# Patient Record
Sex: Male | Born: 1960 | Hispanic: No | Smoking: Former smoker
Health system: Southern US, Community
[De-identification: ages and names within clinical notes are randomized; demographics above are authoritative.]

## PROBLEM LIST (undated history)

## (undated) DIAGNOSIS — I10 Essential (primary) hypertension: Secondary | ICD-10-CM

## (undated) DIAGNOSIS — I313 Pericardial effusion (noninflammatory): Secondary | ICD-10-CM

## (undated) DIAGNOSIS — N529 Male erectile dysfunction, unspecified: Secondary | ICD-10-CM

## (undated) DIAGNOSIS — E785 Hyperlipidemia, unspecified: Secondary | ICD-10-CM

## (undated) DIAGNOSIS — E119 Type 2 diabetes mellitus without complications: Secondary | ICD-10-CM

## (undated) DIAGNOSIS — I3139 Other pericardial effusion (noninflammatory): Secondary | ICD-10-CM

## (undated) DIAGNOSIS — R911 Solitary pulmonary nodule: Secondary | ICD-10-CM

## (undated) DIAGNOSIS — K759 Inflammatory liver disease, unspecified: Secondary | ICD-10-CM

## (undated) DIAGNOSIS — Z720 Tobacco use: Secondary | ICD-10-CM

## (undated) DIAGNOSIS — D649 Anemia, unspecified: Secondary | ICD-10-CM

## (undated) DIAGNOSIS — D72829 Elevated white blood cell count, unspecified: Secondary | ICD-10-CM

## (undated) DIAGNOSIS — I2510 Atherosclerotic heart disease of native coronary artery without angina pectoris: Secondary | ICD-10-CM

## (undated) DIAGNOSIS — N186 End stage renal disease: Secondary | ICD-10-CM

## (undated) HISTORY — DX: Other pericardial effusion (noninflammatory): I31.39

## (undated) HISTORY — PX: APPENDECTOMY: SHX54

## (undated) HISTORY — DX: Anemia, unspecified: D64.9

## (undated) HISTORY — DX: Elevated white blood cell count, unspecified: D72.829

## (undated) HISTORY — DX: Solitary pulmonary nodule: R91.1

## (undated) HISTORY — DX: Atherosclerotic heart disease of native coronary artery without angina pectoris: I25.10

## (undated) HISTORY — DX: Pericardial effusion (noninflammatory): I31.3

---
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
## 2001-02-17 ENCOUNTER — Ambulatory Visit (HOSPITAL_COMMUNITY): Admission: RE | Admit: 2001-02-17 | Discharge: 2001-02-17 | Payer: Self-pay | Admitting: Family Medicine

## 2001-02-17 ENCOUNTER — Encounter: Payer: Self-pay | Admitting: Family Medicine

## 2009-05-09 ENCOUNTER — Ambulatory Visit: Payer: Self-pay | Admitting: Internal Medicine

## 2009-05-09 ENCOUNTER — Inpatient Hospital Stay (HOSPITAL_COMMUNITY): Admission: EM | Admit: 2009-05-09 | Discharge: 2009-05-13 | Payer: Self-pay | Admitting: Emergency Medicine

## 2009-05-09 ENCOUNTER — Ambulatory Visit: Payer: Self-pay | Admitting: *Deleted

## 2009-05-09 ENCOUNTER — Encounter (INDEPENDENT_AMBULATORY_CARE_PROVIDER_SITE_OTHER): Payer: Self-pay | Admitting: Internal Medicine

## 2009-05-10 ENCOUNTER — Encounter: Payer: Self-pay | Admitting: Internal Medicine

## 2009-05-12 HISTORY — PX: CARDIAC CATHETERIZATION: SHX172

## 2009-05-13 ENCOUNTER — Encounter: Payer: Self-pay | Admitting: Internal Medicine

## 2009-05-20 ENCOUNTER — Ambulatory Visit: Payer: Self-pay | Admitting: Internal Medicine

## 2009-05-20 ENCOUNTER — Encounter: Payer: Self-pay | Admitting: Internal Medicine

## 2009-05-20 DIAGNOSIS — K5900 Constipation, unspecified: Secondary | ICD-10-CM | POA: Insufficient documentation

## 2009-05-20 DIAGNOSIS — I10 Essential (primary) hypertension: Secondary | ICD-10-CM

## 2009-05-20 DIAGNOSIS — F17200 Nicotine dependence, unspecified, uncomplicated: Secondary | ICD-10-CM | POA: Insufficient documentation

## 2009-05-20 DIAGNOSIS — E785 Hyperlipidemia, unspecified: Secondary | ICD-10-CM | POA: Insufficient documentation

## 2009-05-22 DIAGNOSIS — E1129 Type 2 diabetes mellitus with other diabetic kidney complication: Secondary | ICD-10-CM

## 2009-06-23 ENCOUNTER — Encounter: Payer: Self-pay | Admitting: Internal Medicine

## 2009-06-23 ENCOUNTER — Ambulatory Visit: Payer: Self-pay | Admitting: Internal Medicine

## 2009-06-23 DIAGNOSIS — F528 Other sexual dysfunction not due to a substance or known physiological condition: Secondary | ICD-10-CM

## 2009-06-23 LAB — CONVERTED CEMR LAB: Blood Glucose, Fingerstick: 141

## 2009-06-24 LAB — CONVERTED CEMR LAB
BUN: 20 mg/dL (ref 6–23)
CO2: 21 meq/L (ref 19–32)
Calcium: 9.7 mg/dL (ref 8.4–10.5)
Chloride: 106 meq/L (ref 96–112)
Cholesterol: 224 mg/dL — ABNORMAL HIGH (ref 0–200)
Creatinine, Ser: 1.15 mg/dL (ref 0.40–1.50)
Creatinine, Urine: 251.5 mg/dL
Glucose, Bld: 169 mg/dL — ABNORMAL HIGH (ref 70–99)
HDL: 30 mg/dL — ABNORMAL LOW (ref >39)
Microalb Creat Ratio: 22 mg/g (ref 0.0–30.0)
Microalb, Ur: 5.54 mg/dL — ABNORMAL HIGH (ref 0.00–1.89)
Potassium: 4.2 meq/L (ref 3.5–5.3)
Sodium: 136 meq/L (ref 135–145)
Testosterone: 661.63 ng/dL (ref 350–890)
Total CHOL/HDL Ratio: 7.5
Triglycerides: 407 mg/dL — ABNORMAL HIGH (ref <150)

## 2010-12-06 DIAGNOSIS — N529 Male erectile dysfunction, unspecified: Secondary | ICD-10-CM

## 2010-12-06 DIAGNOSIS — Z720 Tobacco use: Secondary | ICD-10-CM

## 2010-12-06 DIAGNOSIS — E785 Hyperlipidemia, unspecified: Secondary | ICD-10-CM

## 2010-12-06 HISTORY — DX: Male erectile dysfunction, unspecified: N52.9

## 2010-12-06 HISTORY — DX: Tobacco use: Z72.0

## 2010-12-06 HISTORY — DX: Hyperlipidemia, unspecified: E78.5

## 2011-03-14 LAB — GLUCOSE, CAPILLARY: Glucose-Capillary: 141 mg/dL — ABNORMAL HIGH (ref 70–99)

## 2011-03-15 LAB — GLUCOSE, CAPILLARY
Glucose-Capillary: 142 mg/dL — ABNORMAL HIGH (ref 70–99)
Glucose-Capillary: 168 mg/dL — ABNORMAL HIGH (ref 70–99)
Glucose-Capillary: 169 mg/dL — ABNORMAL HIGH (ref 70–99)
Glucose-Capillary: 214 mg/dL — ABNORMAL HIGH (ref 70–99)
Glucose-Capillary: 214 mg/dL — ABNORMAL HIGH (ref 70–99)
Glucose-Capillary: 219 mg/dL — ABNORMAL HIGH (ref 70–99)
Glucose-Capillary: 238 mg/dL — ABNORMAL HIGH (ref 70–99)
Glucose-Capillary: 260 mg/dL — ABNORMAL HIGH (ref 70–99)
Glucose-Capillary: 277 mg/dL — ABNORMAL HIGH (ref 70–99)
Glucose-Capillary: 297 mg/dL — ABNORMAL HIGH (ref 70–99)

## 2011-03-15 LAB — CBC
HCT: 46.3 % (ref 39.0–52.0)
Hemoglobin: 16 g/dL (ref 13.0–17.0)
MCHC: 34.5 g/dL (ref 30.0–36.0)
Platelets: 235 10*3/uL (ref 150–400)
RBC: 5.13 MIL/uL (ref 4.22–5.81)
RBC: 5.16 MIL/uL (ref 4.22–5.81)
RBC: 5.34 MIL/uL (ref 4.22–5.81)
RDW: 13 % (ref 11.5–15.5)
WBC: 9.7 10*3/uL (ref 4.0–10.5)
WBC: 9.8 10*3/uL (ref 4.0–10.5)
WBC: 9.8 10*3/uL (ref 4.0–10.5)

## 2011-03-15 LAB — HEPATITIS B SURFACE ANTIBODY,QUALITATIVE: Hep B S Ab: NEGATIVE

## 2011-03-15 LAB — BASIC METABOLIC PANEL
BUN: 10 mg/dL (ref 6–23)
BUN: 12 mg/dL (ref 6–23)
CO2: 23 mEq/L (ref 19–32)
Calcium: 8.9 mg/dL (ref 8.4–10.5)
Calcium: 9 mg/dL (ref 8.4–10.5)
Calcium: 9.3 mg/dL (ref 8.4–10.5)
Calcium: 9.4 mg/dL (ref 8.4–10.5)
Chloride: 98 mEq/L (ref 96–112)
Creatinine, Ser: 0.71 mg/dL (ref 0.4–1.5)
Creatinine, Ser: 0.86 mg/dL (ref 0.4–1.5)
Creatinine, Ser: 0.89 mg/dL (ref 0.4–1.5)
Creatinine, Ser: 0.89 mg/dL (ref 0.4–1.5)
GFR calc Af Amer: >60 mL/min (ref >60)
GFR calc Af Amer: >60 mL/min (ref >60)
GFR calc non Af Amer: >60 mL/min (ref >60)
GFR calc non Af Amer: >60 mL/min (ref >60)
Glucose, Bld: 209 mg/dL — ABNORMAL HIGH (ref 70–99)
Glucose, Bld: 316 mg/dL — ABNORMAL HIGH (ref 70–99)
Potassium: 4.1 mEq/L (ref 3.5–5.1)
Sodium: 131 mEq/L — ABNORMAL LOW (ref 135–145)
Sodium: 141 mEq/L (ref 135–145)

## 2011-03-15 LAB — HEPATITIS C ANTIBODY: HCV Ab: NEGATIVE

## 2011-03-15 LAB — CARDIAC PANEL(CRET KIN+CKTOT+MB+TROPI)
CK, MB: 2.2 ng/mL (ref 0.3–4.0)
CK, MB: 2.8 ng/mL (ref 0.3–4.0)
Relative Index: 2 (ref 0.0–2.5)
Total CK: 115 U/L (ref 7–232)

## 2011-03-15 LAB — HEPATIC FUNCTION PANEL
ALT: 20 U/L (ref 0–53)
Alkaline Phosphatase: 81 U/L (ref 39–117)
Bilirubin, Direct: 0.3 mg/dL (ref 0.0–0.3)
Indirect Bilirubin: 0.3 mg/dL (ref 0.3–0.9)

## 2011-03-15 LAB — CK TOTAL AND CKMB (NOT AT ARMC)
CK, MB: 4 ng/mL (ref 0.3–4.0)
Relative Index: 2 (ref 0.0–2.5)

## 2011-03-15 LAB — DIFFERENTIAL
Basophils Absolute: 0.1 10*3/uL (ref 0.0–0.1)
Basophils Relative: 1 % (ref 0–1)
Eosinophils Relative: 5 % (ref 0–5)
Lymphocytes Relative: 33 % (ref 12–46)
Lymphs Abs: 2.6 10*3/uL (ref 0.7–4.0)
Monocytes Relative: 6 % (ref 3–12)
Neutro Abs: 6.1 10*3/uL (ref 1.7–7.7)
Neutrophils Relative %: 59 % (ref 43–77)
Neutrophils Relative %: 62 % (ref 43–77)

## 2011-03-15 LAB — LIPASE, BLOOD: Lipase: 47 U/L (ref 11–59)

## 2011-03-15 LAB — LIPID PANEL
HDL: 21 mg/dL — ABNORMAL LOW (ref >39)
Total CHOL/HDL Ratio: 11.3 RATIO
Triglycerides: 301 mg/dL — ABNORMAL HIGH (ref <150)
VLDL: 60 mg/dL — ABNORMAL HIGH (ref 0–40)

## 2011-03-15 LAB — TROPONIN I: Troponin I: 0.02 ng/mL (ref 0.00–0.06)

## 2011-04-14 ENCOUNTER — Encounter: Payer: Self-pay | Admitting: Internal Medicine

## 2011-04-20 NOTE — Consult Note (Signed)
Fernando Jones, Fernando Jones NO.:  [id number]   MEDICAL RECORD NO.:  [id number]          PATIENT TYPE:  INP   LOCATION:  4702                         FACILITY:  [facility]   PHYSICIAN:  Deboraha Sprang, MD, FACCDATE OF BIRTH:  11-Mar-1961   DATE OF CONSULTATION:  05/10/2009  DATE OF DISCHARGE:                                 CONSULTATION   REASON FOR CONSULTATION:  Chest pain.   PRIMARY CARDIOLOGIST:  Will be new, Deboraha Sprang, MD, [facility]   PRIMARY CARE PHYSICIAN:  The patient does not have one.   HISTORY OF PRESENT ILLNESS:  This is a 50 year old [place] male with no  prior history of CAD but with a history of hypertension and diabetes  along with an episode of hepatitis as a child, who was admitted through  family practice teaching service with complaints of chest pain, which  awakened him.  He described it as constant pressure, worse with  palpation, midsternal and epigastric   Over the 2 months, he has been noticing increasing dyspnea on exertion,  walking up to his third floor apartment.  He usually does not have  problems doing so but over the last couple of weeks, he has been.  He is  also admitted to not sleeping well and awakens feeling shaky and this  has been going on for about 4 days.  He is feeling a lot more tired over  the last few days, sleeping a lot, and feels like he is unstable on his  feet.   At this time, the patient is without complaints of chest pain.  He is on  nitroglycerin and heparin.  He just feels exhausted.  He continues to  sleep a lot.  On further evaluation, the patient admits to snoring and  his fiancee, who lives with him, thinks that he has stopped breathing  sometimes.  At this time, the patient is stable.   REVIEW OF SYSTEMS:  Positive for chest pain, shortness of breath,  snoring and generalized fatigue, weakness, and photophobia.  All other  systems reviewed and found to be negative.   PAST MEDICAL HISTORY:  Hypertension x8  years, diabetes x8 years,  hepatitis at age 58, diabetic neuropathy symptoms.  Echocardiogram  completed during this admission reveals an EF of 55-60% with normal wall  thickness   PAST SURGICAL HISTORY:  Appendectomy.   SOCIAL HISTORY:  He lives in [place] with his fiancee.  He repairs  computers.  His daughter is an MD in [place].  He is a 40-pack year  smoker, currently at one and one-half packs a day.  Occasional alcohol.  No drug use.   FAMILY HISTORY:  Mother deceased from complications of CHF.  He has a  father in good health and siblings with diabetes.   CURRENT MEDICATIONS:  1. Aspirin 325 daily.  2. Protonix 40 mg daily.  3. Lipitor 40 mg daily.  4. Lantus insulin 5 units daily.  5. Metoprolol 25 mg b.i.d.  6. Heparin and nitroglycerin drip.  At home the patient is taking:  1. Captopril 20 mg a day.  2. Glipizide 5  mg a day.  3. Metformin 850 mg daily.   ALLERGIES:  No known drug allergies.   CURRENT LABORATORY DATA:  Troponin 0.02, 0.02, and 0.01 respectively.  Sodium 136, potassium 4.0, chloride 105, CO2 23, BUN 13, creatinine  0.71, glucose 261.  Hemoglobin 15.4, hematocrit 44.8, white blood cells  9.8, platelets 237.  TSH 1.387.  Hemoglobin A1c 10.1.  Total cholesterol  238, triglycerides 301, HDL 21, LDL 157.   CARDIOVASCULAR RISK FACTORS:  1. Diabetes.  2. Hypertension.  3. Cholesterol family history.  4. Ongoing smoking.   EKG revealing normal sinus rhythm with T-wave flattening in V1, V5, V6,  and aVF, rate of 75 beats per minute, normal sinus rhythm.  Chest x-ray no acute cardiopulmonary process.   PHYSICAL EXAMINATION:  VITAL SIGNS:  Blood pressure 126/85, pulse 68,  respirations 20, temperature 98.2, O2 sat 96% on room air.  GENERAL:  He is awake, alert, and oriented.  Affect is pleasant.  HEENT:  Head is normocephalic and atraumatic.  Eyes, PERRLA.  Mucous  membranes mouth pink and moist.  Tongue is midline.  NECK:  Supple.  No JVD.  No  carotid bruits appreciated.  CARDIOVASCULAR:  Regular rate and rhythm without murmurs, rubs, or  gallops.  Pulses are 2+ and equal without bruits, femoral and radial.  LUNGS:  Clear to auscultation without wheezes, rales, or rhonchi.  ABDOMEN:  Soft, nontender, 2+ bowel sounds.  EXTREMITIES:  Without clubbing, cyanosis, or edema.  NEUROLOGIC:  Cranial nerves II through XII are grossly intact.   IMPRESSION:  1. Chest pain with multiple cardiovascular risk factors, rule out      coronary artery disease.  2. Diabetes, now well controlled.  3. Hypertension.  4. Rule out sleep apnea.  5. Hypercholesterolemia.   PLAN:  This is a 50 year old [place] male with known history of  diabetes, hypertension with multiple cardiovascular risk factors,  admitted with chest pain and generalized weakness, fatigue, and  shortness of breath with exertion.  EKG reveals lateral T-wave  flattening, but he does have normal cardiac enzymes.  Echocardiogram  revealing normal LV function.   The patient has been seen and examined by myself and Dr. Virl Axe.  The patient has a high pretest probability, so we think a cardiac  catheterization is more suitable than noninvasive imaging.  I have  reviewed this with the patient to include risks and benefits, and he  agrees to proceed.  We will have more intensive treatment for diabetes  through primary care for long-term treatment.   Our plan for cardiac catheterization will be completed on Monday, May 12, 2009.  We would recommend sleep study, ACE inhibitor for renal  protection, and blood pressure control.   On behalf the physicians and providers of [facility], we would  like to thank family practice teaching service for allowing [place] to  participate in the care of this patient.      Phill Myron. Purcell Nails, NP      Deboraha Sprang, MD, [facility]  Electronically Signed    KML/MEDQ  D:  05/10/2009  T:  05/11/2009  Job:  [id number]

## 2011-04-20 NOTE — Cardiovascular Report (Signed)
NAMESTANDLY, SHAMES NO.:  [id number]   MEDICAL RECORD NO.:  [id number]          PATIENT TYPE:  INP   LOCATION:  4702                         FACILITY:  [facility]   PHYSICIAN:  Shaune Pascal. Bensimhon, MDDATE OF BIRTH:  1961-11-04   DATE OF PROCEDURE:  05/12/2009  DATE OF DISCHARGE:                            CARDIAC CATHETERIZATION   INDICATIONS:  [name] is a 50 year old male with a history of  hepatitis, hypertension, diabetes, and ongoing tobacco use.  He was  admitted with chest pain.  His EKG had some mild T-wave flattening.  His  cardiac enzymes have been normal.  He was seen in consult by Dr. Caryl Comes  and referred for catheterization.   PROCEDURES PERFORMED:  1. Selective coronary angiography.  2. Left heart catheterization.  3. Left ventriculogram.  4. Abdominal aortogram.  5. Attempted StarClose femoral artery closure, which failed.   DESCRIPTION OF PROCEDURE:  The risks and indication of catheterization  were explained.  Consent was signed and placed on the chart.  A 5-French  arterial sheath was placed in the right femoral artery using a modified  Seldinger technique.  Standard catheters including a JL-4 and JR-4  angled pigtail were used for procedure.  All catheter exchanges were  made over the wire.  There were no apparent complications.  At the end  of the procedure, we attempted to deploy a StarClose femoral artery  closure device, this failed.  He did develop hematoma in the area and  manual pressure was held.  There was good hemostasis.   Central aortic pressure was 131/73 with a mean of 97.  LV pressure 132/2  and EDP of 5.  There was no aortic stenosis on pullback across the  valve.   Left main was normal.   LAD was a long vessel coursing to the apex.  It gave off two diagonal  branches.  In the ostial LAD, there was approximately 40% focal  stenosis.  In the mid LAD between the two diagonal branches, there was a  50% stenosis.   Left  circumflex gave off a tiny OM 1 and moderate-sized OM 2 and OM 3.  There was also a small ramus branch.  It was angiographically normal.   Right coronary artery was a large dominant vessel, a large PDA and two  posterolaterals that was angiographically normal.   Left ventriculogram done in the RAO position showed an EF of 65% with no  regional wall motion abnormalities.   Abdominal aortogram was normal.  There was no evidence of aneurysmal  dilatation.  The renal arteries were widely patent bilaterally.   ASSESSMENT:  1. Nonobstructive coronary artery disease in the left anterior      descending, otherwise normal coronary arteries.  2. Normal left ventricular function.  3. Normal abdominal aorta and renal arteries.  4. Failed StarClose femoral artery closure device with resultant      hematoma, which was controlled with manual pressure.   Plan will be for medical therapy and continued risk factor management.  He will need to stop smoking.  We will watch his groin closely.  If  groin is stable throughout the day, he can go home either later today or  in the morning.      Shaune Pascal. Bensimhon, MD  Electronically Signed     DRB/MEDQ  D:  05/12/2009  T:  05/13/2009  Job:  [id number]

## 2011-04-20 NOTE — Discharge Summary (Signed)
Fernando Jones, Fernando Jones NO.:  [id number]   MEDICAL RECORD NO.:  [id number]          PATIENT TYPE:  OBV   LOCATION:  [id number]                         FACILITY:  [facility] [facility]   PHYSICIAN:  Felicity Pellegrini, MD     DATE OF BIRTH:  [birth year]   DATE OF ADMISSION:  05/09/2009  DATE OF DISCHARGE:  05/13/2009                               DISCHARGE SUMMARY   DISCHARGE DIAGNOSES:  1. Chest pain.  2. Uncontrolled diabetes.  3. Hypertension.  4. Tobacco abuse.  5. Hyperlipidemia.   DISCHARGE MEDICATIONS:  1. Glipizide 10 mg 1 tablet p.o. b.i.d.  2. Metformin 1000 mg 1 tablet p.o. b.i.d.  3. Lisinopril and hydrochlorothiazide 10/12.5 mg 1 tablet p.o. daily.  4. Pravastatin 40 mg 1 tablet p.o. daily.   CONDITION ON DISCHARGE:  The patient is medically stable to be  discharged.   DISCHARGE INSTRUCTIONS:  The patient will follow up at [facility]  on May 20, 2009 at 3:00 p.m. to see Dr. Alfonse Alpers for a hospital  followup.  The patient should have diabetes education with [facility].  Management of diabetes and hypertension is the focus of followup  hospitalization visit.  BMET and fasting lipid profile should be done in  6 weeks.  The patient would also benefit from appointment with [facility] for some type of health coverage.   PROCEDURES:  1. 2-D echo.  Impression:  Left ventricle size was normal.  Left      ventricle wall thickness was normal.  Systolic function was normal.      Ejection fraction was 55-60%.  2. Chest x-ray, May 09, 2009.  Impression:  No acute cardiopulmonary      abnormality.  3. Abdominal x-ray.  Impression:  Somewhat prominent stool in the      colon.  Otherwise bowel gas pattern is normal.  4. Cardiac catheterization.  Impression:  Nonobstructive coronary      artery disease.  Normal left ventricle.  Normal aortic and renal      arteries.  For more detail, see cardiologist's assessment.   CONSULTATION:  Deboraha Sprang, MD, [facility].   ADMITTING HISTORY AND PHYSICAL:  A 50 year old man with history of  diabetes and hypertension comes to the ED complaining of chest pain.  Chest pain is located at the level of xiphoid process.  Described as  constant and pressure-like.  Associated with dizziness, diaphoresis,  feeling pale, palpitations, and nausea.  The patient had recurrent  symptoms for the last 4 days, which lasts minutes at a time.  The  patient has also felt more fatigued and states the symptoms have been  experienced while sleeping and symptoms wake him up.  The patient  becomes shaky after episodes.  The patient also reports having black  stools for several months.  Denies weight loss, change in stool caliber  or consistency.   PHYSICAL EXAMINATION:  VITAL SIGNS:  Temperature 98.1, blood pressure  130/68, pulse 96, respiratory rate 16, and O2 saturation 99% on 2 L.  GENERAL:  Obese and in no acute distress.  EYES:  EOMI.  Pupils equal, reactive,  and round to light and  accommodation.  ENT:  Moist mucous membranes.  NECK:  No JVD, supple.  RESPIRATORY:  Clear to auscultation bilaterally.  CARDIOVASCULAR:  Regular rate and rhythm.  No murmurs, rubs, or gallops.  Tender to palpation over the xiphoid process, normal PMI.  GI:  Bowel sounds positive, soft and depressible, nontender, no rebound,  and no guarding.  EXTREMITIES:  No edema.  RECTAL:  Normal tone, no hemorrhoids, brown stool, guaiac negative.  MUSCULOSKELETAL:  5/5 strength throughout.  NEUROLOGIC:  Alert and oriented x3, nonfocal.  Some distal neuropathy.   ADMISSION LABORATORY DATA:  Sodium 131, potassium 4.1, chloride 98,  bicarb 20, BUN 12, creatinine 0.89, and glucose 316.  White blood cells  9.8, hemoglobin 16, hematocrit 46, and platelets 251.   HOSPITAL COURSE:  1. Chest pain.  Due to concerns for ACS, the patient was admitted.      The patient has multiple risk factors which include hypertension,      uncontrolled diabetes, smoking,  first-degree relative with early      CAD, and hyperlipidemia.  The patient had cardiac enzymes that were      negative x3.  EKG showed nonsignificant ST elevation.  Cardiology      was consulted for further assessment.  The patient was      catheterized.  Results of cath showed that the patient had      nonobstructive CAD.  Cardiology recommended medical therapy and      risk factor reduction.  After procedure, the patient was watched      for 24 more hours.  The patient did not develop at the site of      procedure hematoma.  The patient will follow up at [facility] which at that time will work on his back to reduction      such as diabetes management, hyperlipidemia, and hypertension      control.  A 2-D echo as described above.  2. Diabetes.  The patient's hemoglobin A1c was 10 while in the      hospital.  Thus, the patient's diabetes is not being well      controlled.  The patient was educated of risk factors of      uncontrolled diabetes.  The patient during the hospitalization had      blood glucose levels ranging from 200-300.  The patient will be      started on increased regimen of oral medication.  Will follow up at      [facility] and further management of diabetes will be      done.  Educated the patient that he also had to change his diet.      The patient understands that there might be a possibility of      starting insulin and good control of diabetes cannot be done with      diet and oral medication.  3. Hypertension.  The patient's hypertension was overall at goal      during hospitalization.  There were sometimes where the patient's      blood pressure was a little over goal of 130/80.  The patient will      be started on combination medication of lisinopril and      hydrochlorothiazide.  Further monitoring will be done at [facility].  The patient will need a followup BMET to monitor  any electrolyte  abnormalities in 6 weeks.  4. Melena.  The patient describes of having some black stools.  The      patient was guaiac negative.  Hemodynamically stable throughout      hospitalization.  No signs of active bleeding during      hospitalization.  The patient does not have a family history of      colon cancer.  The patient will need at some point age-appropriate      colon cancer screening.  Will continue to follow up as an      outpatient at [facility].  5. Hyperlipidemia.  The patient was started on statin while on the      hospitalization.  The patient did have a LDL that was above goal      for a diabetic.  The patient will be started on pravastatin 40 mg      daily as an outpatient.  He will need fasting liver profile in 6      weeks to further evaluate.  We will monitor closely at [facility].  6. Tobacco abuse.  Smoking cessation counseling was done during the      hospitalization.  The patient was receptive to lowering risk      factors for heart attack and thus was receptive to smoking      cessation.  The patient stated that he will continue Nicoderm patch      as an outpatient.  We will follow up and continue to encourage as      an outpatient.   DISCHARGE VITAL SIGNS:  Temperature 98.3, blood pressure 126/79, pulse  75, respiratory rate 18, and oxygen saturation 97 on room air.   DISCHARGE LABORATORY DATA:  Sodium 139, potassium 4.0, chloride 110,  bicarb 26, BUN 11, creatinine 0.86, and glucose 240.  White blood cells  9.7, hemoglobin 15.2, hematocrit 44.3, and platelets 235.      Ludwig Lean, MD  Electronically Signed      Felicity Pellegrini, MD     RV/MEDQ  D:  05/13/2009  T:  05/14/2009  Job:  [id number]

## 2011-07-03 ENCOUNTER — Emergency Department (HOSPITAL_COMMUNITY): Payer: Self-pay

## 2011-07-03 ENCOUNTER — Emergency Department (HOSPITAL_COMMUNITY)
Admission: EM | Admit: 2011-07-03 | Discharge: 2011-07-03 | Disposition: A | Discharge status: Home / Self Care | Payer: Self-pay | Attending: Emergency Medicine | Admitting: Emergency Medicine

## 2011-07-03 DIAGNOSIS — I252 Old myocardial infarction: Secondary | ICD-10-CM | POA: Insufficient documentation

## 2011-07-03 DIAGNOSIS — K5900 Constipation, unspecified: Secondary | ICD-10-CM | POA: Insufficient documentation

## 2011-07-03 DIAGNOSIS — R1032 Left lower quadrant pain: Secondary | ICD-10-CM | POA: Insufficient documentation

## 2011-07-03 DIAGNOSIS — I10 Essential (primary) hypertension: Secondary | ICD-10-CM | POA: Insufficient documentation

## 2011-07-03 DIAGNOSIS — Z79899 Other long term (current) drug therapy: Secondary | ICD-10-CM | POA: Insufficient documentation

## 2011-07-03 DIAGNOSIS — E119 Type 2 diabetes mellitus without complications: Secondary | ICD-10-CM | POA: Insufficient documentation

## 2011-07-03 DIAGNOSIS — R51 Headache: Secondary | ICD-10-CM | POA: Insufficient documentation

## 2011-07-03 DIAGNOSIS — R209 Unspecified disturbances of skin sensation: Secondary | ICD-10-CM | POA: Insufficient documentation

## 2011-07-03 LAB — COMPREHENSIVE METABOLIC PANEL
ALT: 17 U/L (ref 0–53)
Alkaline Phosphatase: 76 U/L (ref 39–117)
CO2: 24 mEq/L (ref 19–32)
Calcium: 9.2 mg/dL (ref 8.4–10.5)
GFR calc Af Amer: >60 mL/min (ref >60)
GFR calc non Af Amer: >60 mL/min (ref >60)
Glucose, Bld: 419 mg/dL — ABNORMAL HIGH (ref 70–99)
Potassium: 4.1 mEq/L (ref 3.5–5.1)
Sodium: 132 mEq/L — ABNORMAL LOW (ref 135–145)

## 2011-07-03 LAB — CBC
HCT: 40.5 % (ref 39.0–52.0)
Hemoglobin: 14.7 g/dL (ref 13.0–17.0)
MCHC: 36.3 g/dL — ABNORMAL HIGH (ref 30.0–36.0)
MCV: 83 fL (ref 78.0–100.0)

## 2011-07-03 LAB — DIFFERENTIAL
Basophils Absolute: 0.1 10*3/uL (ref 0.0–0.1)
Basophils Relative: 1 % (ref 0–1)
Eosinophils Absolute: 0.2 10*3/uL (ref 0.0–0.7)
Monocytes Absolute: 0.6 10*3/uL (ref 0.1–1.0)
Neutro Abs: 6.3 10*3/uL (ref 1.7–7.7)
Neutrophils Relative %: 69 % (ref 43–77)

## 2011-07-03 LAB — GLUCOSE, CAPILLARY: Glucose-Capillary: 357 mg/dL — ABNORMAL HIGH (ref 70–99)

## 2016-10-12 ENCOUNTER — Emergency Department (HOSPITAL_COMMUNITY): Payer: Self-pay

## 2016-10-12 ENCOUNTER — Encounter (HOSPITAL_COMMUNITY): Payer: Self-pay | Admitting: Emergency Medicine

## 2016-10-12 ENCOUNTER — Inpatient Hospital Stay (HOSPITAL_COMMUNITY)
Admission: EM | Admit: 2016-10-12 | Discharge: 2016-10-17 | DRG: 291 | Disposition: A | Discharge status: Home / Self Care | Payer: Self-pay | Attending: Family Medicine | Admitting: Family Medicine

## 2016-10-12 DIAGNOSIS — E1165 Type 2 diabetes mellitus with hyperglycemia: Secondary | ICD-10-CM | POA: Diagnosis present

## 2016-10-12 DIAGNOSIS — T383X6A Underdosing of insulin and oral hypoglycemic [antidiabetic] drugs, initial encounter: Secondary | ICD-10-CM | POA: Diagnosis present

## 2016-10-12 DIAGNOSIS — I130 Hypertensive heart and chronic kidney disease with heart failure and stage 1 through stage 4 chronic kidney disease, or unspecified chronic kidney disease: Principal | ICD-10-CM | POA: Diagnosis present

## 2016-10-12 DIAGNOSIS — N184 Chronic kidney disease, stage 4 (severe): Secondary | ICD-10-CM | POA: Diagnosis present

## 2016-10-12 DIAGNOSIS — T466X6A Underdosing of antihyperlipidemic and antiarteriosclerotic drugs, initial encounter: Secondary | ICD-10-CM | POA: Diagnosis present

## 2016-10-12 DIAGNOSIS — E8770 Fluid overload, unspecified: Secondary | ICD-10-CM

## 2016-10-12 DIAGNOSIS — E46 Unspecified protein-calorie malnutrition: Secondary | ICD-10-CM | POA: Diagnosis present

## 2016-10-12 DIAGNOSIS — T464X6A Underdosing of angiotensin-converting-enzyme inhibitors, initial encounter: Secondary | ICD-10-CM | POA: Diagnosis present

## 2016-10-12 DIAGNOSIS — E1129 Type 2 diabetes mellitus with other diabetic kidney complication: Secondary | ICD-10-CM | POA: Diagnosis present

## 2016-10-12 DIAGNOSIS — D649 Anemia, unspecified: Secondary | ICD-10-CM | POA: Diagnosis present

## 2016-10-12 DIAGNOSIS — Y92009 Unspecified place in unspecified non-institutional (private) residence as the place of occurrence of the external cause: Secondary | ICD-10-CM

## 2016-10-12 DIAGNOSIS — N529 Male erectile dysfunction, unspecified: Secondary | ICD-10-CM | POA: Diagnosis present

## 2016-10-12 DIAGNOSIS — Z91128 Patient's intentional underdosing of medication regimen for other reason: Secondary | ICD-10-CM

## 2016-10-12 DIAGNOSIS — E559 Vitamin D deficiency, unspecified: Secondary | ICD-10-CM | POA: Diagnosis present

## 2016-10-12 DIAGNOSIS — N179 Acute kidney failure, unspecified: Secondary | ICD-10-CM | POA: Diagnosis present

## 2016-10-12 DIAGNOSIS — J9601 Acute respiratory failure with hypoxia: Secondary | ICD-10-CM | POA: Diagnosis present

## 2016-10-12 DIAGNOSIS — E785 Hyperlipidemia, unspecified: Secondary | ICD-10-CM | POA: Diagnosis present

## 2016-10-12 DIAGNOSIS — F17200 Nicotine dependence, unspecified, uncomplicated: Secondary | ICD-10-CM | POA: Diagnosis present

## 2016-10-12 DIAGNOSIS — J9600 Acute respiratory failure, unspecified whether with hypoxia or hypercapnia: Secondary | ICD-10-CM

## 2016-10-12 DIAGNOSIS — Z9119 Patient's noncompliance with other medical treatment and regimen: Secondary | ICD-10-CM

## 2016-10-12 DIAGNOSIS — D631 Anemia in chronic kidney disease: Secondary | ICD-10-CM | POA: Diagnosis present

## 2016-10-12 DIAGNOSIS — E1122 Type 2 diabetes mellitus with diabetic chronic kidney disease: Secondary | ICD-10-CM | POA: Diagnosis present

## 2016-10-12 DIAGNOSIS — I5033 Acute on chronic diastolic (congestive) heart failure: Secondary | ICD-10-CM | POA: Diagnosis present

## 2016-10-12 DIAGNOSIS — R601 Generalized edema: Secondary | ICD-10-CM | POA: Diagnosis present

## 2016-10-12 DIAGNOSIS — I160 Hypertensive urgency: Secondary | ICD-10-CM | POA: Diagnosis present

## 2016-10-12 DIAGNOSIS — Z8249 Family history of ischemic heart disease and other diseases of the circulatory system: Secondary | ICD-10-CM

## 2016-10-12 DIAGNOSIS — I2510 Atherosclerotic heart disease of native coronary artery without angina pectoris: Secondary | ICD-10-CM | POA: Diagnosis present

## 2016-10-12 HISTORY — DX: Tobacco use: Z72.0

## 2016-10-12 HISTORY — DX: Male erectile dysfunction, unspecified: N52.9

## 2016-10-12 HISTORY — DX: Hyperlipidemia, unspecified: E78.5

## 2016-10-12 HISTORY — DX: Essential (primary) hypertension: I10

## 2016-10-12 LAB — CBC WITH DIFFERENTIAL/PLATELET
Basophils Absolute: 0.1 10*3/uL (ref 0.0–0.1)
Basophils Relative: 1 %
EOS PCT: 6 %
Eosinophils Absolute: 0.4 10*3/uL (ref 0.0–0.7)
HCT: 30.1 % — ABNORMAL LOW (ref 39.0–52.0)
Hemoglobin: 10.2 g/dL — ABNORMAL LOW (ref 13.0–17.0)
LYMPHS ABS: 1.1 10*3/uL (ref 0.7–4.0)
LYMPHS PCT: 14 %
MCH: 29.6 pg (ref 26.0–34.0)
MCHC: 33.9 g/dL (ref 30.0–36.0)
MCV: 87.2 fL (ref 78.0–100.0)
MONO ABS: 0.5 10*3/uL (ref 0.1–1.0)
Monocytes Relative: 7 %
Neutro Abs: 5.5 10*3/uL (ref 1.7–7.7)
Neutrophils Relative %: 72 %
PLATELETS: 336 10*3/uL (ref 150–400)
RBC: 3.45 MIL/uL — AB (ref 4.22–5.81)
RDW: 15.1 % (ref 11.5–15.5)
WBC: 7.7 10*3/uL (ref 4.0–10.5)

## 2016-10-12 LAB — COMPREHENSIVE METABOLIC PANEL
ALBUMIN: 3.1 g/dL — AB (ref 3.5–5.0)
ALK PHOS: 114 U/L (ref 38–126)
ALT: 23 U/L (ref 17–63)
AST: 27 U/L (ref 15–41)
Anion gap: 8 (ref 5–15)
BUN: 55 mg/dL — ABNORMAL HIGH (ref 6–20)
CALCIUM: 8.2 mg/dL — AB (ref 8.9–10.3)
CO2: 19 mmol/L — AB (ref 22–32)
CREATININE: 4.17 mg/dL — AB (ref 0.61–1.24)
Chloride: 109 mmol/L (ref 101–111)
GFR calc Af Amer: 17 mL/min — ABNORMAL LOW (ref >60)
GFR calc non Af Amer: 15 mL/min — ABNORMAL LOW (ref >60)
GLUCOSE: 199 mg/dL — AB (ref 65–99)
Potassium: 4.6 mmol/L (ref 3.5–5.1)
SODIUM: 136 mmol/L (ref 135–145)
Total Bilirubin: 1.2 mg/dL (ref 0.3–1.2)
Total Protein: 6.2 g/dL — ABNORMAL LOW (ref 6.5–8.1)

## 2016-10-12 LAB — URINE MICROSCOPIC-ADD ON

## 2016-10-12 LAB — URINALYSIS, ROUTINE W REFLEX MICROSCOPIC
Bilirubin Urine: NEGATIVE
GLUCOSE, UA: 100 mg/dL — AB
Ketones, ur: NEGATIVE mg/dL
Leukocytes, UA: NEGATIVE
Nitrite: NEGATIVE
PH: 6 (ref 5.0–8.0)
Specific Gravity, Urine: 1.018 (ref 1.005–1.030)

## 2016-10-12 LAB — CBG MONITORING, ED: Glucose-Capillary: 124 mg/dL — ABNORMAL HIGH (ref 65–99)

## 2016-10-12 LAB — TROPONIN I: Troponin I: 0.04 ng/mL (ref <0.03)

## 2016-10-12 LAB — BRAIN NATRIURETIC PEPTIDE: B Natriuretic Peptide: 1356.1 pg/mL — ABNORMAL HIGH (ref 0.0–100.0)

## 2016-10-12 MED ORDER — SODIUM CHLORIDE 0.9% FLUSH
3.0000 mL | Freq: Two times a day (BID) | INTRAVENOUS | Status: DC
  Administered 2016-10-13 – 2016-10-16 (×7): 3 mL via INTRAVENOUS

## 2016-10-12 MED ORDER — FUROSEMIDE 10 MG/ML IJ SOLN
80.0000 mg | Freq: Three times a day (TID) | INTRAMUSCULAR | Status: DC
  Administered 2016-10-13 – 2016-10-14 (×4): 80 mg via INTRAVENOUS
  Filled 2016-10-12 (×4): qty 8

## 2016-10-12 MED ORDER — NITROGLYCERIN 0.4 MG SL SUBL
0.4000 mg | SUBLINGUAL_TABLET | SUBLINGUAL | Status: DC | PRN
  Administered 2016-10-12: 0.4 mg via SUBLINGUAL
  Filled 2016-10-12: qty 1

## 2016-10-12 MED ORDER — ASPIRIN EC 81 MG PO TBEC
81.0000 mg | DELAYED_RELEASE_TABLET | Freq: Every day | ORAL | Status: DC
  Administered 2016-10-13 – 2016-10-17 (×5): 81 mg via ORAL
  Filled 2016-10-12 (×6): qty 1

## 2016-10-12 MED ORDER — ACETAMINOPHEN 650 MG RE SUPP: 650.0000 mg | Freq: Four times a day (QID) | RECTAL | Status: DC | PRN

## 2016-10-12 MED ORDER — INSULIN ASPART 100 UNIT/ML ~~LOC~~ SOLN
0.0000 [IU] | Freq: Three times a day (TID) | SUBCUTANEOUS | Status: DC
  Administered 2016-10-14: 2 [IU] via SUBCUTANEOUS

## 2016-10-12 MED ORDER — ACETAMINOPHEN 325 MG PO TABS
650.0000 mg | ORAL_TABLET | Freq: Four times a day (QID) | ORAL | Status: DC | PRN
  Administered 2016-10-13 (×2): 650 mg via ORAL
  Filled 2016-10-12 (×2): qty 2

## 2016-10-12 MED ORDER — HYDRALAZINE HCL 25 MG PO TABS
25.0000 mg | ORAL_TABLET | Freq: Three times a day (TID) | ORAL | Status: DC
  Administered 2016-10-12 – 2016-10-17 (×15): 25 mg via ORAL
  Filled 2016-10-12 (×16): qty 1

## 2016-10-12 MED ORDER — ZOLPIDEM TARTRATE 5 MG PO TABS
5.0000 mg | ORAL_TABLET | Freq: Once | ORAL | Status: AC
  Administered 2016-10-13: 5 mg via ORAL
  Filled 2016-10-12: qty 1

## 2016-10-12 MED ORDER — ASPIRIN 81 MG PO CHEW
324.0000 mg | CHEWABLE_TABLET | Freq: Once | ORAL | Status: AC
  Administered 2016-10-12: 324 mg via ORAL
  Filled 2016-10-12: qty 4

## 2016-10-12 MED ORDER — NITROGLYCERIN IN D5W 200-5 MCG/ML-% IV SOLN
0.0000 ug/min | INTRAVENOUS | Status: DC
  Administered 2016-10-12: 5 ug/min via INTRAVENOUS
  Filled 2016-10-12: qty 250

## 2016-10-12 MED ORDER — ONDANSETRON HCL 4 MG/2ML IJ SOLN: 4.0000 mg | Freq: Four times a day (QID) | INTRAMUSCULAR | Status: DC | PRN

## 2016-10-12 MED ORDER — FUROSEMIDE 10 MG/ML IJ SOLN
80.0000 mg | Freq: Once | INTRAMUSCULAR | Status: AC
  Administered 2016-10-12: 80 mg via INTRAVENOUS
  Filled 2016-10-12: qty 8

## 2016-10-12 MED ORDER — HEPARIN SODIUM (PORCINE) 5000 UNIT/ML IJ SOLN
5000.0000 [IU] | Freq: Three times a day (TID) | INTRAMUSCULAR | Status: DC
  Administered 2016-10-13 – 2016-10-14 (×6): 5000 [IU] via SUBCUTANEOUS
  Filled 2016-10-12 (×8): qty 1

## 2016-10-12 MED ORDER — ONDANSETRON HCL 4 MG PO TABS: 4.0000 mg | ORAL_TABLET | Freq: Four times a day (QID) | ORAL | Status: DC | PRN

## 2016-10-12 NOTE — ED Triage Notes (Signed)
Pt is c/o shortness of breath x 1 week  Pt's resp are labored in triage  Pt states he cannot lay down because it is too hard to breath and states he gets very short of breath on any type of exertion  Pt also has 4 plus pitting edema in his lower extremities  Pt states he is supposed to take medications but has not been on them for the a year and a half

## 2016-10-12 NOTE — Progress Notes (Addendum)
EDCM spoke to patient at bedside. Patient confirms he does not have a pcp or insurance living in [place].  [facility] provided patient with contact information to [facility], informed patient of services there.  EDCM also provided patient with list of pcps who accept self pay patients, list of discount pharmacies and websites needymeds.org and GoodRX.com for medication assistance, phone number to inquire about the orange card, phone number to inquire about Medicaid, phone number to inquire about the [place], financial resources in the community such as local churches, salvation army, urban ministries, and dental assistance for uninsured patients.  Patient thankful for resources.  No further EDCM needs at this time.  EDCM also provided patient the above information in [place].

## 2016-10-12 NOTE — ED Notes (Signed)
Patient transported to X-ray 

## 2016-10-12 NOTE — ED Notes (Signed)
ED Provider at bedside. 

## 2016-10-12 NOTE — ED Notes (Signed)
Hospitalist at bedside 

## 2016-10-12 NOTE — ED Provider Notes (Signed)
[facility] DEPT Provider Note   CSN: [id number] Arrival date & time: 10/12/16  1916     History   Chief Complaint Chief Complaint  Patient presents with  . Shortness of Breath  . Leg Swelling    HPI Fernando Jones is a 55 y.o. male.  HPI  54 year old male presents with shortness of breath. Has been ongoing for about one week. Most prominent when he lays flat or leans back more than straight up. When he walks he also gets short of breath. There is no associated chest pain. He has been having on and off leg swelling for about one month. However last week or so it has remained constant. It is bilateral. He used to take medicine for high blood pressure, diabetes, and cholesterol but states that he has been doing diet and herbal medicines to help control these. Denies any known history of cardiac disease or CHF. Occasionally has headaches but none right now. Has been having constipation and feels full frequently. No abdominal distention.  Past Medical History:  Diagnosis Date  . Diabetes mellitus without complication ([place])   . Hypertension     Patient Active Problem List   Diagnosis Date Noted  . Acute respiratory failure with hypoxia ([place]) 10/12/2016  . Anasarca 10/12/2016  . Acute renal failure (ARF) ([place]) 10/12/2016  . Hypertensive urgency 10/12/2016  . Acute respiratory failure ([place]) 10/12/2016  . ERECTILE DYSFUNCTION, NON-ORGANIC 06/23/2009  . DM (diabetes mellitus), type 2 with renal complications ([place]) 46/80/3212  . HYPERLIPIDEMIA 05/20/2009  . TOBACCO ABUSE 05/20/2009  . HYPERTENSION 05/20/2009  . CONSTIPATION 05/20/2009    Past Surgical History:  Procedure Laterality Date  . APPENDECTOMY         Home Medications    Prior to Admission medications   Medication Sig Start Date End Date Taking? Authorizing Provider  docusate sodium (COLACE) 100 MG capsule Take 100 mg by mouth 2 (two) times daily.      Historical Provider, MD  glipiZIDE (GLUCOTROL) 10  MG tablet Take 10 mg by mouth 2 (two) times daily before a meal.      Historical Provider, MD  lisinopril-hydrochlorothiazide (PRINZIDE,ZESTORETIC) 10-12.5 MG per tablet Take 1 tablet by mouth daily.      Historical Provider, MD  metFORMIN (GLUCOPHAGE) 1000 MG tablet Take 1,000 mg by mouth 2 (two) times daily with a meal.      Historical Provider, MD  pravastatin (PRAVACHOL) 40 MG tablet Take 40 mg by mouth daily.      Historical Provider, MD  sildenafil (VIAGRA) 50 MG tablet 50 mg daily as needed. Take 1 tablet by mouth 30 to 60 minutes prior to intercourse     Historical Provider, MD    Family History Family History  Problem Relation Age of Onset  . Congestive Heart Failure Mother   . Diabetes Neg Hx     Social History Social History  Substance Use Topics  . Smoking status: Current Some Day Smoker  . Smokeless tobacco: Never Used  . Alcohol use No     Allergies   Patient has no known allergies.   Review of Systems Review of Systems  Constitutional: Negative for fever.  Respiratory: Positive for shortness of breath. Negative for cough.   Cardiovascular: Positive for leg swelling. Negative for chest pain.  Gastrointestinal: Positive for constipation. Negative for abdominal distention.  All other systems reviewed and are negative.    Physical Exam Updated Vital Signs BP 163/91   Pulse 86   Resp 22  Ht 5\' 5"  (1.651 m)   SpO2 99%   Physical Exam  Constitutional: He is oriented to person, place, and time. He appears well-developed and well-nourished. No distress.  HENT:  Head: Normocephalic and atraumatic.  Right Ear: External ear normal.  Left Ear: External ear normal.  Nose: Nose normal.  Eyes: Right eye exhibits no discharge. Left eye exhibits no discharge.  Neck: Neck supple. JVD present.  Cardiovascular: Regular rhythm and normal heart sounds.  Tachycardia present.   Pulmonary/Chest: Effort normal. No accessory muscle usage. Tachypnea noted. No respiratory  distress. He has decreased breath sounds in the right lower field and the left lower field.  Abdominal: Soft. He exhibits no distension. There is no tenderness.  Musculoskeletal: He exhibits edema (4+ pitting edema knees to feet bilaterally).  Neurological: He is alert and oriented to person, place, and time.  Skin: Skin is warm and dry. He is not diaphoretic.  Nursing note and vitals reviewed.    ED Treatments / Results  Labs (all labs ordered are listed, but only abnormal results are displayed) Labs Reviewed  COMPREHENSIVE METABOLIC PANEL - Abnormal; Notable for the following:       Result Value   CO2 19 (*)    Glucose, Bld 199 (*)    BUN 55 (*)    Creatinine, Ser 4.17 (*)    Calcium 8.2 (*)    Total Protein 6.2 (*)    Albumin 3.1 (*)    GFR calc non Af Amer 15 (*)    GFR calc Af Amer 17 (*)    All other components within normal limits  BRAIN NATRIURETIC PEPTIDE - Abnormal; Notable for the following:    B Natriuretic Peptide 1,356.1 (*)    All other components within normal limits  TROPONIN I - Abnormal; Notable for the following:    Troponin I 0.04 (*)    All other components within normal limits  CBC WITH DIFFERENTIAL/PLATELET - Abnormal; Notable for the following:    RBC 3.45 (*)    Hemoglobin 10.2 (*)    HCT 30.1 (*)    All other components within normal limits  URINALYSIS, ROUTINE W REFLEX MICROSCOPIC (NOT AT [facility]) - Abnormal; Notable for the following:    Glucose, UA 100 (*)    Hgb urine dipstick SMALL (*)    Protein, ur >300 (*)    All other components within normal limits  URINE MICROSCOPIC-ADD ON - Abnormal; Notable for the following:    Squamous Epithelial / LPF 0-5 (*)    Bacteria, UA RARE (*)    Casts GRANULAR CAST (*)    All other components within normal limits  TROPONIN I - Abnormal; Notable for the following:    Troponin I 0.05 (*)    All other components within normal limits  CBC - Abnormal; Notable for the following:    RBC 3.43 (*)    Hemoglobin  10.0 (*)    HCT 30.0 (*)    All other components within normal limits  CREATININE, SERUM - Abnormal; Notable for the following:    Creatinine, Ser 4.23 (*)    GFR calc non Af Amer 14 (*)    GFR calc Af Amer 17 (*)    All other components within normal limits  CBG MONITORING, ED - Abnormal; Notable for the following:    Glucose-Capillary 124 (*)    All other components within normal limits  TROPONIN I  TROPONIN I  HEMOGLOBIN A1C  TSH  CBC WITH DIFFERENTIAL/PLATELET  COMPREHENSIVE  METABOLIC PANEL  PROTEIN / CREATININE RATIO, URINE    EKG  EKG Interpretation  Date/Time:  Tuesday October 12 2016 19:25:09 EST Ventricular Rate:  110 PR Interval:    QRS Duration: 99 QT Interval:  351 QTC Calculation: 475 R Axis:   54 Text Interpretation:  Sinus tachycardia Consider left atrial enlargement Borderline T abnormalities, lateral leads similar to 2012 Confirmed by Primo Innis MD, [place] [phone]) on 10/12/2016 8:17:39 PM       Radiology Dg Chest 2 View  Result Date: 10/12/2016 CLINICAL DATA:  Shortness of Breath EXAM: CHEST  2 VIEW COMPARISON:  05/09/2009 FINDINGS: Cardiac shadow is within normal limits. Mild vascular congestion is noted. Small bilateral pleural effusions are seen. Bibasilar atelectatic changes are noted worse on left than the right. IMPRESSION: Mild vascular congestion and bibasilar atelectasis with small effusions. Electronically Signed   By: Inez Catalina M.D.   On: 10/12/2016 20:17    Procedures Procedures (including critical care time)  Medications Ordered in ED Medications  nitroGLYCERIN 50 mg in dextrose 5 % 250 mL (0.2 mg/mL) infusion (30 mcg/min Intravenous Rate/Dose Change 10/13/16 0037)  hydrALAZINE (APRESOLINE) tablet 25 mg (25 mg Oral Given 10/12/16 2352)  acetaminophen (TYLENOL) tablet 650 mg (not administered)    Or  acetaminophen (TYLENOL) suppository 650 mg (not administered)  ondansetron (ZOFRAN) tablet 4 mg (not administered)    Or  ondansetron  (ZOFRAN) injection 4 mg (not administered)  insulin aspart (novoLOG) injection 0-9 Units (not administered)  furosemide (LASIX) injection 80 mg (not administered)  aspirin EC tablet 81 mg (not administered)  heparin injection 5,000 Units (5,000 Units Subcutaneous Given 10/13/16 0035)  sodium chloride flush (NS) 0.9 % injection 3 mL (0 mLs Intravenous Hold 10/12/16 2332)  aspirin chewable tablet 324 mg (324 mg Oral Given 10/12/16 2020)  furosemide (LASIX) injection 80 mg (80 mg Intravenous Given 10/12/16 2152)  zolpidem (AMBIEN) tablet 5 mg (5 mg Oral Given 10/13/16 0033)     Initial Impression / Assessment and Plan / ED Course  I have reviewed the triage vital signs and the nursing notes.  Pertinent labs & imaging results that were available during my care of the patient were reviewed by me and considered in my medical decision making (see chart for details).  Clinical Course as of Oct 14 43  Tue Oct 12, 2016  2006 Appears to have fluid overload. Not in distress but is tachypneic and has to sit straight up. Labs, CXR  [SG]  2144 Workup shows elevated BNP, pleural effusions and AKI. I think his AKI is from uncontrolled HTN, likely longstanding. I don't think he's dehydrated. Will give lasix since I think he's total volume overloaded  [SG]  2159 Dr. Hal Hope asks for stat renal u/s, in and out cath with UA, he will come to admit  [SG]    Clinical Course User Index [SG] Sherwood Gambler, MD    Patient is not in distress but is having increased work of breathing to a mild degree while sitting straight up. Appears to be having fluid overload and possibly CHF, probably from chronically uncontrolled and severe hypertension. This is likely also causing his kidney injury. Given Lasix given I think he is overall fluid overloaded. He is still urinating and had over 450 mL of urine out when he urinated on his own. No significant urinary retention. Admit to the hospitalist.  Final Clinical  Impressions(s) / ED Diagnoses   Final diagnoses:  Acute respiratory failure, unspecified whether with hypoxia or hypercapnia ([place])  Acute kidney injury ([place])  Hypervolemia, unspecified hypervolemia type    New Prescriptions New Prescriptions   No medications on file     Sherwood Gambler, MD 10/13/16 0045

## 2016-10-12 NOTE — H&P (Signed)
History and Physical    Fernando Jones RAQ:[id number] DOB: [birth year] DOA: 10/12/2016  PCP: No primary care provider on file.  Patient coming from: Home.  Chief Complaint: Shortness of breath.  HPI: Fernando Jones is a 55 y.o. male with history of diabetes mellitus type 2, hypertension, hyperlipidemia who has not been taking his medications for many years and has not followed up with any PCP for at least 5 years presents to the ER because of worsening shortness of breath. Patient states over the last 2 weeks patient has noticed increasing shortness of breath with orthopnea and paroxysmal dyspnea. Today patient became short of breath even with minimal exertion. Denies chest pain and productive cough fever chills. Patient also has been noticing increasing lower extremity edema. On exam patient has significant edema of both lower extremities. Chest x-ray shows congestion and blood pressure was markedly elevated. Patient's creatinine has worsened previously been normal 5 years ago. Today's labs show creatinine of 4.1. Patient is making urine. Patient is being admitted for acute respiratory failure with anasarca and renal failure.  Urine analysis shows granular casts and proteinuria. Troponin is mildly elevated. EKG shows sinus tachycardia.   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 2-D echo done at that time showed EF of 55-60%.  ED Course: Lasix 80 mg IV was given in the ER.  Review of Systems: As per HPI, rest all negative.   Past Medical History:  Diagnosis Date  . Diabetes mellitus without complication ([place])   . Hypertension     Past Surgical History:  Procedure Laterality Date  . APPENDECTOMY       reports that he has been smoking.  He has never used smokeless tobacco. He reports that he does not drink alcohol or use drugs.  No Known Allergies  Family History  Problem Relation Age of Onset  . Congestive Heart Failure Mother   .  Diabetes Neg Hx     Prior to Admission medications   Medication Sig Start Date End Date Taking? Authorizing Provider  docusate sodium (COLACE) 100 MG capsule Take 100 mg by mouth 2 (two) times daily.      Historical Provider, MD  glipiZIDE (GLUCOTROL) 10 MG tablet Take 10 mg by mouth 2 (two) times daily before a meal.      Historical Provider, MD  lisinopril-hydrochlorothiazide (PRINZIDE,ZESTORETIC) 10-12.5 MG per tablet Take 1 tablet by mouth daily.      Historical Provider, MD  metFORMIN (GLUCOPHAGE) 1000 MG tablet Take 1,000 mg by mouth 2 (two) times daily with a meal.      Historical Provider, MD  pravastatin (PRAVACHOL) 40 MG tablet Take 40 mg by mouth daily.      Historical Provider, MD  sildenafil (VIAGRA) 50 MG tablet 50 mg daily as needed. Take 1 tablet by mouth 30 to 60 minutes prior to intercourse     Historical Provider, MD    Physical Exam: Vitals:   10/12/16 2145 10/12/16 2200 10/12/16 2215 10/12/16 2230  BP: 194/99 (!) 181/102 189/99 (!) 182/103  Pulse: 94 94 95 90  Resp: (!) 28 (!) 29 25 (!) 45  SpO2: 100% 98% 100% 99%  Height:          Constitutional: Moderately built and nourished. Vitals:   10/12/16 2145 10/12/16 2200 10/12/16 2215 10/12/16 2230  BP: 194/99 (!) 181/102 189/99 (!) 182/103  Pulse: 94 94 95 90  Resp: (!) 28 (!) 29 25 (!) 45  SpO2: 100%  98% 100% 99%  Height:       Eyes: Anicteric no pallor. ENMT: No discharge from the ears eyes nose and mouth. Neck: No mass felt. JVD elevated. Respiratory: No rhonchi or crepitations. Cardiovascular: S1 and S2 heard. No murmurs appreciated. Abdomen: Soft nontender bowel sounds present. No guarding or rigidity. Musculoskeletal: Bilateral lower extremity extending up to the thighs. Skin: No rash. Skin appears warm. Neurologic: Alert awake oriented to time place and person. Moves all extremities. Psychiatric: Appears normal. Normal affect.   Labs on Admission: I have personally reviewed following labs and  imaging studies  CBC:  Recent Labs Lab 10/12/16 2011  WBC 7.7  NEUTROABS 5.5  HGB 10.2*  HCT 30.1*  MCV 87.2  PLT 250   Basic Metabolic Panel:  Recent Labs Lab 10/12/16 2011  NA 136  K 4.6  CL 109  CO2 19*  GLUCOSE 199*  BUN 55*  CREATININE 4.17*  CALCIUM 8.2*   GFR: CrCl cannot be calculated (Unknown ideal weight.). Liver Function Tests:  Recent Labs Lab 10/12/16 2011  AST 27  ALT 23  ALKPHOS 114  BILITOT 1.2  PROT 6.2*  ALBUMIN 3.1*   No results for input(s): LIPASE, AMYLASE in the last 168 hours. No results for input(s): AMMONIA in the last 168 hours. Coagulation Profile: No results for input(s): INR, PROTIME in the last 168 hours. Cardiac Enzymes:  Recent Labs Lab 10/12/16 2011  TROPONINI 0.04*   BNP (last 3 results) No results for input(s): PROBNP in the last 8760 hours. HbA1C: No results for input(s): HGBA1C in the last 72 hours. CBG:  Recent Labs Lab 10/12/16 1937  GLUCAP 124*   Lipid Profile: No results for input(s): CHOL, HDL, LDLCALC, TRIG, CHOLHDL, LDLDIRECT in the last 72 hours. Thyroid Function Tests: No results for input(s): TSH, T4TOTAL, FREET4, T3FREE, THYROIDAB in the last 72 hours. Anemia Panel: No results for input(s): VITAMINB12, FOLATE, FERRITIN, TIBC, IRON, RETICCTPCT in the last 72 hours. Urine analysis:    Component Value Date/Time   COLORURINE YELLOW 10/12/2016 2200   APPEARANCEUR CLEAR 10/12/2016 2200   LABSPEC 1.018 10/12/2016 2200   PHURINE 6.0 10/12/2016 2200   GLUCOSEU 100 (A) 10/12/2016 2200   HGBUR SMALL (A) 10/12/2016 2200   BILIRUBINUR NEGATIVE 10/12/2016 2200   KETONESUR NEGATIVE 10/12/2016 2200   PROTEINUR >300 (A) 10/12/2016 2200   NITRITE NEGATIVE 10/12/2016 2200   LEUKOCYTESUR NEGATIVE 10/12/2016 2200   Sepsis Labs: @LABRCNTIP (procalcitonin:4,lacticidven:4) )No results found for this or any previous visit (from the past 240 hour(s)).   Radiological Exams on Admission: Dg Chest 2  View  Result Date: 10/12/2016 CLINICAL DATA:  Shortness of Breath EXAM: CHEST  2 VIEW COMPARISON:  05/09/2009 FINDINGS: Cardiac shadow is within normal limits. Mild vascular congestion is noted. Small bilateral pleural effusions are seen. Bibasilar atelectatic changes are noted worse on left than the right. IMPRESSION: Mild vascular congestion and bibasilar atelectasis with small effusions. Electronically Signed   By: Inez Catalina M.D.   On: 10/12/2016 20:17    EKG: Independently reviewed. Sinus tachycardia with nonspecific ECG changes.  Assessment/Plan Principal Problem:   Acute respiratory failure with hypoxia ([facility]) Active Problems:   DM (diabetes mellitus), type 2 with renal complications ([facility])   TOBACCO ABUSE   Anasarca   Acute renal failure (ARF) ([facility])   Hypertensive urgency   Acute respiratory failure ([place])    1. Acute respiratory failure with hypoxia with anasarca - most likely secondary to acute pulmonary edema with worsening renal function and uncontrolled  hypertension. Check 2-D echo cycle cardiac markers. I have placed patient on nitroglycerin infusion and Lasix 80 mg IV every 8. Closely follow intake output metabolic panel. 2. Acute renal failure with possible chronic kidney disease - given the proteinuria patient probably has chronic component. Check urine protein creatinine ratio and FENa. Patient is on Lasix 80 mg IV every 8. Renal sonogram is pending. Patient is making urine. Consult nephrologist in a.m. 3. Hypertensive urgency - patient has been placed on IV nitroglycerin infusion and I have also placed patient on hydralazine 25 mg by mouth 3 times a day. Closely follow blood pressure trends and may need to add more antihypertensives if required. 4. Anemia normocytic normochromic - check anemia panel. Will eventually need colonoscopy. 5. Diabetes mellitus type 2 - for now and place patient on sliding scale coverage. Follow hemoglobin A1c. 6. Tobacco abuse - advised to quit  smoking.   DVT prophylaxis: Heparin. Code Status: Full code.  Family Communication: Discussed with patient.  Disposition Plan: Home.  Consults called: None.  Admission status: Inpatient. Likely stay 3-4 days.    Rise Patience MD [facility] Pager [phone].  If 7PM-7AM, please contact night-coverage [email or web address] Password [email or web address]  10/12/2016, 11:10 PM

## 2016-10-13 ENCOUNTER — Inpatient Hospital Stay (HOSPITAL_COMMUNITY): Payer: Self-pay

## 2016-10-13 ENCOUNTER — Encounter (HOSPITAL_COMMUNITY): Payer: Self-pay

## 2016-10-13 DIAGNOSIS — I5033 Acute on chronic diastolic (congestive) heart failure: Secondary | ICD-10-CM | POA: Diagnosis present

## 2016-10-13 DIAGNOSIS — I509 Heart failure, unspecified: Secondary | ICD-10-CM

## 2016-10-13 DIAGNOSIS — J9601 Acute respiratory failure with hypoxia: Secondary | ICD-10-CM

## 2016-10-13 LAB — COMPREHENSIVE METABOLIC PANEL
ALT: 20 U/L (ref 17–63)
AST: 16 U/L (ref 15–41)
Albumin: 2.8 g/dL — ABNORMAL LOW (ref 3.5–5.0)
Alkaline Phosphatase: 103 U/L (ref 38–126)
Anion gap: 8 (ref 5–15)
BUN: 57 mg/dL — ABNORMAL HIGH (ref 6–20)
CHLORIDE: 111 mmol/L (ref 101–111)
CO2: 19 mmol/L — AB (ref 22–32)
CREATININE: 4.25 mg/dL — AB (ref 0.61–1.24)
Calcium: 8.2 mg/dL — ABNORMAL LOW (ref 8.9–10.3)
GFR calc non Af Amer: 14 mL/min — ABNORMAL LOW (ref >60)
GFR, EST AFRICAN AMERICAN: 17 mL/min — AB (ref >60)
Glucose, Bld: 81 mg/dL (ref 65–99)
Potassium: 3.9 mmol/L (ref 3.5–5.1)
SODIUM: 138 mmol/L (ref 135–145)
Total Bilirubin: 0.8 mg/dL (ref 0.3–1.2)
Total Protein: 5.5 g/dL — ABNORMAL LOW (ref 6.5–8.1)

## 2016-10-13 LAB — PROTEIN / CREATININE RATIO, URINE
Creatinine, Urine: 82.27 mg/dL
Protein Creatinine Ratio: 7.33 mg/mg{Cre} — ABNORMAL HIGH (ref 0.00–0.15)
TOTAL PROTEIN, URINE: 603 mg/dL

## 2016-10-13 LAB — CBC WITH DIFFERENTIAL/PLATELET
Basophils Absolute: 0.1 10*3/uL (ref 0.0–0.1)
Basophils Relative: 2 %
EOS ABS: 0.6 10*3/uL (ref 0.0–0.7)
EOS PCT: 8 %
HCT: 27.2 % — ABNORMAL LOW (ref 39.0–52.0)
Hemoglobin: 9.2 g/dL — ABNORMAL LOW (ref 13.0–17.0)
Lymphocytes Relative: 18 %
Lymphs Abs: 1.3 10*3/uL (ref 0.7–4.0)
MCH: 29.4 pg (ref 26.0–34.0)
MCHC: 33.8 g/dL (ref 30.0–36.0)
MCV: 86.9 fL (ref 78.0–100.0)
Monocytes Absolute: 0.6 10*3/uL (ref 0.1–1.0)
Monocytes Relative: 8 %
Neutro Abs: 4.4 10*3/uL (ref 1.7–7.7)
Neutrophils Relative %: 64 %
PLATELETS: 301 10*3/uL (ref 150–400)
RBC: 3.13 MIL/uL — AB (ref 4.22–5.81)
RDW: 15.2 % (ref 11.5–15.5)
WBC: 6.9 10*3/uL (ref 4.0–10.5)

## 2016-10-13 LAB — ECHOCARDIOGRAM COMPLETE: HEIGHTINCHES: 65 in

## 2016-10-13 LAB — IRON AND TIBC
IRON: 51 ug/dL (ref 45–182)
Saturation Ratios: 21 % (ref 17.9–39.5)
TIBC: 244 ug/dL — ABNORMAL LOW (ref 250–450)
UIBC: 193 ug/dL

## 2016-10-13 LAB — TROPONIN I
Troponin I: 0.04 ng/mL (ref <0.03)
Troponin I: 0.05 ng/mL (ref <0.03)
Troponin I: 0.05 ng/mL (ref <0.03)

## 2016-10-13 LAB — CBC
HCT: 30 % — ABNORMAL LOW (ref 39.0–52.0)
Hemoglobin: 10 g/dL — ABNORMAL LOW (ref 13.0–17.0)
MCH: 29.2 pg (ref 26.0–34.0)
MCHC: 33.3 g/dL (ref 30.0–36.0)
MCV: 87.5 fL (ref 78.0–100.0)
PLATELETS: 337 10*3/uL (ref 150–400)
RBC: 3.43 MIL/uL — AB (ref 4.22–5.81)
RDW: 15 % (ref 11.5–15.5)
WBC: 7 10*3/uL (ref 4.0–10.5)

## 2016-10-13 LAB — CREATININE, SERUM
CREATININE: 4.23 mg/dL — AB (ref 0.61–1.24)
GFR, EST AFRICAN AMERICAN: 17 mL/min — AB (ref >60)
GFR, EST NON AFRICAN AMERICAN: 14 mL/min — AB (ref >60)

## 2016-10-13 LAB — GLUCOSE, CAPILLARY
GLUCOSE-CAPILLARY: 91 mg/dL (ref 65–99)
Glucose-Capillary: 72 mg/dL (ref 65–99)
Glucose-Capillary: 103 mg/dL — ABNORMAL HIGH (ref 65–99)

## 2016-10-13 LAB — FOLATE: FOLATE: 7.2 ng/mL (ref >5.9)

## 2016-10-13 LAB — VITAMIN B12: Vitamin B-12: 1319 pg/mL — ABNORMAL HIGH (ref 180–914)

## 2016-10-13 LAB — RAPID URINE DRUG SCREEN, HOSP PERFORMED
AMPHETAMINES: NOT DETECTED
Barbiturates: NOT DETECTED
Benzodiazepines: NOT DETECTED
Cocaine: NOT DETECTED
OPIATES: NOT DETECTED
TETRAHYDROCANNABINOL: NOT DETECTED

## 2016-10-13 LAB — CK: Total CK: 142 U/L (ref 49–397)

## 2016-10-13 LAB — TSH: TSH: 4.205 u[IU]/mL (ref 0.350–4.500)

## 2016-10-13 LAB — CREATININE, URINE, RANDOM: CREATININE, URINE: 21.18 mg/dL

## 2016-10-13 LAB — RETICULOCYTES
RBC.: 3.1 MIL/uL — ABNORMAL LOW (ref 4.22–5.81)
RETIC COUNT ABSOLUTE: 49.6 10*3/uL (ref 19.0–186.0)
Retic Ct Pct: 1.6 % (ref 0.4–3.1)

## 2016-10-13 LAB — CBG MONITORING, ED: Glucose-Capillary: 78 mg/dL (ref 65–99)

## 2016-10-13 LAB — SODIUM, URINE, RANDOM: Sodium, Ur: 111 mmol/L

## 2016-10-13 LAB — FERRITIN: Ferritin: 38 ng/mL (ref 24–336)

## 2016-10-13 MED ORDER — HYDRALAZINE HCL 20 MG/ML IJ SOLN: 10.0000 mg | INTRAMUSCULAR | Status: DC | PRN

## 2016-10-13 MED ORDER — CARVEDILOL 3.125 MG PO TABS
3.1250 mg | ORAL_TABLET | Freq: Two times a day (BID) | ORAL | Status: DC
  Administered 2016-10-13 – 2016-10-15 (×4): 3.125 mg via ORAL
  Filled 2016-10-13 (×4): qty 1

## 2016-10-13 NOTE — ED Notes (Signed)
Contacted Dr. Wynetta Emery and he stated he would see patient in ICU regarding right hand.    He requested not to increase nitroglycerin

## 2016-10-13 NOTE — Plan of Care (Signed)
Problem: Safety: Goal: Ability to remain free from injury will improve Outcome: Completed/Met Date Met: 10/13/16 Call bell within reach, bed alarm set. Pt is aware to call when needing t get out bed. Pt agreable to safety prevention plan

## 2016-10-13 NOTE — Care Management Note (Signed)
Case Management Note  Patient Details  Name: Fernando Jones MRN: [id number] Date of Birth: February 24, 1961  Subjective/Objective: 55 y/o m admitted w/Acute Resp failure. From home.                   Action/Plan:d/c home.   Expected Discharge Date:   (unknown)               Expected Discharge Plan:  Home/Self Care  In-House Referral:     Discharge planning Services  CM Consult  Post Acute Care Choice:    Choice offered to:     DME Arranged:    DME Agency:     HH Arranged:    HH Agency:     Status of Service:  In process, will continue to follow  If discussed at Long Length of Stay Meetings, dates discussed:    Additional Comments:  Dessa Phi, RN 10/13/2016, 4:07 PM

## 2016-10-13 NOTE — Consult Note (Signed)
Reason for Consult:  Elevated Scr presumably due to progressive CKD vs AKI Referring Physician: Nichola Sizer, MD  Fernando Jones is an 55 y.o. male.  HPI: Fernando Jones is a 55yo Hispanic male with PMH significant for poorly controlled DM, HTN, and tobacco abuse who presented to [facility] ED with worsening SOB.  He admits that he stopped taking all of his medications for the last 5 years and has not seen a physician since 2012.  He stopped his BP medications because of erectile dysfunction and abdominal pain.  He reports that after he had his cardiac catheterization in 2012 he developed the above issues and felt that the hospital caused his issues so stopped taking his medications and follow up.  He has not been checking his BP's or blood sugars.  He denies any N/V/D/CP, hematuria, pyuria, dysuria, or retention.  He does not have a family history of CKD.  Trend in Creatinine: Creatinine, Ser  Date/Time Value Ref Range Status  10/13/2016 06:03 AM 4.25 (H) 0.61 - 1.24 mg/dL Final  10/12/2016 11:30 PM 4.23 (H) 0.61 - 1.24 mg/dL Final  10/12/2016 08:11 PM 4.17 (H) 0.61 - 1.24 mg/dL Final  07/03/2011 06:15 PM 0.89 0.50 - 1.35 mg/dL Final  06/23/2009 09:55 PM 1.15 0.40 - 1.50 mg/dL Final  05/13/2009 06:15 AM 0.86 0.4 - 1.5 mg/dL Final  05/12/2009 06:00 AM 0.89 0.4 - 1.5 mg/dL Final  05/10/2009 05:45 AM 0.71 0.4 - 1.5 mg/dL Final  05/09/2009 05:30 AM 0.89 0.4 - 1.5 mg/dL Final    PMH:   Past Medical History:  Diagnosis Date  . Diabetes mellitus without complication ([place])   . Erectile dysfunction 2012  . Hyperlipidemia 2012  . Hypertension   . Tobacco abuse 2012   1/2 pack per day    PSH:   Past Surgical History:  Procedure Laterality Date  . APPENDECTOMY      Allergies: No Known Allergies  Medications:  Has not taken any medications in over a year and a half. Prior to Admission medications   Medication Sig Start Date End Date Taking? Authorizing Provider  docusate sodium (COLACE) 100 MG  capsule Take 100 mg by mouth 2 (two) times daily.      Historical Provider, MD  glipiZIDE (GLUCOTROL) 10 MG tablet Take 10 mg by mouth 2 (two) times daily before a meal.      Historical Provider, MD  lisinopril-hydrochlorothiazide (PRINZIDE,ZESTORETIC) 10-12.5 MG per tablet Take 1 tablet by mouth daily.      Historical Provider, MD  metFORMIN (GLUCOPHAGE) 1000 MG tablet Take 1,000 mg by mouth 2 (two) times daily with a meal.      Historical Provider, MD  pravastatin (PRAVACHOL) 40 MG tablet Take 40 mg by mouth daily.      Historical Provider, MD  sildenafil (VIAGRA) 50 MG tablet 50 mg daily as needed. Take 1 tablet by mouth 30 to 60 minutes prior to intercourse     Historical Provider, MD    Inpatient medications: . aspirin EC  81 mg Oral Daily  . furosemide  80 mg Intravenous Q8H  . heparin  5,000 Units Subcutaneous Q8H  . hydrALAZINE  25 mg Oral Q8H  . insulin aspart  0-9 Units Subcutaneous TID WC  . sodium chloride flush  3 mL Intravenous Q12H    Discontinued Meds:   Medications Discontinued During This Encounter  Medication Reason  . nitroGLYCERIN (NITROSTAT) SL tablet 0.4 mg     Social History:  reports that he has been smoking.  He has never used smokeless tobacco. He reports that he does not drink alcohol or use drugs.  Family History:   Family History  Problem Relation Age of Onset  . Congestive Heart Failure Mother   . Diabetes Neg Hx     A comprehensive review of systems was negative except for: Respiratory: positive for dyspnea on exertion Cardiovascular: positive for lower extremity edema Genitourinary: positive for sexual problems and erectile dysfunction Weight change:   Intake/Output Summary (Last 24 hours) at 10/13/16 1112 Last data filed at 10/13/16 1011  Gross per 24 hour  Intake           310.93 ml  Output             1650 ml  Net         -1339.07 ml   BP 161/84   Pulse 88   Resp 21   Ht 5\' 5"  (1.651 m)   SpO2 (!) 87%  Vitals:   10/13/16 0800  10/13/16 0815 10/13/16 0830 10/13/16 0845  BP: 145/60 168/78 179/84 161/84  Pulse: 82 89 94 88  Resp: (!) 28 (!) 28 22 21   SpO2: 95% (!) 88% 94% (!) 87%  Height:         General appearance: alert, cooperative and no distress Head: Normocephalic, without obvious abnormality, atraumatic Neck: no adenopathy, no carotid bruit, no JVD, supple, symmetrical, trachea midline and thyroid not enlarged, symmetric, no tenderness/mass/nodules Resp: rales bibasilar Cardio: regular rate and rhythm and no rub GI: soft, non-tender; bowel sounds normal; no masses,  no organomegaly Extremities: edema 1 + pitting to mid shin bilaterally  Labs: Basic Metabolic Panel:  Recent Labs Lab 10/12/16 2011 10/12/16 2330 10/13/16 0603  NA 136  --  138  K 4.6  --  3.9  CL 109  --  111  CO2 19*  --  19*  GLUCOSE 199*  --  81  BUN 55*  --  57*  CREATININE 4.17* 4.23* 4.25*  ALBUMIN 3.1*  --  2.8*  CALCIUM 8.2*  --  8.2*   Liver Function Tests:  Recent Labs Lab 10/12/16 2011 10/13/16 0603  AST 27 16  ALT 23 20  ALKPHOS 114 103  BILITOT 1.2 0.8  PROT 6.2* 5.5*  ALBUMIN 3.1* 2.8*   No results for input(s): LIPASE, AMYLASE in the last 168 hours. No results for input(s): AMMONIA in the last 168 hours. CBC:  Recent Labs Lab 10/12/16 2011 10/12/16 2330 10/13/16 0603  WBC 7.7 7.0 6.9  NEUTROABS 5.5  --  4.4  HGB 10.2* 10.0* 9.2*  HCT 30.1* 30.0* 27.2*  MCV 87.2 87.5 86.9  PLT 336 337 301   PT/INR: @LABRCNTIP (inr:5) Cardiac Enzymes: ) Recent Labs Lab 10/12/16 2011 10/12/16 2330 10/13/16 0603  TROPONINI 0.04* 0.05* 0.05*   CBG:  Recent Labs Lab 10/12/16 1937 10/13/16 0805  GLUCAP 124* 78    Iron Studies: No results for input(s): IRON, TIBC, TRANSFERRIN, FERRITIN in the last 168 hours.  Xrays/Other Studies: Dg Chest 2 View  Result Date: 10/12/2016 CLINICAL DATA:  Shortness of Breath EXAM: CHEST  2 VIEW COMPARISON:  05/09/2009 FINDINGS: Cardiac shadow is within normal limits.  Mild vascular congestion is noted. Small bilateral pleural effusions are seen. Bibasilar atelectatic changes are noted worse on left than the right. IMPRESSION: Mild vascular congestion and bibasilar atelectasis with small effusions. Electronically Signed   By: Inez Catalina M.D.   On: 10/12/2016 20:17   US Renal  Result Date: 10/13/2016 CLINICAL DATA:  Acute renal failure. History of hypertension and diabetes. EXAM: RENAL / URINARY TRACT ULTRASOUND COMPLETE COMPARISON:  Abdomen series 07/03/2011 FINDINGS: Right Kidney: Length: 10 cm. Diffusely increased parenchymal echotexture consistent with chronic medical renal disease. No solid mass or hydronephrosis. Left Kidney: Length: 9.5 cm. Diffusely increased parenchymal echotexture consistent with chronic medical renal disease. No solid mass or hydronephrosis. Bladder: No wall thickening or filling defects demonstrated. Bilateral urine flow jets demonstrated on color flow Doppler imaging. Incidental finding of bilateral pleural effusions. IMPRESSION: Increase renal parenchymal echotexture bilaterally consistent with chronic medical renal disease. No hydronephrosis. Bilateral pleural effusions. Electronically Signed   By: Lucienne Capers M.D.   On: 10/13/2016 06:48     Assessment/Plan: 1.  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 Progressive CKD stage 4- presumably this is related to progressive CKD and not an acute event given his renal US and presence of anemia, as well as his history of no medication or medical care for 5 years.   2. CHF- presumably due to combination of hypertensive heart disease and CKD.  Will need ECHO to evaluate EF (last done in 2012 during cardiac cath which was without obstructive disease and EF 65%).   3. Urgent Hypertension- avoid ACE/ARB for now.  Agree with lasix and hydralazine.  Consider beta-blocker. 4. DM type 2 poorly controlled.  Per primary svc 5. Proteinuria- collect 24 hour sample. Serologies sent for GN work up but likely related to diabetic  Nephropathy 6. Anemia of chronic kidney disease- will check iron stores and may benefit from ESA 7. Secondary HPTH- will check calcium, phos, and iPTH as well as vitamin D levels  8. Protein malnutrition- per primary svc 9. Tobacco abuse- encourage smoking cessation   Donetta Potts 10/13/2016, 11:12 AM

## 2016-10-13 NOTE — ED Notes (Signed)
Delay in Hydralazine and Heparin administration due to medications not being sent yet

## 2016-10-13 NOTE — ED Notes (Signed)
MD Provider at bedside. 

## 2016-10-13 NOTE — Progress Notes (Signed)
10/12/2016  7:30 PM  10/13/2016 9:51 AM  Fernando Jones was seen and examined.  The H&P by the admitting provider, orders, imaging was reviewed.  Nephrology consult requested.  Please see orders.  Will continue to follow.   Murvin Natal, MD [facility]

## 2016-10-13 NOTE — Plan of Care (Signed)
Problem: Education: Goal: Ability to demonstrate managment of disease process will improve Outcome: Completed/Met Date Met: 10/13/16 Education booklet provided and reviewed, will continue to reinforce with pt

## 2016-10-13 NOTE — Progress Notes (Signed)
Echocardiogram 2D Echocardiogram has been performed.  Fernando Jones 10/13/2016, 11:14 AM

## 2016-10-13 NOTE — ED Notes (Signed)
Hospitalist notified of rash developing to pt's right hand

## 2016-10-13 NOTE — ED Notes (Signed)
Per staff, room has not been approved.  Will contact nurse once it is ready

## 2016-10-14 DIAGNOSIS — I5033 Acute on chronic diastolic (congestive) heart failure: Secondary | ICD-10-CM

## 2016-10-14 DIAGNOSIS — N179 Acute kidney failure, unspecified: Secondary | ICD-10-CM

## 2016-10-14 DIAGNOSIS — R601 Generalized edema: Secondary | ICD-10-CM

## 2016-10-14 DIAGNOSIS — F17200 Nicotine dependence, unspecified, uncomplicated: Secondary | ICD-10-CM

## 2016-10-14 DIAGNOSIS — E1121 Type 2 diabetes mellitus with diabetic nephropathy: Secondary | ICD-10-CM

## 2016-10-14 DIAGNOSIS — I160 Hypertensive urgency: Secondary | ICD-10-CM

## 2016-10-14 DIAGNOSIS — E559 Vitamin D deficiency, unspecified: Secondary | ICD-10-CM | POA: Diagnosis present

## 2016-10-14 LAB — RENAL FUNCTION PANEL
ALBUMIN: 2.7 g/dL — AB (ref 3.5–5.0)
ANION GAP: 9 (ref 5–15)
BUN: 61 mg/dL — ABNORMAL HIGH (ref 6–20)
CALCIUM: 8.2 mg/dL — AB (ref 8.9–10.3)
CO2: 20 mmol/L — ABNORMAL LOW (ref 22–32)
CREATININE: 4.79 mg/dL — AB (ref 0.61–1.24)
Chloride: 111 mmol/L (ref 101–111)
GFR calc non Af Amer: 12 mL/min — ABNORMAL LOW (ref >60)
GFR, EST AFRICAN AMERICAN: 14 mL/min — AB (ref >60)
GLUCOSE: 80 mg/dL (ref 65–99)
PHOSPHORUS: 4.2 mg/dL (ref 2.5–4.6)
Potassium: 3.8 mmol/L (ref 3.5–5.1)
SODIUM: 140 mmol/L (ref 135–145)

## 2016-10-14 LAB — PROTEIN, URINE, 24 HOUR
Collection Interval-UPROT: 24 hours
Protein, 24H Urine: 5432 mg/d — ABNORMAL HIGH (ref 50–100)
Protein, Urine: 112 mg/dL
Urine Total Volume-UPROT: 4850 mL

## 2016-10-14 LAB — ANTINUCLEAR ANTIBODIES, IFA: ANTINUCLEAR ANTIBODIES, IFA: NEGATIVE

## 2016-10-14 LAB — GLUCOSE, CAPILLARY
GLUCOSE-CAPILLARY: 159 mg/dL — AB (ref 65–99)
GLUCOSE-CAPILLARY: 98 mg/dL (ref 65–99)
Glucose-Capillary: 92 mg/dL (ref 65–99)
Glucose-Capillary: 106 mg/dL — ABNORMAL HIGH (ref 65–99)

## 2016-10-14 LAB — PROTEIN ELECTROPHORESIS, SERUM
A/G Ratio: 1.2 (ref 0.7–1.7)
ALBUMIN ELP: 2.7 g/dL — AB (ref 2.9–4.4)
ALPHA-1-GLOBULIN: 0.2 g/dL (ref 0.0–0.4)
Alpha-2-Globulin: 0.7 g/dL (ref 0.4–1.0)
Beta Globulin: 0.7 g/dL (ref 0.7–1.3)
GAMMA GLOBULIN: 0.6 g/dL (ref 0.4–1.8)
Globulin, Total: 2.2 g/dL (ref 2.2–3.9)
TOTAL PROTEIN ELP: 4.9 g/dL — AB (ref 6.0–8.5)

## 2016-10-14 LAB — VITAMIN D 25 HYDROXY (VIT D DEFICIENCY, FRACTURES)

## 2016-10-14 LAB — CBC
HCT: 27.5 % — ABNORMAL LOW (ref 39.0–52.0)
HEMOGLOBIN: 9.3 g/dL — AB (ref 13.0–17.0)
MCH: 29.6 pg (ref 26.0–34.0)
MCHC: 33.8 g/dL (ref 30.0–36.0)
MCV: 87.6 fL (ref 78.0–100.0)
Platelets: 299 10*3/uL (ref 150–400)
RBC: 3.14 MIL/uL — AB (ref 4.22–5.81)
RDW: 15.1 % (ref 11.5–15.5)
WBC: 6.6 10*3/uL (ref 4.0–10.5)

## 2016-10-14 LAB — PARATHYROID HORMONE, INTACT (NO CA): PTH: 121 pg/mL — AB (ref 15–65)

## 2016-10-14 LAB — HEMOGLOBIN A1C
HEMOGLOBIN A1C: 5.1 % (ref 4.8–5.6)
Hgb A1c MFr Bld: 5.3 % (ref 4.8–5.6)
MEAN PLASMA GLUCOSE: 100 mg/dL
Mean Plasma Glucose: 105 mg/dL

## 2016-10-14 LAB — C3 COMPLEMENT: C3 Complement: 94 mg/dL (ref 82–167)

## 2016-10-14 LAB — C4 COMPLEMENT: COMPLEMENT C4, BODY FLUID: 28 mg/dL (ref 14–44)

## 2016-10-14 LAB — KAPPA/LAMBDA LIGHT CHAINS
KAPPA FREE LGHT CHN: 88.2 mg/L — AB (ref 3.3–19.4)
KAPPA, LAMDA LIGHT CHAIN RATIO: 1.34 (ref 0.26–1.65)
Lambda free light chains: 65.8 mg/L — ABNORMAL HIGH (ref 5.7–26.3)

## 2016-10-14 LAB — MPO/PR-3 (ANCA) ANTIBODIES: ANCA Proteinase 3: <3.5 U/mL (ref 0.0–3.5)

## 2016-10-14 LAB — ANTI-DNA ANTIBODY, DOUBLE-STRANDED: ds DNA Ab: <1 IU/mL (ref 0–9)

## 2016-10-14 LAB — ANTISTREPTOLYSIN O TITER: ASO: <20 IU/mL (ref 0.0–200.0)

## 2016-10-14 LAB — GLOMERULAR BASEMENT MEMBRANE ANTIBODIES: GBM Ab: 3 units (ref 0–20)

## 2016-10-14 MED ORDER — MUSCLE RUB 10-15 % EX CREA
1.0000 "application " | TOPICAL_CREAM | CUTANEOUS | Status: DC | PRN
  Administered 2016-10-14: 1 via TOPICAL
  Filled 2016-10-14: qty 85

## 2016-10-14 MED ORDER — SODIUM CHLORIDE 0.9 % IV SOLN
510.0000 mg | Freq: Once | INTRAVENOUS | Status: AC
  Administered 2016-10-14: 510 mg via INTRAVENOUS
  Filled 2016-10-14: qty 17

## 2016-10-14 MED ORDER — DARBEPOETIN ALFA 40 MCG/0.4ML IJ SOSY
40.0000 ug | PREFILLED_SYRINGE | INTRAMUSCULAR | Status: DC
  Administered 2016-10-14: 40 ug via SUBCUTANEOUS
  Filled 2016-10-14 (×2): qty 0.4

## 2016-10-14 MED ORDER — HEPARIN SODIUM (PORCINE) 5000 UNIT/ML IJ SOLN
5000.0000 [IU] | Freq: Two times a day (BID) | INTRAMUSCULAR | Status: DC
  Administered 2016-10-14 – 2016-10-16 (×5): 5000 [IU] via SUBCUTANEOUS
  Filled 2016-10-14 (×5): qty 1

## 2016-10-14 MED ORDER — FUROSEMIDE 10 MG/ML IJ SOLN
80.0000 mg | Freq: Two times a day (BID) | INTRAMUSCULAR | Status: DC
  Administered 2016-10-14: 80 mg via INTRAVENOUS
  Filled 2016-10-14: qty 8

## 2016-10-14 MED ORDER — VITAMIN D (ERGOCALCIFEROL) 1.25 MG (50000 UNIT) PO CAPS
50000.0000 [IU] | ORAL_CAPSULE | ORAL | Status: DC
  Administered 2016-10-14: 50000 [IU] via ORAL
  Filled 2016-10-14 (×2): qty 1

## 2016-10-14 NOTE — Progress Notes (Signed)
Patient ID: Kylo Gavin, male   DOB: 1961/11/03, 55 y.o.   MRN: [id number] S:Feels much better today O:BP (!) 156/84 (BP Location: Left Arm)   Pulse 84   Temp 98.1 F (36.7 C) (Oral)   Resp 16   Ht 5\' 5"  (1.651 m)   Wt 72.8 kg (160 lb 6.4 oz)   SpO2 100%   BMI 26.69 kg/m   Intake/Output Summary (Last 24 hours) at 10/14/16 1047 Last data filed at 10/14/16 0856  Gross per 24 hour  Intake              360 ml  Output             4600 ml  Net            -4240 ml   Intake/Output: I/O last 3 completed shifts: In: 670.9 [P.O.:360; I.V.:310.9] Out: 5350 [Urine:5350]  Intake/Output this shift:  Total I/O In: -  Out: 900 [Urine:900] Weight change:  Gen:WD WN HM in NAD CVS:no rub Resp:cta VVO:HYWVPX Ext:1+ edema of lower leg   Recent Labs Lab 10/12/16 2011 10/12/16 2330 10/13/16 0603 10/14/16 0438  NA 136  --  138 140  K 4.6  --  3.9 3.8  CL 109  --  111 111  CO2 19*  --  19* 20*  GLUCOSE 199*  --  81 80  BUN 55*  --  57* 61*  CREATININE 4.17* 4.23* 4.25* 4.79*  ALBUMIN 3.1*  --  2.8* 2.7*  CALCIUM 8.2*  --  8.2* 8.2*  PHOS  --   --   --  4.2  AST 27  --  16  --   ALT 23  --  20  --    Liver Function Tests:  Recent Labs Lab 10/12/16 2011 10/13/16 0603 10/14/16 0438  AST 27 16  --   ALT 23 20  --   ALKPHOS 114 103  --   BILITOT 1.2 0.8  --   PROT 6.2* 5.5*  --   ALBUMIN 3.1* 2.8* 2.7*   No results for input(s): LIPASE, AMYLASE in the last 168 hours. No results for input(s): AMMONIA in the last 168 hours. CBC:  Recent Labs Lab 10/12/16 2011 10/12/16 2330 10/13/16 0603 10/14/16 0438  WBC 7.7 7.0 6.9 6.6  NEUTROABS 5.5  --  4.4  --   HGB 10.2* 10.0* 9.2* 9.3*  HCT 30.1* 30.0* 27.2* 27.5*  MCV 87.2 87.5 86.9 87.6  PLT 336 337 301 299   Cardiac Enzymes:  Recent Labs Lab 10/12/16 2011 10/12/16 2330 10/13/16 0603 10/13/16 1123 10/13/16 1124  CKTOTAL  --   --   --  142  --   TROPONINI 0.04* 0.05* 0.05*  --  0.04*   CBG:  Recent  Labs Lab 10/13/16 0805 10/13/16 1220 10/13/16 1649 10/13/16 2223 10/14/16 0759  GLUCAP 78 72 91 103* 92    Iron Studies:  Recent Labs  10/13/16 0740  IRON 51  TIBC 244*  FERRITIN 38   Studies/Results: Dg Chest 2 View  Result Date: 10/12/2016 CLINICAL DATA:  Shortness of Breath EXAM: CHEST  2 VIEW COMPARISON:  05/09/2009 FINDINGS: Cardiac shadow is within normal limits. Mild vascular congestion is noted. Small bilateral pleural effusions are seen. Bibasilar atelectatic changes are noted worse on left than the right. IMPRESSION: Mild vascular congestion and bibasilar atelectasis with small effusions. Electronically Signed   By: Inez Catalina M.D.   On: 10/12/2016 20:17   US Renal  Result Date:  10/13/2016 CLINICAL DATA:  Acute renal failure. History of hypertension and diabetes. EXAM: RENAL / URINARY TRACT ULTRASOUND COMPLETE COMPARISON:  Abdomen series 07/03/2011 FINDINGS: Right Kidney: Length: 10 cm. Diffusely increased parenchymal echotexture consistent with chronic medical renal disease. No solid mass or hydronephrosis. Left Kidney: Length: 9.5 cm. Diffusely increased parenchymal echotexture consistent with chronic medical renal disease. No solid mass or hydronephrosis. Bladder: No wall thickening or filling defects demonstrated. Bilateral urine flow jets demonstrated on color flow Doppler imaging. Incidental finding of bilateral pleural effusions. IMPRESSION: Increase renal parenchymal echotexture bilaterally consistent with chronic medical renal disease. No hydronephrosis. Bilateral pleural effusions. Electronically Signed   By: Lucienne Capers M.D.   On: 10/13/2016 06:48   . aspirin EC  81 mg Oral Daily  . carvedilol  3.125 mg Oral BID WC  . furosemide  80 mg Intravenous Q8H  . heparin  5,000 Units Subcutaneous Q8H  . hydrALAZINE  25 mg Oral Q8H  . insulin aspart  0-9 Units Subcutaneous TID WC  . sodium chloride flush  3 mL Intravenous Q12H    BMET    Component Value  Date/Time   NA 140 10/14/2016 0438   K 3.8 10/14/2016 0438   CL 111 10/14/2016 0438   CO2 20 (L) 10/14/2016 0438   GLUCOSE 80 10/14/2016 0438   BUN 61 (H) 10/14/2016 0438   CREATININE 4.79 (H) 10/14/2016 0438   CALCIUM 8.2 (L) 10/14/2016 0438   GFRNONAA 12 (L) 10/14/2016 0438   GFRAA 14 (L) 10/14/2016 0438   CBC    Component Value Date/Time   WBC 6.6 10/14/2016 0438   RBC 3.14 (L) 10/14/2016 0438   HGB 9.3 (L) 10/14/2016 0438   HCT 27.5 (L) 10/14/2016 0438   PLT 299 10/14/2016 0438   MCV 87.6 10/14/2016 0438   MCH 29.6 10/14/2016 0438   MCHC 33.8 10/14/2016 0438   RDW 15.1 10/14/2016 0438   LYMPHSABS 1.3 10/13/2016 0603   MONOABS 0.6 10/13/2016 0603   EOSABS 0.6 10/13/2016 0603   BASOSABS 0.1 10/13/2016 0603    Assessment/Plan: 1.  AKI vs. Progressive CKD stage 4- presumably this is related to progressive CKD and not an acute event given his renal US and presence of anemia, as well as his history of no medication or medical care for 5 years.  1. Bump in Scr likely due to aggressive diuresis, would decrease lasix to bid for now and follow 2. Work up supporting advanced CKD stage 4 rather than an acute event.  Continue with education and goals of therapy to delay the progression of CKD with tight BP control (goal <130/80), tight glucose control (goal Hgb A1c of <7%), avoidance of NSAIDs/Cox II I's, would hold off on ACE/ARB until he establishes regular follow up. 2. CHF- presumably due to combination of hypertensive heart disease and CKD.   1. Repeat ECHO EF 55-60% with mild-mod LVH, severely dilated left atrium, moderately increase pulmonary pressure    3. Urgent Hypertension- avoid ACE/ARB for now.  Agree with lasix and hydralazine.  Consider beta-blocker. 4. DM type 2 poorly controlled.  Per primary svc 5. Proteinuria- collect 24 hour sample. 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 Anemia of chronic kidney disease- iron stores ok but will  dose IV Feraheme x 1 and start ESA 7. Secondary HPTH- will check calcium, phos, and iPTH as well as vitamin D levels  8. Protein malnutrition- per primary svc 9. Tobacco abuse- encourage smoking cessation  Governor Rooks  Sherrine Salberg

## 2016-10-14 NOTE — Care Management Note (Signed)
Case Management Note  Patient Details  Name: Fernando Jones MRN: [id number] Date of Birth: 03-31-1961  Subjective/Objective: Provided w/health insurance resource, pcp listing-encouraged [place], [employer] med list. No further CM needs.                   Action/Plan:d/c home.   Expected Discharge Date:   (unknown)               Expected Discharge Plan:  Home/Self Care  In-House Referral:     Discharge planning Services  CM Consult  Post Acute Care Choice:    Choice offered to:     DME Arranged:    DME Agency:     HH Arranged:    HH Agency:     Status of Service:  In process, will continue to follow  If discussed at Long Length of Stay Meetings, dates discussed:    Additional Comments:  Dessa Phi, RN 10/14/2016, 1:34 PM

## 2016-10-14 NOTE — Progress Notes (Signed)
PROGRESS NOTE    Fernando Jones  GYK:[id number]  DOB: 03/01/1961  DOA: 10/12/2016 PCP: No primary care provider on file. Outpatient Specialists:   Hospital course: HPI: Fernando Jones is a 55 y.o. male with history of diabetes mellitus type 2, hypertension, hyperlipidemia who has not been taking his medications for many years and has not followed up with any PCP for at least 5 years presents to the ER because of worsening shortness of breath. Patient states over the last 2 weeks patient has noticed increasing shortness of breath with orthopnea and paroxysmal dyspnea. Today patient became short of breath even with minimal exertion. Denies chest pain and productive cough fever chills. Patient also has been noticing increasing lower extremity edema. On exam patient has significant edema of both lower extremities. Chest x-ray shows congestion and blood pressure was markedly elevated. Patient's creatinine has worsened previously been normal 5 years ago. Admission labs show creatinine of 4.1. Patient is making urine. Patient is being admitted for acute respiratory failure with anasarca and renal failure.  Assessment & Plan:   1. Acute respiratory failure with hypoxia with anasarca - most likely secondary to acute pulmonary edema with worsening renal function and uncontrolled hypertension. Improved now.  2-D echo reveals grade 3 diastolic dysfunction.  Reducing lasix to 80 mg IV BID.  Supplemental oxygen as needed.   2. Acute renal failure with possible chronic kidney disease - given the proteinuria patient probably has chronic component. Renal sonogram suggests medical renal disease. Patient is making urine. Appreciate nephrology consult.  24 hour urine studies pending.   3. Hypertensive urgency - patient was initially placed on IV nitroglycerin infusion and now on lasix and on hydralazine 25 mg by mouth 3 times a day. Closely follow blood pressure trends and may need to add more  antihypertensives if required. Per nephrologist old ACE/ARB for now until stable outpatient follow up established. 4. Anemia normocytic normochromic - see anemia panel. Will eventually need colonoscopy.   5. Diabetes mellitus type 2 - for now and place patient on sliding scale coverage. hemoglobin A1c. Is 5.3% down from 10.  Pt has been treating with diet and exercise at home.   6. Tobacco abuse - advised to quit smoking. 7. Severe Vit D Deficiency - oral supplement ordered.   8. Acute exacerbation chronic diastolic hear failure - Pt is improving with IV diuresis with lasix.  Outpatient follow up with cardiology recommended.    ECHO report:The cavity size was normal. Septal wall thickness was increased in a pattern of mild LVH with moderate hypertrophy of the posterior wall. Systolic function was normal. The   estimated ejection fraction was in the range of 55% to 60%.  Grade 3 diastolic dysfunction.   DVT prophylaxis: Heparin. Code Status: Full code.  Family Communication: Discussed with patient.  Disposition Plan: Home.  Consults called: None.  Admission status: Inpatient. Likely stay 3-4 days.   Subjective: Pt says he feels much better.  Less edema in feet.    Objective: Vitals:   10/13/16 2225 10/14/16 0631 10/14/16 1121 10/14/16 1333  BP: (!) 160/82 (!) 156/84 137/86 130/70  Pulse: 85 84    Resp: 18 16    Temp: 98.2 F (36.8 C) 98.1 F (36.7 C)    TempSrc: Oral Oral    SpO2: 100% 100%    Weight:      Height:        Intake/Output Summary (Last 24 hours) at 10/14/16 1358 Last data filed at 10/14/16 7939  Gross per 24 hour  Intake              360 ml  Output             4375 ml  Net            -4015 ml   Filed Weights   10/13/16 1225  Weight: 72.8 kg (160 lb 6.4 oz)    Exam:  General exam: awake, alert, no distress, cooperative.  Respiratory system: Clear. No increased work of breathing. Cardiovascular system: S1 & S2 heard, RRR. No JVD, murmurs, gallops,  clicks or pedal edema. Gastrointestinal system: Abdomen is nondistended, soft and nontender. Normal bowel sounds heard. Central nervous system: Alert and oriented. No focal neurological deficits. Extremities: 2+ edema bilateral LEs.  Data Reviewed: Basic Metabolic Panel:  Recent Labs Lab 10/12/16 2011 10/12/16 2330 10/13/16 0603 10/14/16 0438  NA 136  --  138 140  K 4.6  --  3.9 3.8  CL 109  --  111 111  CO2 19*  --  19* 20*  GLUCOSE 199*  --  81 80  BUN 55*  --  57* 61*  CREATININE 4.17* 4.23* 4.25* 4.79*  CALCIUM 8.2*  --  8.2* 8.2*  PHOS  --   --   --  4.2   Liver Function Tests:  Recent Labs Lab 10/12/16 2011 10/13/16 0603 10/14/16 0438  AST 27 16  --   ALT 23 20  --   ALKPHOS 114 103  --   BILITOT 1.2 0.8  --   PROT 6.2* 5.5*  --   ALBUMIN 3.1* 2.8* 2.7*   No results for input(s): LIPASE, AMYLASE in the last 168 hours. No results for input(s): AMMONIA in the last 168 hours. CBC:  Recent Labs Lab 10/12/16 2011 10/12/16 2330 10/13/16 0603 10/14/16 0438  WBC 7.7 7.0 6.9 6.6  NEUTROABS 5.5  --  4.4  --   HGB 10.2* 10.0* 9.2* 9.3*  HCT 30.1* 30.0* 27.2* 27.5*  MCV 87.2 87.5 86.9 87.6  PLT 336 337 301 299   Cardiac Enzymes:  Recent Labs Lab 10/12/16 2011 10/12/16 2330 10/13/16 0603 10/13/16 1123 10/13/16 1124  CKTOTAL  --   --   --  142  --   TROPONINI 0.04* 0.05* 0.05*  --  0.04*   CBG (last 3)   Recent Labs  10/13/16 2223 10/14/16 0759 10/14/16 1130  GLUCAP 103* 92 106*   No results found for this or any previous visit (from the past 240 hour(s)).   Studies: Dg Chest 2 View  Result Date: 10/12/2016 CLINICAL DATA:  Shortness of Breath EXAM: CHEST  2 VIEW COMPARISON:  05/09/2009 FINDINGS: Cardiac shadow is within normal limits. Mild vascular congestion is noted. Small bilateral pleural effusions are seen. Bibasilar atelectatic changes are noted worse on left than the right. IMPRESSION: Mild vascular congestion and bibasilar atelectasis  with small effusions. Electronically Signed   By: Inez Catalina M.D.   On: 10/12/2016 20:17   US Renal  Result Date: 10/13/2016 CLINICAL DATA:  Acute renal failure. History of hypertension and diabetes. EXAM: RENAL / URINARY TRACT ULTRASOUND COMPLETE COMPARISON:  Abdomen series 07/03/2011 FINDINGS: Right Kidney: Length: 10 cm. Diffusely increased parenchymal echotexture consistent with chronic medical renal disease. No solid mass or hydronephrosis. Left Kidney: Length: 9.5 cm. Diffusely increased parenchymal echotexture consistent with chronic medical renal disease. No solid mass or hydronephrosis. Bladder: No wall thickening or filling defects demonstrated. Bilateral urine flow jets demonstrated on color  flow Doppler imaging. Incidental finding of bilateral pleural effusions. IMPRESSION: Increase renal parenchymal echotexture bilaterally consistent with chronic medical renal disease. No hydronephrosis. Bilateral pleural effusions. Electronically Signed   By: Lucienne Capers M.D.   On: 10/13/2016 06:48     Scheduled Meds: . aspirin EC  81 mg Oral Daily  . carvedilol  3.125 mg Oral BID WC  . darbepoetin (ARANESP) injection - NON-DIALYSIS  40 mcg Subcutaneous Q Thu-1800  . furosemide  80 mg Intravenous BID  . heparin  5,000 Units Subcutaneous Q12H  . hydrALAZINE  25 mg Oral Q8H  . insulin aspart  0-9 Units Subcutaneous TID WC  . sodium chloride flush  3 mL Intravenous Q12H  . Vitamin D (Ergocalciferol)  50,000 Units Oral Q7 days   Continuous Infusions: . nitroGLYCERIN Stopped (10/13/16 1011)    Principal Problem:   Diastolic dysfunction with acute on chronic heart failure (HCC) Active Problems:   DM (diabetes mellitus), type 2 with renal complications (HCC)   TOBACCO ABUSE   Acute respiratory failure with hypoxia (HCC)   Anasarca   Acute renal failure (ARF) ([place])   Hypertensive urgency   Acute respiratory failure ([place])  Time spent:   Irwin Brakeman, MD, FAAFP [facility] Pager [phone]  If 7PM-7AM, please contact night-coverage [email or web address] Password [email or web address] 10/14/2016, 1:58 PM    LOS: 2 days

## 2016-10-15 DIAGNOSIS — E559 Vitamin D deficiency, unspecified: Secondary | ICD-10-CM

## 2016-10-15 LAB — RENAL FUNCTION PANEL
ALBUMIN: 2.6 g/dL — AB (ref 3.5–5.0)
Anion gap: 10 (ref 5–15)
BUN: 68 mg/dL — AB (ref 6–20)
CALCIUM: 7.8 mg/dL — AB (ref 8.9–10.3)
CO2: 21 mmol/L — ABNORMAL LOW (ref 22–32)
CREATININE: 5.2 mg/dL — AB (ref 0.61–1.24)
Chloride: 110 mmol/L (ref 101–111)
GFR calc Af Amer: 13 mL/min — ABNORMAL LOW (ref >60)
GFR, EST NON AFRICAN AMERICAN: 11 mL/min — AB (ref >60)
GLUCOSE: 76 mg/dL (ref 65–99)
PHOSPHORUS: 3.9 mg/dL (ref 2.5–4.6)
POTASSIUM: 3.8 mmol/L (ref 3.5–5.1)
SODIUM: 141 mmol/L (ref 135–145)

## 2016-10-15 LAB — GLUCOSE, CAPILLARY
Glucose-Capillary: 112 mg/dL — ABNORMAL HIGH (ref 65–99)
Glucose-Capillary: 93 mg/dL (ref 65–99)
Glucose-Capillary: 94 mg/dL (ref 65–99)
Glucose-Capillary: 171 mg/dL — ABNORMAL HIGH (ref 65–99)

## 2016-10-15 LAB — COMPLEMENT, TOTAL

## 2016-10-15 MED ORDER — CARVEDILOL 6.25 MG PO TABS
6.2500 mg | ORAL_TABLET | Freq: Two times a day (BID) | ORAL | Status: DC
  Administered 2016-10-15 – 2016-10-17 (×4): 6.25 mg via ORAL
  Filled 2016-10-15 (×4): qty 1

## 2016-10-15 MED ORDER — FUROSEMIDE 40 MG PO TABS
40.0000 mg | ORAL_TABLET | Freq: Two times a day (BID) | ORAL | Status: DC
  Administered 2016-10-15 – 2016-10-16 (×3): 40 mg via ORAL
  Filled 2016-10-15 (×3): qty 1

## 2016-10-15 NOTE — Progress Notes (Signed)
Patient ID: Barclay Lennox, male   DOB: 26-Jul-1961, 55 y.o.   MRN: [id number] S:Feels better O:BP (!) 163/78 (BP Location: Right Arm)   Pulse 80   Temp 98.4 F (36.9 C) (Oral)   Resp 18   Ht 5\' 5"  (1.651 m)   Wt 66.5 kg (146 lb 8 oz)   SpO2 97%   BMI 24.38 kg/m   Intake/Output Summary (Last 24 hours) at 10/15/16 1218 Last data filed at 10/15/16 0730  Gross per 24 hour  Intake              650 ml  Output             2725 ml  Net            -2075 ml   Intake/Output: I/O last 3 completed shifts: In: 62 [P.O.:890] Out: 5475 [Urine:5475]  Intake/Output this shift:  Total I/O In: 120 [P.O.:120] Out: 350 [Urine:350] Weight change: -6.305 kg (-13 lb 14.4 oz) Gen:WD WN HM in NAD CVS:no rub Resp:cta CLE:XNTZGY FVC:BSWHQ pedal edema   Recent Labs Lab 10/12/16 2011 10/12/16 2330 10/13/16 0603 10/14/16 0438 10/15/16 0343  NA 136  --  138 140 141  K 4.6  --  3.9 3.8 3.8  CL 109  --  111 111 110  CO2 19*  --  19* 20* 21*  GLUCOSE 199*  --  81 80 76  BUN 55*  --  57* 61* 68*  CREATININE 4.17* 4.23* 4.25* 4.79* 5.20*  ALBUMIN 3.1*  --  2.8* 2.7* 2.6*  CALCIUM 8.2*  --  8.2* 8.2* 7.8*  PHOS  --   --   --  4.2 3.9  AST 27  --  16  --   --   ALT 23  --  20  --   --    Liver Function Tests:  Recent Labs Lab 10/12/16 2011 10/13/16 0603 10/14/16 0438 10/15/16 0343  AST 27 16  --   --   ALT 23 20  --   --   ALKPHOS 114 103  --   --   BILITOT 1.2 0.8  --   --   PROT 6.2* 5.5*  --   --   ALBUMIN 3.1* 2.8* 2.7* 2.6*   No results for input(s): LIPASE, AMYLASE in the last 168 hours. No results for input(s): AMMONIA in the last 168 hours. CBC:  Recent Labs Lab 10/12/16 2011 10/12/16 2330 10/13/16 0603 10/14/16 0438  WBC 7.7 7.0 6.9 6.6  NEUTROABS 5.5  --  4.4  --   HGB 10.2* 10.0* 9.2* 9.3*  HCT 30.1* 30.0* 27.2* 27.5*  MCV 87.2 87.5 86.9 87.6  PLT 336 337 301 299   Cardiac Enzymes:  Recent Labs Lab 10/12/16 2011 10/12/16 2330 10/13/16 0603  10/13/16 1123 10/13/16 1124  CKTOTAL  --   --   --  142  --   TROPONINI 0.04* 0.05* 0.05*  --  0.04*   CBG:  Recent Labs Lab 10/14/16 0759 10/14/16 1130 10/14/16 1646 10/14/16 2211 10/15/16 0817  GLUCAP 92 106* 159* 98 112*    Iron Studies:  Recent Labs  10/13/16 0740  IRON 51  TIBC 244*  FERRITIN 38   Studies/Results: No results found. Marland Kitchen aspirin EC  81 mg Oral Daily  . carvedilol  3.125 mg Oral BID WC  . darbepoetin (ARANESP) injection - NON-DIALYSIS  40 mcg Subcutaneous Q Thu-1800  . furosemide  40 mg Oral BID  . heparin  5,000  Units Subcutaneous Q12H  . hydrALAZINE  25 mg Oral Q8H  . insulin aspart  0-9 Units Subcutaneous TID WC  . sodium chloride flush  3 mL Intravenous Q12H  . Vitamin D (Ergocalciferol)  50,000 Units Oral Q7 days    BMET    Component Value Date/Time   NA 141 10/15/2016 0343   K 3.8 10/15/2016 0343   CL 110 10/15/2016 0343   CO2 21 (L) 10/15/2016 0343   GLUCOSE 76 10/15/2016 0343   BUN 68 (H) 10/15/2016 0343   CREATININE 5.20 (H) 10/15/2016 0343   CALCIUM 7.8 (L) 10/15/2016 0343   GFRNONAA 11 (L) 10/15/2016 0343   GFRAA 13 (L) 10/15/2016 0343   CBC    Component Value Date/Time   WBC 6.6 10/14/2016 0438   RBC 3.14 (L) 10/14/2016 0438   HGB 9.3 (L) 10/14/2016 0438   HCT 27.5 (L) 10/14/2016 0438   PLT 299 10/14/2016 0438   MCV 87.6 10/14/2016 0438   MCH 29.6 10/14/2016 0438   MCHC 33.8 10/14/2016 0438   RDW 15.1 10/14/2016 0438   LYMPHSABS 1.3 10/13/2016 0603   MONOABS 0.6 10/13/2016 0603   EOSABS 0.6 10/13/2016 0603   BASOSABS 0.1 10/13/2016 0603     Assessment/Plan: 1. AKI vs. Progressive CKD stage 4- presumably this is related to progressive CKD and not an acute event given his renal US and presence of anemia, as well as his history of no medication or medical care for 5 years.  1. Bump in Scr likely due to aggressive diuresis, would decrease lasix to po bid for now and follow 2. Work up supporting advanced CKD stage 4  rather than an acute event.  Continue with education and goals of therapy to delay the progression of CKD with tight BP control (goal <130/80), tight glucose control (goal Hgb A1c of <7%), avoidance of NSAIDs/Cox II I's, would hold off on ACE/ARB until he establishes regular follow up. 2. CHF- presumably due to combination of hypertensive heart disease and CKD.  1. Repeat ECHO EF 55-60% with mild-mod LVH, severely dilated left atrium, moderately increase pulmonary pressure   3. Urgent Hypertension- avoid ACE/ARB for now. Agree with lasix and hydralazine. Consider beta-blocker. 4. DM type 2 poorly controlled. Per primary svc 5. Proteinuria- collect 24 hour sample. 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 Anemia of chronic kidney disease- iron stores ok but will dose IV Feraheme x 1 and start ESA 7. Secondary HPTH- will check calcium, phos, and iPTH as well as vitamin D levels  8. Protein malnutrition- per primary svc 9. Tobacco abuse- encourage smoking cessation  [employer] [employer] Sahiba Granholm

## 2016-10-15 NOTE — Progress Notes (Signed)
PROGRESS NOTE    [name]  KJZ:[id number]  DOB: Jun 03, 1961  DOA: 10/12/2016 PCP: No primary care provider on file. Outpatient Specialists:   Hospital course: HPI: Fernando Jones is a 55 y.o. male with history of diabetes mellitus type 2, hypertension, hyperlipidemia who has not been taking his medications for many years and has not followed up with any PCP for at least 5 years presents to the ER because of worsening shortness of breath. Patient states over the last 2 weeks patient has noticed increasing shortness of breath with orthopnea and paroxysmal dyspnea. Today patient became short of breath even with minimal exertion. Denies chest pain and productive cough fever chills. Patient also has been noticing increasing lower extremity edema. On exam patient has significant edema of both lower extremities. Chest x-ray shows congestion and blood pressure was markedly elevated. Patient's creatinine has worsened previously been normal 5 years ago. Admission labs show creatinine of 4.1. Patient is making urine. Patient is being admitted for acute respiratory failure with anasarca and renal failure.  Assessment & Plan:   1. Acute respiratory failure with hypoxia with anasarca - resolved now, most likely secondary to acute pulmonary edema with worsening renal function and uncontrolled hypertension. Improved now.  2-D echo reveals grade 3 diastolic dysfunction.  Reducing lasix doses.  Supplemental oxygen as needed.   2. Acute renal failure with chronic kidney disease (stage 4) - given the proteinuria patient probably has chronic component. Renal sonogram suggests medical renal disease. Patient is making urine. Appreciate nephrology consult.  24 hour urine studies pending.   3. Hypertensive urgency - patient was initially placed on IV nitroglycerin infusion and now on lasix and on hydralazine 25 mg by mouth 3 times a day. Closely follow blood pressure trends and may need to add more  antihypertensives if required. Per nephrologist old ACE/ARB for now until stable outpatient follow up established.  Increase coreg to 6.25 mg po BID.   4. Anemia normocytic normochromic - see anemia panel. Will eventually need colonoscopy.   5. Diabetes mellitus type 2 - for now and place patient on sliding scale coverage. hemoglobin A1c. Is 5.3% down from 10.  Pt has been treating with diet and exercise at home.   6. Tobacco abuse - advised to quit smoking. 7. Severe Vit D Deficiency - oral supplement ordered.   8. Acute exacerbation chronic diastolic hear failure - Pt is improving with IV diuresis with lasix.  Outpatient follow up with cardiology recommended. Increase coreg to 6.25 mg po BID.   ECHO report:The cavity size was normal. Septal wall thickness was increased in a pattern of mild LVH with moderate hypertrophy of the posterior wall. Systolic function was normal. The   estimated ejection fraction was in the range of 55% to 60%.  Grade 3 diastolic dysfunction.   DVT prophylaxis: Heparin. Code Status: Full code.  Family Communication: Discussed with patient.  Disposition Plan: Home.  Consults called: None.  Admission status: Inpatient.   Subjective: Pt says he feels much better.     Objective: Vitals:   10/14/16 1446 10/14/16 2204 10/15/16 0640 10/15/16 0654  BP: (!) 154/60 (!) 154/83 (!) 163/78   Pulse: 85 80 80   Resp: 20 18 18    Temp: 98.5 F (36.9 C) 98.6 F (37 C) 98.4 F (36.9 C)   TempSrc: Oral Oral Oral   SpO2: 98% 98% 97%   Weight:    66.5 kg (146 lb 8 oz)  Height:  Intake/Output Summary (Last 24 hours) at 10/15/16 1400 Last data filed at 10/15/16 0730  Gross per 24 hour  Intake              650 ml  Output             2225 ml  Net            -1575 ml   Filed Weights   10/13/16 1225 10/15/16 0654  Weight: 72.8 kg (160 lb 6.4 oz) 66.5 kg (146 lb 8 oz)    Exam:  General exam: awake, alert, no distress, cooperative.  Respiratory system: Clear.  No increased work of breathing. Cardiovascular system: S1 & S2 heard, RRR. No JVD, murmurs, gallops, clicks or pedal edema. Gastrointestinal system: Abdomen is nondistended, soft and nontender. Normal bowel sounds heard. Central nervous system: Alert and oriented. No focal neurological deficits. Extremities: 1+ pitting edema bilateral LEs.  Data Reviewed: Basic Metabolic Panel:  Recent Labs Lab 10/12/16 2011 10/12/16 2330 10/13/16 0603 10/14/16 0438 10/15/16 0343  NA 136  --  138 140 141  K 4.6  --  3.9 3.8 3.8  CL 109  --  111 111 110  CO2 19*  --  19* 20* 21*  GLUCOSE 199*  --  81 80 76  BUN 55*  --  57* 61* 68*  CREATININE 4.17* 4.23* 4.25* 4.79* 5.20*  CALCIUM 8.2*  --  8.2* 8.2* 7.8*  PHOS  --   --   --  4.2 3.9   Liver Function Tests:  Recent Labs Lab 10/12/16 2011 10/13/16 0603 10/14/16 0438 10/15/16 0343  AST 27 16  --   --   ALT 23 20  --   --   ALKPHOS 114 103  --   --   BILITOT 1.2 0.8  --   --   PROT 6.2* 5.5*  --   --   ALBUMIN 3.1* 2.8* 2.7* 2.6*   No results for input(s): LIPASE, AMYLASE in the last 168 hours. No results for input(s): AMMONIA in the last 168 hours. CBC:  Recent Labs Lab 10/12/16 2011 10/12/16 2330 10/13/16 0603 10/14/16 0438  WBC 7.7 7.0 6.9 6.6  NEUTROABS 5.5  --  4.4  --   HGB 10.2* 10.0* 9.2* 9.3*  HCT 30.1* 30.0* 27.2* 27.5*  MCV 87.2 87.5 86.9 87.6  PLT 336 337 301 299   Cardiac Enzymes:  Recent Labs Lab 10/12/16 2011 10/12/16 2330 10/13/16 0603 10/13/16 1123 10/13/16 1124  CKTOTAL  --   --   --  142  --   TROPONINI 0.04* 0.05* 0.05*  --  0.04*   CBG (last 3)   Recent Labs  10/14/16 2211 10/15/16 0817 10/15/16 1217  GLUCAP 98 112* 93   No results found for this or any previous visit (from the past 240 hour(s)).   Studies: No results found.   Scheduled Meds: . aspirin EC  81 mg Oral Daily  . carvedilol  6.25 mg Oral BID WC  . darbepoetin (ARANESP) injection - NON-DIALYSIS  40 mcg Subcutaneous Q  Thu-1800  . furosemide  40 mg Oral BID  . heparin  5,000 Units Subcutaneous Q12H  . hydrALAZINE  25 mg Oral Q8H  . insulin aspart  0-9 Units Subcutaneous TID WC  . sodium chloride flush  3 mL Intravenous Q12H  . Vitamin D (Ergocalciferol)  50,000 Units Oral Q7 days   Continuous Infusions: . nitroGLYCERIN Stopped (10/13/16 1011)    Principal Problem:   Diastolic  dysfunction with acute on chronic heart failure (HCC) Active Problems:   DM (diabetes mellitus), type 2 with renal complications (HCC)   TOBACCO ABUSE   Acute respiratory failure with hypoxia (HCC)   Anasarca   Acute renal failure (ARF) (HCC)   Hypertensive urgency   Acute respiratory failure (HCC)   Severe Vitamin D deficiency  Time spent:   Irwin Brakeman, MD, FAAFP [facility] Pager [phone]  If 7PM-7AM, please contact night-coverage [email or web address] Password [email or web address] 10/15/2016, 2:00 PM    LOS: 3 days

## 2016-10-16 LAB — RENAL FUNCTION PANEL
ANION GAP: 9 (ref 5–15)
Albumin: 2.8 g/dL — ABNORMAL LOW (ref 3.5–5.0)
BUN: 80 mg/dL — ABNORMAL HIGH (ref 6–20)
CALCIUM: 8.1 mg/dL — AB (ref 8.9–10.3)
CO2: 23 mmol/L (ref 22–32)
Chloride: 109 mmol/L (ref 101–111)
Creatinine, Ser: 5.38 mg/dL — ABNORMAL HIGH (ref 0.61–1.24)
GFR calc non Af Amer: 11 mL/min — ABNORMAL LOW (ref >60)
GFR, EST AFRICAN AMERICAN: 12 mL/min — AB (ref >60)
GLUCOSE: 90 mg/dL (ref 65–99)
PHOSPHORUS: 4.2 mg/dL (ref 2.5–4.6)
POTASSIUM: 3.9 mmol/L (ref 3.5–5.1)
SODIUM: 141 mmol/L (ref 135–145)

## 2016-10-16 LAB — GLUCOSE, CAPILLARY
GLUCOSE-CAPILLARY: 107 mg/dL — AB (ref 65–99)
Glucose-Capillary: 83 mg/dL (ref 65–99)

## 2016-10-16 MED ORDER — FUROSEMIDE 40 MG PO TABS
80.0000 mg | ORAL_TABLET | Freq: Two times a day (BID) | ORAL | Status: DC
  Administered 2016-10-16 – 2016-10-17 (×2): 80 mg via ORAL
  Filled 2016-10-16 (×2): qty 2

## 2016-10-16 MED ORDER — CALCITRIOL 0.25 MCG PO CAPS
0.2500 ug | ORAL_CAPSULE | ORAL | Status: DC
  Administered 2016-10-16: 0.25 ug via ORAL
  Filled 2016-10-16: qty 1

## 2016-10-16 MED ORDER — KIDNEY FAILURE BOOK
Freq: Once | Status: AC
  Administered 2016-10-16: 16:00:00
  Filled 2016-10-16: qty 1

## 2016-10-16 NOTE — Progress Notes (Signed)
PROGRESS NOTE    Fernando Jones  AVW:[id number]  DOB: Sep 04, 1961  DOA: 10/12/2016 PCP: No primary care provider on file. Outpatient Specialists:   Hospital course: HPI: Fernando Jones is a 55 y.o. male with history of diabetes mellitus type 2, hypertension, hyperlipidemia who has not been taking his medications for many years and has not followed up with any PCP for at least 5 years presents to the ER because of worsening shortness of breath. Patient states over the last 2 weeks patient has noticed increasing shortness of breath with orthopnea and paroxysmal dyspnea. Today patient became short of breath even with minimal exertion. Denies chest pain and productive cough fever chills. Patient also has been noticing increasing lower extremity edema. On exam patient has significant edema of both lower extremities. Chest x-ray shows congestion and blood pressure was markedly elevated. Patient's creatinine has worsened previously been normal 5 years ago. Admission labs show creatinine of 4.1. Patient is making urine. Patient is being admitted for acute respiratory failure with anasarca and renal failure.  Assessment & Plan:   1. Acute respiratory failure with hypoxia with anasarca - resolved now, most likely secondary to acute pulmonary edema with worsening renal function and uncontrolled hypertension. Improved now.  2-D echo reveals grade 3 diastolic dysfunction.  Reducing lasix doses per nephrology team, clinically improved.  Supplemental oxygen not needed.   2. Acute renal failure with chronic kidney disease (stage 4) - given the proteinuria patient probably has chronic component. Renal sonogram suggests medical renal disease. Patient is making urine. Appreciate nephrology consult.  24 hour urine studies per nephrology, pt to follow up with nephrology for further counseling, education and preparation for likely future hemodialysis.  3. Hypertensive urgency - patient was initially placed on  IV nitroglycerin infusion and now on lasix and on hydralazine 25 mg by mouth 3 times a day. Closely follow blood pressure trends and may need to add more antihypertensives if required. Per nephrologist old ACE/ARB for now until stable outpatient follow up established.  Nephrology increased coreg to 12 mg po BID.   4. Essential Hypertension - suboptimally controlled.  5. Anemia normocytic normochromic - see anemia panel. Will eventually need colonoscopy.  He was given information to establish primary care with [facility].  6. Diabetes mellitus type 2 - for now and place patient on sliding scale coverage. hemoglobin A1c. Is 5.3% down from 10.  Pt has been treating with diet and exercise at home.   7. Tobacco abuse - advised to quit smoking. 8. Severe Vit D Deficiency - oral supplement ordered.   9. Acute exacerbation chronic diastolic hear failure - Pt is improving with IV diuresis with lasix.  Outpatient follow up with cardiology recommended. Increased coreg as above.    ECHO report:The cavity size was normal. Septal wall thickness was increased in a pattern of mild LVH with moderate hypertrophy of the posterior wall. Systolic function was normal. The   estimated ejection fraction was in the range of 55% to 60%.  Grade 3 diastolic dysfunction.   DVT prophylaxis: Heparin. Code Status: Full code.  Family Communication: Discussed with patient.  Disposition Plan: Home.  Consults called: None.  Admission status: Inpatient.   Subjective: Pt says he feels better but has been avoiding drinking fluids because he thinks it will cause the swelling to return   Objective: Vitals:   10/15/16 1300 10/15/16 2219 10/16/16 0618 10/16/16 1351  BP: (!) 151/78 (!) 155/72 (!) 152/66 135/70  Pulse: 79 83 80 76  Resp: 18 18 18 18   Temp: 98.2 F (36.8 C) 98.3 F (36.8 C) 98.4 F (36.9 C) 98.7 F (37.1 C)  TempSrc: Oral Oral Oral Oral  SpO2: 98% 97% 98% 97%  Weight:   65.4 kg (144 lb 1.6 oz)   Height:         Intake/Output Summary (Last 24 hours) at 10/16/16 1357 Last data filed at 10/16/16 1030  Gross per 24 hour  Intake              480 ml  Output              750 ml  Net             -270 ml   Filed Weights   10/13/16 1225 10/15/16 0654 10/16/16 0618  Weight: 72.8 kg (160 lb 6.4 oz) 66.5 kg (146 lb 8 oz) 65.4 kg (144 lb 1.6 oz)    Exam:  General exam: awake, alert, no distress, cooperative.  Respiratory system: Clear. No increased work of breathing. Cardiovascular system: S1 & S2 heard, RRR. No JVD, murmurs, gallops, clicks or pedal edema. Gastrointestinal system: Abdomen is nondistended, soft and nontender. Normal bowel sounds heard. Central nervous system: Alert and oriented. No focal neurological deficits. Extremities: 1+ pitting edema bilateral LEs.  Data Reviewed: Basic Metabolic Panel:  Recent Labs Lab 10/12/16 2011 10/12/16 2330 10/13/16 0603 10/14/16 0438 10/15/16 0343 10/16/16 0500  NA 136  --  138 140 141 141  K 4.6  --  3.9 3.8 3.8 3.9  CL 109  --  111 111 110 109  CO2 19*  --  19* 20* 21* 23  GLUCOSE 199*  --  81 80 76 90  BUN 55*  --  57* 61* 68* 80*  CREATININE 4.17* 4.23* 4.25* 4.79* 5.20* 5.38*  CALCIUM 8.2*  --  8.2* 8.2* 7.8* 8.1*  PHOS  --   --   --  4.2 3.9 4.2   Liver Function Tests:  Recent Labs Lab 10/12/16 2011 10/13/16 0603 10/14/16 0438 10/15/16 0343 10/16/16 0500  AST 27 16  --   --   --   ALT 23 20  --   --   --   ALKPHOS 114 103  --   --   --   BILITOT 1.2 0.8  --   --   --   PROT 6.2* 5.5*  --   --   --   ALBUMIN 3.1* 2.8* 2.7* 2.6* 2.8*   No results for input(s): LIPASE, AMYLASE in the last 168 hours. No results for input(s): AMMONIA in the last 168 hours. CBC:  Recent Labs Lab 10/12/16 2011 10/12/16 2330 10/13/16 0603 10/14/16 0438  WBC 7.7 7.0 6.9 6.6  NEUTROABS 5.5  --  4.4  --   HGB 10.2* 10.0* 9.2* 9.3*  HCT 30.1* 30.0* 27.2* 27.5*  MCV 87.2 87.5 86.9 87.6  PLT 336 337 301 299   Cardiac  Enzymes:  Recent Labs Lab 10/12/16 2011 10/12/16 2330 10/13/16 0603 10/13/16 1123 10/13/16 1124  CKTOTAL  --   --   --  142  --   TROPONINI 0.04* 0.05* 0.05*  --  0.04*   CBG (last 3)   Recent Labs  10/15/16 2141 10/16/16 0814 10/16/16 1157  GLUCAP 171* 83 107*   No results found for this or any previous visit (from the past 240 hour(s)).   Studies: No results found.   Scheduled Meds: . aspirin EC  81 mg Oral Daily  .  calcitRIOL  0.25 mcg Oral Q M,W,F  . carvedilol  6.25 mg Oral BID WC  . darbepoetin (ARANESP) injection - NON-DIALYSIS  40 mcg Subcutaneous Q Thu-1800  . furosemide  80 mg Oral BID  . heparin  5,000 Units Subcutaneous Q12H  . hydrALAZINE  25 mg Oral Q8H  . insulin aspart  0-9 Units Subcutaneous TID WC  . sodium chloride flush  3 mL Intravenous Q12H   Continuous Infusions:   Principal Problem:   Diastolic dysfunction with acute on chronic heart failure (HCC) Active Problems:   DM (diabetes mellitus), type 2 with renal complications (HCC)   TOBACCO ABUSE   Acute respiratory failure with hypoxia (HCC)   Anasarca   Acute renal failure (ARF) (HCC)   Hypertensive urgency   Acute respiratory failure (HCC)   Severe Vitamin D deficiency  Time spent:   Irwin Brakeman, MD, FAAFP [facility] Pager [phone]  If 7PM-7AM, please contact night-coverage [email or web address] Password [email or web address] 10/16/2016, 1:57 PM    LOS: 4 days

## 2016-10-16 NOTE — Progress Notes (Signed)
Patient ID: Fernando Jones, male   DOB: [birth year], 55 y.o.   MRN: [id number] S:Feels tired but otherwise ok O:BP (!) 152/66 (BP Location: Left Arm)   Pulse 80   Temp 98.4 F (36.9 C) (Oral)   Resp 18   Ht 5\' 5"  (1.651 m)   Wt 65.4 kg (144 lb 1.6 oz)   SpO2 98%   BMI 23.98 kg/m   Intake/Output Summary (Last 24 hours) at 10/16/16 0828 Last data filed at 10/15/16 2230  Gross per 24 hour  Intake              480 ml  Output              400 ml  Net               80 ml   Intake/Output: I/O last 3 completed shifts: In: 34 [P.O.:890] Out: 2425 [Urine:2425]  Intake/Output this shift:  No intake/output data recorded. Weight change: -1.089 kg (-2 lb 6.4 oz) Gen:NAD CVS:no rub Resp:cta QJJ:HERDEY CXK:GYJEH pretibial edema   Recent Labs Lab 10/12/16 2011 10/12/16 2330 10/13/16 0603 10/14/16 0438 10/15/16 0343 10/16/16 0500  NA 136  --  138 140 141 141  K 4.6  --  3.9 3.8 3.8 3.9  CL 109  --  111 111 110 109  CO2 19*  --  19* 20* 21* 23  GLUCOSE 199*  --  81 80 76 90  BUN 55*  --  57* 61* 68* 80*  CREATININE 4.17* 4.23* 4.25* 4.79* 5.20* 5.38*  ALBUMIN 3.1*  --  2.8* 2.7* 2.6* 2.8*  CALCIUM 8.2*  --  8.2* 8.2* 7.8* 8.1*  PHOS  --   --   --  4.2 3.9 4.2  AST 27  --  16  --   --   --   ALT 23  --  20  --   --   --    Liver Function Tests:  Recent Labs Lab 10/12/16 2011 10/13/16 0603 10/14/16 0438 10/15/16 0343 10/16/16 0500  AST 27 16  --   --   --   ALT 23 20  --   --   --   ALKPHOS 114 103  --   --   --   BILITOT 1.2 0.8  --   --   --   PROT 6.2* 5.5*  --   --   --   ALBUMIN 3.1* 2.8* 2.7* 2.6* 2.8*   No results for input(s): LIPASE, AMYLASE in the last 168 hours. No results for input(s): AMMONIA in the last 168 hours. CBC:  Recent Labs Lab 10/12/16 2011 10/12/16 2330 10/13/16 0603 10/14/16 0438  WBC 7.7 7.0 6.9 6.6  NEUTROABS 5.5  --  4.4  --   HGB 10.2* 10.0* 9.2* 9.3*  HCT 30.1* 30.0* 27.2* 27.5*  MCV 87.2 87.5 86.9 87.6  PLT 336 337  301 299   Cardiac Enzymes:  Recent Labs Lab 10/12/16 2011 10/12/16 2330 10/13/16 0603 10/13/16 1123 10/13/16 1124  CKTOTAL  --   --   --  142  --   TROPONINI 0.04* 0.05* 0.05*  --  0.04*   CBG:  Recent Labs Lab 10/15/16 0817 10/15/16 1217 10/15/16 1715 10/15/16 2141 10/16/16 0814  GLUCAP 112* 93 94 171* 83    Iron Studies: No results for input(s): IRON, TIBC, TRANSFERRIN, FERRITIN in the last 72 hours. Studies/Results: No results found. Marland Kitchen aspirin EC  81 mg Oral Daily  . carvedilol  6.25 mg Oral BID WC  . darbepoetin (ARANESP) injection - NON-DIALYSIS  40 mcg Subcutaneous Q Thu-1800  . furosemide  40 mg Oral BID  . heparin  5,000 Units Subcutaneous Q12H  . hydrALAZINE  25 mg Oral Q8H  . insulin aspart  0-9 Units Subcutaneous TID WC  . sodium chloride flush  3 mL Intravenous Q12H  . Vitamin D (Ergocalciferol)  50,000 Units Oral Q7 days    BMET    Component Value Date/Time   NA 141 10/16/2016 0500   K 3.9 10/16/2016 0500   CL 109 10/16/2016 0500   CO2 23 10/16/2016 0500   GLUCOSE 90 10/16/2016 0500   BUN 80 (H) 10/16/2016 0500   CREATININE 5.38 (H) 10/16/2016 0500   CALCIUM 8.1 (L) 10/16/2016 0500   GFRNONAA 11 (L) 10/16/2016 0500   GFRAA 12 (L) 10/16/2016 0500   CBC    Component Value Date/Time   WBC 6.6 10/14/2016 0438   RBC 3.14 (L) 10/14/2016 0438   HGB 9.3 (L) 10/14/2016 0438   HCT 27.5 (L) 10/14/2016 0438   PLT 299 10/14/2016 0438   MCV 87.6 10/14/2016 0438   MCH 29.6 10/14/2016 0438   MCHC 33.8 10/14/2016 0438   RDW 15.1 10/14/2016 0438   LYMPHSABS 1.3 10/13/2016 0603   MONOABS 0.6 10/13/2016 0603   EOSABS 0.6 10/13/2016 0603   BASOSABS 0.1 10/13/2016 0603     Assessment/Plan: 1. Progressive CKD stage 4- presumably related to poorly controlled DM and HTN for the last 5 years (had been off of medications and not seen physicians).  1. Bump in Scr likely due to aggressive diuresis, would decrease lasix to po bid for now and follow 2. Work  up supporting advanced CKD stage 4 rather than an acute event. Continue with education and goals of therapy to delay the progression of CKD with tight BP control (goal <130/80), tight glucose control (goal Hgb A1c of <7%), avoidance of NSAIDs/Cox II I's, would hold off on ACE/ARB until he establishes regular follow up. 3. I discussed the natural progression of CKD stage 4 related to DM and HTN and discussed dialysis (hemo and peritoneal) as well as renal transplantation.  He will need to establish follow up with me as an outpatient and will begin the process of tx referral and vascular access placement and ongoing education regarding renal replacement therapies. 2. CHF- presumably due to combination of hypertensive heart disease and CKD.  1. Repeat ECHO EF 55-60% with mild-mod LVH, severely dilated left atrium, moderately increase pulmonary pressure  3. Urgent Hypertension- avoid ACE/ARB for now.  1. Agree with lasix and hydralazine.  2. Recent increase in coreg dose with some improvement but still above goal of <130/80.  Continue to titrate 3. Would increase coreg to 12.5mg  bid and consider increasing hydralazine to 37.5mg  q8. 4. Will increase lasix to 80mg  bid as he did not respond well to po at 40mg . 4. DM type 2 poorly controlled. Per primary svc 5. Proteinuria- collect 24 hour sample. 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 Anemia of chronic kidney disease-  1. S/p 1 dose IV Feraheme and started on Aranesp 56mcg and will dose every 2 weeks to goal of 10-12 7. Secondary HPTH- Ca and phos stable, elevated iPTH 1. Start calcitriol 0.17mcg every Monday Wednesday and Friday. 8. Protein malnutrition- per primary svc 9. Tobacco abuse- encourage smoking cessation 10. Disposition- hopeful discharge to home soon with f/u at our office in  2-3 weeks.  Donetta Potts, MD [employer] [phone]

## 2016-10-17 LAB — RENAL FUNCTION PANEL
ALBUMIN: 2.8 g/dL — AB (ref 3.5–5.0)
ANION GAP: 9 (ref 5–15)
BUN: 80 mg/dL — ABNORMAL HIGH (ref 6–20)
CALCIUM: 8.3 mg/dL — AB (ref 8.9–10.3)
CO2: 24 mmol/L (ref 22–32)
Chloride: 104 mmol/L (ref 101–111)
Creatinine, Ser: 5.21 mg/dL — ABNORMAL HIGH (ref 0.61–1.24)
GFR calc non Af Amer: 11 mL/min — ABNORMAL LOW (ref >60)
GFR, EST AFRICAN AMERICAN: 13 mL/min — AB (ref >60)
Glucose, Bld: 89 mg/dL (ref 65–99)
PHOSPHORUS: 4.1 mg/dL (ref 2.5–4.6)
Potassium: 4.1 mmol/L (ref 3.5–5.1)
SODIUM: 137 mmol/L (ref 135–145)

## 2016-10-17 LAB — CBC
HCT: 29 % — ABNORMAL LOW (ref 39.0–52.0)
HEMOGLOBIN: 9.8 g/dL — AB (ref 13.0–17.0)
MCH: 29.1 pg (ref 26.0–34.0)
MCHC: 33.8 g/dL (ref 30.0–36.0)
MCV: 86.1 fL (ref 78.0–100.0)
Platelets: 320 10*3/uL (ref 150–400)
RBC: 3.37 MIL/uL — AB (ref 4.22–5.81)
RDW: 14.9 % (ref 11.5–15.5)
WBC: 8.8 10*3/uL (ref 4.0–10.5)

## 2016-10-17 LAB — GLUCOSE, CAPILLARY: GLUCOSE-CAPILLARY: 80 mg/dL (ref 65–99)

## 2016-10-17 MED ORDER — CARVEDILOL 12.5 MG PO TABS: 12.5000 mg | ORAL_TABLET | Freq: Two times a day (BID) | ORAL | 0 refills | Status: DC

## 2016-10-17 MED ORDER — HYDRALAZINE HCL 25 MG PO TABS: 25.0000 mg | ORAL_TABLET | Freq: Three times a day (TID) | ORAL | 0 refills | Status: DC

## 2016-10-17 MED ORDER — CALCITRIOL 0.25 MCG PO CAPS: 0.2500 ug | ORAL_CAPSULE | ORAL | 0 refills | Status: AC

## 2016-10-17 MED ORDER — ASPIRIN 81 MG PO TBEC: 81.0000 mg | DELAYED_RELEASE_TABLET | Freq: Every day | ORAL | Status: DC

## 2016-10-17 MED ORDER — FUROSEMIDE 80 MG PO TABS: 80.0000 mg | ORAL_TABLET | Freq: Two times a day (BID) | ORAL | 0 refills | Status: DC

## 2016-10-17 MED ORDER — CARVEDILOL 12.5 MG PO TABS: 12.5000 mg | ORAL_TABLET | Freq: Two times a day (BID) | ORAL | Status: DC

## 2016-10-17 NOTE — Progress Notes (Signed)
Patient ID: Fernando Jones, male   DOB: 1961/05/23, 55 y.o.   MRN: [id number] S:Feels well no complaints O:BP (!) 168/77   Pulse 81   Temp 98.3 F (36.8 C) (Oral)   Resp 17   Ht 5\' 5"  (1.651 m)   Wt 65.4 kg (144 lb 1.6 oz)   SpO2 100%   BMI 23.98 kg/m   Intake/Output Summary (Last 24 hours) at 10/17/16 1116 Last data filed at 10/17/16 0700  Gross per 24 hour  Intake              600 ml  Output             2175 ml  Net            -1575 ml   Intake/Output: I/O last 3 completed shifts: In: 840 [P.O.:840] Out: 2825 [Urine:2825]  Intake/Output this shift:  No intake/output data recorded. Weight change:  Gen:WD WN NAD CVS:no rub Resp:cta VZD:GLOVFI EPP:IRJJO pretibial edema   Recent Labs Lab 10/12/16 2011 10/12/16 2330 10/13/16 0603 10/14/16 0438 10/15/16 0343 10/16/16 0500 10/17/16 0428  NA 136  --  138 140 141 141 137  K 4.6  --  3.9 3.8 3.8 3.9 4.1  CL 109  --  111 111 110 109 104  CO2 19*  --  19* 20* 21* 23 24  GLUCOSE 199*  --  81 80 76 90 89  BUN 55*  --  57* 61* 68* 80* 80*  CREATININE 4.17* 4.23* 4.25* 4.79* 5.20* 5.38* 5.21*  ALBUMIN 3.1*  --  2.8* 2.7* 2.6* 2.8* 2.8*  CALCIUM 8.2*  --  8.2* 8.2* 7.8* 8.1* 8.3*  PHOS  --   --   --  4.2 3.9 4.2 4.1  AST 27  --  16  --   --   --   --   ALT 23  --  20  --   --   --   --    Liver Function Tests:  Recent Labs Lab 10/12/16 2011 10/13/16 0603  10/15/16 0343 10/16/16 0500 10/17/16 0428  AST 27 16  --   --   --   --   ALT 23 20  --   --   --   --   ALKPHOS 114 103  --   --   --   --   BILITOT 1.2 0.8  --   --   --   --   PROT 6.2* 5.5*  --   --   --   --   ALBUMIN 3.1* 2.8*  < > 2.6* 2.8* 2.8*  < > = values in this interval not displayed. No results for input(s): LIPASE, AMYLASE in the last 168 hours. No results for input(s): AMMONIA in the last 168 hours. CBC:  Recent Labs Lab 10/12/16 2011 10/12/16 2330 10/13/16 0603 10/14/16 0438 10/17/16 0428  WBC 7.7 7.0 6.9 6.6 8.8  NEUTROABS  5.5  --  4.4  --   --   HGB 10.2* 10.0* 9.2* 9.3* 9.8*  HCT 30.1* 30.0* 27.2* 27.5* 29.0*  MCV 87.2 87.5 86.9 87.6 86.1  PLT 336 337 301 299 320   Cardiac Enzymes:  Recent Labs Lab 10/12/16 2011 10/12/16 2330 10/13/16 0603 10/13/16 1123 10/13/16 1124  CKTOTAL  --   --   --  142  --   TROPONINI 0.04* 0.05* 0.05*  --  0.04*   CBG:  Recent Labs Lab 10/15/16 1715 10/15/16 2141 10/16/16 0814 10/16/16 1157  10/17/16 0726  GLUCAP 94 171* 83 107* 80    Iron Studies: No results for input(s): IRON, TIBC, TRANSFERRIN, FERRITIN in the last 72 hours. Studies/Results: No results found. Marland Kitchen aspirin EC  81 mg Oral Daily  . calcitRIOL  0.25 mcg Oral Q M,W,F  . carvedilol  6.25 mg Oral BID WC  . darbepoetin (ARANESP) injection - NON-DIALYSIS  40 mcg Subcutaneous Q Thu-1800  . furosemide  80 mg Oral BID  . heparin  5,000 Units Subcutaneous Q12H  . hydrALAZINE  25 mg Oral Q8H  . sodium chloride flush  3 mL Intravenous Q12H    BMET    Component Value Date/Time   NA 137 10/17/2016 0428   K 4.1 10/17/2016 0428   CL 104 10/17/2016 0428   CO2 24 10/17/2016 0428   GLUCOSE 89 10/17/2016 0428   BUN 80 (H) 10/17/2016 0428   CREATININE 5.21 (H) 10/17/2016 0428   CALCIUM 8.3 (L) 10/17/2016 0428   GFRNONAA 11 (L) 10/17/2016 0428   GFRAA 13 (L) 10/17/2016 0428   CBC    Component Value Date/Time   WBC 8.8 10/17/2016 0428   RBC 3.37 (L) 10/17/2016 0428   HGB 9.8 (L) 10/17/2016 0428   HCT 29.0 (L) 10/17/2016 0428   PLT 320 10/17/2016 0428   MCV 86.1 10/17/2016 0428   MCH 29.1 10/17/2016 0428   MCHC 33.8 10/17/2016 0428   RDW 14.9 10/17/2016 0428   LYMPHSABS 1.3 10/13/2016 0603   MONOABS 0.6 10/13/2016 0603   EOSABS 0.6 10/13/2016 0603   BASOSABS 0.1 10/13/2016 0603    Assessment/Plan: Assessment/Plan: 1. Progressive CKD stage 4- presumably related to poorly controlled DM and HTN for the last 5 years (had been off of medications and not seen physicians).  1. Scr has stabilized  on current diuretic regimen with good diuresis.  Continue 80mg  lasix to pobid for now and follow 2. Work up supporting advanced CKD stage 4 rather than an acute event. Continue with education and goals of therapy to delay the progression of CKD with tight BP control (goal <130/80), tight glucose control (goal Hgb A1c of <7%), avoidance of NSAIDs/Cox II I's, would hold off on ACE/ARB until he establishes regular follow up. 3. I discussed the natural progression of CKD stage 4 related to DM and HTN and discussed dialysis (hemo and peritoneal) as well as renal transplantation.  He will need to establish follow up with me as an outpatient and will begin the process of tx referral and vascular access placement and ongoing education regarding renal replacement therapies. 2. CHF- presumably due to combination of hypertensive heart disease and CKD.  1. Repeat ECHO EF 55-60% with mild-mod LVH, severely dilated left atrium, moderately increase pulmonary pressure  3. Urgent Hypertension- avoid ACE/ARB for now.  1. Agree with lasix and hydralazine.  2. Recent increase in coreg dose with some improvement but still above goal of <130/80.  Continue to titrate 3. Would increase coreg to 12.5mg  bid  4. lasix to 80mg  bid as he did not respond well to po at 40mg . 4. DM type 2 poorly controlled. Per primary svc 5. Proteinuria- collect 24 hour sample. 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 Anemia of chronic kidney disease-  1. S/p 1 dose IV Feraheme and started on Aranesp 95mcg and will dose every 2 weeks to goal of 10-12 7. Secondary HPTH- Ca and phos stable, elevated iPTH 1. Start calcitriol 0.61mcg every Monday Wednesday and Friday.  8. Protein malnutrition- per primary svc 9. Tobacco abuse- encourage smoking cessation 10. Disposition- stable for discharge to home soon with f/u at our office in 2-3 weeks.  Our contact information was given to Mr.  Lewter. Donetta Potts, MD [place] [phone]

## 2016-10-17 NOTE — Care Management Note (Signed)
Case Management Note  Patient Details  Name: Isam Unrein MRN: [id number] Date of Birth: 10-27-1961  Subjective/Objective:   Acute respiratory failure with hypoxia, CKD, HTN, DM                Action/Plan: Discharge Planning: AVS reviewed:  NCM spoke to pt at bedside. Provided pt with [place] letter and explained program can be used once per year with a $3 copay for meds. Provided pt with local clinics that accept self-pay patient. Pt will call to arrange follow up appt.    Expected Discharge Date: 10/17/2016            Expected Discharge Plan:  Home/Self Care  In-House Referral:  NA  Discharge planning Services  CM Consult, Medication Assistance, [facility], [facility] Program  Post Acute Care Choice:  NA Choice offered to:  NA  DME Arranged:  N/A DME Agency:  NA  HH Arranged:  NA HH Agency:  NA  Status of Service:  Completed, signed off  If discussed at [place] of Stay Meetings, dates discussed:    Additional Comments:  Erenest Rasher, RN 10/17/2016, 4:34 PM

## 2016-10-17 NOTE — Discharge Summary (Signed)
Physician Discharge Summary  Bryar Dahms ALP:[id number] DOB: 01-May-1961 DOA: 10/12/2016  PCP: No primary care provider on file.  Admit date: 10/12/2016 Discharge date: 10/17/2016  Admitted From: Home  Disposition:  Home   Recommendations for Outpatient Follow-up:  1. Follow up with PCP in 1-2 weeks 2. Follow up with nephrologist on follow up  3. Please obtain BMP on follow up   Discharge Condition: Stable CODE STATUS: FULL Diet recommendation: Renal and carb modified  Brief/Interim Summary: Hospital course: HPI: Fernando Lynch Herrerais a 55 y.o.malewith history of diabetes mellitus type 2, hypertension, hyperlipidemia who has not been taking his medications for many years and has not followed up with any PCP for at least 5 years presents to the ER because of worsening shortness of breath. Patient states over the last 2 weeks patient has noticed increasing shortness of breath with orthopnea and paroxysmal dyspnea. Today patient became short of breath even with minimal exertion. Denies chest pain and productive cough fever chills. Patient also has been noticing increasing lower extremity edema. On exam patient has significant edema of both lower extremities. Chest x-ray shows congestion and blood pressure was markedly elevated. Patient's creatinine has worsened previously been normal 5 years ago. Admission labs show creatinine of 4.1. Patient is making urine. Patient is being admitted for acute respiratory failure with anasarca and renal failure.  Assessment & Plan:   1. Acute respiratory failure with hypoxia with anasarca- resolved now, most likely secondary to acute pulmonary edema with worsening renal function and uncontrolled hypertension. Improved now.  2-D echo reveals grade 3 diastolic dysfunction.  Reducing lasix doses per nephrology team, clinically improved.  Supplemental oxygen not needed.   2. Acute renal failure with chronic kidney disease(stage 4) - given the  proteinuria patient probably has chronic component. Renal sonogram suggests medical renal disease. Patient is making urine. Appreciate nephrology consult.  24 hour urine studies per nephrology, pt to follow up with nephrology for further counseling, education and preparation for likely future hemodialysis.  3. Hypertensive urgency- patient was initially placed on IV nitroglycerin infusion and now on lasix and on hydralazine 25 mg by mouth 3 times a day. Closely follow blood pressure trendsand may need to add more antihypertensives if required. Per nephrologist old ACE/ARB for now until stable outpatient follow up established.  Nephrology increased coreg to 12 mg po BID.   4. Essential Hypertension - suboptimally controlled.  5. Anemianormocytic normochromic - see anemia panel. Will eventually need colonoscopy.  He was given information to establish primary care with [facility].  6. Diabetes mellitus type 2- for now and place patient on sliding scale coverage. hemoglobin A1c. Is 5.3% down from 10.  Pt has been treating with diet and exercise at home.   7. Tobacco abuse- advised to quit smoking. 8. Severe Vit D Deficiency - oral supplement ordered.  Follow up with nephrology.  9. Acute exacerbation chronic diastolic hear failure - Pt is improving with IV diuresis with lasix.  Outpatient follow up with cardiology recommended. Increased coreg as above.   ECHO report:The cavity size was normal. Septal wall thickness was increased in a pattern of mild LVH with moderate hypertrophy of the posterior wall. Systolic function was normal. The estimated ejection fraction was in the range of 55% to 60%.  Grade 3 diastolic dysfunction.   DVT prophylaxis:Heparin. Code Status:Full code. Family Communication:Discussed with patient. Disposition Plan:Home. Consults called:None. Admission status:Inpatient.   Discharge Diagnoses:  Principal Problem:   Diastolic dysfunction with acute on chronic heart  failure (HCC) Active Problems:   DM (diabetes mellitus), type 2 with renal complications (HCC)   TOBACCO ABUSE   Acute respiratory failure with hypoxia (HCC)   Anasarca   Acute renal failure (ARF) (HCC)   Hypertensive urgency   Acute respiratory failure (HCC)   Severe Vitamin D deficiency  Discharge Instructions  Discharge Instructions    Diet - low sodium heart healthy    Complete by:  As directed    Increase activity slowly    Complete by:  As directed        Medication List    STOP taking these medications   glipiZIDE 10 MG tablet Commonly known as:  GLUCOTROL   lisinopril-hydrochlorothiazide 10-12.5 MG tablet Commonly known as:  PRINZIDE,ZESTORETIC   metFORMIN 1000 MG tablet Commonly known as:  GLUCOPHAGE   pravastatin 40 MG tablet Commonly known as:  PRAVACHOL   sildenafil 50 MG tablet Commonly known as:  VIAGRA     TAKE these medications   aspirin 81 MG EC tablet Take 1 tablet (81 mg total) by mouth daily.   calcitRIOL 0.25 MCG capsule Commonly known as:  ROCALTROL Take 1 capsule (0.25 mcg total) by mouth every Monday, Wednesday, and Friday. Start taking on:  10/18/2016   carvedilol 12.5 MG tablet Commonly known as:  COREG Take 1 tablet (12.5 mg total) by mouth 2 (two) times daily with a meal.   docusate sodium 100 MG capsule Commonly known as:  COLACE Take 100 mg by mouth 2 (two) times daily.   furosemide 80 MG tablet Commonly known as:  LASIX Take 1 tablet (80 mg total) by mouth 2 (two) times daily.   hydrALAZINE 25 MG tablet Commonly known as:  APRESOLINE Take 1 tablet (25 mg total) by mouth every 8 (eight) hours.      Follow-up Information    Donetta Potts, MD. Schedule an appointment as soon as possible for a visit in 2 week(s).   Specialty:  Nephrology Why:  Hospital Follow Up  Contact information: [place] [address] [phone]        [place]. Schedule an appointment as  soon as possible for a visit in 1 week(s).   Why:  Hospital Follow Up, Check blood pressure and diabetes Contact information: [place] [phone]       [facility]. Schedule an appointment as soon as possible for a visit in 1 month(s).   Why:  Establish Care for CHF Contact information: [place] [address] [phone]         No Known Allergies  Procedures/Studies: Dg Chest 2 View  Result Date: 10/12/2016 CLINICAL DATA:  Shortness of Breath EXAM: CHEST  2 VIEW COMPARISON:  05/09/2009 FINDINGS: Cardiac shadow is within normal limits. Mild vascular congestion is noted. Small bilateral pleural effusions are seen. Bibasilar atelectatic changes are noted worse on left than the right. IMPRESSION: Mild vascular congestion and bibasilar atelectasis with small effusions. Electronically Signed   By: Inez Catalina M.D.   On: 10/12/2016 20:17   US Renal  Result Date: 10/13/2016 CLINICAL DATA:  Acute renal failure. History of hypertension and diabetes. EXAM: RENAL / URINARY TRACT ULTRASOUND COMPLETE COMPARISON:  Abdomen series 07/03/2011 FINDINGS: Right Kidney: Length: 10 cm. Diffusely increased parenchymal echotexture consistent with chronic medical renal disease. No solid mass or hydronephrosis. Left Kidney: Length: 9.5 cm. Diffusely increased parenchymal echotexture consistent with chronic  medical renal disease. No solid mass or hydronephrosis. Bladder: No wall thickening or filling defects demonstrated. Bilateral urine flow jets demonstrated on color flow Doppler imaging. Incidental finding of bilateral pleural effusions. IMPRESSION: Increase renal parenchymal echotexture bilaterally consistent with chronic medical renal disease. No hydronephrosis. Bilateral pleural effusions. Electronically Signed   By: Lucienne Capers M.D.   On: 10/13/2016 06:48   (Echo,  Carotid, EGD, Colonoscopy, ERCP)    Subjective: Pt reports that he is feeling better and want to go home.  Edema in legs almost resolved completely now.    Discharge Exam: Vitals:   10/17/16 0627 10/17/16 0700  BP: (!) 162/76 (!) 168/77  Pulse: 78 81  Resp:    Temp: 98.3 F (36.8 C)    Vitals:   10/16/16 1351 10/16/16 2354 10/17/16 0627 10/17/16 0700  BP: 135/70 (!) 153/78 (!) 162/76 (!) 168/77  Pulse: 76 75 78 81  Resp: 18 17    Temp: 98.7 F (37.1 C) 98.6 F (37 C) 98.3 F (36.8 C)   TempSrc: Oral Oral Oral   SpO2: 97% 100% 100%   Weight:      Height:       General: Pt is alert, awake, not in acute distress Cardiovascular: RRR, S1/S2 +, no rubs, no gallops Respiratory: CTA bilaterally, no wheezing, no rhonchi Abdominal: Soft, NT, ND, bowel sounds + Extremities: trace pretibial edema, no cyanosis  The results of significant diagnostics from this hospitalization (including imaging, microbiology, ancillary and laboratory) are listed below for reference.     Microbiology: No results found for this or any previous visit (from the past 240 hour(s)).   Labs: BNP (last 3 results)  Recent Labs  10/12/16 2011  BNP 9,892.1*   Basic Metabolic Panel:  Recent Labs Lab 10/13/16 0603 10/14/16 0438 10/15/16 0343 10/16/16 0500 10/17/16 0428  NA 138 140 141 141 137  K 3.9 3.8 3.8 3.9 4.1  CL 111 111 110 109 104  CO2 19* 20* 21* 23 24  GLUCOSE 81 80 76 90 89  BUN 57* 61* 68* 80* 80*  CREATININE 4.25* 4.79* 5.20* 5.38* 5.21*  CALCIUM 8.2* 8.2* 7.8* 8.1* 8.3*  PHOS  --  4.2 3.9 4.2 4.1   Liver Function Tests:  Recent Labs Lab 10/12/16 2011 10/13/16 0603 10/14/16 0438 10/15/16 0343 10/16/16 0500 10/17/16 0428  AST 27 16  --   --   --   --   ALT 23 20  --   --   --   --   ALKPHOS 114 103  --   --   --   --   BILITOT 1.2 0.8  --   --   --   --   PROT 6.2* 5.5*  --   --   --   --   ALBUMIN 3.1* 2.8* 2.7* 2.6* 2.8* 2.8*   No results for input(s): LIPASE,  AMYLASE in the last 168 hours. No results for input(s): AMMONIA in the last 168 hours. CBC:  Recent Labs Lab 10/12/16 2011 10/12/16 2330 10/13/16 0603 10/14/16 0438 10/17/16 0428  WBC 7.7 7.0 6.9 6.6 8.8  NEUTROABS 5.5  --  4.4  --   --   HGB 10.2* 10.0* 9.2* 9.3* 9.8*  HCT 30.1* 30.0* 27.2* 27.5* 29.0*  MCV 87.2 87.5 86.9 87.6 86.1  PLT 336 337 301 299 320   Cardiac Enzymes:  Recent Labs Lab 10/12/16 2011 10/12/16 2330 10/13/16 0603 10/13/16 1123 10/13/16 1124  CKTOTAL  --   --   --  142  --   TROPONINI 0.04* 0.05* 0.05*  --  0.04*   BNP: Invalid input(s): POCBNP CBG:  Recent Labs Lab 10/15/16 1715 10/15/16 2141 10/16/16 0814 10/16/16 1157 10/17/16 0726  GLUCAP 94 171* 83 107* 80   D-Dimer No results for input(s): DDIMER in the last 72 hours. Hgb A1c No results for input(s): HGBA1C in the last 72 hours. Lipid Profile No results for input(s): CHOL, HDL, LDLCALC, TRIG, CHOLHDL, LDLDIRECT in the last 72 hours. Thyroid function studies No results for input(s): TSH, T4TOTAL, T3FREE, THYROIDAB in the last 72 hours.  Invalid input(s): FREET3 Anemia work up No results for input(s): VITAMINB12, FOLATE, FERRITIN, TIBC, IRON, RETICCTPCT in the last 72 hours. Urinalysis    Component Value Date/Time   COLORURINE YELLOW 10/12/2016 2200   APPEARANCEUR CLEAR 10/12/2016 2200   LABSPEC 1.018 10/12/2016 2200   PHURINE 6.0 10/12/2016 2200   GLUCOSEU 100 (A) 10/12/2016 2200   HGBUR SMALL (A) 10/12/2016 2200   BILIRUBINUR NEGATIVE 10/12/2016 2200   KETONESUR NEGATIVE 10/12/2016 2200   PROTEINUR >300 (A) 10/12/2016 2200   NITRITE NEGATIVE 10/12/2016 2200   LEUKOCYTESUR NEGATIVE 10/12/2016 2200   Sepsis Labs Invalid input(s): PROCALCITONIN,  WBC,  LACTICIDVEN Microbiology No results found for this or any previous visit (from the past 240 hour(s)).  Time coordinating discharge: 32 mins  SIGNED:  Irwin Brakeman, MD  [facility] 10/17/2016, 1:58  PM Pager   If 7PM-7AM, please contact night-coverage [email or web address] Password [email or web address]

## 2016-10-17 NOTE — Discharge Instructions (Signed)
Enfermedad renal crnica (Chronic Kidney Disease) La enfermedad renal crnica se produce cuando los riones sufren un dao durante un largo perodo. Los riones son dos rganos que se encuentran a cada lado de la columna vertebral entre el centro de la espalda y la parte de adelante del abdomen. Los riones:  [employer] Partners y el exceso de agua de la [place].  Producen hormonas importantes. Ayudan a H[employer]el Foods, a regular la presin arterial y a formar glbulos rojos.  Regulan los lquidos y los productos qumicos en la sangre y los tejidos. Un dao pequeo puede no causar problemas, pero un dao mayor puede impedir que los riones funcionen de la [place]. Si no se toman medidas para frenar el dao renal o evitar que empeore, los riones pueden dejar de funcionar de forma [place]. La mayora de las veces, la enfermedad renal crnica no se cura. Sin embargo, muchas veces se puede Chief Technology Officer, y los que la sufren, por lo general pueden llevar una vida normal. CAUSAS Las causas ms comunes de la enfermedad renal crnica son la diabetes y la presin arterial alta (hipertensin). Otras causas de enfermedad renal crnica pueden ser:  [employer] filtros del rin.  Enfermedades que afectan el sistema inmunitario.  Enfermedades genticas.  Medicamentos que daan los riones, como los antiinflamatorios no esteroides [place]).  Envenenamiento o exposicin a sustancias txicas.  Una infeccin urinaria o renal recurrente.  Problemas en el flujo de orina. Las causas pueden ser las siguientes:  Hotel manager.  Clculos en el rin.  La prstata agrandada en los hombres. SIGNOS Y SNTOMAS Debido a que el dao renal en la enfermedad renal crnica se produce gradualmente, los sntomas se desarrollan lentamente y pueden no ser evidentes hasta que el dao renal es grave. Una persona puede tener una enfermedad renal durante aos sin mostrar ningn sntoma. Entre  los sntomas se pueden incluir los siguientes:  Hinchazn (edema) de las piernas, los tobillos o los pies.  Cansancio (letargo).  Nuseas o vmitos.  Confusin.  Problemas en la miccin, tales como:  Disminucin de la produccin de [place].  Aumento de los deseos de orinar, especialmente por la noche.  Accidentes frecuentes en los nios que saben ir al bao solos (controlan los esfnteres).  Contracciones o calambres musculares.  Falta de aire.  Debilidad.  Picazn persistente.  Prdida del apetito.  Gusto metlico en la boca  Dificultad para dormir.  Movimientos lentos en los nios.  [place] [employer]. DIAGNSTICO La enfermedad renal crnica se Emergency planning/management officer y diagnosticar mediante estudios que incluyen anlisis de [place], de [place], diagnsticos por imgenes, o una biopsia de rin. TRATAMIENTO La mayora de las enfermedades renales crnicas no pueden curarse. El tratamiento generalmente incluye el alivio de los sntomas y prevenir o Lexicographer la progresin de la enfermedad. El tratamiento puede incluir lo siguiente:  Una dieta especial. Es posible que tenga que evitar el alcohol y los alimentos con alto contenido de sal y Field seismologist.  Medicamentos. Pueden ser:  Controlar la presin arterial.  Mejorar la anemia.  Mejorar la hinchazn.  Proteger los Aff[employer] Computer Services. INSTRUCCIONES PARA EL CUIDADO EN EL HOGAR  Siga la dieta que le han indicado. El mdico puede indicarle que limite la ingesta diaria de sal (sodio) y protenas.  Tome los medicamentos solamente como se lo haya indicado el mdico. No utilice ningn medicamento nuevo (recetado, de venta libre o suplementos nutricionales), excepto si el mdico lo aprueba. Muchos medicamentos pueden empeorar la enfermedad renal, o  ser necesario ajustar las dosis.   Si fuma, deje de hacerlo. Converse con su mdico acerca de los programas para dejar de fumar.  Concurra a todas las visitas de control como se lo haya  indicado el mdico.  Contrlese la presin arterial.  Comience o contine un plan de ejercicios.  Aplquese las vacunas como se lo haya indicado el mdico.  Tome los suplementos vitamnicos y minerales como se lo haya indicado el mdico. SOLICITE ATENCIN MDICA DE INMEDIATO SI:  Empeora o desarrolla nuevos sntomas.  Aparecen sntomas de enfermedad renal en etapa terminal. Estos incluyen los siguientes:  Dolores de [place].  La piel se oscurece o se aclara de manera anormal.  Entumecimiento en las manos o en los pies.  Aparecen hematomas con facilidad.  Hipo frecuente.  Se detiene la menstruacin.  Tiene fiebre.  Disminuye la produccin de [place].  Siente dolor o tiene una hemorragia al Continental Airlines. ASEGRESE DE QUE:  Comprende estas instrucciones.  Controlar su afeccin.  Recibir ayuda de inmediato si no mejora o si empeora. PARA OBTENER MS INFORMACIN   Asociacin Americana de Pacientes Renales (American Association of Kidney Patients, AAKP): BombTimer.gl  [place] ([place]): www.kidney.org  Fondo Americano para Problemas Renales ([place]): https://mathis.com/  Programa de rehabilitacin de Life Options: www.lifeoptions.org y www.kidneyschool.org   Esta informacin no tiene Marine scientist el consejo del mdico. Asegrese de hacerle al mdico cualquier pregunta que tenga.   Document Released: 02/18/2009 Document Revised: 12/13/2014 Elsevier Interactive Patient Education 2016 [place].  Uremia (Uremia) La uremia se produce cuando el nivel de urea en sangre es ms alto que lo habitual. La urea es un residuo que suele eliminarse de la sangre a travs de la orina. Normalmente, los riones [employer]na y producen [place]. Los riones tambin producen mensajeros qumicos (hormonas). Las hormonas ayudan a Theatre manager el equilibrio de los dems qumicos de la sangre (electrolitos), como el potasio y [place]. La  uremia se desarrolla cuando los riones pierden gran parte de su capacidad de filtrar la sangre y no funcionan adecuadamente. Esto se denomina insuficiencia renal. Cuando se produce insuficiencia renal, la urea y otros residuos se acumulan en la sangre y se desequilibran los [place] de otras hormonas y Brewing technologist. La insuficiencia renal puede ocurrir sbitamente (insuficiencia renal aguda) o en el transcurso de un perodo prolongado (insuficiencia renal crnica). Ambos tipos pueden causar uremia, pero es ms frecuente con la insuficiencia renal crnica.  CAUSAS  La diabetes es la causa ms frecuente de la insuficiencia renal y la uremia. La segunda causa ms frecuente es la presin arterial alta. Otras causas son:  Infeccin.  Traumatismos.  Tumor.  Enfermedad renal heredada.  Medicamentos, incluidos los antiinflamatorios no esteroides (Dayna Ramus), y ciertos antibiticos.  Anormalidades del sistema de defensa del organismo (enfermedades autoinmunitarias).  Reduccin del suministro de sangre a los riones. FACTORES DE RIESGO El factor de riesgo ms importante es la diabetes o la presin arterial alta no controladas. Otros factores de riesgo son:  Ser afroamericano o Theatre stage manager.  Ser varn.  Ser mayor de 28aos. SIGNOS Y SNTOMAS La uremia causa muchos sntomas porque los riones intervienen en muchas funciones corporales. Los sntomas ms frecuentes de la uremia son:  Caro Hight cansado o sin energa.  Falta de apetito.  Tener ms sed que lo habitual.  Nuseas o vmitos.  Dolores musculares.  Picazn en la piel.  Dolor de [place].  Piernas inquietas.  Cambios en la visin.  Depresin.  Dificultad  para dormir. DIAGNSTICO  El mdico podr hacer el diagnstico de uremia en base a sus sntomas y al examen fsico. Tambin le harn estudios para confirmar el diagnstico de uremia y para Teacher, adult education si los riones filtran la sangre [place]. Estos estudios pueden  incluir:  Anlisis de sangre y [place] para verificar el nivel de creatinina. La creatinina es un residuo que los riones suelen filtrar de la [place].  Puede tener uremia si no tiene suficiente creatinina en la orina y [place] creatinina en la sangre.  El mdico podr estimar si los riones funcionan bien en base a la cantidad de Human resources officer.  Anlisis de [place] para [employer] Corporation de protenas, partculas de [place] y otras sustancias en la orina que en general no pasan a travs de los riones.  Un estudio de diagnstico por imgenes mediante ondas sonoras y Furniture conservator/restorer computadora (ecografa) que genera una imagen del rin.  Biopsia renal. El mdico usar una aguja para extraer una pequea muestra de tejido del rin para observarla bajo un microscopio. Esta es la mejor forma de diferenciar entre la insuficiencia renal aguda y crnica.  Tambin pueden hacerle otros tipos de anlisis de [place] y estudios de diagnstico por imgenes. TRATAMIENTO  En general, el tratamiento implica algn tipo de reemplazo de los riones. [employer] opciones son un tratamiento que filtra la sangre (dilisis) o un trasplante de rin. Otros tratamientos pueden ser:   La extraccin de ciertas glndulas del cuello (glndulas paratiroideas). Estas contribuyen a la picazn en la uremia.  Recibir un tipo de medicamento llamado agente estimulante de la eritropoyesis (AEE) para prevenir la prdida de glbulos rojos (anemia) que suele producirse con la uremia.  Recibir un medicamento para corregir los niveles de fsforo y calcio. La uremia puede hacer que se acumulen en la sangre.  Puede recibir otros tratamientos segn el tipo de insuficiencia renal que tenga y los problemas que [place]. INSTRUCCIONES PARA EL CUIDADO EN EL HOGAR  Despus de que le diagnostican uremia, la dieta y la nutricin son parte significativa del cuidado en el hogar, independientemente de los tratamientos que requiera. Siga todas las indicaciones  de su mdico.  Tome los medicamentos solamente como se lo haya indicado el mdico.  No fumar.  Limite la cantidad de alcohol que bebe.  Consulte a un nutricionista especializado en salud renal para asegurarse de seguir la mejor dieta posible.  Consuma poca sal y grasas.  Haga ejercicio la [employer] de la semana.  Las caminatas breves son Ardelia Mems buena opcin.  Consulte al mdico qu ejercicios son seguros para usted.  Concurra a todas las visitas de control. SOLICITE ATENCIN MDICA SI:  Tiene escalofros o fiebre.  Tiene problemas para controlar los sntomas. SOLICITE ATENCIN MDICA DE INMEDIATO SI:  Tiene fiebre o sntomas persistentes durante ms de 2a 3das.  Desarrolla algn sntoma nuevo y problemtico.  Tiene vmitos que no se calman.  Observa sangre en el vmito.  Tiene dificultad para respirar.  Siente dolor en el pecho.   Esta informacin no tiene Marine scientist el consejo del mdico. Asegrese de hacerle al mdico cualquier pregunta que tenga.   Document Released: 11/27/2013 Elsevier Interactive Patient Education 2016 [employer].   Hipertensin (Hypertension) El trmino hipertensin es otra forma de denominar a la presin arterial elevada. La presin arterial elevada fuerza al corazn a trabajar ms para bombear la sangre. Una lectura de la presin arterial consta de dos nmeros: uno ms alto sobre uno ms bajo (por  ejemplo, 110/72). CUIDADOS EN EL HOGAR   Haga que el mdico le tome nuevamente la presin arterial.  Tome los medicamentos solamente como se lo haya indicado el mdico. Siga cuidadosamente las indicaciones. Los medicamentos pierden eficacia si omite dosis. El hecho de omitir las dosis tambin [place] el riesgo de otros problemas.  No fume.  Contrlese la presin arterial en su casa como se lo haya indicado el mdico. SOLICITE AYUDA SI:  Piensa que tiene una reaccin a los medicamentos que est tomando.  Tiene mareos o  dolores de cabeza reiterados.  Se le inflaman (hinchan) los tobillos.  Tiene problemas de visin. SOLICITE AYUDA DE INMEDIATO SI:   Tiene un dolor de cabeza muy intenso y est confundido.  Se siente dbil, aturdido o se desmaya.  Tiene dolor en el pecho o el estmago (abdominal).  Tiene vmitos.  No puede respirar [employer]. ASEGRESE DE QUE:   Comprende estas instrucciones.  Controlar su afeccin.  Recibir ayuda de inmediato si no mejora o si empeora.   Esta informacin no tiene Marine scientist el consejo del mdico. Asegrese de hacerle al mdico cualquier pregunta que tenga.   Document Released: 05/12/2010 Document Revised: 11/27/2013 Elsevier Interactive Patient Education 2016 [place] nivel de glucosa en la sangre - Adultos (Blood Glucose Monitoring, Adult) El control de la glucosa en la sangre (tambin llamada azcar en la sangre) lo ayudar a tener la diabetes bajo control. Tambin ayuda a que usted y Chief Financial Officer la diabetes y determinen si el tratamiento es Armed forces logistics/support/administrative officer. POR QU HAY QUE CONTROLAR LA GLUCOSA EN LA SANGRE?  Esto puede ayudar a comprender de [employer], la actividad fsica y los medicamentos inciden en los niveles de [place].  Le permite conocer el nivel de glucosa en la sangre en cualquier momento dado. Puede saber rpidamente si el nivel es bajo (hipoglucemia) o alto (hiperglucemia).  Puede ser de ayuda para que usted y el mdico sepan cmo Water engineer,  y para entender cmo controlar una enfermedad o ajustar los medicamentos para hacer ejercicio. CUNDO DEBE HACERSE LAS PRUEBAS? El mdico lo ayudar a decidir con qu frecuencia deber [employer] niveles de glucosa en la [place]. Esto puede depender del tipo de diabetes que tenga, su control de la diabetes o los tipos de medicamentos que tome. Asegrese de anotar todos los valores de la glucosa en la [place], de modo que esta informacin pueda  ser revisada por su mdico. A continuacin puede ver ejemplos de los momentos para Optometrist la prueba que el mdico puede Event organiser. Diabetes tipo1  Mdaselo al menos 2 veces al da si la diabetes est bien controlada, si [place] una bomba de insulina o si se aplica muchas inyecciones diarias.  Si la diabetes no est bien controlada o si est enfermo, puede ser necesario que se controle con ms frecuencia.  Es recomendable que tambin lo mida en estas oportunidades:  Antes de cada inyeccin de insulina.  Antes y despus de hacer ejercicio.  [place] comidas y 2horas despus de Scientist, research (physical sciences).  Ocasionalmente, entre las 2:00a.m. y las 3:00a.m. Diabetes tipo2  Si est utilizando insulina, realice la medicin al menos 2 veces al [employer]. Sin embargo, es Multimedia programmer medicin antes de cada inyeccin de [place].  Si toma medicamentos por boca (va oral), hgase la prueba 2veces por da.  Si sigue una dieta controlada, hgase la prueba una vez por da.  Si la diabetes no est bien controlada o si  est enfermo, puede ser necesario que se controle con ms frecuencia. CMO CONTROLAR EL NIVEL DE GLUCOSA EN LA SANGRE Insumos necesarios  Medidor de glucosa en la sangre.  Tiras reactivas para el medidor. Cada medidor tiene sus propias tiras reactivas. [place] tiras reactivas correspondientes a su medidor.  Una aguja para pinchar (lanceta).  Un dispositivo que sujeta la lanceta (dispositivo de puncin).  Un diario o libro de anotaciones para [employer]. Procedimiento  Lave sus manos con agua y [place]. No se recomienda usar alcohol.  Pnchese el costado del dedo (no la punta) con Retail buyer.  Apriete suavemente el dedo hasta que aparezca una pequea gota de [place].  Siga las instrucciones que vienen con el medidor para Garment/textile technologist tira Comptroller, Midwife la sangre sobre la tira y usar el medidor de Printmaker. Otras zonas de las que se puede tomar sangre para la  prueba Algunos medidores le permiten tomar sangre para la prueba de otras zonas del cuerpo (que no son el dedo). Estas reas se llaman sitios alternativos. Los sitios alternativos ms comunes son los siguientes:  El Management consultant.  El muslo.  La zona posterior de la parte inferior de la pierna.  La palma de la mano. El flujo de sangre en estas zonas es ms lento. Por lo tanto, los valores de glucosa en la sangre que obtenga pueden estar demorados, y los nmeros son diferentes de los que obtiene de los dedos. No saque sangre de sitios alternativos si cree que tiene hipoglucemia. Los valores no sern precisos. Siempre extraiga del dedo si tiene hipoglucemia. Adems, si no puede darse cuenta cuando tiene bajos los niveles (hipoglucemia asintomtica), siempre extraiga sangre de los dedos para los controles de glucosa en la [place]. CONSEJOS ADICIONALES PARA EL CONTROL DE LA GLUCOSA  No vuelva a [place] lancetas.  Siempre tenga los insumos a mano.  Todos los medidores de glucosa incluyen un nmero de telfono "directo", disponible las 24 horas, al que podr llamar si tiene preguntas o [place].  Ajuste (calibre) el medidor de glucosa con una solucin de control despus de terminar algunas cajas de tiras reactivas. LLEVE REGISTROS DE LOS NIVELES DE GLUCOSA EN LA SANGRE Es recomendable llevar un diario o un registro de los valores de glucosa en la [place]. La State Farm de los medidores de glucosa, sino todos, conservan el registro de la glucosa en el dispositivo. Algunos medidores permiten descargar los registros a su computadora. Llevar un registro de los valores de glucosa en la sangre es especialmente til si desea observar los patrones. Haga anotaciones simultneas con la Teacher, English as a foreign language de los valores de glucosa en la sangre debido a que podra olvidar lo que ocurri en el momento exacto. Llevar un buen registro los ayudar a usted y al mdico a Fish farm manager juntos para Scientist, forensic un buen control de la  diabetes.    Esta informacin no tiene Marine scientist el consejo del mdico. Asegrese de hacerle al mdico cualquier pregunta que tenga.   Document Released: 11/22/2005 Document Revised: 12/13/2014 Elsevier Interactive Patient Education 2016 [place]  (Hemodialysis)  La hemodilisis es una forma de eliminar los desechos, y la sal y el agua en exceso de la [place]. Se realiza cuando los riones no pueden mantener la sangre limpia. Durante la hemodilisis, la [place]ar[place] manager del cuerpo [employer]. Un filtro en la mquina limpia la [place]. La hemodilisis se realiza 3 veces por semana. Cada sesin dura entre 3 y 55  horas. ANTES DEL PROCEDIMIENTO  Se realizar una incisin para permitir la extraccin y luego reinfusin de la sangre de su cuerpo (acceso). Se realiza semanas o meses antes de iniciar la hemodilisis. En general se realiza en el brazo.  Si usted necesita la hemodilisis de inmediato, Barista un tubo delgado y flexible (catter) en el cuello, el trax o la ingle. Este tipo de acceso es generalmente transitorio.  PROCEDIMIENTO  La hemodilisis se realiza mientras se est sentado o recostado. Durante la hemodilisis puede Oceanographer [place], siempre y cuando permanezca sentado o recostado. Muchas personas duermen, ven la televisin o leen. Si usted tiene [employer] Corporation o se siente incmodo, comntelo al mdico. El mdico puede hacer cambios para ayudarlo a sentirse ms cmodo.  Sus sesiones de hemodilisis pueden tener estas caractersticas:  1. Se lo pesar y se Curator. Controlarn su presin arterial y el pulso. 2. Se esterilizar la piel de alrededor del acceso vascular. 3. Inyectarn dos agujas en el [place]. Las agujas estarn conectadas a un tubo de plstico. Se pegarn con cinta en la piel para que no se muevan. Si usted tiene Clinical cytogeneticist transitorio, Print production planner se conecta a un tubo de plstico. 4. La sangre pasar por  el tubo a la mquina. La [place]. Luego la sangre volver a su cuerpo. Controlarn su presin arterial y el pulso algunas veces. 5. Una vez completado el procedimiento, se retiran las agujas. Se colocar un vendaje (apsito) sobre el [employer] [employer]. Si el acceso es un catter, Chief Financial Officer. DESPUS DEL PROCEDIMIENTO   Se lo pesar.  Se analizar la sangre. Esto se hace generalmente una vez al mes.  Podr experimentar efectos secundarios. Estos incluyen:  Mareos.  Calambres musculares.  Ganas de vomitar (nuseas).  Dolores de [place].  Sensacin de cansancio. Por lo general volver a sentirse normal al da siguiente.  Picazn. Su mdico puede darle medicamentos para calmarla.  Dolor o temblores en las piernas. Puede sentir como que sus piernas patean. Esto puede causar problemas para dormir.  Reacciones alrgicas. ASEGRESE DE QUE:   Comprende estas instrucciones.  Controlar su enfermedad.  Solicitar ayuda de inmediato si no mejora o si empeora.   Esta informacin no tiene Marine scientist el consejo del mdico. Asegrese de hacerle al mdico cualquier pregunta que tenga.   Document Released: 03/09/2011 Document Revised: 03/19/2013 Elsevier Interactive Patient Education 2016 [place] para dejar de fumar  (Steps to Quit Smoking)  El tabaco puede ser perjudicial para la salud y Print production planner casi cualquier rgano del cuerpo. Fumar aumenta el riesgo de sufrir muchas enfermedades crnicas graves, no solo para el fumador, sino para las personas que lo rodean. Dejar de fumar es difcil, pero es una de las mejores cosas que puede hacer por su salud. Nunca es muy tarde para dejar de fumar. [place]? Al dejar de fumar, se reduce el riesgo de contraer enfermedades y afecciones graves, como las siguientes:  Enfermedad o cncer de pulmn, por ejemplo, EPOC.  Cardiopata.  Ictus.  Infarto de  miocardio.  Infertilidad.  Osteoporosis y fracturas en los huesos. Adems, los sntomas como la tos, la sibilancia y la falta de aire pueden mejorar si deja de fumar. Tambin puede notar que se enferma menos, porque el organismo tiene ms fuerzas para combatir resfros e infecciones. Si est embarazada, dejar de fumar la ayudar a reducir las probabilidades de tener un beb de  bajo peso al [employer]. [place] ME PREPARO PARA [place]? Cuando decida dejar de fumar, cree un plan que le asegure un resultado satisfactorio. Antes de dejar de fumar, haga lo siguiente:  Elija una fecha para dejar de fumar. Fije una fecha que est [place] semanas siguientes, as tiene tiempo para prepararse.  Escriba las razones por las que quiere dejar de Copy. Mantenga esta lista en lugares donde la vea con frecuencia, por ejemplo, en el espejo del bao, en el automvil o en la billetera.  Identifique las personas, los lugares, las cosas y las actividades que hacen que tenga deseos de fumar (factores desencadenantes) y evtelos. Asegrese de tomar estas medidas:  Deseche todos los cigarrillos que haya en su casa, en el trabajo y en su automvil.  Deseche los elementos relacionados con el cigarrillo, como los ceniceros y los encendedores.  Limpie el automvil y asegrese de Engineer, manufacturing.  Limpie su casa, incluidas las cortinas y las alfombras.  [place] a sus familiares, amigos y compaeros de trabajo que est dejando de fumar. El apoyo de sus seres queridos puede facilitarle esta tarea.  Hable con su mdico sobre las opciones para dejar de fumar.  Averige las opciones de tratamiento que cubre su seguro mdico. QU [place] PARA [place]?  Hable con su mdico sobre las diferentes estrategias para dejar de fumar. Entre ellas, se incluyen las siguientes:  Dejar de fumar de forma definitiva en lugar de ir reduciendo gradualmente la cantidad de cigarrillos durante un  perodo. Las investigaciones demuestran que dejar de fumar "en seco" es ms exitoso que hacerlo de forma gradual.  Recibir asesoramiento psicolgico individual como ayuda para Actor tcnicas de resolucin de problemas. Es ms probable que tenga xito si asiste a diversas sesiones de asesoramiento psicolgico. Incluso las sesiones breves de 27mnutos pueden ser eficaces.  Hallar recursos y sistemas de apoyo que ayuden a dPersonal assistantde fCopy y a no rInsurance risk surveyorhbito ms adelante. Estos recursos son ms tiles cuando se los uSouth Georgia and the South Sandwich Islandscon frecuencia. Pueden incluir los siguientes:  Charlas en lnea con un consejero.  Lneas telefnicas para dejar de fumar.  Materiales impresos de a[place]  Grupos de apoyo o asesoramiento psicolgico grupal.  Programas de mensajes de texto.  Aplicaciones para telfonos celulares.  Tomar medicamentos como ayuda para dejar de fumar. (Si est embarazada o amamantando, hable con su mdico primero). Algunos medicamentos contienen nicotina, pero otros no. Ambos tipos de medicamentos reducen el impulso de fumar, pero los medicamentos con nicotina ayudan a aPublic house managerlos sntomas de abstinencia. El mdico podr indicar lo siguiente:  [place], goma de mHigher education careers advisero parches de nicotina.  Inhaladores o aerosoles con nicotina.  Medicamentos sin nicotina que se toman por va oral. Hable con su mdico sobre la posibilidad de cChartered loss adjuster por ejemplo, tomar medicamentos mientras recibe asesoramiento psicolgico individual. La combinacin de estas dos estrategias aumenta las probabilidades de dejar de fumar en comparacin con el uso de cualquiera de ellas de forma aislada. Si est embarazada o amamantando, pregntele a su mdico cmo hallar asesoramiento psicolgico o estrategias de apoyo para dejar de fumar. No tome medicamentos para dejar de fumar a menos que el mdico se lo haya indicado. QU PUEDO HACER PARA QUE DEJAR DE FUMAR SEA MS FCIL? Al principio, dejar de  fumar puede parecer abrumador, pero hay muchas opciones que facilitan el [place] estas importantes medidas:  Comunquese con su familia y sus amigos, y pdales que lo apoyen y aPsychologist, sport and exercise  durante [employer]. Llame a las lneas telefnicas que ayudan a dejar de fumar, pngase en contacto con grupos de apoyo o reciba asesoramiento de un consejero.  Pdale a la gente que fuma que evite hacerlo cerca suyo.  Evite los lugares que desencadenan las ganas de fumar, [place] bares, las fiestas o las zonas habilitadas para fumar en el [place].  Pase tiempo con personas que no fuman.  Reduzca el estrs en su vida, ya que este puede ser un factor que desencadena el deseo de fumar en [employer]. Para reducir el estrs, intente lo siguiente:  Hacer actividad fsica con regularidad.  Practicar ejercicios de respiracin profunda.  Practicar yoga.  Meditar.  Realizar una visualizacin corporal. Para ello, cierre los ojos, visualice su cuerpo desde la cabeza [employer] dedos de los pies y fjese qu partes del cuerpo estn especialmente tensas. Relaje especficamente los msculos de esas reas.  Descargue o compre aplicaciones para telfonos celulares o tablets que envan recordatorios, consejos y otros recursos de 61 para respetar el plan de dejar de fumar. Hay muchas aplicaciones gratuitas, como QuitGuide de los Centros para el Control y la Prevencin de Arboriculturist (CDC, Doctor, hospital for [employer] and Prevention). Puede hallar otros recursos de apoyo para dejar de fumar en smokefree.gov y en otros sitios web. [place] ME SENTIR CUANDO [place]? En las primeras 24horas despus de haber dejado de fumar, quizs tenga algunos sntomas de abstinencia. Por lo general, estos sntomas son ms evidentes 2o 3das despus de dejar de fumar, pero no suelen durar ms de 2o 3semanas. Entre los cambios o sntomas que podra experimentar, se incluyen los siguientes:  Cambios en el estado de  nimo.  Inquietud, ansiedad o irritacin.  Dificultad para concentrarse.  Mareos.  Deseo intenso de comer alimentos con azcar, adems de fumar.  Leve aumento de peso.  Estreimiento.  Nuseas.  Tos o dolor de Investment banker, operational.  Cambios en los efectos de los medicamentos en el cuerpo.  Depresin.  Dificultad para dormir (insomnio). Despus de 2o 3semanas de haber dejado de fumar, comenzar a notar resultados ms positivos, como los siguientes:  Mejor sentido del gusto y Armed forces logistics/support/administrative officer.  Menos tos y dolor de Investment banker, operational.  Frecuencia cardaca ms lenta.  Presin arterial ms baja.  Piel ms clara.  Mayor capacidad para Ambulance person.  Menos das de licencia por enfermedad. Dejar de fumar es muy difcil para la [place]. No se desanime si no lo logra la primera vez. Algunas personas necesitan intentarlo varias veces antes de lograr dejar de fumar definitivamente. Haga lo posible por respetar el plan para dejar de fumar y hable con su mdico si tiene alguna pregunta o inquietud.   Esta informacin no tiene Marine scientist el consejo del mdico. Asegrese de hacerle al mdico cualquier pregunta que tenga.   Document Released: 11/22/2005 Document Revised: 04/08/2015 Elsevier Interactive Patient Education 2016 [place] dejar de fumar (You Can Quit Smoking) Si usted est listo para dejar de fumar o est pensando en ello, felicitaciones! Ha elegido tener una vida ms sana y vivir ms tiempo. Hay muchas formas de dejar de fumar. Los chicles, parches, inhaladores o aerosol nasal con nicotina pueden ayudarlo con el deseo fsico. La hipnosis, los grupos de apoyo y algunos medicamentos tambin pueden ayudarlo. CONSEJOS PARA DEJAR EL HBITO Y NO VOLVER A FUMAR  Aprenda a predecir sus estados de nimo. Evite que un mal momento sea una excusa para fumar. Algunas situaciones podrn tentarlo.  Pdale a sus amigos y compaeros que no fumen a su alrededor.  Haga  de su hogar un lugar libre de humo.  Nunca diga "solo uno". Esto lleva a desear otro y otro. Recurdese a usted mismo su decisin de dejar de fumar.  En un papel, haga una lista de sus razones para no fumar. Lalo al menos el mismo nmero de veces al da en que fume un cigarrillo. Dgase a usted [employer]ra [employer], "No quiero fumar [place] no fumar "  Pdale a otra persona de su hogar o del trabajo que lo ayude con su plan para dejar de fumar.  Planifique algo para hacer enseguida despus de comer o cuando toma una taza de caf. Salga a caminar o haga algn ejercicio para animarse. Esto lo ayudar a Radiographer, therapeutic.  Haga ejercicios de relajacin para calmarse y Investment banker, operational. Recuerde, puede estar tenso y [employer] primeras semanas. Esto pasar.  Encuentre nuevas actividades para [employer] ocupadas Juegue con [place] lapicera, una moneda o una bandita elstica. Garabatee o dibuje en un papel.  Cepllese los dientes enseguida despus de comer. Esto lo ayudar a Tour manager deseo del tabaco luego de las comidas. Tambin puede probar con un enjuague bucal.  Pruebe con caramelos de goma, pastillas de menta, o caramelos dietticos para mantener algo en la boca. SI USTED FUMA Y QUIERE DEJAR EL HBITO:  Evite abastecerse de cigarrillos. Nunca compre un cartn. Espere a que un paquete se termine antes de Recruitment consultant.  Nunca lleve los cigarrillos con usted en el trabajo o en su casa.  Mantngalos lo ms lejos posible. Djeselos a Nurse, children's.  Nunca lleve los fsforos o el encendedor con usted.  Siempre pregntese: "Necesito este cigarrillo o slo es un reflejo?"  Haga una apuesta con alguien a que puede dejar de fumar. Ponga el dinero de los cigarrillos en una alcanca cada [place]. Si usted fuma, pierde el dinero. Si no fuma, al final de la semana, tendr ms dinero.  Siga intentando. Se tarda 21 das en cambiar un hbito!  Hable con su mdico acerca del uso  de medicamentos para dejar de fumar. Entre ellos se incluyen la goma de Higher education careers adviser, Merchandiser, retail o parches para la piel como reemplazo de la nicotina.   Esta informacin no tiene Marine scientist el consejo del mdico. Asegrese de hacerle al mdico cualquier pregunta que tenga.   Document Released: 12/25/2010 Document Revised: 02/14/2012 Elsevier Interactive Patient Education [employer].

## 2016-10-19 ENCOUNTER — Ambulatory Visit: Payer: Self-pay | Admitting: Cardiovascular Disease

## 2016-10-22 ENCOUNTER — Encounter: Payer: Self-pay | Admitting: *Deleted

## 2017-12-06 ENCOUNTER — Inpatient Hospital Stay (HOSPITAL_COMMUNITY)
Admission: EM | Admit: 2017-12-06 | Discharge: 2017-12-16 | DRG: 264 | Disposition: A | Discharge status: Home Health | Payer: Self-pay | Attending: Internal Medicine | Admitting: Internal Medicine

## 2017-12-06 ENCOUNTER — Emergency Department (HOSPITAL_COMMUNITY): Payer: Self-pay

## 2017-12-06 ENCOUNTER — Other Ambulatory Visit: Payer: Self-pay

## 2017-12-06 ENCOUNTER — Encounter (HOSPITAL_COMMUNITY): Payer: Self-pay | Admitting: Emergency Medicine

## 2017-12-06 DIAGNOSIS — Z79899 Other long term (current) drug therapy: Secondary | ICD-10-CM

## 2017-12-06 DIAGNOSIS — N179 Acute kidney failure, unspecified: Secondary | ICD-10-CM

## 2017-12-06 DIAGNOSIS — N201 Calculus of ureter: Secondary | ICD-10-CM

## 2017-12-06 DIAGNOSIS — Z9114 Patient's other noncompliance with medication regimen: Secondary | ICD-10-CM

## 2017-12-06 DIAGNOSIS — I5033 Acute on chronic diastolic (congestive) heart failure: Secondary | ICD-10-CM | POA: Diagnosis present

## 2017-12-06 DIAGNOSIS — I248 Other forms of acute ischemic heart disease: Secondary | ICD-10-CM | POA: Diagnosis present

## 2017-12-06 DIAGNOSIS — Z87891 Personal history of nicotine dependence: Secondary | ICD-10-CM

## 2017-12-06 DIAGNOSIS — E8770 Fluid overload, unspecified: Secondary | ICD-10-CM | POA: Diagnosis present

## 2017-12-06 DIAGNOSIS — I132 Hypertensive heart and chronic kidney disease with heart failure and with stage 5 chronic kidney disease, or end stage renal disease: Principal | ICD-10-CM | POA: Diagnosis present

## 2017-12-06 DIAGNOSIS — E559 Vitamin D deficiency, unspecified: Secondary | ICD-10-CM | POA: Diagnosis present

## 2017-12-06 DIAGNOSIS — D631 Anemia in chronic kidney disease: Secondary | ICD-10-CM | POA: Diagnosis present

## 2017-12-06 DIAGNOSIS — Z452 Encounter for adjustment and management of vascular access device: Secondary | ICD-10-CM

## 2017-12-06 DIAGNOSIS — N132 Hydronephrosis with renal and ureteral calculous obstruction: Secondary | ICD-10-CM | POA: Diagnosis present

## 2017-12-06 DIAGNOSIS — R339 Retention of urine, unspecified: Secondary | ICD-10-CM | POA: Diagnosis present

## 2017-12-06 DIAGNOSIS — K5900 Constipation, unspecified: Secondary | ICD-10-CM | POA: Diagnosis present

## 2017-12-06 DIAGNOSIS — Z8249 Family history of ischemic heart disease and other diseases of the circulatory system: Secondary | ICD-10-CM

## 2017-12-06 DIAGNOSIS — I10 Essential (primary) hypertension: Secondary | ICD-10-CM | POA: Diagnosis present

## 2017-12-06 DIAGNOSIS — E1121 Type 2 diabetes mellitus with diabetic nephropathy: Secondary | ICD-10-CM

## 2017-12-06 DIAGNOSIS — E1129 Type 2 diabetes mellitus with other diabetic kidney complication: Secondary | ICD-10-CM | POA: Diagnosis present

## 2017-12-06 DIAGNOSIS — E440 Moderate protein-calorie malnutrition: Secondary | ICD-10-CM

## 2017-12-06 DIAGNOSIS — E872 Acidosis: Secondary | ICD-10-CM | POA: Diagnosis present

## 2017-12-06 DIAGNOSIS — E1122 Type 2 diabetes mellitus with diabetic chronic kidney disease: Secondary | ICD-10-CM | POA: Diagnosis present

## 2017-12-06 DIAGNOSIS — N186 End stage renal disease: Secondary | ICD-10-CM | POA: Diagnosis present

## 2017-12-06 DIAGNOSIS — R1110 Vomiting, unspecified: Secondary | ICD-10-CM

## 2017-12-06 HISTORY — DX: Type 2 diabetes mellitus without complications: E11.9

## 2017-12-06 HISTORY — DX: Inflammatory liver disease, unspecified: K75.9

## 2017-12-06 HISTORY — DX: End stage renal disease: N18.6

## 2017-12-06 LAB — URINALYSIS, ROUTINE W REFLEX MICROSCOPIC
BILIRUBIN URINE: NEGATIVE
Glucose, UA: 50 mg/dL — AB
Ketones, ur: NEGATIVE mg/dL
LEUKOCYTES UA: NEGATIVE
NITRITE: NEGATIVE
PH: 5 (ref 5.0–8.0)
Protein, ur: >=300 mg/dL — AB
SPECIFIC GRAVITY, URINE: 1.011 (ref 1.005–1.030)

## 2017-12-06 LAB — CBC
HCT: 23.6 % — ABNORMAL LOW (ref 39.0–52.0)
HEMATOCRIT: 22.3 % — AB (ref 39.0–52.0)
HEMOGLOBIN: 7.9 g/dL — AB (ref 13.0–17.0)
Hemoglobin: 7.6 g/dL — ABNORMAL LOW (ref 13.0–17.0)
MCH: 29.5 pg (ref 26.0–34.0)
MCH: 29.8 pg (ref 26.0–34.0)
MCHC: 33.5 g/dL (ref 30.0–36.0)
MCHC: 34.1 g/dL (ref 30.0–36.0)
MCV: 88.1 fL (ref 78.0–100.0)
MCV: 87.5 fL (ref 78.0–100.0)
Platelets: 247 10*3/uL (ref 150–400)
Platelets: 252 10*3/uL (ref 150–400)
RBC: 2.68 MIL/uL — ABNORMAL LOW (ref 4.22–5.81)
RBC: 2.55 MIL/uL — AB (ref 4.22–5.81)
RDW: 14.2 % (ref 11.5–15.5)
RDW: 14.2 % (ref 11.5–15.5)
WBC: 7.8 10*3/uL (ref 4.0–10.5)
WBC: 8.7 10*3/uL (ref 4.0–10.5)

## 2017-12-06 LAB — COMPREHENSIVE METABOLIC PANEL
ALBUMIN: 3.4 g/dL — AB (ref 3.5–5.0)
ALK PHOS: 65 U/L (ref 38–126)
ALT: 10 U/L — ABNORMAL LOW (ref 17–63)
ANION GAP: 17 — AB (ref 5–15)
AST: 9 U/L — ABNORMAL LOW (ref 15–41)
BILIRUBIN TOTAL: 0.5 mg/dL (ref 0.3–1.2)
BUN: 136 mg/dL — ABNORMAL HIGH (ref 6–20)
CALCIUM: 6.1 mg/dL — AB (ref 8.9–10.3)
CO2: 12 mmol/L — ABNORMAL LOW (ref 22–32)
Chloride: 106 mmol/L (ref 101–111)
Creatinine, Ser: 15.16 mg/dL — ABNORMAL HIGH (ref 0.61–1.24)
GFR, EST AFRICAN AMERICAN: 4 mL/min — AB (ref >60)
GFR, EST NON AFRICAN AMERICAN: 3 mL/min — AB (ref >60)
GLUCOSE: 93 mg/dL (ref 65–99)
Potassium: 5 mmol/L (ref 3.5–5.1)
Sodium: 135 mmol/L (ref 135–145)
Total Protein: 6.8 g/dL (ref 6.5–8.1)

## 2017-12-06 LAB — CREATININE, SERUM
Creatinine, Ser: 15.61 mg/dL — ABNORMAL HIGH (ref 0.61–1.24)
GFR, EST AFRICAN AMERICAN: 3 mL/min — AB (ref >60)
GFR, EST NON AFRICAN AMERICAN: 3 mL/min — AB (ref >60)

## 2017-12-06 LAB — HEMOGLOBIN A1C
Hgb A1c MFr Bld: 4.6 % — ABNORMAL LOW (ref 4.8–5.6)
MEAN PLASMA GLUCOSE: 85.32 mg/dL

## 2017-12-06 LAB — BRAIN NATRIURETIC PEPTIDE: B Natriuretic Peptide: 2214.9 pg/mL — ABNORMAL HIGH (ref 0.0–100.0)

## 2017-12-06 LAB — LIPASE, BLOOD: Lipase: 40 U/L (ref 11–51)

## 2017-12-06 LAB — TROPONIN I
TROPONIN I: 0.07 ng/mL — AB (ref <0.03)
Troponin I: 0.05 ng/mL (ref <0.03)

## 2017-12-06 MED ORDER — ACETAMINOPHEN 325 MG PO TABS
650.0000 mg | ORAL_TABLET | Freq: Four times a day (QID) | ORAL | Status: DC | PRN
  Administered 2017-12-07 – 2017-12-08 (×3): 650 mg via ORAL
  Filled 2017-12-06 (×3): qty 2

## 2017-12-06 MED ORDER — ONDANSETRON HCL 4 MG/2ML IJ SOLN
4.0000 mg | Freq: Four times a day (QID) | INTRAMUSCULAR | Status: DC | PRN
  Administered 2017-12-07 – 2017-12-08 (×2): 4 mg via INTRAVENOUS
  Filled 2017-12-06 (×2): qty 2

## 2017-12-06 MED ORDER — SODIUM CHLORIDE 0.9 % IV SOLN
1.0000 g | Freq: Once | INTRAVENOUS | Status: AC
  Administered 2017-12-06: 1 g via INTRAVENOUS
  Filled 2017-12-06: qty 10

## 2017-12-06 MED ORDER — CALCIUM CARBONATE ANTACID 500 MG PO CHEW
4.0000 | CHEWABLE_TABLET | Freq: Three times a day (TID) | ORAL | Status: DC
  Administered 2017-12-07 – 2017-12-13 (×19): 800 mg via ORAL
  Filled 2017-12-06 (×17): qty 4

## 2017-12-06 MED ORDER — ONDANSETRON HCL 4 MG PO TABS: 4.0000 mg | ORAL_TABLET | Freq: Four times a day (QID) | ORAL | Status: DC | PRN

## 2017-12-06 MED ORDER — CARVEDILOL 12.5 MG PO TABS
12.5000 mg | ORAL_TABLET | Freq: Two times a day (BID) | ORAL | Status: DC
  Administered 2017-12-07 – 2017-12-16 (×16): 12.5 mg via ORAL
  Filled 2017-12-06 (×17): qty 1

## 2017-12-06 MED ORDER — SODIUM BICARBONATE 8.4 % IV SOLN
50.0000 meq | Freq: Once | INTRAVENOUS | Status: AC
  Administered 2017-12-06: 50 meq via INTRAVENOUS
  Filled 2017-12-06: qty 50

## 2017-12-06 MED ORDER — SODIUM CHLORIDE 0.9% FLUSH
3.0000 mL | Freq: Two times a day (BID) | INTRAVENOUS | Status: DC
  Administered 2017-12-06 – 2017-12-15 (×16): 3 mL via INTRAVENOUS

## 2017-12-06 MED ORDER — ONDANSETRON 4 MG PO TBDP
4.0000 mg | ORAL_TABLET | Freq: Once | ORAL | Status: AC | PRN
  Administered 2017-12-06: 4 mg via ORAL
  Filled 2017-12-06: qty 1

## 2017-12-06 MED ORDER — MORPHINE SULFATE (PF) 4 MG/ML IV SOLN
4.0000 mg | Freq: Once | INTRAVENOUS | Status: AC
  Administered 2017-12-06: 4 mg via INTRAVENOUS
  Filled 2017-12-06: qty 1

## 2017-12-06 MED ORDER — HYDRALAZINE HCL 25 MG PO TABS
25.0000 mg | ORAL_TABLET | Freq: Three times a day (TID) | ORAL | Status: DC
  Administered 2017-12-06 – 2017-12-16 (×20): 25 mg via ORAL
  Filled 2017-12-06 (×24): qty 1

## 2017-12-06 MED ORDER — ONDANSETRON HCL 4 MG/2ML IJ SOLN
4.0000 mg | Freq: Once | INTRAMUSCULAR | Status: AC
  Administered 2017-12-06: 4 mg via INTRAVENOUS
  Filled 2017-12-06: qty 2

## 2017-12-06 MED ORDER — ASPIRIN EC 81 MG PO TBEC
81.0000 mg | DELAYED_RELEASE_TABLET | Freq: Every day | ORAL | Status: DC
  Administered 2017-12-06 – 2017-12-16 (×10): 81 mg via ORAL
  Filled 2017-12-06 (×10): qty 1

## 2017-12-06 MED ORDER — ACETAMINOPHEN 650 MG RE SUPP
650.0000 mg | Freq: Four times a day (QID) | RECTAL | Status: DC | PRN
Start: 2017-12-06 — End: 2017-12-17

## 2017-12-06 MED ORDER — CALCIUM CARBONATE ANTACID 500 MG PO CHEW
4.0000 | CHEWABLE_TABLET | Freq: Three times a day (TID) | ORAL | Status: DC
  Administered 2017-12-06 – 2017-12-13 (×16): 800 mg via ORAL
  Filled 2017-12-06 (×17): qty 4

## 2017-12-06 MED ORDER — SODIUM CHLORIDE 0.9 % IV SOLN
250.0000 mL | INTRAVENOUS | Status: DC | PRN
  Administered 2017-12-08: 15:00:00 via INTRAVENOUS

## 2017-12-06 MED ORDER — HEPARIN SODIUM (PORCINE) 5000 UNIT/ML IJ SOLN
5000.0000 [IU] | Freq: Three times a day (TID) | INTRAMUSCULAR | Status: DC
  Administered 2017-12-06 – 2017-12-07 (×2): 5000 [IU] via SUBCUTANEOUS
  Filled 2017-12-06 (×2): qty 1

## 2017-12-06 MED ORDER — CALCIUM CARBONATE ANTACID 500 MG PO CHEW
1.0000 | CHEWABLE_TABLET | Freq: Once | ORAL | Status: AC
  Administered 2017-12-06: 200 mg via ORAL
  Filled 2017-12-06: qty 1

## 2017-12-06 MED ORDER — HYDRALAZINE HCL 20 MG/ML IJ SOLN
10.0000 mg | INTRAMUSCULAR | Status: DC | PRN
Start: 2017-12-06 — End: 2017-12-17
  Administered 2017-12-06 – 2017-12-09 (×3): 10 mg via INTRAVENOUS
  Filled 2017-12-06 (×3): qty 1

## 2017-12-06 MED ORDER — SODIUM CHLORIDE 0.9% FLUSH: 3.0000 mL | INTRAVENOUS | Status: DC | PRN

## 2017-12-06 NOTE — H&P (Addendum)
TRH H&P    Patient Demographics:    Fernando Jones, is a 57 y.o. male  MRN: [id number]  DOB - 08-31-1961  Admit Date - 12/06/2017  Referring MD/NP/PA: Arlean Hopping  Outpatient Primary MD for the patient is Patient, No Pcp Per  Patient coming from: Home  Chief Complaint  Patient presents with  . Abdominal Pain  . Nausea  . Emesis  . Diarrhea      HPI:    Fernando Jones  is a 57 y.o. Hispanic male, with history of diabetes mellitus, hypertension, CK D stage V, who came to hospital with abdominal pain with nausea and vomiting. CT showed mild left sided hydronephrosis and distal ureteral calculus, possible ascending colon wall thickening, bilateral pleural effusions.  Labs showed BUN 36, creatinine 15.16 Patient denies chest pain, Had diarrhea for 3 days then stopped. Denies dysuria, no urgency or frequency of urination. Denies fever. No previous history of stroke or seizures. Patient has been noncompliant with his medications hypertension, does not take any medication for diabetes mellitus. Blood pressure found to be significantly elevated to 212/106     Review of systems:      All other systems reviewed and are negative.   With Past History of the following :    Past Medical History:  Diagnosis Date  . Diabetes mellitus without complication ([place])   . Erectile dysfunction 2012  . Hyperlipidemia 2012  . Hypertension   . Tobacco abuse 2012   1/2 pack per day      Past Surgical History:  Procedure Laterality Date  . APPENDECTOMY        Social History:      Social History   Tobacco Use  . Smoking status: Former Smoker    Types: Cigarettes    Last attempt to quit: 12/06/2016    Years since quitting: 1.0  . Smokeless tobacco: Never Used  Substance Use Topics  . Alcohol use: No       Family History :     Family History  Problem Relation Age of Onset  . Congestive Heart  Failure Mother   . Diabetes Neg Hx      Home Medications:   Prior to Admission medications   Medication Sig Start Date End Date Taking? Authorizing Provider  acetaminophen (TYLENOL) 500 MG tablet Take 1,000 mg by mouth every 6 (six) hours as needed for moderate pain.   Yes [provider]  b complex vitamins tablet Take 1 tablet by mouth daily.   Yes [provider]  aspirin EC 81 MG EC tablet Take 1 tablet (81 mg total) by mouth daily. Patient not taking: Reported on 12/06/2017 10/17/16   Murlean Iba, MD  carvedilol (COREG) 12.5 MG tablet Take 1 tablet (12.5 mg total) by mouth 2 (two) times daily with a meal. Patient not taking: Reported on 12/06/2017 10/17/16 11/16/16  Murlean Iba, MD  furosemide (LASIX) 80 MG tablet Take 1 tablet (80 mg total) by mouth 2 (two) times daily. Patient not taking: Reported on 12/06/2017 10/17/16 11/16/16  Johnson, Clanford L, MD  hydrALAZINE (APRESOLINE) 25 MG tablet Take 1 tablet (25 mg total) by mouth every 8 (eight) hours. Patient not taking: Reported on 12/06/2017 10/17/16 11/16/16  Murlean Iba, MD     Allergies:    No Known Allergies   Physical Exam:   Vitals  Blood pressure (!) 212/106, pulse 85, temperature 98.1 F (36.7 C), resp. rate (!) 21, weight 59 kg (130 lb), SpO2 100 %.  1.  General: Appears in no acute distress  2. Psychiatric:  Intact judgement and  insight, awake alert, oriented x 3.  3. Neurologic: No focal neurological deficits, all cranial nerves intact.Strength 5/5 all 4 extremities, sensation intact all 4 extremities, plantars down going.  4. Eyes :  anicteric sclerae, moist conjunctivae with no lid lag. PERRLA.  5. ENMT:  Oropharynx clear with moist mucous membranes and good dentition  6. Neck:  supple, no cervical lymphadenopathy appriciated, No thyromegaly  7. Respiratory : Normal respiratory effort, good air movement bilaterally,clear to  auscultation bilaterally  8.  Cardiovascular : RRR, no gallops, rubs or murmurs, no leg edema  9. Gastrointestinal:  Positive bowel sounds, abdomen soft, non-tender to palpation,no hepatosplenomegaly, no rigidity or guarding       10. Skin:  No cyanosis, normal texture and turgor, no rash, lesions or ulcers  11.Musculoskeletal:  Good muscle tone,  joints appear normal , no effusions,  normal range of motion    Data Review:    CBC Recent Labs  Lab 12/06/17 1348  WBC 7.8  HGB 7.9*  HCT 23.6*  PLT 247  MCV 88.1  MCH 29.5  MCHC 33.5  RDW 14.2   ------------------------------------------------------------------------------------------------------------------  Chemistries  Recent Labs  Lab 12/06/17 1348  NA 135  K 5.0  CL 106  CO2 12*  GLUCOSE 93  BUN 136*  CREATININE 15.16*  CALCIUM 6.1*  AST 9*  ALT 10*  ALKPHOS 65  BILITOT 0.5   ------------------------------------------------------------------------------------------------------------------  ------------------------------------------------------------------------------------------------------------------ GFR: CrCl cannot be calculated (Unknown ideal weight.). Liver Function Tests: Recent Labs  Lab 12/06/17 1348  AST 9*  ALT 10*  ALKPHOS 65  BILITOT 0.5  PROT 6.8  ALBUMIN 3.4*   Recent Labs  Lab 12/06/17 1348  LIPASE 40   No results for input(s): AMMONIA in the last 168 hours. Coagulation Profile: No results for input(s): INR, PROTIME in the last 168 hours. Cardiac Enzymes: Recent Labs  Lab 12/06/17 1348  TROPONINI 0.07*    --------------------------------------------------------------------------------------------------------------- Urine analysis:    Component Value Date/Time   COLORURINE YELLOW 12/06/2017 1339   APPEARANCEUR CLEAR 12/06/2017 1339   LABSPEC 1.011 12/06/2017 1339   PHURINE 5.0 12/06/2017 1339   GLUCOSEU 50 (A) 12/06/2017 1339   HGBUR MODERATE (A) 12/06/2017 1339   BILIRUBINUR NEGATIVE  12/06/2017 1339   KETONESUR NEGATIVE 12/06/2017 1339   PROTEINUR >=300 (A) 12/06/2017 1339   NITRITE NEGATIVE 12/06/2017 1339   LEUKOCYTESUR NEGATIVE 12/06/2017 1339      Imaging Results:    Ct Abdomen Pelvis Wo Contrast  Result Date: 12/06/2017 CLINICAL DATA:  Left lower quadrant pain. Pertinent pressure for 7 days. Nausea vomiting and fatigue for 7 days. Diverticulitis suspected. EXAM: CT ABDOMEN AND PELVIS WITHOUT CONTRAST TECHNIQUE: Multidetector CT imaging of the abdomen and pelvis was performed following the standard protocol without IV contrast. COMPARISON:  10/13/2016 renal ultrasound.  No prior CT. FINDINGS: Lower chest: Clear lung bases. Small right larger than left pleural effusions. Borderline cardiomegaly. Hepatobiliary: Normal liver. Normal gallbladder, without biliary ductal dilatation. Pancreas:  Normal, without mass or ductal dilatation. Spleen: Normal in size, without focal abnormality. Adrenals/Urinary Tract: Normal adrenal glands. Bilateral renal cortical thinning. Bilateral renal vascular calcifications. Possible concurrent bilateral collecting system calculi. Mild left-sided hydroureteronephrosis, followed to the level of a distal left ureteric 4 mm stone on image 66/series 2. No bladder calculi. Stomach/Bowel: Proximal gastric underdistention. Sigmoid colon diverticulosis. Ascending colonic underdistention. Apparent wall thickening is at least partially felt to be secondary. Example image 41/series 2. Normal terminal ileum. Normal small bowel. Vascular/Lymphatic: Aortic and branch vessel atherosclerosis. No abdominopelvic adenopathy. Reproductive: Mild prostatomegaly. Other: Trace cul-de-sac fluid.  Anasarca. Musculoskeletal: Degenerative disc disease at the lumbosacral junction. IMPRESSION: 1. Mild left-sided urinary tract obstruction secondary to a distal left ureteric calculus. 2. Ascending colonic underdistention. Apparent wall thickening is at least partially secondary. Cannot  exclude concurrent colitis. 3. Bilateral pleural effusions and small volume pelvic fluid. Question fluid overload. 4.  Aortic Atherosclerosis (ICD10-I70.0). 5. Limited exam secondary to lack of oral or IV contrast. Electronically Signed   By: Abigail Miyamoto M.D.   On: 12/06/2017 16:12   Dg Chest 2 View  Result Date: 12/06/2017 CLINICAL DATA:  Left upper quadrant pain and pressure for 7 days EXAM: CHEST  2 VIEW COMPARISON:  October 12, 2016 FINDINGS: The heart size and mediastinal contours are within normal limits. Mild central pulmonary vascular congestion is identified. No frank pulmonary edema. Minimal left pleural effusion noted. No focal pneumonia is identified. The visualized skeletal structures are stable. IMPRESSION: Mild central pulmonary vascular congestion without frank pulmonary edema. Minimal left pleural effusion. Electronically Signed   By: Abelardo Diesel M.D.   On: 12/06/2017 16:19    My personal review of EKG: Rhythm NSR, nonspecific ST changes.   Assessment & Plan:    Active Problems:   ESRD (end stage renal disease) (HCC)   Hypertension    Diabetes mellitus    Elevated troponin  1. ESRD- new-onset, patient's previous creatinine from 2017 was around 5, today creatinine is 15.17. He has not been compliant  with his medications for hypertension. BUN is 136. Patient did have intermittent episodes of confusion. Will be transferred to [facility] for hemodialysis. Nephrology has been consulted. 2. Hypertension- blood pressure is elevated, will restart home medications including Coreg, hydralazine. 3. Left ureteral stone/hydronephrosis- called and discussed with urology, they will see the patient in the ED, and will be involved in care. Urology will discuss with nephrology regarding cystoscopy and stent placement. 4. Elevated troponin- troponin mildly elevated 0.07, BNP 2214.9. Patient will require hemodialysis. EKG shows nonspecific ST changes. This is likely from demand ischemia.  We'll cycle troponin every 6 hours 3. 5. Diabetes mellitus- last hemoglobin A1c from 2017 was 5.3. Will recheck hemoglobin A1c. Blood glucose is stable at 93. 6. ? Colitis- CT shows thickening of the ascending colon, but patient complains of pain in the left upper quadrant. Will monitor. If patient's symptoms do not improve after dialysis. Consider starting antibiotics 7. Hypocalcemia- calcium 6.1, with albumin of 3.4. Calcium gluconate 1 g given in the ED. Follow calcium levels in a.m.   DVT Prophylaxis-   heparin  AM Labs Ordered, also please review Full Orders  Family Communication: Admission, patients condition and plan of care including tests being ordered have been discussed with the patient  who indicate understanding and agree with the plan and Code Status.  Code Status:  Full code  Admission status: Inpatient  Time spent in minutes : 60 min   Fernando Jones M.D  on 12/06/2017 at 5:27 PM  Between 7am to 7pm - Pager - [phone]. After 7pm go to [email or web address] - password [facility]  [facility] - Office  [phone]

## 2017-12-06 NOTE — ED Notes (Signed)
[facility] called for transport 

## 2017-12-06 NOTE — Consult Note (Signed)
Urology Consult Note   Requesting Attending Physician:  Cristal Ford, DO Service Providing Consult: Urology  Consulting Attending: Jeffie Pollock   Reason for Consult:  Elevated Cr, Abdm Pain, Kidney stone  HPI: Fernando Jones is seen in consultation for reasons noted above at the request of Cristal Ford, DO for evaluation of a left obstructing kidney stone.  This is a 57 y.o. male with hx of DM, poorly controlled HTN, and CKD (V), who presents w/ abdm pain, nausea and vomiting. Labs notable for Cr of 15, last Cr was 5 in 2017.   CT showed Left 23m distal ureteral calculi with mild hydronephrosis. No right sided kidney stone. Relatively normal parenchyma in both kidneys. Also Ct noted ascending colon wall thickening.   Has had nausea and vomiting for the past week. Pain is over his entire abdm. No groin pain. No dysuria, urgency, frequency or hematuria. Continues to void and make urine over the past week   WBC - 7 , Cr - 15.16, BUN - 136 U/A - negative LE, negative nitrites,rare bacteria, 0-5 WBC.   Past Medical History: Past Medical History:  Diagnosis Date  . Diabetes mellitus without complication ([place]   . Erectile dysfunction 2012  . Hyperlipidemia 2012  . Hypertension   . Tobacco abuse 2012   1/2 pack per day    Past Surgical History:  Past Surgical History:  Procedure Laterality Date  . APPENDECTOMY      Medication: Current Facility-Administered Medications  Medication Dose Route Frequency Provider Last Rate Last Dose  . hydrALAZINE (APRESOLINE) injection 10 mg  10 mg Intravenous Q4H PRN LOswald Hillock MD   10 mg at 12/06/17 1756   Current Outpatient Medications  Medication Sig Dispense Refill  . acetaminophen (TYLENOL) 500 MG tablet Take 1,000 mg by mouth every 6 (six) hours as needed for moderate pain.    .Marland Kitchenb complex vitamins tablet Take 1 tablet by mouth daily.    .Marland Kitchenaspirin EC 81 MG EC tablet Take 1 tablet (81 mg total) by mouth daily. (Patient not taking:  Reported on 12/06/2017)    . carvedilol (COREG) 12.5 MG tablet Take 1 tablet (12.5 mg total) by mouth 2 (two) times daily with a meal. (Patient not taking: Reported on 12/06/2017) 60 tablet 0  . furosemide (LASIX) 80 MG tablet Take 1 tablet (80 mg total) by mouth 2 (two) times daily. (Patient not taking: Reported on 12/06/2017) 60 tablet 0  . hydrALAZINE (APRESOLINE) 25 MG tablet Take 1 tablet (25 mg total) by mouth every 8 (eight) hours. (Patient not taking: Reported on 12/06/2017) 90 tablet 0    Allergies: No Known Allergies  Social History: Social History   Tobacco Use  . Smoking status: Former Smoker    Types: Cigarettes    Last attempt to quit: 12/06/2016    Years since quitting: 1.0  . Smokeless tobacco: Never Used  Substance Use Topics  . Alcohol use: No  . Drug use: No    Family History Family History  Problem Relation Age of Onset  . Congestive Heart Failure Mother   . Diabetes Neg Hx     Review of Systems 10 systems were reviewed and are negative except as noted specifically in the HPI.  Objective   Vital signs in last 24 hours: BP (!) 216/103   Pulse 85   Temp 98.1 F (36.7 C)   Resp (!) 21   Wt 59 kg (130 lb)   SpO2 100%   BMI 21.63 kg/m  Physical Exam General: NAD, A&O, resting, appropriate HEENT: [place]/AT, EOMI, MMM Pulmonary: Normal work of breathing Cardiovascular: HDS, adequate peripheral perfusion Abdomen: Soft, tenderness over abdm . GU: left CVA tenderness Extremities: warm and well perfused Neuro: Appropriate, no focal neurological deficits  Most Recent Labs: Lab Results  Component Value Date   WBC 7.8 12/06/2017   HGB 7.9 (L) 12/06/2017   HCT 23.6 (L) 12/06/2017   PLT 247 12/06/2017    Lab Results  Component Value Date   NA 135 12/06/2017   K 5.0 12/06/2017   CL 106 12/06/2017   CO2 12 (L) 12/06/2017   BUN 136 (H) 12/06/2017   CREATININE 15.16 (H) 12/06/2017   CALCIUM 6.1 (LL) 12/06/2017   PHOS 4.1 10/17/2016    Lab Results   Component Value Date   INR 1.0 05/12/2009   APTT 35 05/12/2009     IMAGING: Ct Abdomen Pelvis Wo Contrast  Result Date: 12/06/2017 CLINICAL DATA:  Left lower quadrant pain. Pertinent pressure for 7 days. Nausea vomiting and fatigue for 7 days. Diverticulitis suspected. EXAM: CT ABDOMEN AND PELVIS WITHOUT CONTRAST TECHNIQUE: Multidetector CT imaging of the abdomen and pelvis was performed following the standard protocol without IV contrast. COMPARISON:  10/13/2016 renal ultrasound.  No prior CT. FINDINGS: Lower chest: Clear lung bases. Small right larger than left pleural effusions. Borderline cardiomegaly. Hepatobiliary: Normal liver. Normal gallbladder, without biliary ductal dilatation. Pancreas: Normal, without mass or ductal dilatation. Spleen: Normal in size, without focal abnormality. Adrenals/Urinary Tract: Normal adrenal glands. Bilateral renal cortical thinning. Bilateral renal vascular calcifications. Possible concurrent bilateral collecting system calculi. Mild left-sided hydroureteronephrosis, followed to the level of a distal left ureteric 4 mm stone on image 66/series 2. No bladder calculi. Stomach/Bowel: Proximal gastric underdistention. Sigmoid colon diverticulosis. Ascending colonic underdistention. Apparent wall thickening is at least partially felt to be secondary. Example image 41/series 2. Normal terminal ileum. Normal small bowel. Vascular/Lymphatic: Aortic and branch vessel atherosclerosis. No abdominopelvic adenopathy. Reproductive: Mild prostatomegaly. Other: Trace cul-de-sac fluid.  Anasarca. Musculoskeletal: Degenerative disc disease at the lumbosacral junction. IMPRESSION: 1. Mild left-sided urinary tract obstruction secondary to a distal left ureteric calculus. 2. Ascending colonic underdistention. Apparent wall thickening is at least partially secondary. Cannot exclude concurrent colitis. 3. Bilateral pleural effusions and small volume pelvic fluid. Question fluid overload. 4.   Aortic Atherosclerosis (ICD10-I70.0). 5. Limited exam secondary to lack of oral or IV contrast. Electronically Signed   By: Abigail Miyamoto M.D.   On: 12/06/2017 16:12   Dg Chest 2 View  Result Date: 12/06/2017 CLINICAL DATA:  Left upper quadrant pain and pressure for 7 days EXAM: CHEST  2 VIEW COMPARISON:  October 12, 2016 FINDINGS: The heart size and mediastinal contours are within normal limits. Mild central pulmonary vascular congestion is identified. No frank pulmonary edema. Minimal left pleural effusion noted. No focal pneumonia is identified. The visualized skeletal structures are stable. IMPRESSION: Mild central pulmonary vascular congestion without frank pulmonary edema. Minimal left pleural effusion. Electronically Signed   By: Abelardo Diesel M.D.   On: 12/06/2017 16:19    ------  Assessment:  57 y.o. male with poorly controlled DM and HTN, known CKD (V), with new onset nausea and vomiting. Cr 15 now, newly in ESRD.   Unlikely that stone is completely responsible for elevation in Cr. Absolute decrease in GFR when compared to 2017 is relatively small. Sequela of stone or GI process, such as vomiting and subsequent hypovolemia, may be playing a larger roll in elevated of Cr. Unclear  what the patients true baseline is given that he has not had a Cr since 2017  At this time, no fever, WBC 7, U/A benign. Pain controlled. Nephrology recommends initiation of dialysis and does not believe that relief of obstruction would afford much benefit at this time. We agree.   No need for emergent stenting. Agree with nephrology recommendations. Recommend MET and starting flomax. We will continue to follow.   Recommendations: - Agree with transfer to [facility] for initiation of dialisys  - NPO at 12am in case stenting is required tomorrow  - Please contact urology is patient begins to shows signs of sepsis ( fever or rigors)  - Recommend starting Flomax 0.83m for medical expulsive therapy  - Urology will  continue to follow    Thank you for this consult. Please contact the urology consult pager with any further questions/concerns.  Add: 12/07/17  He reports reduced but persistent pain and continues to have nausea.  He remains tender in the LLQ this morning.     He would benefit from ureteroscopic stone extraction and stenting from a pain relief standpoint.  We can schedule that when he is cleared for anesthesia by Nephrology, [facility]

## 2017-12-06 NOTE — ED Provider Notes (Signed)
[facility] DEPT Provider Note   CSN: [id number] Arrival date & time: 12/06/17  1246     History   Chief Complaint Chief Complaint  Patient presents with  . Abdominal Pain  . Nausea  . Emesis  . Diarrhea    HPI Fernando Jones is a 57 y.o. male.  HPI   Fernando Jones is a 57 y.o. male, with a history of DM, HTN, presenting to the ED with abdominal pain for the past week. LLQ, burning/pressure, 9/10, nonradiating.  Has not experienced this pain before. Accompanied by N/V/D and shortness of breath, but states "my belly hurts so much it makes me feel short of breath." Last BM was yesterday morning and watery.  States he has not noticed any peripheral edema and states he usually does not have any at baseline. States he takes no home medications, despite those listed on his home medication list. Denies CP, gross hematuria, fever/chills, hematemesis, hematochezia/melena, or any other complaints.    Past Medical History:  Diagnosis Date  . Diabetes mellitus without complication ([place])   . Erectile dysfunction 2012  . Hyperlipidemia 2012  . Hypertension   . Tobacco abuse 2012   1/2 pack per day    Patient Active Problem List   Diagnosis Date Noted  . ESRD (end stage renal disease) ([place]) 12/06/2017  . Severe Vitamin D deficiency 10/14/2016  . Diastolic dysfunction with acute on chronic heart failure ([place]) 10/13/2016  . Acute respiratory failure with hypoxia ([place]) 10/12/2016  . Anasarca 10/12/2016  . Acute renal failure (ARF) ([place]) 10/12/2016  . Hypertensive urgency 10/12/2016  . Acute respiratory failure ([place]) 10/12/2016  . ERECTILE DYSFUNCTION, NON-ORGANIC 06/23/2009  . DM (diabetes mellitus), type 2 with renal complications ([place]) 62/95/2841  . HYPERLIPIDEMIA 05/20/2009  . TOBACCO ABUSE 05/20/2009  . Essential hypertension 05/20/2009  . CONSTIPATION 05/20/2009    Past Surgical History:  Procedure Laterality Date  .  APPENDECTOMY         Home Medications    Prior to Admission medications   Medication Sig Start Date End Date Taking? Authorizing Provider  acetaminophen (TYLENOL) 500 MG tablet Take 1,000 mg by mouth every 6 (six) hours as needed for moderate pain.   Yes [provider]  b complex vitamins tablet Take 1 tablet by mouth daily.   Yes [provider]  aspirin EC 81 MG EC tablet Take 1 tablet (81 mg total) by mouth daily. Patient not taking: Reported on 12/06/2017 10/17/16   Murlean Iba, MD  carvedilol (COREG) 12.5 MG tablet Take 1 tablet (12.5 mg total) by mouth 2 (two) times daily with a meal. Patient not taking: Reported on 12/06/2017 10/17/16 11/16/16  Murlean Iba, MD  furosemide (LASIX) 80 MG tablet Take 1 tablet (80 mg total) by mouth 2 (two) times daily. Patient not taking: Reported on 12/06/2017 10/17/16 11/16/16  Murlean Iba, MD  hydrALAZINE (APRESOLINE) 25 MG tablet Take 1 tablet (25 mg total) by mouth every 8 (eight) hours. Patient not taking: Reported on 12/06/2017 10/17/16 11/16/16  Murlean Iba, MD    Family History Family History  Problem Relation Age of Onset  . Congestive Heart Failure Mother   . Diabetes Neg Hx     Social History Social History   Tobacco Use  . Smoking status: Former Smoker    Types: Cigarettes    Last attempt to quit: 12/06/2016    Years since quitting: 1.0  . Smokeless tobacco: Never Used  Substance Use Topics  . Alcohol use: No  . Drug use: No     Allergies   Patient has no known allergies.   Review of Systems Review of Systems  Constitutional: Positive for activity change, appetite change and fatigue. Negative for chills, diaphoresis and fever.  Respiratory: Positive for shortness of breath. Negative for cough.   Cardiovascular: Negative for chest pain.  Gastrointestinal: Positive for abdominal pain, diarrhea, nausea and vomiting.  Genitourinary: Negative for difficulty urinating, dysuria  and hematuria.  All other systems reviewed and are negative.    Physical Exam Updated Vital Signs BP (!) 203/98 (BP Location: Left Arm)   Pulse 97   Temp 98.1 F (36.7 C)   Resp 20   Wt 59 kg (130 lb)   SpO2 100%   BMI 21.63 kg/m   Physical Exam  Constitutional: He appears well-developed and well-nourished. No distress.  HENT:  Head: Normocephalic and atraumatic.  Eyes: Conjunctivae are normal.  Neck: Neck supple.  Cardiovascular: Normal rate, regular rhythm, normal heart sounds and intact distal pulses.  Pulmonary/Chest: Effort normal and breath sounds normal. No respiratory distress.  Abdominal: Soft. Bowel sounds are normal. There is tenderness in the left lower quadrant. There is guarding.    Musculoskeletal: He exhibits edema. He exhibits no tenderness.  Bilateral LE pitting edema to the feet through the malleoli.   Lymphadenopathy:    He has no cervical adenopathy.  Neurological: He is alert.  Skin: Skin is warm and dry. He is not diaphoretic.  Psychiatric: He has a normal mood and affect. His behavior is normal.  Nursing note and vitals reviewed.    ED Treatments / Results  Labs (all labs ordered are listed, but only abnormal results are displayed) Labs Reviewed  COMPREHENSIVE METABOLIC PANEL - Abnormal; Notable for the following components:      Result Value   CO2 12 (*)    BUN 136 (*)    Creatinine, Ser 15.16 (*)    Calcium 6.1 (*)    Albumin 3.4 (*)    AST 9 (*)    ALT 10 (*)    GFR calc non Af Amer 3 (*)    GFR calc Af Amer 4 (*)    Anion gap 17 (*)    All other components within normal limits  CBC - Abnormal; Notable for the following components:   RBC 2.68 (*)    Hemoglobin 7.9 (*)    HCT 23.6 (*)    All other components within normal limits  URINALYSIS, ROUTINE W REFLEX MICROSCOPIC - Abnormal; Notable for the following components:   Glucose, UA 50 (*)    Hgb urine dipstick MODERATE (*)    Protein, ur >=300 (*)    Bacteria, UA RARE (*)     Squamous Epithelial / LPF 0-5 (*)    All other components within normal limits  TROPONIN I - Abnormal; Notable for the following components:   Troponin I 0.07 (*)    All other components within normal limits  BRAIN NATRIURETIC PEPTIDE - Abnormal; Notable for the following components:   B Natriuretic Peptide 2,214.9 (*)    All other components within normal limits  LIPASE, BLOOD   BUN  Date Value Ref Range Status  12/06/2017 136 (H) 6 - 20 mg/dL Final    Comment:    RESULTS CONFIRMED BY MANUAL DILUTION  10/17/2016 80 (H) 6 - 20 mg/dL Final  10/16/2016 80 (H) 6 - 20 mg/dL Final  10/15/2016 68 (H) 6 -  20 mg/dL Final   Creatinine, Ser  Date Value Ref Range Status  12/06/2017 15.16 (H) 0.61 - 1.24 mg/dL Final  10/17/2016 5.21 (H) 0.61 - 1.24 mg/dL Final  10/16/2016 5.38 (H) 0.61 - 1.24 mg/dL Final  10/15/2016 5.20 (H) 0.61 - 1.24 mg/dL Final      EKG  EKG Interpretation  Date/Time:  Tuesday December 06 2017 13:04:47 EST Ventricular Rate:  90 PR Interval:    QRS Duration: 111 QT Interval:  405 QTC Calculation: 496 R Axis:   70 Text Interpretation:  Sinus rhythm Anterior infarct, old Minimal ST depression since last tracing no significant change Confirmed by Daleen Bo [phone]) on 12/06/2017 5:34:12 PM       Radiology Ct Abdomen Pelvis Wo Contrast  Result Date: 12/06/2017 CLINICAL DATA:  Left lower quadrant pain. Pertinent pressure for 7 days. Nausea vomiting and fatigue for 7 days. Diverticulitis suspected. EXAM: CT ABDOMEN AND PELVIS WITHOUT CONTRAST TECHNIQUE: Multidetector CT imaging of the abdomen and pelvis was performed following the standard protocol without IV contrast. COMPARISON:  10/13/2016 renal ultrasound.  No prior CT. FINDINGS: Lower chest: Clear lung bases. Small right larger than left pleural effusions. Borderline cardiomegaly. Hepatobiliary: Normal liver. Normal gallbladder, without biliary ductal dilatation. Pancreas: Normal, without mass or ductal  dilatation. Spleen: Normal in size, without focal abnormality. Adrenals/Urinary Tract: Normal adrenal glands. Bilateral renal cortical thinning. Bilateral renal vascular calcifications. Possible concurrent bilateral collecting system calculi. Mild left-sided hydroureteronephrosis, followed to the level of a distal left ureteric 4 mm stone on image 66/series 2. No bladder calculi. Stomach/Bowel: Proximal gastric underdistention. Sigmoid colon diverticulosis. Ascending colonic underdistention. Apparent wall thickening is at least partially felt to be secondary. Example image 41/series 2. Normal terminal ileum. Normal small bowel. Vascular/Lymphatic: Aortic and branch vessel atherosclerosis. No abdominopelvic adenopathy. Reproductive: Mild prostatomegaly. Other: Trace cul-de-sac fluid.  Anasarca. Musculoskeletal: Degenerative disc disease at the lumbosacral junction. IMPRESSION: 1. Mild left-sided urinary tract obstruction secondary to a distal left ureteric calculus. 2. Ascending colonic underdistention. Apparent wall thickening is at least partially secondary. Cannot exclude concurrent colitis. 3. Bilateral pleural effusions and small volume pelvic fluid. Question fluid overload. 4.  Aortic Atherosclerosis (ICD10-I70.0). 5. Limited exam secondary to lack of oral or IV contrast. Electronically Signed   By: Abigail Miyamoto M.D.   On: 12/06/2017 16:12   Dg Chest 2 View  Result Date: 12/06/2017 CLINICAL DATA:  Left upper quadrant pain and pressure for 7 days EXAM: CHEST  2 VIEW COMPARISON:  October 12, 2016 FINDINGS: The heart size and mediastinal contours are within normal limits. Mild central pulmonary vascular congestion is identified. No frank pulmonary edema. Minimal left pleural effusion noted. No focal pneumonia is identified. The visualized skeletal structures are stable. IMPRESSION: Mild central pulmonary vascular congestion without frank pulmonary edema. Minimal left pleural effusion. Electronically Signed    By: Abelardo Diesel M.D.   On: 12/06/2017 16:19    Procedures .Critical Care Performed by: Lorayne Bender, PA-C Authorized by: Lorayne Bender, PA-C   Critical care provider statement:    Critical care time (minutes):  35   Critical care time was exclusive of:  Separately billable procedures and treating other patients   Critical care was necessary to treat or prevent imminent or life-threatening deterioration of the following conditions:  Renal failure   Critical care was time spent personally by me on the following activities:  Evaluation of patient's response to treatment, examination of patient, obtaining history from patient or surrogate, discussions with consultants, development  of treatment plan with patient or surrogate, ordering and performing treatments and interventions, ordering and review of laboratory studies, ordering and review of radiographic studies, pulse oximetry and re-evaluation of patient's condition   I assumed direction of critical care for this patient from another provider in my specialty: no     (including critical care time)  Medications Ordered in ED Medications  hydrALAZINE (APRESOLINE) injection 10 mg (not administered)  ondansetron (ZOFRAN-ODT) disintegrating tablet 4 mg (4 mg Oral Given 12/06/17 1322)  morphine 4 MG/ML injection 4 mg (4 mg Intravenous Given 12/06/17 1632)  calcium gluconate 1 g in sodium chloride 0.9 % 100 mL IVPB (0 g Intravenous Stopped 12/06/17 1748)     Initial Impression / Assessment and Plan / ED Course  I have reviewed the triage vital signs and the nursing notes.  Pertinent labs & imaging results that were available during my care of the patient were reviewed by me and considered in my medical decision making (see chart for details).  Clinical Course as of Dec 06 1754  Tue Dec 06, 2017  1624 Spoke with Dr. Marval Regal, nephrologist. Agrees with plan for transfer to [facility] for admission and dialysis. Requests patient receive 1g of  calcium gluconate.  [SJ]  1702 Spoke with Dr. Darrick Meigs, hospitalist, who agrees to admit the patient to [facility].  [SJ]  1715 Patient states his pain has improved.  [SJ]    Clinical Course User Index [SJ] Gisel Vipond C, PA-C    Patient presents with abdominal pain.  4 mm obstructing distal left ureteral stone noted on CT.  Suspect this is the source of the patient's pain.  Also found to have acute renal failure.  Patient with evidence of fluid overload. Patient to be transferred to [facility] for admission and dialysis. Patient was admitted to [facility] on 10/12/17 for respiratory failure and fluid overload in the setting of kidney failure (creatinine around 4.2). He was advised to follow up with nephrology at discharge, but failed to do so.  Findings and plan of care discussed with Zenovia Jarred, MD.   Vitals:   12/06/17 1255 12/06/17 1307 12/06/17 1315 12/06/17 1540  BP: (!) 216/97 (!) 203/98  (!) 212/106  Pulse: 97   85  Resp: 20   (!) 21  Temp: 98.1 F (36.7 C)     SpO2: 100%   100%  Weight:   59 kg (130 lb)       Final Clinical Impressions(s) / ED Diagnoses   Final diagnoses:  Acute renal failure, unspecified acute renal failure type [facility])  Left ureteral stone    ED Discharge Orders    None       Layla Maw 12/06/17 1757    Macarthur Critchley, MD 12/07/17 0006

## 2017-12-06 NOTE — ED Notes (Signed)
Patient transported to CT 

## 2017-12-06 NOTE — Progress Notes (Signed)
RN paged admissions MD to make aware of patient's arrival, awaiting response. P.J. Linus Mako, RN

## 2017-12-06 NOTE — Progress Notes (Signed)
CRITICAL VALUE ALERT  Critical Value:  Troponin 0.05  Date & Time Notied:  12/06/2017 2220  Provider Notified: Lamar Blinks  Orders Received/Actions taken: Awaiting response

## 2017-12-06 NOTE — Progress Notes (Signed)
Patient complains of inability to urinate and lower abdominal pain.  NT bladder scanned patient which showed 450 ml urine in bladder.  RN paged Lamar Blinks, NP to make her aware and inquire if patient can have I&O cath, awaiting response from NP.  P.J. Linus Mako, RN

## 2017-12-06 NOTE — Consult Note (Signed)
[facility] Consultation Note Requesting Physician:  [facility] EDP Primary Nephrologist: Coladonato (seen 2017 in hosp setting, did not F/U) Reason for Consult:  Renal failure  HPI: The patient is a 57 y.o. year-old Hispanic man with PMH DM, HTN, ED, medical noncompliance. 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 Marval Regal, never followed up. Presented to [facility] ED today with left sided abdominal pain. Labs notable for creatinine 15,  K 5, BUN 136, CO2 12, Ca 6.1, Hb 7.9. CT mild left sided hydro 2/2 distal uretal calculus, possible ascending colon wall thickening, bilateral effusions. Pt to be transferred to [facility] for new ESRD for initiation of dialysis.    Says he has felt bad with SOB and weakness for about 2 months, problems sleeping for a month, vomiting for about 2 weeks. Wants to eat but can't keep food down. Has been taking no meds at all. Says he was "scared" so didn't seek medical attention earlier.  Creatinine, Ser  Date/Time Value Ref Range Status  12/06/2017 01:48 PM 15.16 (H) 0.61 - 1.24 mg/dL Final  10/17/2016 04:28 AM 5.21 (H) 0.61 - 1.24 mg/dL Final  10/16/2016 05:00 AM 5.38 (H) 0.61 - 1.24 mg/dL Final  10/15/2016 03:43 AM 5.20 (H) 0.61 - 1.24 mg/dL Final  10/14/2016 04:38 AM 4.79 (H) 0.61 - 1.24 mg/dL Final  10/13/2016 06:03 AM 4.25 (H) 0.61 - 1.24 mg/dL Final  10/12/2016 11:30 PM 4.23 (H) 0.61 - 1.24 mg/dL Final  10/12/2016 08:11 PM 4.17 (H) 0.61 - 1.24 mg/dL Final  07/03/2011 06:15 PM 0.89 0.50 - 1.35 mg/dL Final  06/23/2009 09:55 PM 1.15 0.40 - 1.50 mg/dL Final  05/13/2009 06:15 AM 0.86 0.4 - 1.5 mg/dL Final  05/12/2009 06:00 AM 0.89 0.4 - 1.5 mg/dL Final  05/10/2009 05:45 AM 0.71 0.4 - 1.5 mg/dL Final  05/09/2009 05:30 AM 0.89 0.4 - 1.5 mg/dL Final    Past Medical History:  Diagnosis Date  . Diabetes mellitus without complication ([place])   . Erectile dysfunction 2012  . Hyperlipidemia 2012  . Hypertension   . Tobacco abuse 2012   1/2 pack per day    Past Surgical History:   Procedure Laterality Date  . APPENDECTOMY      Family History  Problem Relation Age of Onset  . Congestive Heart Failure Mother   . Diabetes Neg Hx    Social History:  reports that he quit smoking about a year ago. His smoking use included cigarettes. he has never used smokeless tobacco. He reports that he does not drink alcohol or use drugs. Lives with a friend. No children. Family is all in [place].  Allergies: No Known Allergies  Prior to Admission medications   Medication Sig Start Date End Date Taking? Authorizing Provider  acetaminophen (TYLENOL) 500 MG tablet Take 1,000 mg by mouth every 6 (six) hours as needed for moderate pain.   Yes [provider]  b complex vitamins tablet Take 1 tablet by mouth daily.   Yes [provider]  aspirin EC 81 MG EC tablet Take 1 tablet (81 mg total) by mouth daily. Patient not taking: Reported on 12/06/2017 10/17/16   Murlean Iba, MD  carvedilol (COREG) 12.5 MG tablet Take 1 tablet (12.5 mg total) by mouth 2 (two) times daily with a meal. Patient not taking: Reported on 12/06/2017 10/17/16 11/16/16  Murlean Iba, MD  furosemide (LASIX) 80 MG tablet Take 1 tablet (80 mg total) by mouth 2 (two) times daily. Patient not taking: Reported on  12/06/2017 10/17/16 11/16/16  Murlean Iba, MD  hydrALAZINE (APRESOLINE) 25 MG tablet Take 1 tablet (25 mg total) by mouth every 8 (eight) hours. Patient not taking: Reported on 12/06/2017 10/17/16 11/16/16  Murlean Iba, MD    Inpatient medications: pending   Review of Systems See HPI     Physical Exam:  Blood pressure (!) 212/106, pulse 85, temperature 98.1 F (36.7 C), resp. rate (!) 21, weight 59 kg (130 lb), SpO2 100 %.  Gen: Small framed ill appearing Hispanic man. Skin: no rash, cyanosis  No sig JVD Hyperpneic and mildy tachypneic Lungs grossly clear S1S2 No S3  No pericardial rub Abd mild left sided tenderness.  +  BS  Not distended 2+ edema  LE's + asterixus   Recent Labs  Lab 12/06/17 1348  NA 135  K 5.0  CL 106  CO2 12*  GLUCOSE 93  BUN 136*  CREATININE 15.16*  CALCIUM 6.1*    Recent Labs  Lab 12/06/17 1348  AST 9*  ALT 10*  ALKPHOS 65  BILITOT 0.5  PROT 6.8  ALBUMIN 3.4*   Recent Labs  Lab 12/06/17 1348  LIPASE 40    Recent Labs  Lab 12/06/17 1348  WBC 7.8  HGB 7.9*  HCT 23.6*  MCV 88.1  PLT 247    Recent Labs  Lab 12/06/17 1348  TROPONINI 0.07*    Xrays/Other Studies: Ct Abdomen Pelvis Wo Contrast  Result Date: 12/06/2017 CLINICAL DATA:  Left lower quadrant pain. Pertinent pressure for 7 days. Nausea vomiting and fatigue for 7 days. Diverticulitis suspected. EXAM: CT ABDOMEN AND PELVIS WITHOUT CONTRAST TECHNIQUE: Multidetector CT imaging of the abdomen and pelvis was performed following the standard protocol without IV contrast. COMPARISON:  10/13/2016 renal ultrasound.  No prior CT. FINDINGS: Lower chest: Clear lung bases. Small right larger than left pleural effusions. Borderline cardiomegaly. Hepatobiliary: Normal liver. Normal gallbladder, without biliary ductal dilatation. Pancreas: Normal, without mass or ductal dilatation. Spleen: Normal in size, without focal abnormality. Adrenals/Urinary Tract: Normal adrenal glands. Bilateral renal cortical thinning. Bilateral renal vascular calcifications. Possible concurrent bilateral collecting system calculi. Mild left-sided hydroureteronephrosis, followed to the level of a distal left ureteric 4 mm stone on image 66/series 2. No bladder calculi. Stomach/Bowel: Proximal gastric underdistention. Sigmoid colon diverticulosis. Ascending colonic underdistention. Apparent wall thickening is at least partially felt to be secondary. Example image 41/series 2. Normal terminal ileum. Normal small bowel. Vascular/Lymphatic: Aortic and branch vessel atherosclerosis. No abdominopelvic adenopathy. Reproductive: Mild prostatomegaly. Other: Trace cul-de-sac fluid.   Anasarca. Musculoskeletal: Degenerative disc disease at the lumbosacral junction. IMPRESSION: 1. Mild left-sided urinary tract obstruction secondary to a distal left ureteric calculus. 2. Ascending colonic underdistention. Apparent wall thickening is at least partially secondary. Cannot exclude concurrent colitis. 3. Bilateral pleural effusions and small volume pelvic fluid. Question fluid overload. 4.  Aortic Atherosclerosis (ICD10-I70.0). 5. Limited exam secondary to lack of oral or IV contrast. Electronically Signed   By: Abigail Miyamoto M.D.   On: 12/06/2017 16:12   Dg Chest 2 View  Result Date: 12/06/2017 CLINICAL DATA:  Left upper quadrant pain and pressure for 7 days EXAM: CHEST  2 VIEW COMPARISON:  October 12, 2016 FINDINGS: The heart size and mediastinal contours are within normal limits. Mild central pulmonary vascular congestion is identified. No frank pulmonary edema. Minimal left pleural effusion noted. No focal pneumonia is identified. The visualized skeletal structures are stable. IMPRESSION: Mild central pulmonary vascular congestion without frank pulmonary edema. Minimal left pleural effusion. Electronically Signed  By: Abelardo Diesel M.D.   On: 12/06/2017 16:19    Background:  Assessment/Recommendations  1. CKD 5 - Known CKD 5 10/2016 attributed to poorly controlled HTN and DM. Never followed up with Dr. Marval Regal. Presents ostensibly now with new ESRD. Has mild L hydro from a stone, may account for some of his pain, but with creatinine of 15, relief of obstruction would not afford much in the way of GFR 1. Attempt to get [facility] in IR tomorrow (order placed, keep NPO) 2. Vein mapping, then consult [facility] for AVF 3. Initiation of HD once access in place 4. Start CLIP process   2. Hypocalcemia  - calcium gluconate given at [facility]. Start oral Ca carbonate with and in between meals (get 2 doses in tonight), check phos and PTH 3. Gap Metabolic acidosis (17) - 2/2 1. IV bicarb X1 tonight .   4. Anemia - Aranesp with HD once starts. Check Fe studies 5. Volume overload - dialysis will correct this 6. LUQ abd pain  - ? If related to L renal calculus vs possible colitis.  7. L hydro - mild - correcting this will not add to GFR (though ? Might help pain). Urology has seen, flomax started 8. HTN - meds  Jamal Maes,  MD [facility] [phone] pager 12/06/2017, 4:55 PM

## 2017-12-06 NOTE — ED Triage Notes (Signed)
Pt reports LUQ pain, burning and pressure  x 7 days. C/o NVD and fatigue x 7 days.Stated that he has not been able to eat. His frequents liquid stools stopped 2 days ago. Pt took pepto-bismol and  Tylenol for his symptoms. Pt has hiccups at this time

## 2017-12-06 NOTE — ED Notes (Signed)
Calcium 6.2 Critical. Will update triage and EDP

## 2017-12-07 ENCOUNTER — Encounter (HOSPITAL_COMMUNITY): Payer: Self-pay | Admitting: Interventional Radiology

## 2017-12-07 ENCOUNTER — Inpatient Hospital Stay (HOSPITAL_COMMUNITY): Payer: Self-pay

## 2017-12-07 DIAGNOSIS — N179 Acute kidney failure, unspecified: Secondary | ICD-10-CM

## 2017-12-07 DIAGNOSIS — N185 Chronic kidney disease, stage 5: Secondary | ICD-10-CM

## 2017-12-07 DIAGNOSIS — N201 Calculus of ureter: Secondary | ICD-10-CM

## 2017-12-07 HISTORY — PX: IR US GUIDE VASC ACCESS RIGHT: IMG2390

## 2017-12-07 HISTORY — PX: IR FLUORO GUIDE CV LINE RIGHT: IMG2283

## 2017-12-07 LAB — RENAL FUNCTION PANEL
ANION GAP: 16 — AB (ref 5–15)
Albumin: 2.7 g/dL — ABNORMAL LOW (ref 3.5–5.0)
BUN: 133 mg/dL — ABNORMAL HIGH (ref 6–20)
CHLORIDE: 107 mmol/L (ref 101–111)
CO2: 10 mmol/L — AB (ref 22–32)
CREATININE: 15.8 mg/dL — AB (ref 0.61–1.24)
Calcium: 6.2 mg/dL — CL (ref 8.9–10.3)
GFR, EST AFRICAN AMERICAN: 3 mL/min — AB (ref >60)
GFR, EST NON AFRICAN AMERICAN: 3 mL/min — AB (ref >60)
Glucose, Bld: 68 mg/dL (ref 65–99)
POTASSIUM: 5 mmol/L (ref 3.5–5.1)
Phosphorus: 9.8 mg/dL — ABNORMAL HIGH (ref 2.5–4.6)
Sodium: 133 mmol/L — ABNORMAL LOW (ref 135–145)

## 2017-12-07 LAB — COMPREHENSIVE METABOLIC PANEL
ALBUMIN: 2.5 g/dL — AB (ref 3.5–5.0)
ALT: 8 U/L — ABNORMAL LOW (ref 17–63)
AST: 7 U/L — ABNORMAL LOW (ref 15–41)
Alkaline Phosphatase: 51 U/L (ref 38–126)
Anion gap: 14 (ref 5–15)
BUN: 133 mg/dL — ABNORMAL HIGH (ref 6–20)
CHLORIDE: 109 mmol/L (ref 101–111)
CO2: 11 mmol/L — AB (ref 22–32)
Calcium: 6.2 mg/dL — CL (ref 8.9–10.3)
Creatinine, Ser: 15.89 mg/dL — ABNORMAL HIGH (ref 0.61–1.24)
GFR calc Af Amer: 3 mL/min — ABNORMAL LOW (ref >60)
GFR calc non Af Amer: 3 mL/min — ABNORMAL LOW (ref >60)
GLUCOSE: 83 mg/dL (ref 65–99)
POTASSIUM: 4.7 mmol/L (ref 3.5–5.1)
SODIUM: 134 mmol/L — AB (ref 135–145)
TOTAL PROTEIN: 5 g/dL — AB (ref 6.5–8.1)
Total Bilirubin: 0.4 mg/dL (ref 0.3–1.2)

## 2017-12-07 LAB — IRON AND TIBC
IRON: 39 ug/dL — AB (ref 45–182)
Saturation Ratios: 22 % (ref 17.9–39.5)
TIBC: 178 ug/dL — AB (ref 250–450)
UIBC: 139 ug/dL

## 2017-12-07 LAB — CBC
HCT: 19.9 % — ABNORMAL LOW (ref 39.0–52.0)
HEMOGLOBIN: 6.9 g/dL — AB (ref 13.0–17.0)
MCH: 30.4 pg (ref 26.0–34.0)
MCHC: 34.7 g/dL (ref 30.0–36.0)
MCV: 87.7 fL (ref 78.0–100.0)
PLATELETS: 212 10*3/uL (ref 150–400)
RBC: 2.27 MIL/uL — ABNORMAL LOW (ref 4.22–5.81)
RDW: 14.1 % (ref 11.5–15.5)
WBC: 6.7 10*3/uL (ref 4.0–10.5)

## 2017-12-07 LAB — PREPARE RBC (CROSSMATCH)

## 2017-12-07 LAB — ABO/RH: ABO/RH(D): A POS

## 2017-12-07 LAB — GLUCOSE, CAPILLARY
GLUCOSE-CAPILLARY: 155 mg/dL — AB (ref 65–99)
GLUCOSE-CAPILLARY: 72 mg/dL (ref 65–99)
Glucose-Capillary: 63 mg/dL — ABNORMAL LOW (ref 65–99)
Glucose-Capillary: 92 mg/dL (ref 65–99)
Glucose-Capillary: 66 mg/dL (ref 65–99)
Glucose-Capillary: 87 mg/dL (ref 65–99)
Glucose-Capillary: 76 mg/dL (ref 65–99)

## 2017-12-07 LAB — TROPONIN I
TROPONIN I: 0.07 ng/mL — AB (ref <0.03)
Troponin I: 0.06 ng/mL (ref <0.03)

## 2017-12-07 LAB — ALT: ALT: 8 U/L — AB (ref 17–63)

## 2017-12-07 LAB — PROTIME-INR
INR: 1.25
PROTHROMBIN TIME: 15.6 s — AB (ref 11.4–15.2)

## 2017-12-07 LAB — FERRITIN: FERRITIN: 189 ng/mL (ref 24–336)

## 2017-12-07 LAB — HIV ANTIBODY (ROUTINE TESTING W REFLEX): HIV SCREEN 4TH GENERATION: NONREACTIVE

## 2017-12-07 LAB — APTT: APTT: 41 s — AB (ref 24–36)

## 2017-12-07 MED ORDER — SODIUM CHLORIDE 0.9 % IV SOLN
Freq: Once | INTRAVENOUS | Status: AC
  Administered 2017-12-08: 15:00:00 via INTRAVENOUS

## 2017-12-07 MED ORDER — HEPARIN SODIUM (PORCINE) 1000 UNIT/ML DIALYSIS
1000.0000 [IU] | INTRAMUSCULAR | Status: DC | PRN
  Administered 2017-12-07: 1000 [IU] via INTRAVENOUS_CENTRAL
  Filled 2017-12-07: qty 1

## 2017-12-07 MED ORDER — PROMETHAZINE HCL 25 MG/ML IJ SOLN
25.0000 mg | Freq: Once | INTRAMUSCULAR | Status: DC
  Filled 2017-12-07: qty 1

## 2017-12-07 MED ORDER — SODIUM CHLORIDE 0.9 % IV SOLN
1.0000 g | Freq: Once | INTRAVENOUS | Status: AC
  Administered 2017-12-07: 1 g via INTRAVENOUS
  Filled 2017-12-07: qty 10

## 2017-12-07 MED ORDER — DARBEPOETIN ALFA 200 MCG/0.4ML IJ SOSY: 200.0000 ug | PREFILLED_SYRINGE | INTRAMUSCULAR | Status: DC

## 2017-12-07 MED ORDER — LIDOCAINE HCL (PF) 1 % IJ SOLN
INTRAMUSCULAR | Status: DC | PRN
  Administered 2017-12-07: 2 mL

## 2017-12-07 MED ORDER — HEPARIN SODIUM (PORCINE) 1000 UNIT/ML IJ SOLN
INTRAMUSCULAR | Status: AC
  Administered 2017-12-07: 1000 [IU] via INTRAVENOUS_CENTRAL
  Filled 2017-12-07: qty 1

## 2017-12-07 MED ORDER — HEPARIN SODIUM (PORCINE) 5000 UNIT/ML IJ SOLN
5000.0000 [IU] | Freq: Three times a day (TID) | INTRAMUSCULAR | Status: DC
  Administered 2017-12-08 – 2017-12-16 (×22): 5000 [IU] via SUBCUTANEOUS
  Filled 2017-12-07 (×23): qty 1

## 2017-12-07 MED ORDER — HEPARIN SODIUM (PORCINE) 1000 UNIT/ML IJ SOLN
INTRAMUSCULAR | Status: DC | PRN
  Administered 2017-12-07: 2800 [IU] via INTRAVENOUS

## 2017-12-07 MED ORDER — PROMETHAZINE HCL 25 MG/ML IJ SOLN
INTRAMUSCULAR | Status: AC
  Filled 2017-12-07: qty 1

## 2017-12-07 MED ORDER — SODIUM CHLORIDE 0.9 % IV SOLN
125.0000 mg | Freq: Every day | INTRAVENOUS | Status: AC
  Administered 2017-12-07 – 2017-12-10 (×4): 125 mg via INTRAVENOUS
  Filled 2017-12-07 (×8): qty 10

## 2017-12-07 MED ORDER — HYDROMORPHONE HCL 1 MG/ML IJ SOLN
0.5000 mg | INTRAMUSCULAR | Status: DC | PRN
  Administered 2017-12-07 (×2): 0.5 mg via INTRAVENOUS
  Administered 2017-12-08 – 2017-12-10 (×2): 1 mg via INTRAVENOUS
  Administered 2017-12-10: 0.5 mg via INTRAVENOUS
  Administered 2017-12-11 (×4): 1 mg via INTRAVENOUS
  Filled 2017-12-07 (×9): qty 1

## 2017-12-07 MED ORDER — ONDANSETRON HCL 4 MG/2ML IJ SOLN
INTRAMUSCULAR | Status: AC
  Filled 2017-12-07: qty 2

## 2017-12-07 MED ORDER — DEXTROSE 50 % IV SOLN
INTRAVENOUS | Status: AC
  Administered 2017-12-07 (×2): 25 mL
  Filled 2017-12-07: qty 50

## 2017-12-07 MED ORDER — LIDOCAINE HCL 1 % IJ SOLN
INTRAMUSCULAR | Status: AC
  Filled 2017-12-07: qty 20

## 2017-12-07 NOTE — Progress Notes (Signed)
PROGRESS NOTE  Fernando Jones YPP:[id number] DOB: 11-Apr-1961 DOA: 12/06/2017 PCP: Patient, No Pcp Per   LOS: 1 day   Brief Narrative / Interim history: 57 y.o. Hispanic male, with history of diabetes mellitus, hypertension, CKD V, who came to hospital with abdominal pain with nausea and vomiting. CT showed mild left sided hydronephrosis and distal ureteral calculus, possible ascending colon wall thickening, bilateral pleural effusions. Labs showed BUN 36, creatinine 15.16.  Nephrology and urology were consulted.  He reports that patient has been noncompliant with his medications for his hypertension as well as diabetes.  Assessment & Plan: Active Problems:   DM (diabetes mellitus), type 2 with renal complications (HCC)   Essential hypertension   ESRD (end stage renal disease) ([place])  End-stage renal disease -Seems to be new onset, patient's previous creatinine from 2017 was around 5, on admission 15.17.  He has been noncompliant with his home medications.  BUN is 136, and seems to have symptomatic uremic symptoms with transient episodes of confusion. -Nephrology consulted, patient to have a dialysis catheter placed today and start dialysis ASAP.  Hypertension -Continue home medications with Coreg and hydralazine.  Blood pressure this morning 162/70, will likely need to uptitrate in the upcoming days, but will see what the pressure does with dialysis for now  Left ureteral stone/hydronephrosis -Urology consulted, will probably need to have cystoscopy and stent placement however that needs to be done once acute renal issues/dialysis are addressed first -The patient in significant pain, he has little to no p.o. intake due to nausea, currently n.p.o., for now continue IV pain medications  Elevated troponin -0.05>> 0.06>> 0.07, overall flat, not in a pattern consistent with ACS -No chest pain  History of diabetes mellitus -Last hemoglobin A1c from 2017 was 5.3.  Repeat hemoglobin A1c  now 4.6, this is to be under control, question whether he truly had diabetes in the past or not.  Normocytic anemia likely in the setting of end-stage renal disease -No evidence of bleeding, transfuse 1 unit of packed red blood cells and repeat hemoglobin after wants  ?  Colitis -CT scan concerning for thickening of the ascending colon, however patient is asymptomatic  DVT prophylaxis: heparin Code Status: Full code Family Communication: no family at bedside Disposition Plan: home when ready   Consultants:   Nephrology  Urology  Procedures:   Tunneled HD cath placement 1/2  Antimicrobials:  None  Subjective: -Complains of nausea, no significant vomiting, poor p.o. intake.  Complains of severe left-sided flank pain with minimal movement.  No chest pain or shortness of breath.  Objective: Vitals:   12/07/17 0115 12/07/17 0436 12/07/17 0437 12/07/17 0609  BP: (!) 157/62 (!) 166/74 (!) 166/74 (!) 162/70  Pulse: 94 91 91 85  Resp:  18 18   Temp:  98.8 F (37.1 C) 98.8 F (37.1 C)   TempSrc:  Oral Oral   SpO2:  100% 100% 100%  Weight:        Intake/Output Summary (Last 24 hours) at 12/07/2017 1150 Last data filed at 12/07/2017 0930 Gross per 24 hour  Intake 113 ml  Output 400 ml  Net -287 ml   Filed Weights   12/06/17 1315  Weight: 59 kg (130 lb)    Examination:  Constitutional: No apparent distress Eyes: PERRL, lids and conjunctivae normal ENMT: Moist mucous membranes Respiratory: clear to auscultation bilaterally, no wheezing, no crackles.  Cardiovascular: Regular rate and rhythm, no murmurs / rubs / gallops.  1+ pitting LE edema. 2+ pedal  pulses. No carotid bruits.  Abdomen: no tenderness.  Skin: no rashe Neurologic: Nonfocal Psychiatric: Normal judgment and insight. Alert and oriented x 3. Normal mood.    Data Reviewed: I have independently reviewed following labs and imaging studies   CBC: Recent Labs  Lab 12/06/17 1348 12/06/17 2102  12/07/17 0349  WBC 7.8 8.7 6.7  HGB 7.9* 7.6* 6.9*  HCT 23.6* 22.3* 19.9*  MCV 88.1 87.5 87.7  PLT 247 252 902   Basic Metabolic Panel: Recent Labs  Lab 12/06/17 1348 12/06/17 2102 12/07/17 0345 12/07/17 0920  NA 135  --  133* 134*  K 5.0  --  5.0 4.7  CL 106  --  107 109  CO2 12*  --  10* 11*  GLUCOSE 93  --  68 83  BUN 136*  --  133* 133*  CREATININE 15.16* 15.61* 15.80* 15.89*  CALCIUM 6.1*  --  6.2* 6.2*  PHOS  --   --  9.8*  --    GFR: CrCl cannot be calculated (Unknown ideal weight.). Liver Function Tests: Recent Labs  Lab 12/06/17 1348 12/07/17 0345 12/07/17 0920  AST 9*  --  7*  ALT 10*  --  8*  ALKPHOS 65  --  51  BILITOT 0.5  --  0.4  PROT 6.8  --  5.0*  ALBUMIN 3.4* 2.7* 2.5*   Recent Labs  Lab 12/06/17 1348  LIPASE 40   No results for input(s): AMMONIA in the last 168 hours. Coagulation Profile: Recent Labs  Lab 12/07/17 0349  INR 1.25   Cardiac Enzymes: Recent Labs  Lab 12/06/17 1348 12/06/17 2102 12/07/17 0349 12/07/17 0920  TROPONINI 0.07* 0.05* 0.06* 0.07*   BNP (last 3 results) No results for input(s): PROBNP in the last 8760 hours. HbA1C: Recent Labs    12/06/17 2102  HGBA1C 4.6*   CBG: Recent Labs  Lab 12/07/17 0035 12/07/17 0435 12/07/17 0521 12/07/17 0755 12/07/17 0858  GLUCAP 72 63* 92 66 87   Lipid Profile: No results for input(s): CHOL, HDL, LDLCALC, TRIG, CHOLHDL, LDLDIRECT in the last 72 hours. Thyroid Function Tests: No results for input(s): TSH, T4TOTAL, FREET4, T3FREE, THYROIDAB in the last 72 hours. Anemia Panel: Recent Labs    12/07/17 0349  FERRITIN 189  TIBC 178*  IRON 39*   Urine analysis:    Component Value Date/Time   COLORURINE YELLOW 12/06/2017 1339   APPEARANCEUR CLEAR 12/06/2017 1339   LABSPEC 1.011 12/06/2017 1339   PHURINE 5.0 12/06/2017 1339   GLUCOSEU 50 (A) 12/06/2017 1339   HGBUR MODERATE (A) 12/06/2017 1339   BILIRUBINUR NEGATIVE 12/06/2017 1339   KETONESUR NEGATIVE  12/06/2017 1339   PROTEINUR >=300 (A) 12/06/2017 1339   NITRITE NEGATIVE 12/06/2017 1339   LEUKOCYTESUR NEGATIVE 12/06/2017 1339   Sepsis Labs: Invalid input(s): PROCALCITONIN, LACTICIDVEN  No results found for this or any previous visit (from the past 240 hour(s)).    Radiology Studies: Ct Abdomen Pelvis Wo Contrast  Result Date: 12/06/2017 CLINICAL DATA:  Left lower quadrant pain. Pertinent pressure for 7 days. Nausea vomiting and fatigue for 7 days. Diverticulitis suspected. EXAM: CT ABDOMEN AND PELVIS WITHOUT CONTRAST TECHNIQUE: Multidetector CT imaging of the abdomen and pelvis was performed following the standard protocol without IV contrast. COMPARISON:  10/13/2016 renal ultrasound.  No prior CT. FINDINGS: Lower chest: Clear lung bases. Small right larger than left pleural effusions. Borderline cardiomegaly. Hepatobiliary: Normal liver. Normal gallbladder, without biliary ductal dilatation. Pancreas: Normal, without mass or ductal dilatation. Spleen: Normal in  size, without focal abnormality. Adrenals/Urinary Tract: Normal adrenal glands. Bilateral renal cortical thinning. Bilateral renal vascular calcifications. Possible concurrent bilateral collecting system calculi. Mild left-sided hydroureteronephrosis, followed to the level of a distal left ureteric 4 mm stone on image 66/series 2. No bladder calculi. Stomach/Bowel: Proximal gastric underdistention. Sigmoid colon diverticulosis. Ascending colonic underdistention. Apparent wall thickening is at least partially felt to be secondary. Example image 41/series 2. Normal terminal ileum. Normal small bowel. Vascular/Lymphatic: Aortic and branch vessel atherosclerosis. No abdominopelvic adenopathy. Reproductive: Mild prostatomegaly. Other: Trace cul-de-sac fluid.  Anasarca. Musculoskeletal: Degenerative disc disease at the lumbosacral junction. IMPRESSION: 1. Mild left-sided urinary tract obstruction secondary to a distal left ureteric calculus. 2.  Ascending colonic underdistention. Apparent wall thickening is at least partially secondary. Cannot exclude concurrent colitis. 3. Bilateral pleural effusions and small volume pelvic fluid. Question fluid overload. 4.  Aortic Atherosclerosis (ICD10-I70.0). 5. Limited exam secondary to lack of oral or IV contrast. Electronically Signed   By: Abigail Miyamoto M.D.   On: 12/06/2017 16:12   Dg Chest 2 View  Result Date: 12/06/2017 CLINICAL DATA:  Left upper quadrant pain and pressure for 7 days EXAM: CHEST  2 VIEW COMPARISON:  October 12, 2016 FINDINGS: The heart size and mediastinal contours are within normal limits. Mild central pulmonary vascular congestion is identified. No frank pulmonary edema. Minimal left pleural effusion noted. No focal pneumonia is identified. The visualized skeletal structures are stable. IMPRESSION: Mild central pulmonary vascular congestion without frank pulmonary edema. Minimal left pleural effusion. Electronically Signed   By: Abelardo Diesel M.D.   On: 12/06/2017 16:19   Ir Fluoro Guide Cv Line Right  Result Date: 12/07/2017 INDICATION: End-stage renal disease, currently admitted with anemia and an elevated troponin. Request made for placement of a temporary dialysis catheter for the initiation of dialysis. EXAM: NON-TUNNELED CENTRAL VENOUS HEMODIALYSIS CATHETER PLACEMENT WITH ULTRASOUND AND FLUOROSCOPIC GUIDANCE COMPARISON:  None. MEDICATIONS: None FLUOROSCOPY TIME:  12 seconds (2 mGy) COMPLICATIONS: None immediate. PROCEDURE: Informed written consent was obtained from the patient after a discussion of the risks, benefits, and alternatives to treatment. Questions regarding the procedure were encouraged and answered. The right neck and chest were prepped with chlorhexidine in a sterile fashion, and a sterile drape was applied covering the operative field. Maximum barrier sterile technique with sterile gowns and gloves were used for the procedure. A timeout was performed prior to the  initiation of the procedure. After the overlying soft tissues were anesthetized, a small venotomy incision was created and a micropuncture kit was utilized to access the internal jugular vein. Real-time ultrasound guidance was utilized for vascular access including the acquisition of a permanent ultrasound image documenting patency of the accessed vessel. The microwire was utilized to measure appropriate catheter length. A stiff glidewire was advanced to the level of the IVC. Under fluoroscopic guidance, the venotomy was serially dilated, ultimately allowing placement of a 20 cm temporary Mahurkar catheter with tip ultimately terminating within the superior aspect of the right atrium. Final catheter positioning was confirmed and documented with a spot radiographic image. The catheter aspirates and flushes normally. The catheter was flushed with appropriate volume heparin dwells. The catheter exit site was secured with a 0-Prolene retention suture. A dressing was placed. The patient tolerated the procedure well without immediate post procedural complication. IMPRESSION: Successful placement of a right internal jugular approach 20 cm temporary dialysis catheter with tip terminating with in the superior aspect of the right atrium. The catheter is ready for immediate use. PLAN: This  catheter may be converted to a tunneled dialysis catheter at a later date as indicated. Electronically Signed   By: Sandi Mariscal M.D.   On: 12/07/2017 11:46   Ir US Guide Vasc Access Right  Result Date: 12/07/2017 INDICATION: End-stage renal disease, currently admitted with anemia and an elevated troponin. Request made for placement of a temporary dialysis catheter for the initiation of dialysis. EXAM: NON-TUNNELED CENTRAL VENOUS HEMODIALYSIS CATHETER PLACEMENT WITH ULTRASOUND AND FLUOROSCOPIC GUIDANCE COMPARISON:  None. MEDICATIONS: None FLUOROSCOPY TIME:  12 seconds (2 mGy) COMPLICATIONS: None immediate. PROCEDURE: Informed written  consent was obtained from the patient after a discussion of the risks, benefits, and alternatives to treatment. Questions regarding the procedure were encouraged and answered. The right neck and chest were prepped with chlorhexidine in a sterile fashion, and a sterile drape was applied covering the operative field. Maximum barrier sterile technique with sterile gowns and gloves were used for the procedure. A timeout was performed prior to the initiation of the procedure. After the overlying soft tissues were anesthetized, a small venotomy incision was created and a micropuncture kit was utilized to access the internal jugular vein. Real-time ultrasound guidance was utilized for vascular access including the acquisition of a permanent ultrasound image documenting patency of the accessed vessel. The microwire was utilized to measure appropriate catheter length. A stiff glidewire was advanced to the level of the IVC. Under fluoroscopic guidance, the venotomy was serially dilated, ultimately allowing placement of a 20 cm temporary Mahurkar catheter with tip ultimately terminating within the superior aspect of the right atrium. Final catheter positioning was confirmed and documented with a spot radiographic image. The catheter aspirates and flushes normally. The catheter was flushed with appropriate volume heparin dwells. The catheter exit site was secured with a 0-Prolene retention suture. A dressing was placed. The patient tolerated the procedure well without immediate post procedural complication. IMPRESSION: Successful placement of a right internal jugular approach 20 cm temporary dialysis catheter with tip terminating with in the superior aspect of the right atrium. The catheter is ready for immediate use. PLAN: This catheter may be converted to a tunneled dialysis catheter at a later date as indicated. Electronically Signed   By: Sandi Mariscal M.D.   On: 12/07/2017 11:46     Scheduled Meds: . aspirin EC  81 mg  Oral Daily  . calcium carbonate  4 tablet Oral TID WC  . calcium carbonate  4 tablet Oral TID  . carvedilol  12.5 mg Oral BID WC  . heparin      . [START ON 12/08/2017] heparin  5,000 Units Subcutaneous Q8H  . hydrALAZINE  25 mg Oral Q8H  . lidocaine      . promethazine      . promethazine  25 mg Intravenous Once  . sodium chloride flush  3 mL Intravenous Q12H   Continuous Infusions: . sodium chloride    . sodium chloride      Marzetta Board, MD, PhD [facility] Pager [phone]  If 7PM-7AM, please contact night-coverage [email or web address] Password [email or web address] 12/07/2017, 11:50 AM

## 2017-12-07 NOTE — Procedures (Signed)
Pre-procedure Diagnosis: ESRD Post-procedure Diagnosis: Same  Successful placement of a non-tunneled HD catheter with tips terminating within the superior aspect of the right atrium.    Complications: None Immediate  EBL: Minimal   The catheter is ready for immediate use.   Fernando Lexiana Spindel, MD Pager #: [phone]   

## 2017-12-07 NOTE — Progress Notes (Signed)
CRITICAL VALUE ALERT  Critical Value:  Calcium 6.2 and Troponin 0.07  Date & Time Notified:  12/07/16 at 1120  Provider Notified: Dr. Cruzita Lederer  Orders Received/Actions taken: Pending. Patient currently at IR for catheter placement.

## 2017-12-07 NOTE — Progress Notes (Signed)
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 Never followed up with Dr. Marval Regal. Presents ostensibly now with new ESRD, metabolic acidosis, hypocalcemia.  Assessment/Recommendations  1. CKD 5 - New ESRD 1. D/t severe anemia, IR placed temp rather than TDC 2. V Mapping ordered 3. Will ask [facility] to tunnel HD cath AND for AVF/AVG 4. Initiation of HD once access in place 1st TMT today, 2nd tomorrow 5. Start CLIP process  2. Mild L hydro from a stone.  May account for some of his pain, but with creatinine of 15, relief of obstruction would not afford much in the way of GFR.  Urology is aware of pt. Flomax mentioned in Urology note but not started 2. Hypocalcemia  - Started oral Ca carbonate with and in between meals. Phos 9.8 PTH pending 3. Gap Metabolic acidosis (17) - 2/2 1. IV bicarb last PM. HD will correct 4. Anemia - Aranesp with HD. Start today 200 QWed. 4 dose Fe load. PRBC's today for Hb 6.9.  5. Volume overload - dialysis will correct this 6. LUQ abd pain  - ? If related to L renal calculus vs possible colitis.  7. HTN - meds  Subjective/Interval History:  Temp cath placed by IR today For HD later today Will call [facility] back and ask them if can tunnel HD cath when they do his permanent access Still nauseous Not as anxious  Objective Vital signs in last 24 hours: Vitals:   12/07/17 0437 12/07/17 0609 12/07/17 1251 12/07/17 1313  BP: (!) 166/74 (!) 162/70 (!) 162/71 (!) 179/72  Pulse: 91 85 83 80  Resp: 18  16 16   Temp: 98.8 F (37.1 C)  98.3 F (36.8 C) 98.3 F (36.8 C)  TempSrc: Oral  Oral Oral  SpO2: 100% 100% 100% 100%  Weight:       Weight change:   Intake/Output Summary (Last 24 hours) at 12/07/2017 1349 Last data filed at 12/07/2017 1258 Gross per 24 hour  Intake 143 ml  Output 400 ml  Net -257 ml   Physical Exam:  Blood pressure (!) 179/72, pulse 80, temperature 98.3 F (36.8 C),  temperature source Oral, resp. rate 16, weight 59 kg (130 lb), SpO2 100 %.   Small framed Hispanic man Getting transfusion at this time Appears comfortable + JVD Basilar crackles only, anteriorly clear No pericardial rub S1S2 No S3 Soft 1/6 systolic murmur ULSB No diastolic murmur Abd soft, NT 3+ LE pitting edema   Recent Labs  Lab 12/06/17 1348 12/06/17 2102 12/07/17 0345 12/07/17 0920  NA 135  --  133* 134*  K 5.0  --  5.0 4.7  CL 106  --  107 109  CO2 12*  --  10* 11*  GLUCOSE 93  --  68 83  BUN 136*  --  133* 133*  CREATININE 15.16* 15.61* 15.80* 15.89*  CALCIUM 6.1*  --  6.2* 6.2*  PHOS  --   --  9.8*  --     Recent Labs  Lab 12/06/17 1348 12/07/17 0345 12/07/17 0920  AST 9*  --  7*  ALT 10*  --  8*  ALKPHOS 65  --  51  BILITOT 0.5  --  0.4  PROT 6.8  --  5.0*  ALBUMIN 3.4* 2.7* 2.5*   Recent Labs  Lab 12/06/17 1348  LIPASE 40    Recent Labs  Lab 12/06/17 1348 12/06/17 2102 12/07/17 0349  WBC  7.8 8.7 6.7  HGB 7.9* 7.6* 6.9*  HCT 23.6* 22.3* 19.9*  MCV 88.1 87.5 87.7  PLT 247 252 212    Recent Labs  Lab 12/06/17 1348 12/06/17 2102 12/07/17 0349 12/07/17 0920  TROPONINI 0.07* 0.05* 0.06* 0.07*   CBG: Recent Labs  Lab 12/07/17 0435 12/07/17 0521 12/07/17 0755 12/07/17 0858 12/07/17 1214  GLUCAP 63* 92 66 87 76    Iron Studies:  Recent Labs  Lab 12/07/17 0349  IRON 39*  TIBC 178*  FERRITIN 189   Studies/Results: Ct Abdomen Pelvis Wo Contrast  Result Date: 12/06/2017 CLINICAL DATA:  Left lower quadrant pain. Pertinent pressure for 7 days. Nausea vomiting and fatigue for 7 days. Diverticulitis suspected. EXAM: CT ABDOMEN AND PELVIS WITHOUT CONTRAST TECHNIQUE: Multidetector CT imaging of the abdomen and pelvis was performed following the standard protocol without IV contrast. COMPARISON:  10/13/2016 renal ultrasound.  No prior CT. FINDINGS: Lower chest: Clear lung bases. Small right larger than left pleural effusions. Borderline  cardiomegaly. Hepatobiliary: Normal liver. Normal gallbladder, without biliary ductal dilatation. Pancreas: Normal, without mass or ductal dilatation. Spleen: Normal in size, without focal abnormality. Adrenals/Urinary Tract: Normal adrenal glands. Bilateral renal cortical thinning. Bilateral renal vascular calcifications. Possible concurrent bilateral collecting system calculi. Mild left-sided hydroureteronephrosis, followed to the level of a distal left ureteric 4 mm stone on image 66/series 2. No bladder calculi. Stomach/Bowel: Proximal gastric underdistention. Sigmoid colon diverticulosis. Ascending colonic underdistention. Apparent wall thickening is at least partially felt to be secondary. Example image 41/series 2. Normal terminal ileum. Normal small bowel. Vascular/Lymphatic: Aortic and branch vessel atherosclerosis. No abdominopelvic adenopathy. Reproductive: Mild prostatomegaly. Other: Trace cul-de-sac fluid.  Anasarca. Musculoskeletal: Degenerative disc disease at the lumbosacral junction. IMPRESSION: 1. Mild left-sided urinary tract obstruction secondary to a distal left ureteric calculus. 2. Ascending colonic underdistention. Apparent wall thickening is at least partially secondary. Cannot exclude concurrent colitis. 3. Bilateral pleural effusions and small volume pelvic fluid. Question fluid overload. 4.  Aortic Atherosclerosis (ICD10-I70.0). 5. Limited exam secondary to lack of oral or IV contrast. Electronically Signed   By: Abigail Miyamoto M.D.   On: 12/06/2017 16:12   Dg Chest 2 View  Result Date: 12/06/2017 CLINICAL DATA:  Left upper quadrant pain and pressure for 7 days EXAM: CHEST  2 VIEW COMPARISON:  October 12, 2016 FINDINGS: The heart size and mediastinal contours are within normal limits. Mild central pulmonary vascular congestion is identified. No frank pulmonary edema. Minimal left pleural effusion noted. No focal pneumonia is identified. The visualized skeletal structures are stable.  IMPRESSION: Mild central pulmonary vascular congestion without frank pulmonary edema. Minimal left pleural effusion. Electronically Signed   By: Abelardo Diesel M.D.   On: 12/06/2017 16:19   Ir Fluoro Guide Cv Line Right  Result Date: 12/07/2017 INDICATION: End-stage renal disease, currently admitted with anemia and an elevated troponin. Request made for placement of a temporary dialysis catheter for the initiation of dialysis. EXAM: NON-TUNNELED CENTRAL VENOUS HEMODIALYSIS CATHETER PLACEMENT WITH ULTRASOUND AND FLUOROSCOPIC GUIDANCE COMPARISON:  None. MEDICATIONS: None FLUOROSCOPY TIME:  12 seconds (2 mGy) COMPLICATIONS: None immediate. PROCEDURE: Informed written consent was obtained from the patient after a discussion of the risks, benefits, and alternatives to treatment. Questions regarding the procedure were encouraged and answered. The right neck and chest were prepped with chlorhexidine in a sterile fashion, and a sterile drape was applied covering the operative field. Maximum barrier sterile technique with sterile gowns and gloves were used for the procedure. A timeout was performed prior to  the initiation of the procedure. After the overlying soft tissues were anesthetized, a small venotomy incision was created and a micropuncture kit was utilized to access the internal jugular vein. Real-time ultrasound guidance was utilized for vascular access including the acquisition of a permanent ultrasound image documenting patency of the accessed vessel. The microwire was utilized to measure appropriate catheter length. A stiff glidewire was advanced to the level of the IVC. Under fluoroscopic guidance, the venotomy was serially dilated, ultimately allowing placement of a 20 cm temporary Mahurkar catheter with tip ultimately terminating within the superior aspect of the right atrium. Final catheter positioning was confirmed and documented with a spot radiographic image. The catheter aspirates and flushes normally.  The catheter was flushed with appropriate volume heparin dwells. The catheter exit site was secured with a 0-Prolene retention suture. A dressing was placed. The patient tolerated the procedure well without immediate post procedural complication. IMPRESSION: Successful placement of a right internal jugular approach 20 cm temporary dialysis catheter with tip terminating with in the superior aspect of the right atrium. The catheter is ready for immediate use. PLAN: This catheter may be converted to a tunneled dialysis catheter at a later date as indicated. Electronically Signed   By: Sandi Mariscal M.D.   On: 12/07/2017 11:46   Ir US Guide Vasc Access Right  Result Date: 12/07/2017 INDICATION: End-stage renal disease, currently admitted with anemia and an elevated troponin. Request made for placement of a temporary dialysis catheter for the initiation of dialysis. EXAM: NON-TUNNELED CENTRAL VENOUS HEMODIALYSIS CATHETER PLACEMENT WITH ULTRASOUND AND FLUOROSCOPIC GUIDANCE COMPARISON:  None. MEDICATIONS: None FLUOROSCOPY TIME:  12 seconds (2 mGy) COMPLICATIONS: None immediate. PROCEDURE: Informed written consent was obtained from the patient after a discussion of the risks, benefits, and alternatives to treatment. Questions regarding the procedure were encouraged and answered. The right neck and chest were prepped with chlorhexidine in a sterile fashion, and a sterile drape was applied covering the operative field. Maximum barrier sterile technique with sterile gowns and gloves were used for the procedure. A timeout was performed prior to the initiation of the procedure. After the overlying soft tissues were anesthetized, a small venotomy incision was created and a micropuncture kit was utilized to access the internal jugular vein. Real-time ultrasound guidance was utilized for vascular access including the acquisition of a permanent ultrasound image documenting patency of the accessed vessel. The microwire was utilized  to measure appropriate catheter length. A stiff glidewire was advanced to the level of the IVC. Under fluoroscopic guidance, the venotomy was serially dilated, ultimately allowing placement of a 20 cm temporary Mahurkar catheter with tip ultimately terminating within the superior aspect of the right atrium. Final catheter positioning was confirmed and documented with a spot radiographic image. The catheter aspirates and flushes normally. The catheter was flushed with appropriate volume heparin dwells. The catheter exit site was secured with a 0-Prolene retention suture. A dressing was placed. The patient tolerated the procedure well without immediate post procedural complication. IMPRESSION: Successful placement of a right internal jugular approach 20 cm temporary dialysis catheter with tip terminating with in the superior aspect of the right atrium. The catheter is ready for immediate use. PLAN: This catheter may be converted to a tunneled dialysis catheter at a later date as indicated. Electronically Signed   By: Sandi Mariscal M.D.   On: 12/07/2017 11:46   Medications: . sodium chloride    . sodium chloride     . aspirin EC  81 mg Oral Daily  .  calcium carbonate  4 tablet Oral TID WC  . calcium carbonate  4 tablet Oral TID  . carvedilol  12.5 mg Oral BID WC  . heparin      . [START ON 12/08/2017] heparin  5,000 Units Subcutaneous Q8H  . hydrALAZINE  25 mg Oral Q8H  . lidocaine      . promethazine      . promethazine  25 mg Intravenous Once  . sodium chloride flush  3 mL Intravenous Q12H    Jamal Maes, MD [facility] [phone] pager 12/07/2017, 1:49 PM

## 2017-12-07 NOTE — Consult Note (Signed)
CONSULT NOTE   MRN : [id number]  Reason for Consult: ESRD Referring Physician: Dr. Lorrene Reid  History of Present Illness: Fernando Jones is a 57 y.o. ([birth year]) LHD male  who is now ESRD.  He has a history of CKD stage V in 2017.  He reported to the ED with SOB and left flank pain.   His Cr at admission was > 15.0.  We have been asked to provide permanent access.  Past medical history includes: DM, HTN, and hyperlipidemia.  Patient has no history of PPM placement.  Pt's RIJV TDC was placed today by IR.  Pt is starting HD.    Past Medical History:  Diagnosis Date  . Erectile dysfunction 2012  . ESRD (end stage renal disease) ([place])    w/Left ureteral stone/hydronephrosis/notes 12/06/2017  . Hepatitis 1973   "? kind"  . Hyperlipidemia 2012  . Hypertension   . Tobacco abuse 2012   1/2 pack per day  . Type II diabetes mellitus ([place])     Past Surgical History:  Procedure Laterality Date  . APPENDECTOMY    . CARDIAC CATHETERIZATION  05/12/2009   Archie Endo 04/07/2011  . IR FLUORO GUIDE CV LINE RIGHT  12/07/2017  . IR US GUIDE VASC ACCESS RIGHT  12/07/2017    Social History   Socioeconomic History  . Marital status: Divorced    Spouse name: Not on file  . Number of children: Not on file  . Years of education: Not on file  . Highest education level: Not on file  Social Needs  . Financial resource strain: Not on file  . Food insecurity - worry: Not on file  . Food insecurity - inability: Not on file  . Transportation needs - medical: Not on file  . Transportation needs - non-medical: Not on file  Occupational History  . Not on file  Tobacco Use  . Smoking status: Former Smoker    Packs/day: 1.00    Types: Cigarettes    Last attempt to quit: 12/06/2016    Years since quitting: 1.0  . Smokeless tobacco: Never Used  Substance and Sexual Activity  . Alcohol use: No  . Drug use: No  . Sexual activity: Not Currently  Other Topics Concern  . Not on file  Social History  Narrative  . Not on file    Family History  Problem Relation Age of Onset  . Congestive Heart Failure Mother   . Diabetes Neg Hx     Current Facility-Administered Medications  Medication Dose Route Frequency Provider Last Rate Last Dose  . 0.9 %  sodium chloride infusion  250 mL Intravenous PRN [place], Gagan S, MD      . 0.9 %  sodium chloride infusion   Intravenous Once Schorr, Rhetta Mura, NP      . acetaminophen (TYLENOL) tablet 650 mg  650 mg Oral Q6H PRN Oswald Hillock, MD   650 mg at 12/07/17 3614   Or  . acetaminophen (TYLENOL) suppository 650 mg  650 mg Rectal Q6H PRN Oswald Hillock, MD      . aspirin EC tablet 81 mg  81 mg Oral Daily Oswald Hillock, MD   81 mg at 12/07/17 0930  . calcium carbonate (TUMS - dosed in mg elemental calcium) chewable tablet 800 mg of elemental calcium  4 tablet Oral TID WC Jamal Maes, MD   800 mg of elemental calcium at 12/07/17 1307  . calcium carbonate (TUMS - dosed in mg elemental calcium)  chewable tablet 800 mg of elemental calcium  4 tablet Oral TID Jamal Maes, MD   800 mg of elemental calcium at 12/07/17 0930  . carvedilol (COREG) tablet 12.5 mg  12.5 mg Oral BID WC Oswald Hillock, MD   12.5 mg at 12/07/17 0807  . [START ON 12/14/2017] Darbepoetin Alfa (ARANESP) injection 200 mcg  200 mcg Intravenous Q Wed-HD Jamal Maes, MD      . ferric gluconate (NULECIT) 125 mg in sodium chloride 0.9 % 100 mL IVPB  125 mg Intravenous Daily Jamal Maes, MD      . heparin 1000 UNIT/ML injection           . [START ON 12/08/2017] heparin injection 5,000 Units  5,000 Units Subcutaneous Q8H Monia Sabal, PA-C      . heparin injection   Intravenous PRN Sandi Mariscal, MD   2,800 Units at 12/07/17 1125  . hydrALAZINE (APRESOLINE) injection 10 mg  10 mg Intravenous Q4H PRN Oswald Hillock, MD   10 mg at 12/07/17 0028  . hydrALAZINE (APRESOLINE) tablet 25 mg  25 mg Oral Q8H Oswald Hillock, MD   25 mg at 12/07/17 1307  . HYDROmorphone (DILAUDID) injection 0.5-1 mg   0.5-1 mg Intravenous Q4H PRN Caren Griffins, MD   0.5 mg at 12/07/17 0932  . lidocaine (PF) (XYLOCAINE) 1 % injection    PRN Sandi Mariscal, MD   2 mL at 12/07/17 1121  . lidocaine (XYLOCAINE) 1 % (with pres) injection           . ondansetron (ZOFRAN) tablet 4 mg  4 mg Oral Q6H PRN Oswald Hillock, MD       Or  . ondansetron [facility]) injection 4 mg  4 mg Intravenous Q6H PRN Oswald Hillock, MD   4 mg at 12/07/17 4008  . promethazine (PHENERGAN) 25 MG/ML injection           . promethazine (PHENERGAN) injection 25 mg  25 mg Intravenous Once Sandi Mariscal, MD      . sodium chloride flush (NS) 0.9 % injection 3 mL  3 mL Intravenous Q12H Oswald Hillock, MD   3 mL at 12/07/17 0931  . sodium chloride flush (NS) 0.9 % injection 3 mL  3 mL Intravenous PRN Oswald Hillock, MD        Pt meds include: Statin :No Betablocker: Yes ASA: Yes Other anticoagulants/antiplatelets: none   No Known Allergies  REVIEW OF SYSTEMS (negative unless checked):   Cardiac:  []  Chest pain or chest pressure? []  Shortness of breath upon activity? []  Shortness of breath when lying flat? []  Irregular heart rhythm?  Vascular:  []  Pain in calf, thigh, or hip brought on by walking? []  Pain in feet at night that wakes you up from your sleep? []  Blood clot in your veins? []  Leg swelling?  Pulmonary:  []  Oxygen at home? []  Productive cough? []  Wheezing?  Neurologic:  []  Sudden weakness in arms or legs? []  Sudden numbness in arms or legs? []  Sudden onset of difficult speaking or slurred speech? []  Temporary loss of vision in one eye? []  Problems with dizziness?  Gastrointestinal:  []  Blood in stool? []  Vomited blood?  Genitourinary:  []  Burning when urinating? []  Blood in urine?  Psychiatric:  []  Major depression  Hematologic:  []  Bleeding problems? []  Problems with blood clotting?  Dermatologic:  []  Rashes or ulcers?  Constitutional:  []  Fever or chills?  Ear/Nose/Throat:  []  Change in hearing? []   Nose bleeds? []  Sore throat?  Musculoskeletal:  []  Back pain? []  Joint pain? []  Muscle pain?     Physical Examination Vitals:   12/07/17 0115 12/07/17 0436 12/07/17 0437 12/07/17 0609  BP: (!) 157/62 (!) 166/74 (!) 166/74 (!) 162/70  Pulse: 94 91 91 85  Resp:  18 18   Temp:  98.8 F (37.1 C) 98.8 F (37.1 C)   TempSrc:  Oral Oral   SpO2:  100% 100% 100%  Weight:       Body mass index is 21.63 kg/m.  General:  WDWN in NA, ill and fatigued appearing HENT: WNL Eyes: Pupils equal Pulmonary: normal non-labored breathing , without Rales, rhonchi,  wheezing Cardiac: RRR, without  Murmurs, rubs or gallops; No carotid bruits Abdomen: soft, NT, no masses Skin: no rashes, ulcers noted;  no Gangrene , no cellulitis; no open wounds;  Vascular Exam/Pulses:Palpable radial, brachial and Pedal pulses B Musculoskeletal: no muscle wasting or atrophy; B LE edema  Neurologic: A&O X 3; Appropriate Affect; SENSATION: normal; MOTOR FUNCTION: 5/5 Symmetric; Speech is fluent/normal Psychiatric: Judgment intact, Mood & affect appropriate for pt's clinical situation Lymph : No Cervical, Axillary, or Inguinal lymphadenopathy    Significant Diagnostic Studies: CBC Lab Results  Component Value Date   WBC 6.7 12/07/2017   HGB 6.9 (LL) 12/07/2017   HCT 19.9 (L) 12/07/2017   MCV 87.7 12/07/2017   PLT 212 12/07/2017    BMET    Component Value Date/Time   NA 133 (L) 12/07/2017 0345   K 5.0 12/07/2017 0345   CL 107 12/07/2017 0345   CO2 10 (L) 12/07/2017 0345   GLUCOSE 68 12/07/2017 0345   BUN 133 (H) 12/07/2017 0345   CREATININE 15.80 (H) 12/07/2017 0345   CALCIUM 6.2 (LL) 12/07/2017 0345   GFRNONAA 3 (L) 12/07/2017 0345   GFRAA 3 (L) 12/07/2017 0345   CrCl cannot be calculated (Unknown ideal weight.).  COAG Lab Results  Component Value Date   INR 1.25 12/07/2017   INR 1.0 05/12/2009   INR 1.0 05/09/2009     Non-Invasive Vascular Imaging: vein mapping  pending  ASSESSMENT/PLAN:  ESRD Planned TDC by IR today I have ordered vein mapping.  He is left hand dominant, so our plan will be right UE av fistula verses graft.  He has visible veins on his forearm that are compressible.  He may be a candidate for radio cephalic av fistula.     Roxy Horseman 12/07/2017 9:23 AM  Addendum  I have independently interviewed and examined the patient, and I agree with the physician assistant's findings.  Pt is LHD, so will try for R arm access.  Pt's R distal cephalic vein externally looks like a viable option with palpable radial artery in that arm, suggesting R RC vs BC AVF.      Risk, benefits, and alternatives to access surgery were discussed.    The patient is aware the risks include but are not limited to: bleeding, infection, steal syndrome, nerve damage, ischemic monomelic neuropathy, thrombosis, failure to mature, complications related to venous hypertension, need for additional procedures, death and stroke.    The patient agrees to proceed forward with the procedure.  Will post patient for Friday for R arm AVF vs AVG.  Once vein mapping back, will further narrow the access options.   Adele Barthel, MD, FACS [facility] [place] Office: [phone] Pager: [phone]  12/07/2017, 2:06 PM

## 2017-12-07 NOTE — Progress Notes (Signed)
CRITICAL VALUE ALERT  Critical Value: Hemoglobin 6.9  Date & Time Notied:  12/07/2017 0440  Provider Notified: Lamar Blinks, NP  Orders Received/Actions taken: Awaiting response

## 2017-12-07 NOTE — Progress Notes (Addendum)
HD tx initiated via HD cath w/o problem, pull/push/flush equally w/o problem, VSS, will cont to monitor while on HD tx 

## 2017-12-07 NOTE — Progress Notes (Signed)
CBG this a.m. 63, Dextrose 50% 20ml given IV per hypoglycemic protocol. RN will reassess CBG and make MD aware of any changes in patient condition. P.J. Linus Mako, RN

## 2017-12-07 NOTE — Progress Notes (Signed)
CRITICAL VALUE ALERT  Critical Value:  Calcium 6.2  Date & Time Notied:  12/07/2017 8403  Provider Notified: Lamar Blinks, NP  Orders Received/Actions taken: Awaiting response

## 2017-12-07 NOTE — Progress Notes (Signed)
Hypoglycemic Event  CBG: 66  Treatment: Given half amp of D50 (patient NPO)  Symptoms: Headache  Follow-up CBG: Time: 0858 CBG Result: 87  Possible Reasons for Event: Patient has been NPO for catheter insertion.   Comments/MD notified: N/A    Fernando Jones

## 2017-12-07 NOTE — Progress Notes (Signed)
RN notified Lamar Blinks, NP that troponin is 0.06, awaiting response. P.J. Linus Mako, RN

## 2017-12-07 NOTE — Progress Notes (Signed)
RN paged Lamar Blinks, NP again to make aware of patient's critical hemoglobin of 6.9, awaiting response. P.J. Linus Mako, RN

## 2017-12-07 NOTE — Progress Notes (Signed)
Order to transfuse 1 unit PRBCs for Hgb 6.9. Transfusion began at 1258 and tolerating well. Will continue to monitor.

## 2017-12-07 NOTE — Progress Notes (Signed)
HD tx completed w/o problem, UF goal met, blood rinsed back, VSS, report called to Gilmer Mor, RN

## 2017-12-08 ENCOUNTER — Inpatient Hospital Stay (HOSPITAL_COMMUNITY): Payer: Self-pay

## 2017-12-08 ENCOUNTER — Inpatient Hospital Stay (HOSPITAL_COMMUNITY): Payer: Self-pay | Admitting: Certified Registered Nurse Anesthetist

## 2017-12-08 ENCOUNTER — Encounter (HOSPITAL_COMMUNITY)
Admission: EM | Disposition: A | Discharge status: Home Health | Payer: Self-pay | Source: Home / Self Care | Attending: Internal Medicine

## 2017-12-08 ENCOUNTER — Other Ambulatory Visit: Payer: Self-pay | Admitting: Urology

## 2017-12-08 ENCOUNTER — Telehealth: Payer: Self-pay | Admitting: *Deleted

## 2017-12-08 DIAGNOSIS — N186 End stage renal disease: Secondary | ICD-10-CM

## 2017-12-08 HISTORY — PX: HOLMIUM LASER APPLICATION: SHX5852

## 2017-12-08 HISTORY — PX: CYSTOSCOPY/URETEROSCOPY/HOLMIUM LASER/STENT PLACEMENT: SHX6546

## 2017-12-08 LAB — GLUCOSE, CAPILLARY
GLUCOSE-CAPILLARY: 125 mg/dL — AB (ref 65–99)
GLUCOSE-CAPILLARY: 138 mg/dL — AB (ref 65–99)
GLUCOSE-CAPILLARY: 65 mg/dL (ref 65–99)
GLUCOSE-CAPILLARY: 78 mg/dL (ref 65–99)
GLUCOSE-CAPILLARY: 83 mg/dL (ref 65–99)
GLUCOSE-CAPILLARY: 88 mg/dL (ref 65–99)
Glucose-Capillary: 87 mg/dL (ref 65–99)
Glucose-Capillary: 111 mg/dL — ABNORMAL HIGH (ref 65–99)

## 2017-12-08 LAB — CBC
HEMATOCRIT: 23.7 % — AB (ref 39.0–52.0)
HEMOGLOBIN: 8.1 g/dL — AB (ref 13.0–17.0)
MCH: 30 pg (ref 26.0–34.0)
MCHC: 34.2 g/dL (ref 30.0–36.0)
MCV: 87.8 fL (ref 78.0–100.0)
Platelets: 211 10*3/uL (ref 150–400)
RBC: 2.7 MIL/uL — AB (ref 4.22–5.81)
RDW: 14.1 % (ref 11.5–15.5)
WBC: 6 10*3/uL (ref 4.0–10.5)

## 2017-12-08 LAB — TYPE AND SCREEN
ABO/RH(D): A POS
Antibody Screen: NEGATIVE
Unit division: 0

## 2017-12-08 LAB — COMPREHENSIVE METABOLIC PANEL
ALBUMIN: 2.5 g/dL — AB (ref 3.5–5.0)
ALK PHOS: 55 U/L (ref 38–126)
ALT: 9 U/L — ABNORMAL LOW (ref 17–63)
ANION GAP: 13 (ref 5–15)
AST: 10 U/L — ABNORMAL LOW (ref 15–41)
BILIRUBIN TOTAL: 0.5 mg/dL (ref 0.3–1.2)
BUN: 80 mg/dL — ABNORMAL HIGH (ref 6–20)
CALCIUM: 6.5 mg/dL — AB (ref 8.9–10.3)
CO2: 18 mmol/L — ABNORMAL LOW (ref 22–32)
Chloride: 105 mmol/L (ref 101–111)
Creatinine, Ser: 11.15 mg/dL — ABNORMAL HIGH (ref 0.61–1.24)
GFR, EST AFRICAN AMERICAN: 5 mL/min — AB (ref >60)
GFR, EST NON AFRICAN AMERICAN: 4 mL/min — AB (ref >60)
Glucose, Bld: 78 mg/dL (ref 65–99)
POTASSIUM: 4 mmol/L (ref 3.5–5.1)
Sodium: 136 mmol/L (ref 135–145)
TOTAL PROTEIN: 5.2 g/dL — AB (ref 6.5–8.1)

## 2017-12-08 LAB — BPAM RBC
Blood Product Expiration Date: 201901282359
ISSUE DATE / TIME: 201901021239
Unit Type and Rh: 6200

## 2017-12-08 LAB — PARATHYROID HORMONE, INTACT (NO CA): PTH: 404 pg/mL — ABNORMAL HIGH (ref 15–65)

## 2017-12-08 LAB — MAGNESIUM: Magnesium: 1.8 mg/dL (ref 1.7–2.4)

## 2017-12-08 SURGERY — CYSTOSCOPY/URETEROSCOPY/HOLMIUM LASER/STENT PLACEMENT
Anesthesia: General | Laterality: Left

## 2017-12-08 MED ORDER — ONDANSETRON HCL 4 MG/2ML IJ SOLN
INTRAMUSCULAR | Status: AC
  Filled 2017-12-08: qty 2

## 2017-12-08 MED ORDER — OXYCODONE HCL 5 MG/5ML PO SOLN
5.0000 mg | Freq: Once | ORAL | Status: DC | PRN
  Filled 2017-12-08: qty 5

## 2017-12-08 MED ORDER — SUCCINYLCHOLINE CHLORIDE 200 MG/10ML IV SOSY
PREFILLED_SYRINGE | INTRAVENOUS | Status: DC | PRN
  Administered 2017-12-08: 120 mg via INTRAVENOUS

## 2017-12-08 MED ORDER — HYDRALAZINE HCL 20 MG/ML IJ SOLN
10.0000 mg | Freq: Once | INTRAMUSCULAR | Status: AC
  Administered 2017-12-08: 10 mg via INTRAVENOUS

## 2017-12-08 MED ORDER — METOCLOPRAMIDE HCL 5 MG/ML IJ SOLN
INTRAMUSCULAR | Status: DC | PRN
  Administered 2017-12-08: 5 mg via INTRAVENOUS

## 2017-12-08 MED ORDER — SODIUM CHLORIDE 0.9 % IR SOLN
Status: DC | PRN
  Administered 2017-12-08: 3000 mL via INTRAVESICAL

## 2017-12-08 MED ORDER — CEFAZOLIN SODIUM-DEXTROSE 2-4 GM/100ML-% IV SOLN
INTRAVENOUS | Status: AC
  Filled 2017-12-08: qty 100

## 2017-12-08 MED ORDER — LIDOCAINE 2% (20 MG/ML) 5 ML SYRINGE
INTRAMUSCULAR | Status: DC | PRN
  Administered 2017-12-08: 20 mg via INTRAVENOUS

## 2017-12-08 MED ORDER — CEFAZOLIN SODIUM-DEXTROSE 2-4 GM/100ML-% IV SOLN
2.0000 g | INTRAVENOUS | Status: AC
  Administered 2017-12-08: 2 g via INTRAVENOUS

## 2017-12-08 MED ORDER — PROPOFOL 10 MG/ML IV BOLUS
INTRAVENOUS | Status: AC
  Filled 2017-12-08: qty 20

## 2017-12-08 MED ORDER — FENTANYL CITRATE (PF) 100 MCG/2ML IJ SOLN: 25.0000 ug | INTRAMUSCULAR | Status: DC | PRN

## 2017-12-08 MED ORDER — EPHEDRINE 5 MG/ML INJ
INTRAVENOUS | Status: AC
  Filled 2017-12-08: qty 10

## 2017-12-08 MED ORDER — DARBEPOETIN ALFA 200 MCG/0.4ML IJ SOSY
200.0000 ug | PREFILLED_SYRINGE | INTRAMUSCULAR | Status: DC
  Administered 2017-12-09: 200 ug via INTRAVENOUS
  Filled 2017-12-08 (×2): qty 0.4

## 2017-12-08 MED ORDER — METOCLOPRAMIDE HCL 5 MG/ML IJ SOLN
INTRAMUSCULAR | Status: AC
  Filled 2017-12-08: qty 2

## 2017-12-08 MED ORDER — IOHEXOL 300 MG/ML  SOLN
INTRAMUSCULAR | Status: DC | PRN
  Administered 2017-12-08: 3 mL via URETHRAL

## 2017-12-08 MED ORDER — DEXAMETHASONE SODIUM PHOSPHATE 10 MG/ML IJ SOLN
INTRAMUSCULAR | Status: AC
  Filled 2017-12-08: qty 1

## 2017-12-08 MED ORDER — CEFAZOLIN SODIUM-DEXTROSE 1-4 GM/50ML-% IV SOLN: 1.0000 g | INTRAVENOUS | Status: DC

## 2017-12-08 MED ORDER — HYDRALAZINE HCL 20 MG/ML IJ SOLN
INTRAMUSCULAR | Status: AC
  Filled 2017-12-08: qty 1

## 2017-12-08 MED ORDER — FENTANYL CITRATE (PF) 100 MCG/2ML IJ SOLN
INTRAMUSCULAR | Status: AC
  Filled 2017-12-08: qty 2

## 2017-12-08 MED ORDER — ONDANSETRON HCL 4 MG/2ML IJ SOLN
INTRAMUSCULAR | Status: DC | PRN
  Administered 2017-12-08: 4 mg via INTRAVENOUS

## 2017-12-08 MED ORDER — ONDANSETRON HCL 4 MG/2ML IJ SOLN: 4.0000 mg | Freq: Once | INTRAMUSCULAR | Status: DC | PRN

## 2017-12-08 MED ORDER — FENTANYL CITRATE (PF) 100 MCG/2ML IJ SOLN
INTRAMUSCULAR | Status: DC | PRN
  Administered 2017-12-08: 50 ug via INTRAVENOUS
  Administered 2017-12-08 (×2): 25 ug via INTRAVENOUS

## 2017-12-08 MED ORDER — EPHEDRINE SULFATE 50 MG/ML IJ SOLN
INTRAMUSCULAR | Status: DC | PRN
  Administered 2017-12-08 (×2): 5 mg via INTRAVENOUS

## 2017-12-08 MED ORDER — OXYCODONE HCL 5 MG PO TABS: 5.0000 mg | ORAL_TABLET | Freq: Once | ORAL | Status: DC | PRN

## 2017-12-08 MED ORDER — CEFAZOLIN SODIUM-DEXTROSE 1-4 GM/50ML-% IV SOLN
1.0000 g | INTRAVENOUS | Status: DC
  Filled 2017-12-08: qty 50

## 2017-12-08 MED ORDER — CALCITRIOL 0.25 MCG PO CAPS
0.2500 ug | ORAL_CAPSULE | ORAL | Status: DC
  Administered 2017-12-10: 0.25 ug via ORAL

## 2017-12-08 MED ORDER — PROPOFOL 10 MG/ML IV BOLUS
INTRAVENOUS | Status: DC | PRN
  Administered 2017-12-08: 20 mg via INTRAVENOUS
  Administered 2017-12-08: 100 mg via INTRAVENOUS

## 2017-12-08 MED ORDER — MIDAZOLAM HCL 2 MG/2ML IJ SOLN
INTRAMUSCULAR | Status: AC
  Filled 2017-12-08: qty 2

## 2017-12-08 SURGICAL SUPPLY — 17 items
BAG URO CATCHER STRL LF (MISCELLANEOUS) ×3 IMPLANT
BASKET ZERO TIP NITINOL 2.4FR (BASKET) IMPLANT
BSKT STON RTRVL ZERO TP 2.4FR (BASKET)
CATH INTERMIT  6FR 70CM (CATHETERS) ×2 IMPLANT
CLOTH BEACON ORANGE TIMEOUT ST (SAFETY) ×3 IMPLANT
COVER FOOTSWITCH UNIV (MISCELLANEOUS) IMPLANT
COVER SURGICAL LIGHT HANDLE (MISCELLANEOUS) ×1 IMPLANT
GLOVE BIOGEL M STRL SZ7.5 (GLOVE) ×5 IMPLANT
GOWN STRL REUS W/TWL LRG LVL3 (GOWN DISPOSABLE) ×3 IMPLANT
GOWN STRL REUS W/TWL XL LVL3 (GOWN DISPOSABLE) ×3 IMPLANT
GUIDEWIRE ANG ZIPWIRE 038X150 (WIRE) IMPLANT
GUIDEWIRE STR DUAL SENSOR (WIRE) ×3 IMPLANT
MANIFOLD NEPTUNE II (INSTRUMENTS) ×3 IMPLANT
PACK CYSTO (CUSTOM PROCEDURE TRAY) ×3 IMPLANT
STENT CONTOUR 6FRX26X.038 (STENTS) ×2 IMPLANT
TUBING CONNECTING 10 (TUBING) ×2 IMPLANT
TUBING CONNECTING 10' (TUBING) ×1

## 2017-12-08 NOTE — Transfer of Care (Signed)
Immediate Anesthesia Transfer of Care Note  Patient: Fernando Jones  Procedure(s) Performed: CYSTOSCOPY/RETROGRADE PYLEOGRAM LEFT URETEROSCOPY AND STONE EXTRACTION (Left ) HOLMIUM LASER APPLICATION (Left )  Patient Location: PACU  Anesthesia Type:General  Level of Consciousness: drowsy and patient cooperative  Airway & Oxygen Therapy: Patient Spontanous Breathing and Patient connected to face mask oxygen  Post-op Assessment: Report given to RN and Post -op Vital signs reviewed and stable  Post vital signs: Reviewed and stable  Last Vitals:  Vitals:   12/08/17 1600 12/08/17 1625  BP: (!) 180/89   Pulse: 78 77  Resp:    Temp:    SpO2: 97% 96%    Last Pain:  Vitals:   12/08/17 1625  TempSrc:   PainSc: Asleep      Patients Stated Pain Goal: 2 (94/17/40 8144)  Complications: No apparent anesthesia complications

## 2017-12-08 NOTE — Progress Notes (Addendum)
1838-Report called to [facility] room 17 to receiving nurse Benard Halsted [place] in to transport patient back to [facility]. Report given to Elige Radon RN for [employer]. Pt denies any pain at present. Glasses on patient, and discharged from PACU.

## 2017-12-08 NOTE — Progress Notes (Signed)
I spoke with Dr. Tyrell Antonio and Nephrology has cleared Fernando Jones for ureteroscopy.  He will be arranged to be transferred to [facility] for ureteroscopy by Dr. Junious Silk later today.

## 2017-12-08 NOTE — Plan of Care (Signed)
Progressing

## 2017-12-08 NOTE — Progress Notes (Signed)
BIlateral Upper Extremity Vein Map  Right Cephalic Segment Diameter Depth Comment  Axilla 3.9 mm 5.2 mm   1. prox upper 3.4 mm 7.2 mm   2. Mid upper arm 3.0 mm 3.8 mm   3. Above AC 2.4 mm 3.2 mm  branch  4. In AC 2.6 mm 3.0 mm   5. Below AC 3.0 mm 3.1 mm branch  6. Mid forearm 3.4 mm 3.3 mm   7. Dist FA 3.3 mm 2.3 mm   Wrist 2.7 mm 2.0 mm branch   Left Cephalic Segment Diameter Depth Comment  Axilla 4.8 mm 8.2 mm   1. prox upper 4.2 mm 5.1 mm   2. Mid upper arm mm mm Superficial vein thrombosis  3. Above AC mm mm IV bandage  4. In AC 2.7 mm 3.7 mm   5. Below AC 2.8 mm 3.6 mm   6. Mid forearm 3.4 mm 2.1 mm   7. Dist FA 2.5 mm 1.8 mm   Wrist mm mm    Left Basilic Segment Diameter Depth Comment  Axilla 4.9 mm 7.7 mm   1. prox upper 3.8 mm 8.2 mm   2. Mid upper arm 3.5 mm 6.5 mm   3. Above AC 3.2 mm 8.0 mm   4. In AC 3.2 mm 5.1 mm branch  5. Below AC 2.6 mm 3.8 mm   6. Mid forearm 2.8 mm 2.9 mm   7. Dist FA 2.7 mm 2.5 mm branch  Wrist 1.9 mm 2.2 mm    Lita Mains- RDMS, RVT 11:07 AM  12/08/2017

## 2017-12-08 NOTE — H&P (View-Only) (Signed)
   Daily Progress Note  Vein mapping appears compatible with attempt of R RC vs BC AVF placement tomorrow. Dr. Donnetta Hutching will be the surgeon for the procedure.  Adele Barthel, MD, FACS [facility] Office: [phone] Pager: [phone]  12/08/2017, 12:39 PM

## 2017-12-08 NOTE — Progress Notes (Signed)
[facility] Rounding Note  Background: 57 yo Hispanic male, known CKD 5 10/2016 2/2 poorly controlled HTN and DM, taking no meds. (Failed to f/u w/Dr. Marval Regal). Presents w/ESRD, metabolic acidosis, hypocalcemia and LUQ abd pain 2/2 obstructing left kidney stone.  Assessment/Recommendations 1. CKD 5 - New ESRD 1. D/t severe anemia, IR placed temp rather than TDC 2. For AVF 1/3 Dr. Donnetta Hutching (have requested tunnelling of HD cath while getting AVF) 3. CLIP pending 4. HD#2 today 2. Mild L hydro 2/2 stone. Still sig L sided pain. Dr. Jeffie Pollock plans ureteroscopy (I think today) so will delay HD until after. 2. Hypocalcemia/hyperphos (9.8)  - Started oral Ca carbonate with and in between meals. PTH 404. Start calcitriol w/HD 3. Gap Metabolic acidosis - improved post 1st HD 4. Anemia - Aranesp with HD. Not given yesterday as ordered. Resch for today. 4 dose Fe load. PRBC's 1/2 for Hb 6.9.  5. HTN - meds  Subjective/Interval History:  Temp cath placed by IR (rather than tunnelled d/t Hb 6.9) HD#1 yesterday well tolerated. Net UF 1 liter, 2.5 hour TMT, weight down 1 kg Still sig L side pain and says still with nausea I think for ureteroscopy this afternoon so next HD after [facility] access for tomorrow  Objective Vital signs in last 24 hours: Vitals:   12/08/17 0008 12/08/17 0123 12/08/17 0414 12/08/17 0818  BP: (!) 182/73 (!) 152/66 (!) 157/76 (!) 174/83  Pulse: 86 87 80 80  Resp: 18  18   Temp: 98.6 F (37 C)  98.9 F (37.2 C)   TempSrc: Oral  Oral   SpO2: 98%  96%   Weight:       Weight change: 6.332 kg (13 lb 15.4 oz)  Intake/Output Summary (Last 24 hours) at 12/08/2017 1250 Last data filed at 12/08/2017 0949 Gross per 24 hour  Intake 1179 ml  Output 1662 ml  Net -483 ml   Physical Exam:  Blood pressure (!) 174/83, pulse 80, temperature 98.9 F (37.2 C), temperature source Oral, resp. rate 18, weight 64.3 kg (141 lb 12.1 oz), SpO2 96 %.   Small framed Hispanic man, chronically ill app Basilar  crackles only, anteriorly clear No pericardial rub S1S2 No S3 Soft 1/6 systolic murmur ULSB No diastolic murmur Abd soft, NT but points to LUQ site of pain 2+ LE pitting edema   Recent Labs  Lab 12/06/17 1348 12/06/17 2102 12/07/17 0345 12/07/17 0920 12/08/17 0419  NA 135  --  133* 134* 136  K 5.0  --  5.0 4.7 4.0  CL 106  --  107 109 105  CO2 12*  --  10* 11* 18*  GLUCOSE 93  --  68 83 78  BUN 136*  --  133* 133* 80*  CREATININE 15.16* 15.61* 15.80* 15.89* 11.15*  CALCIUM 6.1*  --  6.2* 6.2* 6.5*  PHOS  --   --  9.8*  --   --     Recent Labs  Lab 12/06/17 1348 12/07/17 0345 12/07/17 0920 12/07/17 2100 12/08/17 0419  AST 9*  --  7*  --  10*  ALT 10*  --  8* 8* 9*  ALKPHOS 65  --  51  --  55  BILITOT 0.5  --  0.4  --  0.5  PROT 6.8  --  5.0*  --  5.2*  ALBUMIN 3.4* 2.7* 2.5*  --  2.5*   Recent Labs  Lab 12/06/17 1348  LIPASE 40    Recent Labs  Lab 12/06/17  1348 12/06/17 2102 12/07/17 0349 12/08/17 0419  WBC 7.8 8.7 6.7 6.0  HGB 7.9* 7.6* 6.9* 8.1*  HCT 23.6* 22.3* 19.9* 23.7*  MCV 88.1 87.5 87.7 87.8  PLT 247 252 212 211    Recent Labs  Lab 12/06/17 1348 12/06/17 2102 12/07/17 0349 12/07/17 0920  TROPONINI 0.07* 0.05* 0.06* 0.07*   CBG: Recent Labs  Lab 12/08/17 0004 12/08/17 0121 12/08/17 0412 12/08/17 0759 12/08/17 1210  GLUCAP 65 138* 87 78 125*    Iron Studies:  Recent Labs  Lab 12/07/17 0349  IRON 39*  TIBC 178*  FERRITIN 189   Studies/Results: Ct Abdomen Pelvis Wo Contrast  Result Date: 12/06/2017 CLINICAL DATA:  Left lower quadrant pain. Pertinent pressure for 7 days. Nausea vomiting and fatigue for 7 days. Diverticulitis suspected. EXAM: CT ABDOMEN AND PELVIS WITHOUT CONTRAST TECHNIQUE: Multidetector CT imaging of the abdomen and pelvis was performed following the standard protocol without IV contrast. COMPARISON:  10/13/2016 renal ultrasound.  No prior CT. FINDINGS: Lower chest: Clear lung bases. Small right larger than  left pleural effusions. Borderline cardiomegaly. Hepatobiliary: Normal liver. Normal gallbladder, without biliary ductal dilatation. Pancreas: Normal, without mass or ductal dilatation. Spleen: Normal in size, without focal abnormality. Adrenals/Urinary Tract: Normal adrenal glands. Bilateral renal cortical thinning. Bilateral renal vascular calcifications. Possible concurrent bilateral collecting system calculi. Mild left-sided hydroureteronephrosis, followed to the level of a distal left ureteric 4 mm stone on image 66/series 2. No bladder calculi. Stomach/Bowel: Proximal gastric underdistention. Sigmoid colon diverticulosis. Ascending colonic underdistention. Apparent wall thickening is at least partially felt to be secondary. Example image 41/series 2. Normal terminal ileum. Normal small bowel. Vascular/Lymphatic: Aortic and branch vessel atherosclerosis. No abdominopelvic adenopathy. Reproductive: Mild prostatomegaly. Other: Trace cul-de-sac fluid.  Anasarca. Musculoskeletal: Degenerative disc disease at the lumbosacral junction. IMPRESSION: 1. Mild left-sided urinary tract obstruction secondary to a distal left ureteric calculus. 2. Ascending colonic underdistention. Apparent wall thickening is at least partially secondary. Cannot exclude concurrent colitis. 3. Bilateral pleural effusions and small volume pelvic fluid. Question fluid overload. 4.  Aortic Atherosclerosis (ICD10-I70.0). 5. Limited exam secondary to lack of oral or IV contrast. Electronically Signed   By: Abigail Miyamoto M.D.   On: 12/06/2017 16:12   Dg Chest 2 View  Result Date: 12/06/2017 CLINICAL DATA:  Left upper quadrant pain and pressure for 7 days EXAM: CHEST  2 VIEW COMPARISON:  October 12, 2016 FINDINGS: The heart size and mediastinal contours are within normal limits. Mild central pulmonary vascular congestion is identified. No frank pulmonary edema. Minimal left pleural effusion noted. No focal pneumonia is identified. The visualized  skeletal structures are stable. IMPRESSION: Mild central pulmonary vascular congestion without frank pulmonary edema. Minimal left pleural effusion. Electronically Signed   By: Abelardo Diesel M.D.   On: 12/06/2017 16:19   Ir Fluoro Guide Cv Line Right  Result Date: 12/07/2017 INDICATION: End-stage renal disease, currently admitted with anemia and an elevated troponin. Request made for placement of a temporary dialysis catheter for the initiation of dialysis. EXAM: NON-TUNNELED CENTRAL VENOUS HEMODIALYSIS CATHETER PLACEMENT WITH ULTRASOUND AND FLUOROSCOPIC GUIDANCE COMPARISON:  None. MEDICATIONS: None FLUOROSCOPY TIME:  12 seconds (2 mGy) COMPLICATIONS: None immediate. PROCEDURE: Informed written consent was obtained from the patient after a discussion of the risks, benefits, and alternatives to treatment. Questions regarding the procedure were encouraged and answered. The right neck and chest were prepped with chlorhexidine in a sterile fashion, and a sterile drape was applied covering the operative field. Maximum barrier sterile technique with sterile  gowns and gloves were used for the procedure. A timeout was performed prior to the initiation of the procedure. After the overlying soft tissues were anesthetized, a small venotomy incision was created and a micropuncture kit was utilized to access the internal jugular vein. Real-time ultrasound guidance was utilized for vascular access including the acquisition of a permanent ultrasound image documenting patency of the accessed vessel. The microwire was utilized to measure appropriate catheter length. A stiff glidewire was advanced to the level of the IVC. Under fluoroscopic guidance, the venotomy was serially dilated, ultimately allowing placement of a 20 cm temporary Mahurkar catheter with tip ultimately terminating within the superior aspect of the right atrium. Final catheter positioning was confirmed and documented with a spot radiographic image. The catheter  aspirates and flushes normally. The catheter was flushed with appropriate volume heparin dwells. The catheter exit site was secured with a 0-Prolene retention suture. A dressing was placed. The patient tolerated the procedure well without immediate post procedural complication. IMPRESSION: Successful placement of a right internal jugular approach 20 cm temporary dialysis catheter with tip terminating with in the superior aspect of the right atrium. The catheter is ready for immediate use. PLAN: This catheter may be converted to a tunneled dialysis catheter at a later date as indicated. Electronically Signed   By: Sandi Mariscal M.D.   On: 12/07/2017 11:46   Ir US Guide Vasc Access Right  Result Date: 12/07/2017 INDICATION: End-stage renal disease, currently admitted with anemia and an elevated troponin. Request made for placement of a temporary dialysis catheter for the initiation of dialysis. EXAM: NON-TUNNELED CENTRAL VENOUS HEMODIALYSIS CATHETER PLACEMENT WITH ULTRASOUND AND FLUOROSCOPIC GUIDANCE COMPARISON:  None. MEDICATIONS: None FLUOROSCOPY TIME:  12 seconds (2 mGy) COMPLICATIONS: None immediate. PROCEDURE: Informed written consent was obtained from the patient after a discussion of the risks, benefits, and alternatives to treatment. Questions regarding the procedure were encouraged and answered. The right neck and chest were prepped with chlorhexidine in a sterile fashion, and a sterile drape was applied covering the operative field. Maximum barrier sterile technique with sterile gowns and gloves were used for the procedure. A timeout was performed prior to the initiation of the procedure. After the overlying soft tissues were anesthetized, a small venotomy incision was created and a micropuncture kit was utilized to access the internal jugular vein. Real-time ultrasound guidance was utilized for vascular access including the acquisition of a permanent ultrasound image documenting patency of the accessed  vessel. The microwire was utilized to measure appropriate catheter length. A stiff glidewire was advanced to the level of the IVC. Under fluoroscopic guidance, the venotomy was serially dilated, ultimately allowing placement of a 20 cm temporary Mahurkar catheter with tip ultimately terminating within the superior aspect of the right atrium. Final catheter positioning was confirmed and documented with a spot radiographic image. The catheter aspirates and flushes normally. The catheter was flushed with appropriate volume heparin dwells. The catheter exit site was secured with a 0-Prolene retention suture. A dressing was placed. The patient tolerated the procedure well without immediate post procedural complication. IMPRESSION: Successful placement of a right internal jugular approach 20 cm temporary dialysis catheter with tip terminating with in the superior aspect of the right atrium. The catheter is ready for immediate use. PLAN: This catheter may be converted to a tunneled dialysis catheter at a later date as indicated. Electronically Signed   By: Sandi Mariscal M.D.   On: 12/07/2017 11:46   Medications: . sodium chloride    .  sodium chloride    . [START ON 12/09/2017]  ceFAZolin (ANCEF) IV    . ferric gluconate (FERRLECIT/NULECIT) IV Stopped (12/07/17 1641)   . aspirin EC  81 mg Oral Daily  . calcium carbonate  4 tablet Oral TID WC  . calcium carbonate  4 tablet Oral TID  . carvedilol  12.5 mg Oral BID WC  . darbepoetin (ARANESP) injection - DIALYSIS  200 mcg Intravenous Q Thu-HD  . heparin  5,000 Units Subcutaneous Q8H  . hydrALAZINE  25 mg Oral Q8H  . promethazine  25 mg Intravenous Once  . sodium chloride flush  3 mL Intravenous Q12H    Jamal Maes, MD [facility] [phone] pager 12/08/2017, 12:50 PM

## 2017-12-08 NOTE — Telephone Encounter (Signed)
Phone call from Dr. Lorrene Reid requesting that [facility] be added to patient's surgery for 12/09/17. Dr. Donnetta Hutching approved procedure added to booking and PA"S notified via page.

## 2017-12-08 NOTE — Anesthesia Preprocedure Evaluation (Signed)
Anesthesia Evaluation  Patient identified by MRN, date of birth, ID band Patient awake    Reviewed: Allergy & Precautions, NPO status , Patient's Chart, lab work & pertinent test results  Airway Mallampati: II  TM Distance: >3 FB Neck ROM: Full    Dental  (+) Teeth Intact, Dental Advisory Given   Pulmonary former smoker,    breath sounds clear to auscultation       Cardiovascular hypertension,  Rhythm:Regular Rate:Normal     Neuro/Psych    GI/Hepatic   Endo/Other  diabetes  Renal/GU      Musculoskeletal   Abdominal   Peds  Hematology   Anesthesia Other Findings   Reproductive/Obstetrics                             Anesthesia Physical Anesthesia Plan  ASA: III  Anesthesia Plan: General   Post-op Pain Management:    Induction: Intravenous  PONV Risk Score and Plan: Ondansetron and Metaclopromide  Airway Management Planned: Oral ETT  Additional Equipment:   Intra-op Plan:   Post-operative Plan: Extubation in OR  Informed Consent: I have reviewed the patients History and Physical, chart, labs and discussed the procedure including the risks, benefits and alternatives for the proposed anesthesia with the patient or authorized representative who has indicated his/her understanding and acceptance.   Dental advisory given  Plan Discussed with: CRNA and Anesthesiologist  Anesthesia Plan Comments:         Anesthesia Quick Evaluation

## 2017-12-08 NOTE — Progress Notes (Signed)
Dr Junious Silk said patient is to transfer back to [facility] via [facility]. [facility] notified of transfer.

## 2017-12-08 NOTE — Anesthesia Procedure Notes (Signed)
Procedure Name: Intubation Date/Time: 12/08/2017 5:11 PM Performed by: West Pugh, CRNA Pre-anesthesia Checklist: Patient identified, Emergency Drugs available, Suction available, Patient being monitored and Timeout performed Patient Re-evaluated:Patient Re-evaluated prior to induction Oxygen Delivery Method: Circle system utilized Preoxygenation: Pre-oxygenation with 100% oxygen Induction Type: IV induction Ventilation: Mask ventilation without difficulty Laryngoscope Size: Mac and 3 Grade View: Grade II Tube type: Oral Tube size: 7.0 mm Number of attempts: 1 Airway Equipment and Method: Stylet (Cricoid pressure applied) Placement Confirmation: ETT inserted through vocal cords under direct vision,  positive ETCO2 and breath sounds checked- equal and bilateral Secured at: 22 cm Tube secured with: Tape Dental Injury: Teeth and Oropharynx as per pre-operative assessment

## 2017-12-08 NOTE — Progress Notes (Signed)
PROGRESS NOTE  Fernando Jones WUJ:[id number] DOB: 03/06/1961 DOA: 12/06/2017 PCP: Patient, No Pcp Per   LOS: 2 days   Brief Narrative / Interim history: 57 y.o. Hispanic male, with history of diabetes mellitus, hypertension, CKD V, who came to hospital with abdominal pain with nausea and vomiting. CT showed mild left sided hydronephrosis and distal ureteral calculus, possible ascending colon wall thickening, bilateral pleural effusions. Labs showed BUN 36, creatinine 15.16.  Nephrology and urology were consulted.  He reports that patient has been noncompliant with his medications for his hypertension as well as diabetes.  Assessment & Plan: Active Problems:   DM (diabetes mellitus), type 2 with renal complications (HCC)   Essential hypertension   ESRD (end stage renal disease) ([place])  End-stage renal disease -Patient's previous creatinine from 2017 was around 5, on admission 15.17.  He has been noncompliant with his home medications.  BUN is 136, and seems to have symptomatic uremic symptoms with transient episodes of confusion. -Nephrology consulted, patient had dialysis catheter placed. He underwent HD 1-02. -will need Clip and permanent access for HD.   Hypertension -Continue home medications with Coreg and hydralazine.    Anemia; iron deficiency.  Needs out patient colonoscopy.  Needs iron at discharge   Left ureteral stone/hydronephrosis:  -Urology consulted. Discussed with Dr Lorrene Reid ok to proceed with cystoscopy today,.  - Elevated troponin -0.05>> 0.06>> 0.07, overall flat, not in a pattern consistent with ACS -No chest pain  History of diabetes mellitus -Last hemoglobin A1c from 2017 was 5.3.  Repeat hemoglobin A1c now 4.6, this is to be under control, question whether he truly had diabetes in the past or not.  Normocytic anemia likely in the setting of end-stage renal disease -No evidence of bleeding, transfuse 1 unit of packed red blood cells and repeat hemoglobin  after wants  ?  Colitis -CT scan concerning for thickening of the ascending colon, however patient is asymptomatic -Denies abdominal pain, denies diarrhea.   DVT prophylaxis: heparin Code Status: Full code Family Communication: no family at bedside Disposition Plan: home when ready   Consultants:   Nephrology  Urology  Procedures:   Tunneled HD cath placement 1/2  Antimicrobials:  None  Subjective: -he is still complaining of flank pain, nausea.  Not eating a lot.   Objective: Vitals:   12/08/17 0008 12/08/17 0123 12/08/17 0414 12/08/17 0818  BP: (!) 182/73 (!) 152/66 (!) 157/76 (!) 174/83  Pulse: 86 87 80 80  Resp: 18  18   Temp: 98.6 F (37 C)  98.9 F (37.2 C)   TempSrc: Oral  Oral   SpO2: 98%  96%   Weight:        Intake/Output Summary (Last 24 hours) at 12/08/2017 1222 Last data filed at 12/08/2017 0949 Gross per 24 hour  Intake 1179 ml  Output 1662 ml  Net -483 ml   Filed Weights   12/06/17 1315 12/07/17 2024 12/07/17 2322  Weight: 59 kg (130 lb) 65.3 kg (143 lb 15.4 oz) 64.3 kg (141 lb 12.1 oz)    Examination:  Constitutional: No acute distress.  ENMT: MMM Respiratory: CTA Cardiovascular: S 1, S 2 RRR Abdomen: BS present, soft, nt Skin: no rashes Neurologic: non focal.  Psychiatric: non focal.    Data Reviewed: I have independently reviewed following labs and imaging studies   CBC: Recent Labs  Lab 12/06/17 1348 12/06/17 2102 12/07/17 0349 12/08/17 0419  WBC 7.8 8.7 6.7 6.0  HGB 7.9* 7.6* 6.9* 8.1*  HCT 23.6*  22.3* 19.9* 23.7*  MCV 88.1 87.5 87.7 87.8  PLT 247 252 212 161   Basic Metabolic Panel: Recent Labs  Lab 12/06/17 1348 12/06/17 2102 12/07/17 0345 12/07/17 0920 12/08/17 0419  NA 135  --  133* 134* 136  K 5.0  --  5.0 4.7 4.0  CL 106  --  107 109 105  CO2 12*  --  10* 11* 18*  GLUCOSE 93  --  68 83 78  BUN 136*  --  133* 133* 80*  CREATININE 15.16* 15.61* 15.80* 15.89* 11.15*  CALCIUM 6.1*  --  6.2* 6.2* 6.5*    MG  --   --   --   --  1.8  PHOS  --   --  9.8*  --   --    GFR: CrCl cannot be calculated (Unknown ideal weight.). Liver Function Tests: Recent Labs  Lab 12/06/17 1348 12/07/17 0345 12/07/17 0920 12/07/17 2100 12/08/17 0419  AST 9*  --  7*  --  10*  ALT 10*  --  8* 8* 9*  ALKPHOS 65  --  51  --  55  BILITOT 0.5  --  0.4  --  0.5  PROT 6.8  --  5.0*  --  5.2*  ALBUMIN 3.4* 2.7* 2.5*  --  2.5*   Recent Labs  Lab 12/06/17 1348  LIPASE 40   No results for input(s): AMMONIA in the last 168 hours. Coagulation Profile: Recent Labs  Lab 12/07/17 0349  INR 1.25   Cardiac Enzymes: Recent Labs  Lab 12/06/17 1348 12/06/17 2102 12/07/17 0349 12/07/17 0920  TROPONINI 0.07* 0.05* 0.06* 0.07*   BNP (last 3 results) No results for input(s): PROBNP in the last 8760 hours. HbA1C: Recent Labs    12/06/17 2102  HGBA1C 4.6*   CBG: Recent Labs  Lab 12/08/17 0004 12/08/17 0121 12/08/17 0412 12/08/17 0759 12/08/17 1210  GLUCAP 65 138* 87 78 125*   Lipid Profile: No results for input(s): CHOL, HDL, LDLCALC, TRIG, CHOLHDL, LDLDIRECT in the last 72 hours. Thyroid Function Tests: No results for input(s): TSH, T4TOTAL, FREET4, T3FREE, THYROIDAB in the last 72 hours. Anemia Panel: Recent Labs    12/07/17 0349  FERRITIN 189  TIBC 178*  IRON 39*   Urine analysis:    Component Value Date/Time   COLORURINE YELLOW 12/06/2017 1339   APPEARANCEUR CLEAR 12/06/2017 1339   LABSPEC 1.011 12/06/2017 1339   PHURINE 5.0 12/06/2017 1339   GLUCOSEU 50 (A) 12/06/2017 1339   HGBUR MODERATE (A) 12/06/2017 1339   BILIRUBINUR NEGATIVE 12/06/2017 1339   KETONESUR NEGATIVE 12/06/2017 1339   PROTEINUR >=300 (A) 12/06/2017 1339   NITRITE NEGATIVE 12/06/2017 1339   LEUKOCYTESUR NEGATIVE 12/06/2017 1339   Sepsis Labs: Invalid input(s): PROCALCITONIN, LACTICIDVEN  No results found for this or any previous visit (from the past 240 hour(s)).    Radiology Studies: Ct Abdomen Pelvis  Wo Contrast  Result Date: 12/06/2017 CLINICAL DATA:  Left lower quadrant pain. Pertinent pressure for 7 days. Nausea vomiting and fatigue for 7 days. Diverticulitis suspected. EXAM: CT ABDOMEN AND PELVIS WITHOUT CONTRAST TECHNIQUE: Multidetector CT imaging of the abdomen and pelvis was performed following the standard protocol without IV contrast. COMPARISON:  10/13/2016 renal ultrasound.  No prior CT. FINDINGS: Lower chest: Clear lung bases. Small right larger than left pleural effusions. Borderline cardiomegaly. Hepatobiliary: Normal liver. Normal gallbladder, without biliary ductal dilatation. Pancreas: Normal, without mass or ductal dilatation. Spleen: Normal in size, without focal abnormality. Adrenals/Urinary Tract: Normal adrenal  glands. Bilateral renal cortical thinning. Bilateral renal vascular calcifications. Possible concurrent bilateral collecting system calculi. Mild left-sided hydroureteronephrosis, followed to the level of a distal left ureteric 4 mm stone on image 66/series 2. No bladder calculi. Stomach/Bowel: Proximal gastric underdistention. Sigmoid colon diverticulosis. Ascending colonic underdistention. Apparent wall thickening is at least partially felt to be secondary. Example image 41/series 2. Normal terminal ileum. Normal small bowel. Vascular/Lymphatic: Aortic and branch vessel atherosclerosis. No abdominopelvic adenopathy. Reproductive: Mild prostatomegaly. Other: Trace cul-de-sac fluid.  Anasarca. Musculoskeletal: Degenerative disc disease at the lumbosacral junction. IMPRESSION: 1. Mild left-sided urinary tract obstruction secondary to a distal left ureteric calculus. 2. Ascending colonic underdistention. Apparent wall thickening is at least partially secondary. Cannot exclude concurrent colitis. 3. Bilateral pleural effusions and small volume pelvic fluid. Question fluid overload. 4.  Aortic Atherosclerosis (ICD10-I70.0). 5. Limited exam secondary to lack of oral or IV contrast.  Electronically Signed   By: Abigail Miyamoto M.D.   On: 12/06/2017 16:12   Dg Chest 2 View  Result Date: 12/06/2017 CLINICAL DATA:  Left upper quadrant pain and pressure for 7 days EXAM: CHEST  2 VIEW COMPARISON:  October 12, 2016 FINDINGS: The heart size and mediastinal contours are within normal limits. Mild central pulmonary vascular congestion is identified. No frank pulmonary edema. Minimal left pleural effusion noted. No focal pneumonia is identified. The visualized skeletal structures are stable. IMPRESSION: Mild central pulmonary vascular congestion without frank pulmonary edema. Minimal left pleural effusion. Electronically Signed   By: Abelardo Diesel M.D.   On: 12/06/2017 16:19   Ir Fluoro Guide Cv Line Right  Result Date: 12/07/2017 INDICATION: End-stage renal disease, currently admitted with anemia and an elevated troponin. Request made for placement of a temporary dialysis catheter for the initiation of dialysis. EXAM: NON-TUNNELED CENTRAL VENOUS HEMODIALYSIS CATHETER PLACEMENT WITH ULTRASOUND AND FLUOROSCOPIC GUIDANCE COMPARISON:  None. MEDICATIONS: None FLUOROSCOPY TIME:  12 seconds (2 mGy) COMPLICATIONS: None immediate. PROCEDURE: Informed written consent was obtained from the patient after a discussion of the risks, benefits, and alternatives to treatment. Questions regarding the procedure were encouraged and answered. The right neck and chest were prepped with chlorhexidine in a sterile fashion, and a sterile drape was applied covering the operative field. Maximum barrier sterile technique with sterile gowns and gloves were used for the procedure. A timeout was performed prior to the initiation of the procedure. After the overlying soft tissues were anesthetized, a small venotomy incision was created and a micropuncture kit was utilized to access the internal jugular vein. Real-time ultrasound guidance was utilized for vascular access including the acquisition of a permanent ultrasound image  documenting patency of the accessed vessel. The microwire was utilized to measure appropriate catheter length. A stiff glidewire was advanced to the level of the IVC. Under fluoroscopic guidance, the venotomy was serially dilated, ultimately allowing placement of a 20 cm temporary Mahurkar catheter with tip ultimately terminating within the superior aspect of the right atrium. Final catheter positioning was confirmed and documented with a spot radiographic image. The catheter aspirates and flushes normally. The catheter was flushed with appropriate volume heparin dwells. The catheter exit site was secured with a 0-Prolene retention suture. A dressing was placed. The patient tolerated the procedure well without immediate post procedural complication. IMPRESSION: Successful placement of a right internal jugular approach 20 cm temporary dialysis catheter with tip terminating with in the superior aspect of the right atrium. The catheter is ready for immediate use. PLAN: This catheter may be converted to a tunneled dialysis  catheter at a later date as indicated. Electronically Signed   By: Sandi Mariscal M.D.   On: 12/07/2017 11:46   Ir US Guide Vasc Access Right  Result Date: 12/07/2017 INDICATION: End-stage renal disease, currently admitted with anemia and an elevated troponin. Request made for placement of a temporary dialysis catheter for the initiation of dialysis. EXAM: NON-TUNNELED CENTRAL VENOUS HEMODIALYSIS CATHETER PLACEMENT WITH ULTRASOUND AND FLUOROSCOPIC GUIDANCE COMPARISON:  None. MEDICATIONS: None FLUOROSCOPY TIME:  12 seconds (2 mGy) COMPLICATIONS: None immediate. PROCEDURE: Informed written consent was obtained from the patient after a discussion of the risks, benefits, and alternatives to treatment. Questions regarding the procedure were encouraged and answered. The right neck and chest were prepped with chlorhexidine in a sterile fashion, and a sterile drape was applied covering the operative field.  Maximum barrier sterile technique with sterile gowns and gloves were used for the procedure. A timeout was performed prior to the initiation of the procedure. After the overlying soft tissues were anesthetized, a small venotomy incision was created and a micropuncture kit was utilized to access the internal jugular vein. Real-time ultrasound guidance was utilized for vascular access including the acquisition of a permanent ultrasound image documenting patency of the accessed vessel. The microwire was utilized to measure appropriate catheter length. A stiff glidewire was advanced to the level of the IVC. Under fluoroscopic guidance, the venotomy was serially dilated, ultimately allowing placement of a 20 cm temporary Mahurkar catheter with tip ultimately terminating within the superior aspect of the right atrium. Final catheter positioning was confirmed and documented with a spot radiographic image. The catheter aspirates and flushes normally. The catheter was flushed with appropriate volume heparin dwells. The catheter exit site was secured with a 0-Prolene retention suture. A dressing was placed. The patient tolerated the procedure well without immediate post procedural complication. IMPRESSION: Successful placement of a right internal jugular approach 20 cm temporary dialysis catheter with tip terminating with in the superior aspect of the right atrium. The catheter is ready for immediate use. PLAN: This catheter may be converted to a tunneled dialysis catheter at a later date as indicated. Electronically Signed   By: Sandi Mariscal M.D.   On: 12/07/2017 11:46     Scheduled Meds: . aspirin EC  81 mg Oral Daily  . calcium carbonate  4 tablet Oral TID WC  . calcium carbonate  4 tablet Oral TID  . carvedilol  12.5 mg Oral BID WC  . [START ON 12/14/2017] darbepoetin (ARANESP) injection - DIALYSIS  200 mcg Intravenous Q Wed-HD  . heparin  5,000 Units Subcutaneous Q8H  . hydrALAZINE  25 mg Oral Q8H  .  promethazine  25 mg Intravenous Once  . sodium chloride flush  3 mL Intravenous Q12H   Continuous Infusions: . sodium chloride    . sodium chloride    . [START ON 12/09/2017]  ceFAZolin (ANCEF) IV    . ferric gluconate (FERRLECIT/NULECIT) IV Stopped (12/07/17 1641)    Niel Hummer, MD [facility] Pager [phone]  If 7PM-7AM, please contact night-coverage [email or web address] Password [facility] 12/08/2017, 12:22 PM

## 2017-12-08 NOTE — Progress Notes (Signed)
   Daily Progress Note  Vein mapping appears compatible with attempt of R RC vs BC AVF placement tomorrow. Dr. Donnetta Hutching will be the surgeon for the procedure.  Adele Barthel, MD, FACS [facility] Office: [phone] Pager: [phone]  12/08/2017, 12:39 PM

## 2017-12-08 NOTE — Progress Notes (Signed)
Patient seen and examined in the preop area.  I reviewed his chart, labs, images of his CT and discussed the patient with Dr. Jeffie Pollock. I discussed with the patient the nature, potential benefits, risks and alternatives to cystoscopy, left retrograde pyelogram, left ureteroscopy, laser lithotripsy and stent placement, including side effects of the proposed treatment, the likelihood of the patient achieving the goals of the procedure, and any potential problems that might occur during the procedure or recuperation.  Discussed he may have passed the stone.  Patient has not seen the stone pass and he continues to have left flank pain.  All questions answered. Patient elects to proceed.  He and I had a good conversation and answered all his questions.  We had the interpreter/monitor available at the bedside and offered it but he declined.

## 2017-12-08 NOTE — Progress Notes (Signed)
Subjective: Mr. Mizrahi continues to have left flank and abdominal pain with nausea.   ROS:  Review of Systems  Constitutional: Negative for fever.  Respiratory: Negative for shortness of breath.     Anti-infectives: Anti-infectives (From admission, onward)   None      Current Facility-Administered Medications  Medication Dose Route Frequency Provider Last Rate Last Dose  . 0.9 %  sodium chloride infusion  250 mL Intravenous PRN [place], Gagan S, MD      . 0.9 %  sodium chloride infusion   Intravenous Once Schorr, Rhetta Mura, NP      . acetaminophen (TYLENOL) tablet 650 mg  650 mg Oral Q6H PRN Oswald Hillock, MD   650 mg at 12/08/17 0016   Or  . acetaminophen (TYLENOL) suppository 650 mg  650 mg Rectal Q6H PRN Oswald Hillock, MD      . aspirin EC tablet 81 mg  81 mg Oral Daily Oswald Hillock, MD   81 mg at 12/07/17 0930  . calcium carbonate (TUMS - dosed in mg elemental calcium) chewable tablet 800 mg of elemental calcium  4 tablet Oral TID WC Jamal Maes, MD   800 mg of elemental calcium at 12/07/17 1703  . calcium carbonate (TUMS - dosed in mg elemental calcium) chewable tablet 800 mg of elemental calcium  4 tablet Oral TID Jamal Maes, MD   800 mg of elemental calcium at 12/08/17 0016  . carvedilol (COREG) tablet 12.5 mg  12.5 mg Oral BID WC Oswald Hillock, MD   12.5 mg at 12/07/17 1704  . [START ON 12/14/2017] Darbepoetin Alfa (ARANESP) injection 200 mcg  200 mcg Intravenous Q Wed-HD Jamal Maes, MD      . ferric gluconate (NULECIT) 125 mg in sodium chloride 0.9 % 100 mL IVPB  125 mg Intravenous Daily Jamal Maes, MD   Stopped at 12/07/17 1641  . heparin injection 1,000 Units  1,000 Units Dialysis PRN Jamal Maes, MD   1,000 Units at 12/07/17 2320  . heparin injection 5,000 Units  5,000 Units Subcutaneous Q8H Monia Sabal, PA-C   5,000 Units at 12/08/17 0510  . heparin injection   Intravenous PRN Sandi Mariscal, MD   2,800 Units at 12/07/17 1125  . hydrALAZINE  (APRESOLINE) injection 10 mg  10 mg Intravenous Q4H PRN Oswald Hillock, MD   10 mg at 12/07/17 0028  . hydrALAZINE (APRESOLINE) tablet 25 mg  25 mg Oral Q8H Oswald Hillock, MD   25 mg at 12/08/17 0509  . HYDROmorphone (DILAUDID) injection 0.5-1 mg  0.5-1 mg Intravenous Q4H PRN Caren Griffins, MD   0.5 mg at 12/07/17 1704  . lidocaine (PF) (XYLOCAINE) 1 % injection    PRN Sandi Mariscal, MD   2 mL at 12/07/17 1121  . ondansetron (ZOFRAN) tablet 4 mg  4 mg Oral Q6H PRN Oswald Hillock, MD       Or  . ondansetron [facility]) injection 4 mg  4 mg Intravenous Q6H PRN Oswald Hillock, MD   4 mg at 12/07/17 5625  . promethazine (PHENERGAN) injection 25 mg  25 mg Intravenous Once Sandi Mariscal, MD      . sodium chloride flush (NS) 0.9 % injection 3 mL  3 mL Intravenous Q12H Oswald Hillock, MD   3 mL at 12/08/17 0019  . sodium chloride flush (NS) 0.9 % injection 3 mL  3 mL Intravenous PRN Oswald Hillock, MD  Objective: Vital signs in last 24 hours: Temp:  [98.3 F (36.8 C)-99 F (37.2 C)] 98.9 F (37.2 C) (01/03 0414) Pulse Rate:  [72-87] 80 (01/03 0414) Resp:  [11-20] 18 (01/03 0414) BP: (129-182)/(56-89) 157/76 (01/03 0414) SpO2:  [96 %-100 %] 96 % (01/03 0414) Weight:  [64.3 kg (141 lb 12.1 oz)-65.3 kg (143 lb 15.4 oz)] 64.3 kg (141 lb 12.1 oz) (01/02 2322)  Intake/Output from previous day: 01/02 0701 - 01/03 0700 In: 946 [P.O.:458; I.V.:33; Blood:345; IV Piggyback:110] Out: 1462 [Urine:450] Intake/Output this shift: Total I/O In: 236 [P.O.:236] Out: 1462 [Urine:450; Other:1012]   Physical Exam  Constitutional: He is oriented to person, place, and time and well-developed, well-nourished, and in no distress.  Abdominal: Soft. There is tenderness (LLQ and flank).  Neurological: He is alert and oriented to person, place, and time.  Vitals reviewed.   Lab Results:  Recent Labs    12/07/17 0349 12/08/17 0419  WBC 6.7 6.0  HGB 6.9* 8.1*  HCT 19.9* 23.7*  PLT 212 211   BMET Recent  Labs    12/07/17 0345 12/07/17 0920  NA 133* 134*  K 5.0 4.7  CL 107 109  CO2 10* 11*  GLUCOSE 68 83  BUN 133* 133*  CREATININE 15.80* 15.89*  CALCIUM 6.2* 6.2*   PT/INR Recent Labs    12/07/17 0349  LABPROT 15.6*  INR 1.25   ABG No results for input(s): PHART, HCO3 in the last 72 hours.  Invalid input(s): PCO2, PO2  Studies/Results: Ct Abdomen Pelvis Wo Contrast  Result Date: 12/06/2017 CLINICAL DATA:  Left lower quadrant pain. Pertinent pressure for 7 days. Nausea vomiting and fatigue for 7 days. Diverticulitis suspected. EXAM: CT ABDOMEN AND PELVIS WITHOUT CONTRAST TECHNIQUE: Multidetector CT imaging of the abdomen and pelvis was performed following the standard protocol without IV contrast. COMPARISON:  10/13/2016 renal ultrasound.  No prior CT. FINDINGS: Lower chest: Clear lung bases. Small right larger than left pleural effusions. Borderline cardiomegaly. Hepatobiliary: Normal liver. Normal gallbladder, without biliary ductal dilatation. Pancreas: Normal, without mass or ductal dilatation. Spleen: Normal in size, without focal abnormality. Adrenals/Urinary Tract: Normal adrenal glands. Bilateral renal cortical thinning. Bilateral renal vascular calcifications. Possible concurrent bilateral collecting system calculi. Mild left-sided hydroureteronephrosis, followed to the level of a distal left ureteric 4 mm stone on image 66/series 2. No bladder calculi. Stomach/Bowel: Proximal gastric underdistention. Sigmoid colon diverticulosis. Ascending colonic underdistention. Apparent wall thickening is at least partially felt to be secondary. Example image 41/series 2. Normal terminal ileum. Normal small bowel. Vascular/Lymphatic: Aortic and branch vessel atherosclerosis. No abdominopelvic adenopathy. Reproductive: Mild prostatomegaly. Other: Trace cul-de-sac fluid.  Anasarca. Musculoskeletal: Degenerative disc disease at the lumbosacral junction. IMPRESSION: 1. Mild left-sided urinary tract  obstruction secondary to a distal left ureteric calculus. 2. Ascending colonic underdistention. Apparent wall thickening is at least partially secondary. Cannot exclude concurrent colitis. 3. Bilateral pleural effusions and small volume pelvic fluid. Question fluid overload. 4.  Aortic Atherosclerosis (ICD10-I70.0). 5. Limited exam secondary to lack of oral or IV contrast. Electronically Signed   By: Abigail Miyamoto M.D.   On: 12/06/2017 16:12   Dg Chest 2 View  Result Date: 12/06/2017 CLINICAL DATA:  Left upper quadrant pain and pressure for 7 days EXAM: CHEST  2 VIEW COMPARISON:  October 12, 2016 FINDINGS: The heart size and mediastinal contours are within normal limits. Mild central pulmonary vascular congestion is identified. No frank pulmonary edema. Minimal left pleural effusion noted. No focal pneumonia is identified. The visualized skeletal structures  are stable. IMPRESSION: Mild central pulmonary vascular congestion without frank pulmonary edema. Minimal left pleural effusion. Electronically Signed   By: Abelardo Diesel M.D.   On: 12/06/2017 16:19   Ir Fluoro Guide Cv Line Right  Result Date: 12/07/2017 INDICATION: End-stage renal disease, currently admitted with anemia and an elevated troponin. Request made for placement of a temporary dialysis catheter for the initiation of dialysis. EXAM: NON-TUNNELED CENTRAL VENOUS HEMODIALYSIS CATHETER PLACEMENT WITH ULTRASOUND AND FLUOROSCOPIC GUIDANCE COMPARISON:  None. MEDICATIONS: None FLUOROSCOPY TIME:  12 seconds (2 mGy) COMPLICATIONS: None immediate. PROCEDURE: Informed written consent was obtained from the patient after a discussion of the risks, benefits, and alternatives to treatment. Questions regarding the procedure were encouraged and answered. The right neck and chest were prepped with chlorhexidine in a sterile fashion, and a sterile drape was applied covering the operative field. Maximum barrier sterile technique with sterile gowns and gloves were used  for the procedure. A timeout was performed prior to the initiation of the procedure. After the overlying soft tissues were anesthetized, a small venotomy incision was created and a micropuncture kit was utilized to access the internal jugular vein. Real-time ultrasound guidance was utilized for vascular access including the acquisition of a permanent ultrasound image documenting patency of the accessed vessel. The microwire was utilized to measure appropriate catheter length. A stiff glidewire was advanced to the level of the IVC. Under fluoroscopic guidance, the venotomy was serially dilated, ultimately allowing placement of a 20 cm temporary Mahurkar catheter with tip ultimately terminating within the superior aspect of the right atrium. Final catheter positioning was confirmed and documented with a spot radiographic image. The catheter aspirates and flushes normally. The catheter was flushed with appropriate volume heparin dwells. The catheter exit site was secured with a 0-Prolene retention suture. A dressing was placed. The patient tolerated the procedure well without immediate post procedural complication. IMPRESSION: Successful placement of a right internal jugular approach 20 cm temporary dialysis catheter with tip terminating with in the superior aspect of the right atrium. The catheter is ready for immediate use. PLAN: This catheter may be converted to a tunneled dialysis catheter at a later date as indicated. Electronically Signed   By: Sandi Mariscal M.D.   On: 12/07/2017 11:46   Ir US Guide Vasc Access Right  Result Date: 12/07/2017 INDICATION: End-stage renal disease, currently admitted with anemia and an elevated troponin. Request made for placement of a temporary dialysis catheter for the initiation of dialysis. EXAM: NON-TUNNELED CENTRAL VENOUS HEMODIALYSIS CATHETER PLACEMENT WITH ULTRASOUND AND FLUOROSCOPIC GUIDANCE COMPARISON:  None. MEDICATIONS: None FLUOROSCOPY TIME:  12 seconds (2 mGy)  COMPLICATIONS: None immediate. PROCEDURE: Informed written consent was obtained from the patient after a discussion of the risks, benefits, and alternatives to treatment. Questions regarding the procedure were encouraged and answered. The right neck and chest were prepped with chlorhexidine in a sterile fashion, and a sterile drape was applied covering the operative field. Maximum barrier sterile technique with sterile gowns and gloves were used for the procedure. A timeout was performed prior to the initiation of the procedure. After the overlying soft tissues were anesthetized, a small venotomy incision was created and a micropuncture kit was utilized to access the internal jugular vein. Real-time ultrasound guidance was utilized for vascular access including the acquisition of a permanent ultrasound image documenting patency of the accessed vessel. The microwire was utilized to measure appropriate catheter length. A stiff glidewire was advanced to the level of the IVC. Under fluoroscopic guidance, the venotomy  was serially dilated, ultimately allowing placement of a 20 cm temporary Mahurkar catheter with tip ultimately terminating within the superior aspect of the right atrium. Final catheter positioning was confirmed and documented with a spot radiographic image. The catheter aspirates and flushes normally. The catheter was flushed with appropriate volume heparin dwells. The catheter exit site was secured with a 0-Prolene retention suture. A dressing was placed. The patient tolerated the procedure well without immediate post procedural complication. IMPRESSION: Successful placement of a right internal jugular approach 20 cm temporary dialysis catheter with tip terminating with in the superior aspect of the right atrium. The catheter is ready for immediate use. PLAN: This catheter may be converted to a tunneled dialysis catheter at a later date as indicated. Electronically Signed   By: Sandi Mariscal M.D.   On:  12/07/2017 11:46     Assessment and Plan: 1. Left distal ureteral stone with persistent pain and nausea.   Please let me know when he is safe to take to [facility] for ureteroscopy.         LOS: 2 days    Irine Seal 12/08/2017 [phone] ID: Otis Brace, male   DOB: August 15, 1961, 57 y.o.   MRN: [id number]

## 2017-12-08 NOTE — Op Note (Signed)
Preoperative diagnosis: Left ureteral stone, left hydronephrosis Postoperative diagnosis: Same  Procedure: Cystoscopy with left retrograde pyelogram, left ureteroscopy, holmium laser lithotripsy and left ureteral stent with string  Surgeon: Junious Silk  Anesthesia: General  Indication for procedure: 57 year old male with acute on chronic renal failure with a 4 mm left distal stone hydronephrosis and continued flank pain and nausea.  He was brought today for above procedures.  Findings: Cystoscopy was unremarkable.  There was no stone or foreign body in the bladder.  The mucosa appeared normal.  Left retrograde pyelogram-this outlined a single ureter single collecting system unit, there was a distal filling defect with proximal moderate dilation.  Left ureteroscopy-the stone was located in the left distal ureter.  Description of procedure: After consent was obtained the patient was brought to the operating room.  After adequate anesthesia he was placed in lithotomy position and prepped and draped in the usual sterile fashion.  A timeout was performed to confirm the patient and procedure.  The cystoscope was passed per urethra and the left ureteral orifice cannulated with a 6 [place] open-ended catheter and retrograde injection of contrast was performed.  A sensor wire was then advanced and coiled in the collecting system.  Copious amount of old debris laden urine poured from the ureteral orifice.  Patient remained stable and with no pressure the needlepoint ureteroscope was advanced into the left ureteral orifice without difficulty where the stone was located.  It was broken at 0.8 and 8 with a 200 m laser fiber and the fragments sequentially dropped in the bladder.  Minimal manipulation was needed.  I was then able to carefully pass the scope well up into the proximal ureter and no other stones were noted.  The ureteroscope was backed out and the ureter noted to be normal without fragment or injury.   Again minimal irrigation was used and no pressure was used.  The wire was backloaded on the cystoscope and a 6 x 26 cm stent was advanced.  The wire was removed with a partial coil seen in the upper pole collecting system and a good coil in the bladder.  I left a string on the stent.  I irrigated the bladder several times and could not locate any the small fragments.  The bladder was drained and the scope removed.  The string was fixed to the penis.  He was awakened and taken to the recovery room in stable condition.  Complications: None Blood loss: Minimal Specimens: None Drains: 6 x 26 cm left ureteral stent with string

## 2017-12-09 ENCOUNTER — Encounter (HOSPITAL_COMMUNITY): Payer: Self-pay

## 2017-12-09 ENCOUNTER — Inpatient Hospital Stay (HOSPITAL_COMMUNITY): Payer: Self-pay | Admitting: Anesthesiology

## 2017-12-09 ENCOUNTER — Encounter (HOSPITAL_COMMUNITY)
Admission: EM | Disposition: A | Discharge status: Home Health | Payer: Self-pay | Source: Home / Self Care | Attending: Internal Medicine

## 2017-12-09 ENCOUNTER — Inpatient Hospital Stay (HOSPITAL_COMMUNITY): Payer: Self-pay

## 2017-12-09 HISTORY — PX: INSERTION OF DIALYSIS CATHETER: SHX1324

## 2017-12-09 HISTORY — PX: AV FISTULA PLACEMENT: SHX1204

## 2017-12-09 LAB — CBC
HEMATOCRIT: 24.5 % — AB (ref 39.0–52.0)
HEMOGLOBIN: 8 g/dL — AB (ref 13.0–17.0)
MCH: 29 pg (ref 26.0–34.0)
MCHC: 32.7 g/dL (ref 30.0–36.0)
MCV: 88.8 fL (ref 78.0–100.0)
Platelets: 233 10*3/uL (ref 150–400)
RBC: 2.76 MIL/uL — ABNORMAL LOW (ref 4.22–5.81)
RDW: 14.1 % (ref 11.5–15.5)
WBC: 4.7 10*3/uL (ref 4.0–10.5)

## 2017-12-09 LAB — GLUCOSE, CAPILLARY
Glucose-Capillary: 81 mg/dL (ref 65–99)
Glucose-Capillary: 88 mg/dL (ref 65–99)
Glucose-Capillary: 84 mg/dL (ref 65–99)
Glucose-Capillary: 80 mg/dL (ref 65–99)
Glucose-Capillary: 161 mg/dL — ABNORMAL HIGH (ref 65–99)

## 2017-12-09 LAB — RENAL FUNCTION PANEL
Albumin: 2.5 g/dL — ABNORMAL LOW (ref 3.5–5.0)
Anion gap: 10 (ref 5–15)
BUN: 75 mg/dL — ABNORMAL HIGH (ref 6–20)
CO2: 23 mmol/L (ref 22–32)
Calcium: 6.5 mg/dL — ABNORMAL LOW (ref 8.9–10.3)
Chloride: 103 mmol/L (ref 101–111)
Creatinine, Ser: 10.48 mg/dL — ABNORMAL HIGH (ref 0.61–1.24)
GFR calc Af Amer: 6 mL/min — ABNORMAL LOW
GFR calc non Af Amer: 5 mL/min — ABNORMAL LOW
Glucose, Bld: 109 mg/dL — ABNORMAL HIGH (ref 65–99)
Phosphorus: 5 mg/dL — ABNORMAL HIGH (ref 2.5–4.6)
Potassium: 3.8 mmol/L (ref 3.5–5.1)
Sodium: 136 mmol/L (ref 135–145)

## 2017-12-09 LAB — HEPATITIS B SURFACE ANTIBODY,QUALITATIVE: Hep B S Ab: NONREACTIVE

## 2017-12-09 LAB — HEPATITIS B CORE ANTIBODY, TOTAL: Hep B Core Total Ab: NEGATIVE

## 2017-12-09 LAB — HEPATITIS B SURFACE ANTIGEN: HEP B S AG: NEGATIVE

## 2017-12-09 SURGERY — ARTERIOVENOUS (AV) FISTULA CREATION
Anesthesia: Monitor Anesthesia Care | Site: Neck | Laterality: Right

## 2017-12-09 MED ORDER — FENTANYL CITRATE (PF) 250 MCG/5ML IJ SOLN
INTRAMUSCULAR | Status: AC
  Filled 2017-12-09: qty 5

## 2017-12-09 MED ORDER — ONDANSETRON HCL 4 MG/2ML IJ SOLN
INTRAMUSCULAR | Status: DC | PRN
  Administered 2017-12-09: 4 mg via INTRAVENOUS

## 2017-12-09 MED ORDER — PROPOFOL 1000 MG/100ML IV EMUL
INTRAVENOUS | Status: AC
  Filled 2017-12-09: qty 100

## 2017-12-09 MED ORDER — MIDAZOLAM HCL 5 MG/5ML IJ SOLN
INTRAMUSCULAR | Status: DC | PRN
  Administered 2017-12-09 (×2): 1 mg via INTRAVENOUS

## 2017-12-09 MED ORDER — MIDAZOLAM HCL 2 MG/2ML IJ SOLN
INTRAMUSCULAR | Status: AC
  Filled 2017-12-09: qty 2

## 2017-12-09 MED ORDER — PROPOFOL 10 MG/ML IV BOLUS
INTRAVENOUS | Status: AC
  Filled 2017-12-09: qty 20

## 2017-12-09 MED ORDER — LIDOCAINE-EPINEPHRINE 0.5 %-1:200000 IJ SOLN
INTRAMUSCULAR | Status: DC | PRN
  Administered 2017-12-09: 13 mL

## 2017-12-09 MED ORDER — MEPERIDINE HCL 25 MG/ML IJ SOLN: 6.2500 mg | INTRAMUSCULAR | Status: DC | PRN

## 2017-12-09 MED ORDER — HEPARIN SODIUM (PORCINE) 1000 UNIT/ML IJ SOLN
INTRAMUSCULAR | Status: DC | PRN
  Administered 2017-12-09: 3400 [IU]

## 2017-12-09 MED ORDER — LIDOCAINE-EPINEPHRINE 0.5 %-1:200000 IJ SOLN
INTRAMUSCULAR | Status: AC
  Filled 2017-12-09: qty 1

## 2017-12-09 MED ORDER — FENTANYL CITRATE (PF) 100 MCG/2ML IJ SOLN
INTRAMUSCULAR | Status: DC | PRN
  Administered 2017-12-09 (×2): 50 ug via INTRAVENOUS

## 2017-12-09 MED ORDER — PHENYLEPHRINE HCL 10 MG/ML IJ SOLN
INTRAVENOUS | Status: DC | PRN
  Administered 2017-12-09: 40 ug/min via INTRAVENOUS

## 2017-12-09 MED ORDER — CEFAZOLIN SODIUM-DEXTROSE 2-4 GM/100ML-% IV SOLN
INTRAVENOUS | Status: AC
  Filled 2017-12-09: qty 100

## 2017-12-09 MED ORDER — SODIUM CHLORIDE 0.9 % IV SOLN
INTRAVENOUS | Status: DC
  Administered 2017-12-09: 10:00:00 via INTRAVENOUS

## 2017-12-09 MED ORDER — 0.9 % SODIUM CHLORIDE (POUR BTL) OPTIME
TOPICAL | Status: DC | PRN
  Administered 2017-12-09: 1000 mL

## 2017-12-09 MED ORDER — PROMETHAZINE HCL 25 MG/ML IJ SOLN: 6.2500 mg | INTRAMUSCULAR | Status: DC | PRN

## 2017-12-09 MED ORDER — TRAMADOL HCL 50 MG PO TABS
50.0000 mg | ORAL_TABLET | Freq: Four times a day (QID) | ORAL | Status: DC | PRN
  Administered 2017-12-13 – 2017-12-15 (×4): 50 mg via ORAL
  Filled 2017-12-09 (×5): qty 1

## 2017-12-09 MED ORDER — SODIUM CHLORIDE 0.9 % IV SOLN
INTRAVENOUS | Status: DC | PRN
  Administered 2017-12-09: 11:00:00

## 2017-12-09 MED ORDER — HEPARIN SODIUM (PORCINE) 1000 UNIT/ML IJ SOLN
INTRAMUSCULAR | Status: AC
  Filled 2017-12-09: qty 1

## 2017-12-09 MED ORDER — MIDAZOLAM HCL 2 MG/2ML IJ SOLN: 0.5000 mg | Freq: Once | INTRAMUSCULAR | Status: DC | PRN

## 2017-12-09 MED ORDER — FENTANYL CITRATE (PF) 100 MCG/2ML IJ SOLN: 25.0000 ug | INTRAMUSCULAR | Status: DC | PRN

## 2017-12-09 MED ORDER — PROPOFOL 500 MG/50ML IV EMUL
INTRAVENOUS | Status: DC | PRN
  Administered 2017-12-09: 75 ug/kg/min via INTRAVENOUS

## 2017-12-09 MED ORDER — CEFAZOLIN SODIUM-DEXTROSE 2-3 GM-%(50ML) IV SOLR
INTRAVENOUS | Status: DC | PRN
  Administered 2017-12-09: 2 g via INTRAVENOUS

## 2017-12-09 MED ORDER — DARBEPOETIN ALFA 200 MCG/0.4ML IJ SOSY
PREFILLED_SYRINGE | INTRAMUSCULAR | Status: AC
  Administered 2017-12-09: 200 ug via INTRAVENOUS
  Filled 2017-12-09: qty 0.4

## 2017-12-09 SURGICAL SUPPLY — 55 items
ADH SKN CLS APL DERMABOND .7 (GAUZE/BANDAGES/DRESSINGS) ×4
ARMBAND PINK RESTRICT EXTREMIT (MISCELLANEOUS) ×8 IMPLANT
BAG DECANTER FOR FLEXI CONT (MISCELLANEOUS) ×4 IMPLANT
BIOPATCH RED 1 DISK 7.0 (GAUZE/BANDAGES/DRESSINGS) ×3 IMPLANT
BIOPATCH RED 1IN DISK 7.0MM (GAUZE/BANDAGES/DRESSINGS) ×1
CANISTER SUCT 3000ML PPV (MISCELLANEOUS) ×4 IMPLANT
CANNULA VESSEL 3MM 2 BLNT TIP (CANNULA) ×4 IMPLANT
CATH PALINDROME RT-P 15FX19CM (CATHETERS) IMPLANT
CATH PALINDROME RT-P 15FX23CM (CATHETERS) ×2 IMPLANT
CATH PALINDROME RT-P 15FX28CM (CATHETERS) IMPLANT
CATH PALINDROME RT-P 15FX55CM (CATHETERS) IMPLANT
CLIP LIGATING EXTRA MED SLVR (CLIP) ×4 IMPLANT
CLIP LIGATING EXTRA SM BLUE (MISCELLANEOUS) ×4 IMPLANT
COVER PROBE W GEL 5X96 (DRAPES) ×4 IMPLANT
COVER SURGICAL LIGHT HANDLE (MISCELLANEOUS) ×4 IMPLANT
DECANTER SPIKE VIAL GLASS SM (MISCELLANEOUS) ×6 IMPLANT
DERMABOND ADVANCED (GAUZE/BANDAGES/DRESSINGS) ×4
DERMABOND ADVANCED .7 DNX12 (GAUZE/BANDAGES/DRESSINGS) ×2 IMPLANT
DRAPE C-ARM 42X72 X-RAY (DRAPES) ×4 IMPLANT
DRAPE CHEST BREAST 15X10 FENES (DRAPES) ×6 IMPLANT
ELECT REM PT RETURN 9FT ADLT (ELECTROSURGICAL) ×4
ELECTRODE REM PT RTRN 9FT ADLT (ELECTROSURGICAL) ×2 IMPLANT
GLOVE BIOGEL PI IND STRL 6 (GLOVE) IMPLANT
GLOVE BIOGEL PI IND STRL 6.5 (GLOVE) IMPLANT
GLOVE BIOGEL PI INDICATOR 6 (GLOVE) ×2
GLOVE BIOGEL PI INDICATOR 6.5 (GLOVE) ×2
GLOVE SS BIOGEL STRL SZ 7.5 (GLOVE) ×2 IMPLANT
GLOVE SUPERSENSE BIOGEL SZ 7.5 (GLOVE) ×2
GOWN STRL REUS W/ TWL LRG LVL3 (GOWN DISPOSABLE) ×6 IMPLANT
GOWN STRL REUS W/TWL LRG LVL3 (GOWN DISPOSABLE) ×12
KIT BASIN OR (CUSTOM PROCEDURE TRAY) ×4 IMPLANT
KIT ROOM TURNOVER OR (KITS) ×4 IMPLANT
NDL 18GX1X1/2 (RX/OR ONLY) (NEEDLE) ×2 IMPLANT
NDL HYPO 25GX1X1/2 BEV (NEEDLE) ×2 IMPLANT
NEEDLE 18GX1X1/2 (RX/OR ONLY) (NEEDLE) ×8 IMPLANT
NEEDLE 22X1 1/2 (OR ONLY) (NEEDLE) IMPLANT
NEEDLE HYPO 25GX1X1/2 BEV (NEEDLE) ×4 IMPLANT
NS IRRIG 1000ML POUR BTL (IV SOLUTION) ×4 IMPLANT
PACK CV ACCESS (CUSTOM PROCEDURE TRAY) ×4 IMPLANT
PACK SURGICAL SETUP 50X90 (CUSTOM PROCEDURE TRAY) ×4 IMPLANT
PAD ARMBOARD 7.5X6 YLW CONV (MISCELLANEOUS) ×8 IMPLANT
SOAP 2 % CHG 4 OZ (WOUND CARE) ×4 IMPLANT
SUT ETHILON 3 0 PS 1 (SUTURE) ×6 IMPLANT
SUT PROLENE 6 0 CC (SUTURE) ×6 IMPLANT
SUT VIC AB 3-0 SH 27 (SUTURE) ×8
SUT VIC AB 3-0 SH 27X BRD (SUTURE) ×2 IMPLANT
SUT VICRYL 4-0 PS2 18IN ABS (SUTURE) ×4 IMPLANT
SYR 10ML LL (SYRINGE) ×6 IMPLANT
SYR 20CC LL (SYRINGE) ×4 IMPLANT
SYR 5ML LL (SYRINGE) ×8 IMPLANT
SYR CONTROL 10ML LL (SYRINGE) ×4 IMPLANT
TOWEL GREEN STERILE (TOWEL DISPOSABLE) ×8 IMPLANT
TOWEL GREEN STERILE FF (TOWEL DISPOSABLE) ×4 IMPLANT
UNDERPAD 30X30 (UNDERPADS AND DIAPERS) ×4 IMPLANT
WATER STERILE IRR 1000ML POUR (IV SOLUTION) ×4 IMPLANT

## 2017-12-09 NOTE — Progress Notes (Signed)
PROGRESS NOTE  Fernando Jones OZD:[id number] DOB: 1961-08-11 DOA: 12/06/2017 PCP: Patient, No Pcp Per   LOS: 3 days   Brief Narrative / Interim history: 57 y.o. Hispanic male, with history of diabetes mellitus, hypertension, CKD V, who came to hospital with abdominal pain with nausea and vomiting. CT showed mild left sided hydronephrosis and distal ureteral calculus, possible ascending colon wall thickening, bilateral pleural effusions. Labs showed BUN 36, creatinine 15.16.  Nephrology and urology were consulted.  He reports that patient has been noncompliant with his medications for his hypertension as well as diabetes.  Assessment & Plan: Active Problems:   DM (diabetes mellitus), type 2 with renal complications (HCC)   Essential hypertension   ESRD (end stage renal disease) (HCC)  End-stage renal disease, new HD patient -Patient's previous creatinine from 2017 was around 5, on admission 15.17.  He has been noncompliant with his home medications.  BUN is 136, and seems to have symptomatic uremic symptoms with transient episodes of confusion. -Nephrology consulted, patient had dialysis catheter placed, converted to tunneled catheter 1-04.Marland Kitchen He underwent HD 1-02. -will need Clip and permanent access for HD.  Underwent AV fistula 1-04.    Hypertension -Continue home medications with Coreg and hydralazine.    Anemia; iron deficiency.  Needs out patient colonoscopy.  Needs iron tablet at discharge  On aranesp.   Left ureteral stone/hydronephrosis:  -Urology consulted. Discussed with Dr Lorrene Reid ok to proceed with cystoscopy 1-03 Un underwent cystoscopy , stone extraction and stent placement 1-03. He report improvement of pain and nausea.  He had 670 urine retention, discussed with Dr Roni Bread ok to place foley catheter,.   - Elevated troponin -0.05>> 0.06>> 0.07, overall flat, not in a pattern consistent with ACS -No chest pain  History of diabetes mellitus -Last hemoglobin A1c  from 2017 was 5.3.  Repeat hemoglobin A1c now 4.6, this is to be under control, question whether he truly had diabetes in the past or not.  Normocytic anemia likely in the setting of end-stage renal disease -No evidence of bleeding.  -received one unit PRBC this admission.   ?  Colitis -CT scan concerning for thickening of the ascending colon, however patient is asymptomatic -Denies abdominal pain, denies diarrhea.   DVT prophylaxis: heparin Code Status: Full code Family Communication: no family at bedside Disposition Plan: home when ready   Consultants:   Nephrology  Urology  Procedures:   Tunneled HD cath placement 1/2  Antimicrobials:  None  Subjective: -he just came for AV fistula placement.  He is alert, he is feeling ok, he relates that flank pain has improved, nausea has improved. He wants to eat.    Objective: Vitals:   12/09/17 1415 12/09/17 1430 12/09/17 1444 12/09/17 1445  BP: (!) 152/67 (!) 156/62  (!) 153/71  Pulse: 66 67 69 70  Resp: 12 10 12 13   Temp:    97.8 F (36.6 C)  TempSrc:      SpO2: 100% 100% 100% 100%  Weight:        Intake/Output Summary (Last 24 hours) at 12/09/2017 1512 Last data filed at 12/09/2017 1345 Gross per 24 hour  Intake 840 ml  Output 2720 ml  Net -1880 ml   Filed Weights   12/07/17 2322 12/09/17 0027 12/09/17 0337  Weight: 64.3 kg (141 lb 12.1 oz) 65.6 kg (144 lb 10 oz) 63.6 kg (140 lb 3.4 oz)    Examination:  Constitutional: NAD ENMT: MMM Respiratory: CTA Cardiovascular: S 1, S 2 RRR Abdomen:  BS present, soft, nt Skin: no rashes.  Neurologic: alert.  Extremities; right arm with incision after AV fistula sx.  Psychiatric: non focal.    Data Reviewed: I have independently reviewed following labs and imaging studies   CBC: Recent Labs  Lab 12/06/17 1348 12/06/17 2102 12/07/17 0349 12/08/17 0419 12/09/17 0207  WBC 7.8 8.7 6.7 6.0 4.7  HGB 7.9* 7.6* 6.9* 8.1* 8.0*  HCT 23.6* 22.3* 19.9* 23.7* 24.5*    MCV 88.1 87.5 87.7 87.8 88.8  PLT 247 252 212 211 852   Basic Metabolic Panel: Recent Labs  Lab 12/06/17 1348 12/06/17 2102 12/07/17 0345 12/07/17 0920 12/08/17 0419 12/09/17 0207  NA 135  --  133* 134* 136 136  K 5.0  --  5.0 4.7 4.0 3.8  CL 106  --  107 109 105 103  CO2 12*  --  10* 11* 18* 23  GLUCOSE 93  --  68 83 78 109*  BUN 136*  --  133* 133* 80* 75*  CREATININE 15.16* 15.61* 15.80* 15.89* 11.15* 10.48*  CALCIUM 6.1*  --  6.2* 6.2* 6.5* 6.5*  MG  --   --   --   --  1.8  --   PHOS  --   --  9.8*  --   --  5.0*   GFR: CrCl cannot be calculated (Unknown ideal weight.). Liver Function Tests: Recent Labs  Lab 12/06/17 1348 12/07/17 0345 12/07/17 0920 12/07/17 2100 12/08/17 0419 12/09/17 0207  AST 9*  --  7*  --  10*  --   ALT 10*  --  8* 8* 9*  --   ALKPHOS 65  --  51  --  55  --   BILITOT 0.5  --  0.4  --  0.5  --   PROT 6.8  --  5.0*  --  5.2*  --   ALBUMIN 3.4* 2.7* 2.5*  --  2.5* 2.5*   Recent Labs  Lab 12/06/17 1348  LIPASE 40   No results for input(s): AMMONIA in the last 168 hours. Coagulation Profile: Recent Labs  Lab 12/07/17 0349  INR 1.25   Cardiac Enzymes: Recent Labs  Lab 12/06/17 1348 12/06/17 2102 12/07/17 0349 12/07/17 0920  TROPONINI 0.07* 0.05* 0.06* 0.07*   BNP (last 3 results) No results for input(s): PROBNP in the last 8760 hours. HbA1C: Recent Labs    12/06/17 2102  HGBA1C 4.6*   CBG: Recent Labs  Lab 12/08/17 1758 12/08/17 2008 12/09/17 0414 12/09/17 0757 12/09/17 1346  GLUCAP 83 88 81 88 84   Lipid Profile: No results for input(s): CHOL, HDL, LDLCALC, TRIG, CHOLHDL, LDLDIRECT in the last 72 hours. Thyroid Function Tests: No results for input(s): TSH, T4TOTAL, FREET4, T3FREE, THYROIDAB in the last 72 hours. Anemia Panel: Recent Labs    12/07/17 0349  FERRITIN 189  TIBC 178*  IRON 39*   Urine analysis:    Component Value Date/Time   COLORURINE YELLOW 12/06/2017 1339   APPEARANCEUR CLEAR  12/06/2017 1339   LABSPEC 1.011 12/06/2017 1339   PHURINE 5.0 12/06/2017 1339   GLUCOSEU 50 (A) 12/06/2017 1339   HGBUR MODERATE (A) 12/06/2017 1339   BILIRUBINUR NEGATIVE 12/06/2017 1339   KETONESUR NEGATIVE 12/06/2017 1339   PROTEINUR >=300 (A) 12/06/2017 1339   NITRITE NEGATIVE 12/06/2017 1339   LEUKOCYTESUR NEGATIVE 12/06/2017 1339   Sepsis Labs: Invalid input(s): PROCALCITONIN, LACTICIDVEN  No results found for this or any previous visit (from the past 240 hour(s)).    Radiology  Studies: Dg Chest Port 1 View  Result Date: 12/09/2017 CLINICAL DATA:  Vascular catheter. EXAM: PORTABLE CHEST 1 VIEW COMPARISON:  No recent prior . FINDINGS: Right central line noted with tip over right atrium. Cardiomegaly with mild pulmonary vascular prominence and interstitial prominence. Small bilateral pleural effusions. Findings suggest mild CHF. No pneumothorax. IMPRESSION: 1. Right central line noted with tip over right atrium. 2. Cardiomegaly with mild pulmonary vascular prominence, bilateral interstitial prominence, small bilateral pleural effusions consistent mild CHF. Electronically Signed   By: Marcello Moores  Register   On: 12/09/2017 14:52   Dg C-arm 1-60 Min-no Report  Result Date: 12/08/2017 Fluoroscopy was utilized by the requesting physician.  No radiographic interpretation.     Scheduled Meds: . aspirin EC  81 mg Oral Daily  . [START ON 12/10/2017] calcitRIOL  0.25 mcg Oral Q T,Th,Sa-HD  . calcium carbonate  4 tablet Oral TID WC  . calcium carbonate  4 tablet Oral TID  . carvedilol  12.5 mg Oral BID WC  . darbepoetin (ARANESP) injection - DIALYSIS  200 mcg Intravenous Q Thu-HD  . heparin  5,000 Units Subcutaneous Q8H  . hydrALAZINE  25 mg Oral Q8H  . promethazine  25 mg Intravenous Once  . sodium chloride flush  3 mL Intravenous Q12H   Continuous Infusions: . sodium chloride    . sodium chloride 10 mL/hr at 12/09/17 1014  . ferric gluconate (FERRLECIT/NULECIT) IV 125 mg (12/09/17 0314)     Niel Hummer, MD [facility] Pager [phone]  If 7PM-7AM, please contact night-coverage [email or web address] Password [email or web address] 12/09/2017, 3:12 PM

## 2017-12-09 NOTE — Plan of Care (Signed)
[place]

## 2017-12-09 NOTE — Anesthesia Postprocedure Evaluation (Signed)
Anesthesia Post Note  Patient: Fernando Jones  Procedure(s) Performed: ARTERIOVENOUS (AV) FISTULA CREATION (Right ) EXCHANGE  OF TUNNELED  DIALYSIS CATHETER RIGHT INTERNAL JUGULAR. (Right Neck)     Patient location during evaluation: PACU Anesthesia Type: MAC Level of consciousness: awake and alert, patient cooperative and oriented Pain management: pain level controlled Vital Signs Assessment: post-procedure vital signs reviewed and stable Respiratory status: nonlabored ventilation, spontaneous breathing and respiratory function stable Cardiovascular status: blood pressure returned to baseline and stable Postop Assessment: no apparent nausea or vomiting Anesthetic complications: no    Last Vitals:  Vitals:   12/09/17 1444 12/09/17 1445  BP:  (!) 153/71  Pulse: 69 70  Resp: 12 13  Temp:  36.6 C  SpO2: 100% 100%    Last Pain:  Vitals:   12/09/17 1430  TempSrc:   PainSc: 0-No pain                 Fernando Jones,Fernando Jones

## 2017-12-09 NOTE — Progress Notes (Signed)
1 Day Post-Op  Subjective: POD #1 from ureteroscopic left ureteral stone removal and stenting.  He continues to have pain but no fever.   He is anuric.  ROS:  Review of Systems  Constitutional: Negative for fever.  Gastrointestinal: Positive for nausea.    Anti-infectives: Anti-infectives (From admission, onward)   Start     Dose/Rate Route Frequency Ordered Stop   12/09/17 1000  ceFAZolin (ANCEF) IVPB 1 g/50 mL premix  Status:  Discontinued     1 g 100 mL/hr over 30 Minutes Intravenous To [facility]Stay [facility] 12/08/17 1210 12/08/17 1938   12/09/17 0000  ceFAZolin (ANCEF) IVPB 1 g/50 mL premix  Status:  Discontinued    Comments:  Send with pt to OR   1 g 100 mL/hr over 30 Minutes Intravenous On call 12/08/17 1200 12/08/17 1216   12/08/17 1558  ceFAZolin (ANCEF) 2-4 GM/100ML-% IVPB    Comments:  Fernando Jones   : cabinet override      12/08/17 1558 12/08/17 1705   12/08/17 1551  ceFAZolin (ANCEF) IVPB 2g/100 mL premix     2 g 200 mL/hr over 30 Minutes Intravenous 30 min pre-op 12/08/17 1552 12/08/17 1720      Current Facility-Administered Medications  Medication Dose Route Frequency Provider Last Rate Last Dose  . 0.9 %  sodium chloride infusion  250 mL Intravenous PRN Oswald Hillock, MD      . acetaminophen (TYLENOL) tablet 650 mg  650 mg Oral Q6H PRN Oswald Hillock, MD   650 mg at 12/08/17 2108   Or  . acetaminophen (TYLENOL) suppository 650 mg  650 mg Rectal Q6H PRN Oswald Hillock, MD      . aspirin EC tablet 81 mg  81 mg Oral Daily Oswald Hillock, MD   81 mg at 12/08/17 1111  . [START ON 12/10/2017] calcitRIOL (ROCALTROL) capsule 0.25 mcg  0.25 mcg Oral Q T,Th,Sa-HD Jamal Maes, MD      . calcium carbonate (TUMS - dosed in mg elemental calcium) chewable tablet 800 mg of elemental calcium  4 tablet Oral TID WC Jamal Maes, MD   800 mg of elemental calcium at 12/08/17 1238  . calcium carbonate (TUMS - dosed in mg elemental calcium) chewable tablet 800 mg of elemental calcium   4 tablet Oral TID Jamal Maes, MD   800 mg of elemental calcium at 12/08/17 2108  . carvedilol (COREG) tablet 12.5 mg  12.5 mg Oral BID WC Oswald Hillock, MD   12.5 mg at 12/08/17 0820  . Darbepoetin Alfa (ARANESP) injection 200 mcg  200 mcg Intravenous Q Thu-HD Jamal Maes, MD   200 mcg at 12/09/17 [phone]  . ferric gluconate (NULECIT) 125 mg in sodium chloride 0.9 % 100 mL IVPB  125 mg Intravenous Daily Jamal Maes, MD 110 mL/hr at 12/09/17 0314 125 mg at 12/09/17 0314  . heparin injection 1,000 Units  1,000 Units Dialysis PRN Jamal Maes, MD   1,000 Units at 12/07/17 2320  . heparin injection 5,000 Units  5,000 Units Subcutaneous Q8H Monia Sabal, PA-C   5,000 Units at 12/08/17 2116  . heparin injection   Intravenous PRN Sandi Mariscal, MD   2,800 Units at 12/07/17 1125  . hydrALAZINE (APRESOLINE) 20 MG/ML injection           . hydrALAZINE (APRESOLINE) injection 10 mg  10 mg Intravenous Q4H PRN Oswald Hillock, MD   10 mg at 12/09/17 0427  . hydrALAZINE (APRESOLINE) tablet 25 mg  25  mg Oral Q8H Oswald Hillock, MD   25 mg at 12/08/17 0509  . HYDROmorphone (DILAUDID) injection 0.5-1 mg  0.5-1 mg Intravenous Q4H PRN Caren Griffins, MD   1 mg at 12/08/17 0645  . lidocaine (PF) (XYLOCAINE) 1 % injection    PRN Sandi Mariscal, MD   2 mL at 12/07/17 1121  . ondansetron (ZOFRAN) tablet 4 mg  4 mg Oral Q6H PRN Oswald Hillock, MD       Or  . ondansetron [facility]) injection 4 mg  4 mg Intravenous Q6H PRN Oswald Hillock, MD   4 mg at 12/08/17 0944  . promethazine (PHENERGAN) injection 25 mg  25 mg Intravenous Once Sandi Mariscal, MD      . sodium chloride flush (NS) 0.9 % injection 3 mL  3 mL Intravenous Q12H Oswald Hillock, MD   3 mL at 12/08/17 2114  . sodium chloride flush (NS) 0.9 % injection 3 mL  3 mL Intravenous PRN Oswald Hillock, MD         Objective: Vital signs in last 24 hours: Temp:  [97.4 F (36.3 C)-99.3 F (37.4 C)] 99.3 F (37.4 C) (01/04 0418) Pulse Rate:  [75-90] 89 (01/04  0418) Resp:  [12-18] 18 (01/04 0418) BP: (147-209)/(57-99) 183/73 (01/04 0418) SpO2:  [96 %-100 %] 98 % (01/04 0418) Weight:  [63.6 kg (140 lb 3.4 oz)-65.6 kg (144 lb 10 oz)] 63.6 kg (140 lb 3.4 oz) (01/04 0337)  Intake/Output from previous day: 01/03 0701 - 01/04 0700 In: 676 [P.O.:456; I.V.:220] Out: 2200 [Emesis/NG output:200] Intake/Output this shift: Total I/O In: 240 [P.O.:220; I.V.:20] Out: 2000 [Other:2000]   Physical Exam  Constitutional: He is well-developed, well-nourished, and in no distress.  Abdominal: Soft. There is tenderness (LLQ).  Vitals reviewed.   Lab Results:  Recent Labs    12/08/17 0419 12/09/17 0207  WBC 6.0 4.7  HGB 8.1* 8.0*  HCT 23.7* 24.5*  PLT 211 233   BMET Recent Labs    12/08/17 0419 12/09/17 0207  NA 136 136  K 4.0 3.8  CL 105 103  CO2 18* 23  GLUCOSE 78 109*  BUN 80* 75*  CREATININE 11.15* 10.48*  CALCIUM 6.5* 6.5*   PT/INR Recent Labs    12/07/17 0349  LABPROT 15.6*  INR 1.25   ABG No results for input(s): PHART, HCO3 in the last 72 hours.  Invalid input(s): PCO2, PO2  Studies/Results: Ir Fluoro Guide Cv Line Right  Result Date: 12/07/2017 INDICATION: End-stage renal disease, currently admitted with anemia and an elevated troponin. Request made for placement of a temporary dialysis catheter for the initiation of dialysis. EXAM: NON-TUNNELED CENTRAL VENOUS HEMODIALYSIS CATHETER PLACEMENT WITH ULTRASOUND AND FLUOROSCOPIC GUIDANCE COMPARISON:  None. MEDICATIONS: None FLUOROSCOPY TIME:  12 seconds (2 mGy) COMPLICATIONS: None immediate. PROCEDURE: Informed written consent was obtained from the patient after a discussion of the risks, benefits, and alternatives to treatment. Questions regarding the procedure were encouraged and answered. The right neck and chest were prepped with chlorhexidine in a sterile fashion, and a sterile drape was applied covering the operative field. Maximum barrier sterile technique with sterile gowns  and gloves were used for the procedure. A timeout was performed prior to the initiation of the procedure. After the overlying soft tissues were anesthetized, a small venotomy incision was created and a micropuncture kit was utilized to access the internal jugular vein. Real-time ultrasound guidance was utilized for vascular access including the acquisition of a permanent ultrasound image documenting  patency of the accessed vessel. The microwire was utilized to measure appropriate catheter length. A stiff glidewire was advanced to the level of the IVC. Under fluoroscopic guidance, the venotomy was serially dilated, ultimately allowing placement of a 20 cm temporary Mahurkar catheter with tip ultimately terminating within the superior aspect of the right atrium. Final catheter positioning was confirmed and documented with a spot radiographic image. The catheter aspirates and flushes normally. The catheter was flushed with appropriate volume heparin dwells. The catheter exit site was secured with a 0-Prolene retention suture. A dressing was placed. The patient tolerated the procedure well without immediate post procedural complication. IMPRESSION: Successful placement of a right internal jugular approach 20 cm temporary dialysis catheter with tip terminating with in the superior aspect of the right atrium. The catheter is ready for immediate use. PLAN: This catheter may be converted to a tunneled dialysis catheter at a later date as indicated. Electronically Signed   By: Sandi Mariscal M.D.   On: 12/07/2017 11:46   Ir US Guide Vasc Access Right  Result Date: 12/07/2017 INDICATION: End-stage renal disease, currently admitted with anemia and an elevated troponin. Request made for placement of a temporary dialysis catheter for the initiation of dialysis. EXAM: NON-TUNNELED CENTRAL VENOUS HEMODIALYSIS CATHETER PLACEMENT WITH ULTRASOUND AND FLUOROSCOPIC GUIDANCE COMPARISON:  None. MEDICATIONS: None FLUOROSCOPY TIME:  12  seconds (2 mGy) COMPLICATIONS: None immediate. PROCEDURE: Informed written consent was obtained from the patient after a discussion of the risks, benefits, and alternatives to treatment. Questions regarding the procedure were encouraged and answered. The right neck and chest were prepped with chlorhexidine in a sterile fashion, and a sterile drape was applied covering the operative field. Maximum barrier sterile technique with sterile gowns and gloves were used for the procedure. A timeout was performed prior to the initiation of the procedure. After the overlying soft tissues were anesthetized, a small venotomy incision was created and a micropuncture kit was utilized to access the internal jugular vein. Real-time ultrasound guidance was utilized for vascular access including the acquisition of a permanent ultrasound image documenting patency of the accessed vessel. The microwire was utilized to measure appropriate catheter length. A stiff glidewire was advanced to the level of the IVC. Under fluoroscopic guidance, the venotomy was serially dilated, ultimately allowing placement of a 20 cm temporary Mahurkar catheter with tip ultimately terminating within the superior aspect of the right atrium. Final catheter positioning was confirmed and documented with a spot radiographic image. The catheter aspirates and flushes normally. The catheter was flushed with appropriate volume heparin dwells. The catheter exit site was secured with a 0-Prolene retention suture. A dressing was placed. The patient tolerated the procedure well without immediate post procedural complication. IMPRESSION: Successful placement of a right internal jugular approach 20 cm temporary dialysis catheter with tip terminating with in the superior aspect of the right atrium. The catheter is ready for immediate use. PLAN: This catheter may be converted to a tunneled dialysis catheter at a later date as indicated. Electronically Signed   By: Sandi Mariscal  M.D.   On: 12/07/2017 11:46   Dg C-arm 1-60 Min-no Report  Result Date: 12/08/2017 Fluoroscopy was utilized by the requesting physician.  No radiographic interpretation.     Assessment and Plan: 1. Left ureteral stone now s/p ureteroscopic stone removal and stent insertion.  I will remove the stent on Monday.  He has a tether so he won't have to go back to the OR.       LOS:  3 days    Irine Seal 12/09/2017 [phone] ID: Fernando Jones, male   DOB: [birth year], 57 y.o.   MRN: [id number]

## 2017-12-09 NOTE — Interval H&P Note (Signed)
History and Physical Interval Note:  12/09/2017 10:48 AM  Fernando Jones  has presented today for surgery, with the diagnosis of end stage renal  The various methods of treatment have been discussed with the patient and family. After consideration of risks, benefits and other options for treatment, the patient has consented to  Procedure(s): ARTERIOVENOUS (AV) FISTULA CREATION VERSUS GRAFT (Right) INSERTION OF TUNNELED  DIALYSIS CATHETER (N/A) as a surgical intervention .  The patient's history has been reviewed, patient examined, no change in status, stable for surgery.  I have reviewed the patient's chart and labs.  Questions were answered to the patient's satisfaction.     Curt Jews

## 2017-12-09 NOTE — Anesthesia Preprocedure Evaluation (Addendum)
Anesthesia Evaluation  Patient identified by MRN, date of birth, ID band Patient awake    Reviewed: Allergy & Precautions, NPO status , Patient's Chart, lab work & pertinent test results  History of Anesthesia Complications Negative for: history of anesthetic complications  Airway Mallampati: II  TM Distance: >3 FB Neck ROM: Full    Dental  (+) Dental Advisory Given, Poor Dentition   Pulmonary former smoker,    breath sounds clear to auscultation       Cardiovascular hypertension (controlled with dialysis), (-) angina Rhythm:Regular Rate:Normal  '17 ECHO: EF 55-60%, valves oK   Neuro/Psych negative neurological ROS     GI/Hepatic negative GI ROS, (+) Hepatitis -  Endo/Other  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), Insulin Dependent  Renal/GU ESRFRenal disease (glu 3.8)     Musculoskeletal   Abdominal   Peds  Hematology  (+) Blood dyscrasia (Hb 8.0), anemia ,   Anesthesia Other Findings   Reproductive/Obstetrics                            Anesthesia Physical Anesthesia Plan  ASA: III  Anesthesia Plan: MAC   Post-op Pain Management:    Induction:   PONV Risk Score and Plan: 1  Airway Management Planned: Natural Airway and Simple Face Mask  Additional Equipment:   Intra-op Plan:   Post-operative Plan:   Informed Consent: I have reviewed the patients History and Physical, chart, labs and discussed the procedure including the risks, benefits and alternatives for the proposed anesthesia with the patient or authorized representative who has indicated his/her understanding and acceptance.   Dental advisory given  Plan Discussed with: CRNA and Surgeon  Anesthesia Plan Comments: (Plan routine monitors, MAC)        Anesthesia Quick Evaluation

## 2017-12-09 NOTE — Anesthesia Procedure Notes (Signed)
Procedure Name: MAC Date/Time: 12/09/2017 11:30 AM Performed by: Lowella Dell, CRNA Pre-anesthesia Checklist: Patient identified, Emergency Drugs available, Suction available, Patient being monitored and Timeout performed Patient Re-evaluated:Patient Re-evaluated prior to induction Oxygen Delivery Method: Simple face mask Induction Type: IV induction Placement Confirmation: positive ETCO2 Dental Injury: Teeth and Oropharynx as per pre-operative assessment

## 2017-12-09 NOTE — Progress Notes (Signed)
Pt c/o feeling pressure at the abdomen, bladder scan shows 697 urine retention, on call practitioner [place] notified called me back that cannot do in and out cath due to the string that is attach to the  ureteral stent that runs through the penis, per on  Call physician blount the string might pulled off if in and out cath is done wants me to pass it on to the RN for the patient this morning to let the urologist / nephrologist know about patient  situation, will continue to monitor pt

## 2017-12-09 NOTE — Progress Notes (Signed)
Spoke to urologist about string outside ureter. Floor RN also said patient had 700 cc of urine in bladder. Urologist and hospitalist were notified and urologist stated he wanted a foley catheter placed in the OR despite string from ureter. Spoke to Dr. Jeffie Pollock directly. Will continue to monitor patient.

## 2017-12-09 NOTE — Progress Notes (Signed)
Admit: 12/06/2017 LOS: 3  18M new ESRD, anemia, obstructing L kidney stone  Subjective:  For AVF, tunneled HD cath today, seen in PACU HD #2 overnight: 2L UF, 3h, Qb 250, post weight 63.6kg L ureteral stone removed yesterday; L ureteral stent placed   01/03 0701 - 01/04 0700 In: 676 [P.O.:456; I.V.:220] Out: 2200 [Emesis/NG output:200]  Filed Weights   12/07/17 2322 12/09/17 0027 12/09/17 0337  Weight: 64.3 kg (141 lb 12.1 oz) 65.6 kg (144 lb 10 oz) 63.6 kg (140 lb 3.4 oz)    Scheduled Meds: . [MAR Hold] aspirin EC  81 mg Oral Daily  . [MAR Hold] calcitRIOL  0.25 mcg Oral Q T,Th,Sa-HD  . [MAR Hold] calcium carbonate  4 tablet Oral TID WC  . [MAR Hold] calcium carbonate  4 tablet Oral TID  . [MAR Hold] carvedilol  12.5 mg Oral BID WC  . [MAR Hold] darbepoetin (ARANESP) injection - DIALYSIS  200 mcg Intravenous Q Thu-HD  . [MAR Hold] heparin  5,000 Units Subcutaneous Q8H  . [MAR Hold] hydrALAZINE  25 mg Oral Q8H  . [MAR Hold] promethazine  25 mg Intravenous Once  . [MAR Hold] sodium chloride flush  3 mL Intravenous Q12H   Continuous Infusions: . [MAR Hold] sodium chloride    . sodium chloride 10 mL/hr at 12/09/17 1014  . [MAR Hold] ferric gluconate (FERRLECIT/NULECIT) IV 125 mg (12/09/17 0314)   PRN Meds:.[MAR Hold] sodium chloride, 0.9 % irrigation (POUR BTL), [MAR Hold] acetaminophen **OR** [MAR Hold] acetaminophen, heparin irrigation 6000 unit, [MAR Hold] heparin, heparin, heparin, [MAR Hold] hydrALAZINE, [MAR Hold]  HYDROmorphone (DILAUDID) injection, lidocaine (PF), lidocaine-EPINEPHrine, [MAR Hold] ondansetron **OR** [MAR Hold] ondansetron (ZOFRAN) IV, [MAR Hold] sodium chloride flush  Current Labs: reviewed    Ref. Range 12/07/2017 03:45  PTH, Intact Latest Ref Range: 15 - 65 pg/mL 404 (H)    Physical Exam:  Blood pressure (!) 183/73, pulse 89, temperature 99.3 F (37.4 C), resp. rate 18, weight 63.6 kg (140 lb 3.4 oz), SpO2 98 %. Groggy in PACU EOMI NCAT RRR no  rub, nl s1s2 Coarse bs b/l\ Trace LEE +B of R FA AVF TDC present in R IJ, bandaged  A/P 1. New ESRD 1. For [facility] and AVF today 2. CLIP pending 3. HD#3 1/5: Qb 300, 3.5h, 2L UF, use TDC, no heparin, 3K 2. obstructuer L ureteral stone s/p removeal 12/08/17 with urology; ureteral stent to be removed 1/7 3. BMD: Low Ca, inc P; PTH; On Tums + C3 4. Anemia: ESA started + IV Fe, follow 5. HTN: on carvedilol and hydralazine, needs probing for lower post HD weights  P 1. As above   Pearson Grippe MD 12/09/2017, 1:37 PM  Recent Labs  Lab 12/07/17 0345 12/07/17 0920 12/08/17 0419 12/09/17 0207  NA 133* 134* 136 136  K 5.0 4.7 4.0 3.8  CL 107 109 105 103  CO2 10* 11* 18* 23  GLUCOSE 68 83 78 109*  BUN 133* 133* 80* 75*  CREATININE 15.80* 15.89* 11.15* 10.48*  CALCIUM 6.2* 6.2* 6.5* 6.5*  PHOS 9.8*  --   --  5.0*   Recent Labs  Lab 12/07/17 0349 12/08/17 0419 12/09/17 0207  WBC 6.7 6.0 4.7  HGB 6.9* 8.1* 8.0*  HCT 19.9* 23.7* 24.5*  MCV 87.7 87.8 88.8  PLT 212 211 233

## 2017-12-09 NOTE — Progress Notes (Signed)
Pt picked up by transport for hemodialysis.

## 2017-12-09 NOTE — Progress Notes (Signed)
Pt bladder scan showed 700cc. Attempted to reach Urology but had to leave message with office who said they would relay message to Dr. Jeffie Pollock. Paged Dr. Tyrell Antonio to inform of situation. Dr. Tyrell Antonio talked with Dr. Jeffie Pollock who stated that Pt could have Foley placed, even with tether from ureter stent in place. Pt had already went down for surgery for fistula placement. Short stay RN was notified of situation and other care team members in [place] were notified as well. Orders were placed for Foley placement. Pt to get foley catheter placed in OR.

## 2017-12-09 NOTE — Op Note (Signed)
    OPERATIVE REPORT  DATE OF SURGERY: 12/09/2017  PATIENT: Roberth Berling, 57 y.o. male MRN: [id number]  DOB: 1961/05/28  PRE-OPERATIVE DIAGNOSIS: End-stage renal disease  POST-OPERATIVE DIAGNOSIS:  Same  PROCEDURE: #1 replacement of right IJ temporary catheter with a 23 cm tunneled catheter, #2 left radiocephalic AV fistula creation  SURGEON:  Curt Jews, M.D.  PHYSICIAN ASSISTANT: Matt Eveland PA-C  ANESTHESIA: Local with sedation  EBL: Minimal ml  Total I/O In: 400 [I.V.:400] Out: 720 [Urine:700; Blood:20]  BLOOD ADMINISTERED: None  DRAINS: None  SPECIMEN: None  COUNTS CORRECT:  YES  PLAN OF CARE: PACU  PATIENT DISPOSITION:  PACU - hemodynamically stable  PROCEDURE DETAILS: The patient was taken to the operating placed in supine position where the area of the right and left neck and chest were prepped and draped in usual sterile fashion.  Patient had a temporary right IJ catheter this was prepped into the field.  A guidewire was passed through the temporary catheter and the temporary catheter was removed in its entirety.  A dilator and peel-away sheath was passed over the guidewire down to the level of the right atrium.  A 23 cm tunneled catheter was positioned through the peel-away sheath which was removed.  The tips of the catheter were placed in the level of the distal right atrium.  The catheter was brought through a subcutaneous tunnel through a separate stab incision and the 2 lm ports were attached.  Both lumens flushed and aspirated easily and were catheter was secured to the skin with a 3-0 nylon stitch and the entry site was closed with a 4-0 subcuticular Vicryl stitch.  Sterile dressing was applied.  Attention was then turned to the left arm  The left arm was prepped and draped in usual sterile fashion.  The patient had a large cephalic vein easily visualized on the surface.  This did run onto the dorsum of the hand after a branch above the wrist.  An  incision was made over the radial artery and a separate incision was.  The vein was mobilized and was ligated distally and divided and gently dilated.  The vein was of excellent caliber.  The vein was brought under the tunnel between the vein harvest area and the.  The radial artery was occluded proximally distally and was opened with an 11 blade and sent longitudinally with Potts scissors.  The vein was cut to the appropriate length and spatulated and sewn end-to-side to the artery with a running clamps removed and excellent thrill was noted.  Wounds irrigated with saline.  Hemostasis elect cautery.  Wounds were closed with 3-0 Vicryl in the subcutaneous and subcuticular tissue.  Sterile dressing was applied and the patient the patient was transferred to the recovery room in stable condition with chest x-ray pending   Rosetta Posner, M.D., [facility] 12/09/2017 2:04 PM

## 2017-12-09 NOTE — Transfer of Care (Signed)
Immediate Anesthesia Transfer of Care Note  Patient: Fernando Jones  Procedure(s) Performed: ARTERIOVENOUS (AV) FISTULA CREATION (Right ) EXCHANGE  OF TUNNELED  DIALYSIS CATHETER RIGHT INTERNAL JUGULAR. (Right Neck)  Patient Location: PACU  Anesthesia Type:MAC  Level of Consciousness: drowsy and patient cooperative  Airway & Oxygen Therapy: Patient Spontanous Breathing and Patient connected to nasal cannula oxygen  Post-op Assessment: Report given to RN and Post -op Vital signs reviewed and stable  Post vital signs: Reviewed and stable  Last Vitals:  Vitals:   12/09/17 0418 12/09/17 1344  BP: (!) 183/73 (!) 163/69  Pulse: 89 75  Resp: 18 12  Temp: 37.4 C 36.6 C  SpO2: 98% 100%    Last Pain:  Vitals:   12/09/17 0337  TempSrc: Oral  PainSc:       Patients Stated Pain Goal: 3 (88/67/73 7366)  Complications: No apparent anesthesia complications

## 2017-12-10 ENCOUNTER — Encounter (HOSPITAL_COMMUNITY): Payer: Self-pay | Admitting: Vascular Surgery

## 2017-12-10 LAB — RENAL FUNCTION PANEL
ALBUMIN: 2.5 g/dL — AB (ref 3.5–5.0)
Anion gap: 9 (ref 5–15)
BUN: 45 mg/dL — AB (ref 6–20)
CO2: 26 mmol/L (ref 22–32)
CREATININE: 8.47 mg/dL — AB (ref 0.61–1.24)
Calcium: 7.2 mg/dL — ABNORMAL LOW (ref 8.9–10.3)
Chloride: 101 mmol/L (ref 101–111)
GFR calc Af Amer: 7 mL/min — ABNORMAL LOW (ref >60)
GFR, EST NON AFRICAN AMERICAN: 6 mL/min — AB (ref >60)
Glucose, Bld: 113 mg/dL — ABNORMAL HIGH (ref 65–99)
PHOSPHORUS: 4.2 mg/dL (ref 2.5–4.6)
Potassium: 4.1 mmol/L (ref 3.5–5.1)
SODIUM: 136 mmol/L (ref 135–145)

## 2017-12-10 LAB — GLUCOSE, CAPILLARY
GLUCOSE-CAPILLARY: 82 mg/dL (ref 65–99)
GLUCOSE-CAPILLARY: 114 mg/dL — AB (ref 65–99)
GLUCOSE-CAPILLARY: 250 mg/dL — AB (ref 65–99)
Glucose-Capillary: 143 mg/dL — ABNORMAL HIGH (ref 65–99)
Glucose-Capillary: 74 mg/dL (ref 65–99)
Glucose-Capillary: 77 mg/dL (ref 65–99)

## 2017-12-10 LAB — CBC
HEMATOCRIT: 25 % — AB (ref 39.0–52.0)
HEMOGLOBIN: 8 g/dL — AB (ref 13.0–17.0)
MCH: 29 pg (ref 26.0–34.0)
MCHC: 32 g/dL (ref 30.0–36.0)
MCV: 90.6 fL (ref 78.0–100.0)
Platelets: 205 10*3/uL (ref 150–400)
RBC: 2.76 MIL/uL — AB (ref 4.22–5.81)
RDW: 14.1 % (ref 11.5–15.5)
WBC: 8.6 10*3/uL (ref 4.0–10.5)

## 2017-12-10 MED ORDER — SORBITOL 70 % SOLN
30.0000 mL | Freq: Every day | Status: DC | PRN
  Administered 2017-12-12: 30 mL via ORAL
  Filled 2017-12-10 (×2): qty 30

## 2017-12-10 MED ORDER — SENNOSIDES-DOCUSATE SODIUM 8.6-50 MG PO TABS
1.0000 | ORAL_TABLET | Freq: Two times a day (BID) | ORAL | Status: DC
  Administered 2017-12-10 – 2017-12-15 (×12): 1 via ORAL
  Filled 2017-12-10 (×12): qty 1

## 2017-12-10 MED ORDER — CALCITRIOL 0.25 MCG PO CAPS
ORAL_CAPSULE | ORAL | Status: AC
  Filled 2017-12-10: qty 1

## 2017-12-10 MED ORDER — TAMSULOSIN HCL 0.4 MG PO CAPS
0.4000 mg | ORAL_CAPSULE | Freq: Every day | ORAL | Status: DC
  Administered 2017-12-10 – 2017-12-16 (×7): 0.4 mg via ORAL
  Filled 2017-12-10 (×7): qty 1

## 2017-12-10 NOTE — Progress Notes (Signed)
1 Day Post-Op  Subjective: Mr. Reimers was found to be in retention yesterday and a foley was placed with 1033ml UOP yesterday.   He is s/p left ureteroscopy with stent of a left distal stone on weds.  His pain has improved as has the nausea.  ROS:  Review of Systems  Constitutional: Negative for chills and fever.  Skin:       He has a painful wound on the right anterior wrist.     Anti-infectives: Anti-infectives (From admission, onward)   Start     Dose/Rate Route Frequency Ordered Stop   12/09/17 1018  ceFAZolin (ANCEF) 2-4 GM/100ML-% IVPB    Comments:  Hogue, Samantha   : cabinet override      12/09/17 1018 12/09/17 1030   12/09/17 1000  ceFAZolin (ANCEF) IVPB 1 g/50 mL premix  Status:  Discontinued     1 g 100 mL/hr over 30 Minutes Intravenous To [facility] 12/08/17 1210 12/08/17 1938   12/09/17 0000  ceFAZolin (ANCEF) IVPB 1 g/50 mL premix  Status:  Discontinued    Comments:  Send with pt to OR   1 g 100 mL/hr over 30 Minutes Intravenous On call 12/08/17 1200 12/08/17 1216   12/08/17 1558  ceFAZolin (ANCEF) 2-4 GM/100ML-% IVPB    Comments:  Bridget Hartshorn   : cabinet override      12/08/17 1558 12/08/17 1705   12/08/17 1551  ceFAZolin (ANCEF) IVPB 2g/100 mL premix     2 g 200 mL/hr over 30 Minutes Intravenous 30 min pre-op 12/08/17 1552 12/08/17 1720      Current Facility-Administered Medications  Medication Dose Route Frequency Provider Last Rate Last Dose  . 0.9 %  sodium chloride infusion  250 mL Intravenous PRN Oswald Hillock, MD      . 0.9 %  sodium chloride infusion   Intravenous Continuous Annye Asa, MD 10 mL/hr at 12/09/17 1014    . acetaminophen (TYLENOL) tablet 650 mg  650 mg Oral Q6H PRN Oswald Hillock, MD   650 mg at 12/08/17 2108   Or  . acetaminophen (TYLENOL) suppository 650 mg  650 mg Rectal Q6H PRN Oswald Hillock, MD      . aspirin EC tablet 81 mg  81 mg Oral Daily Oswald Hillock, MD   81 mg at 12/08/17 1111  . calcitRIOL (ROCALTROL)  capsule 0.25 mcg  0.25 mcg Oral Q T,Th,Sa-HD Jamal Maes, MD      . calcium carbonate (TUMS - dosed in mg elemental calcium) chewable tablet 800 mg of elemental calcium  4 tablet Oral TID WC Jamal Maes, MD   800 mg of elemental calcium at 12/09/17 1756  . calcium carbonate (TUMS - dosed in mg elemental calcium) chewable tablet 800 mg of elemental calcium  4 tablet Oral TID Jamal Maes, MD   800 mg of elemental calcium at 12/09/17 2108  . carvedilol (COREG) tablet 12.5 mg  12.5 mg Oral BID WC Oswald Hillock, MD   12.5 mg at 12/09/17 1611  . Darbepoetin Alfa (ARANESP) injection 200 mcg  200 mcg Intravenous Q Thu-HD Jamal Maes, MD   200 mcg at 12/09/17 670 120 8463  . ferric gluconate (NULECIT) 125 mg in sodium chloride 0.9 % 100 mL IVPB  125 mg Intravenous Daily Jamal Maes, MD   Stopped at 12/09/17 1757  . heparin injection 1,000 Units  1,000 Units Dialysis PRN Jamal Maes, MD   1,000 Units at 12/07/17 2320  . heparin injection 5,000 Units  5,000 Units Subcutaneous Q8H Monia Sabal, PA-C   5,000 Units at 12/10/17 8563  . heparin injection   Intravenous PRN Sandi Mariscal, MD   2,800 Units at 12/07/17 1125  . hydrALAZINE (APRESOLINE) injection 10 mg  10 mg Intravenous Q4H PRN Oswald Hillock, MD   10 mg at 12/09/17 0427  . hydrALAZINE (APRESOLINE) tablet 25 mg  25 mg Oral Q8H Oswald Hillock, MD   25 mg at 12/10/17 1497  . HYDROmorphone (DILAUDID) injection 0.5-1 mg  0.5-1 mg Intravenous Q4H PRN Caren Griffins, MD   1 mg at 12/08/17 0645  . lidocaine (PF) (XYLOCAINE) 1 % injection    PRN Sandi Mariscal, MD   2 mL at 12/07/17 1121  . ondansetron (ZOFRAN) tablet 4 mg  4 mg Oral Q6H PRN Oswald Hillock, MD       Or  . ondansetron [facility]) injection 4 mg  4 mg Intravenous Q6H PRN Oswald Hillock, MD   4 mg at 12/08/17 0944  . promethazine (PHENERGAN) injection 25 mg  25 mg Intravenous Once Sandi Mariscal, MD      . sodium chloride flush (NS) 0.9 % injection 3 mL  3 mL Intravenous Q12H Oswald Hillock,  MD   3 mL at 12/09/17 2112  . sodium chloride flush (NS) 0.9 % injection 3 mL  3 mL Intravenous PRN Oswald Hillock, MD      . traMADol Veatrice Bourbon) tablet 50 mg  50 mg Oral Q6H PRN Dagoberto Ligas, PA-C         Objective: Vital signs in last 24 hours: Temp:  [97.8 F (36.6 C)-98.7 F (37.1 C)] 98.4 F (36.9 C) (01/05 0518) Pulse Rate:  [66-76] 76 (01/05 0518) Resp:  [10-17] 17 (01/05 0518) BP: (128-179)/(47-71) 179/61 (01/05 0518) SpO2:  [93 %-100 %] 100 % (01/05 0518)  Intake/Output from previous day: 01/04 0701 - 01/05 0700 In: 480 [I.V.:480] Out: 1020 [Urine:1000; Blood:20] Intake/Output this shift: Total I/O In: 80 [I.V.:80] Out: 200 [Urine:200]   Physical Exam  Constitutional: He is well-developed, well-nourished, and in no distress.  Abdominal: Soft. There is tenderness (LUQ but much improved. ).  Genitourinary:  Genitourinary Comments: Foley draining blood tinged urine.   Vitals reviewed.   Lab Results:  Recent Labs    12/09/17 0207 12/10/17 0305  WBC 4.7 8.6  HGB 8.0* 8.0*  HCT 24.5* 25.0*  PLT 233 205   BMET Recent Labs    12/08/17 0419 12/09/17 0207  NA 136 136  K 4.0 3.8  CL 105 103  CO2 18* 23  GLUCOSE 78 109*  BUN 80* 75*  CREATININE 11.15* 10.48*  CALCIUM 6.5* 6.5*   PT/INR No results for input(s): LABPROT, INR in the last 72 hours. ABG No results for input(s): PHART, HCO3 in the last 72 hours.  Invalid input(s): PCO2, PO2  Studies/Results: Dg Chest Port 1 View  Result Date: 12/09/2017 CLINICAL DATA:  Vascular catheter. EXAM: PORTABLE CHEST 1 VIEW COMPARISON:  No recent prior . FINDINGS: Right central line noted with tip over right atrium. Cardiomegaly with mild pulmonary vascular prominence and interstitial prominence. Small bilateral pleural effusions. Findings suggest mild CHF. No pneumothorax. IMPRESSION: 1. Right central line noted with tip over right atrium. 2. Cardiomegaly with mild pulmonary vascular prominence, bilateral  interstitial prominence, small bilateral pleural effusions consistent mild CHF. Electronically Signed   By: Marcello Moores  Register   On: 12/09/2017 14:52   Dg C-arm 1-60 Min-no Report  Result Date: 12/08/2017 Fluoroscopy was  utilized by the requesting physician.  No radiographic interpretation.     Assessment and Plan: 1.  Left ureteral stone s/p ureteroscopy and stenting.  He has decreased pain.  Plan to pull stent on Monday.  2.  Urinary retention.  He is feeling better with the foley and had 1030ml UOP yesterday.  Not recorded for today yet.   Plan for voiding trial on Monday.   Will start tamsulosin 0.4mg  po qday.       LOS: 4 days    Irine Seal 12/10/2017 [phone] ID: Otis Brace, male   DOB: July 01, 1961, 57 y.o.   MRN: [id number]

## 2017-12-10 NOTE — Progress Notes (Signed)
   VASCULAR SURGERY ASSESSMENT & PLAN:   1 Day Post-Op s/p: TDC and right RC AVF  Fistula with a good thrill.  Vascular will be available as needed. We have already arranged f/u.   SUBJECTIVE:   No complaints  PHYSICAL EXAM:   Vitals:   12/09/17 1445 12/09/17 1500 12/09/17 2049 12/10/17 0518  BP: (!) 153/71 (!) 176/65 (!) 128/47 (!) 179/61  Pulse: 70 72 73 76  Resp: 13 15 17 17   Temp: 97.8 F (36.6 C) 97.8 F (36.6 C) 98.7 F (37.1 C) 98.4 F (36.9 C)  TempSrc:  Oral    SpO2: 100% 100% 98% 100%  Weight:    146 lb 13.2 oz (66.6 kg)   Good thrill in right RC AVF. Incisions look fine.   LABS:   Lab Results  Component Value Date   WBC 8.6 12/10/2017   HGB 8.0 (L) 12/10/2017   HCT 25.0 (L) 12/10/2017   MCV 90.6 12/10/2017   PLT 205 12/10/2017   Lab Results  Component Value Date   CREATININE 10.48 (H) 12/09/2017   Lab Results  Component Value Date   INR 1.25 12/07/2017   CBG (last 3)  Recent Labs    12/09/17 2039 12/10/17 0017 12/10/17 0616  GLUCAP 161* 74 77    PROBLEM LIST:    Active Problems:   DM (diabetes mellitus), type 2 with renal complications (HCC)   Essential hypertension   ESRD (end stage renal disease) (HCC)   CURRENT MEDS:   . aspirin EC  81 mg Oral Daily  . calcitRIOL  0.25 mcg Oral Q T,Th,Sa-HD  . calcium carbonate  4 tablet Oral TID WC  . calcium carbonate  4 tablet Oral TID  . carvedilol  12.5 mg Oral BID WC  . darbepoetin (ARANESP) injection - DIALYSIS  200 mcg Intravenous Q Thu-HD  . heparin  5,000 Units Subcutaneous Q8H  . hydrALAZINE  25 mg Oral Q8H  . promethazine  25 mg Intravenous Once  . sodium chloride flush  3 mL Intravenous Q12H  . tamsulosin  0.4 mg Oral Daily    Deitra Mayo Beeper: [phone] Office: [phone] 12/10/2017

## 2017-12-10 NOTE — Progress Notes (Signed)
Admit: 12/06/2017 LOS: 4  70M new ESRD, anemia, obstructing L kidney stone  Subjective:  Tunneled HD cath and L FA AVF placed yesterday For HD today CLIP pending For ureteral stent removal 1/7 No c/o from pt, arm and neck hurt  01/04 0701 - 01/05 0700 In: 480 [I.V.:480] Out: 1020 [Urine:1000; Blood:20]  Filed Weights   12/09/17 0027 12/09/17 0337 12/10/17 0518  Weight: 65.6 kg (144 lb 10 oz) 63.6 kg (140 lb 3.4 oz) 66.6 kg (146 lb 13.2 oz)    Scheduled Meds: . aspirin EC  81 mg Oral Daily  . calcitRIOL  0.25 mcg Oral Q T,Th,Sa-HD  . calcium carbonate  4 tablet Oral TID WC  . calcium carbonate  4 tablet Oral TID  . carvedilol  12.5 mg Oral BID WC  . darbepoetin (ARANESP) injection - DIALYSIS  200 mcg Intravenous Q Thu-HD  . heparin  5,000 Units Subcutaneous Q8H  . hydrALAZINE  25 mg Oral Q8H  . promethazine  25 mg Intravenous Once  . senna-docusate  1 tablet Oral BID  . sodium chloride flush  3 mL Intravenous Q12H  . tamsulosin  0.4 mg Oral Daily   Continuous Infusions: . sodium chloride    . sodium chloride 10 mL/hr at 12/09/17 1014  . ferric gluconate (FERRLECIT/NULECIT) IV Stopped (12/09/17 1757)   PRN Meds:.sodium chloride, acetaminophen **OR** acetaminophen, heparin, heparin, hydrALAZINE, HYDROmorphone (DILAUDID) injection, lidocaine (PF), ondansetron **OR** ondansetron (ZOFRAN) IV, sodium chloride flush, sorbitol, traMADol  Current Labs: reviewed    Ref. Range 12/07/2017 03:45  PTH, Intact Latest Ref Range: 15 - 65 pg/mL 404 (H)    Physical Exam:  Blood pressure (!) 179/61, pulse 76, temperature 98.4 F (36.9 C), resp. rate 17, weight 66.6 kg (146 lb 13.2 oz), SpO2 100 %. NAD EOMI NCAT RRR no rub, nl s1s2 Coarse bs b/l\ Trace LEE +B of R FA AVF TDC present in R IJ, bandaged  A/P 1. New ESRD 1. S/p TDC and AVF 1/4  2. CLIP pending 3. Today HD#3 1/5: Qb 300, 3.5h, 2L UF, use TDC, no heparin, 3K 2. obstructuer L ureteral stone s/p removeal 12/08/17 with  urology; ureteral stent to be removed 1/7 3. BMD: Low Ca, inc P; PTH; On Tums + C3 4. Anemia: ESA started + IV Fe, follow 5. HTN: on carvedilol and hydralazine, needs probing for lower post HD weights  P 1. As above   Pearson Grippe MD 12/10/2017, 11:32 AM  Recent Labs  Lab 12/07/17 0345 12/07/17 0920 12/08/17 0419 12/09/17 0207  NA 133* 134* 136 136  K 5.0 4.7 4.0 3.8  CL 107 109 105 103  CO2 10* 11* 18* 23  GLUCOSE 68 83 78 109*  BUN 133* 133* 80* 75*  CREATININE 15.80* 15.89* 11.15* 10.48*  CALCIUM 6.2* 6.2* 6.5* 6.5*  PHOS 9.8*  --   --  5.0*   Recent Labs  Lab 12/08/17 0419 12/09/17 0207 12/10/17 0305  WBC 6.0 4.7 8.6  HGB 8.1* 8.0* 8.0*  HCT 23.7* 24.5* 25.0*  MCV 87.8 88.8 90.6  PLT 211 233 205

## 2017-12-10 NOTE — Progress Notes (Signed)
PROGRESS NOTE  Fernando Jones QQI:[id number] DOB: 07-Jul-1961 DOA: 12/06/2017 PCP: Patient, No Pcp Per   LOS: 4 days   Brief Narrative / Interim history: 57 y.o. Hispanic male, with history of diabetes mellitus, hypertension, CKD V, who came to hospital with abdominal pain with nausea and vomiting. CT showed mild left sided hydronephrosis and distal ureteral calculus, possible ascending colon wall thickening, bilateral pleural effusions. Labs showed BUN 36, creatinine 15.16.  Nephrology and urology were consulted.  He reports that patient has been noncompliant with his medications for his hypertension as well as diabetes.  Assessment & Plan: Active Problems:   DM (diabetes mellitus), type 2 with renal complications (HCC)   Essential hypertension   ESRD (end stage renal disease) (HCC)  End-stage renal disease, new HD patient -Patient's previous creatinine from 2017 was around 5, on admission 15.17.  He has been noncompliant with his home medications.  BUN is 136, and seems to have symptomatic uremic symptoms with transient episodes of confusion. -Nephrology consulted, patient had dialysis catheter placed, converted to tunneled catheter 1-04.Marland Kitchen He underwent HD 1-02. -will need Clip and permanent access for HD.  Underwent AV fistula 1-04.  HD today   Hypertension -Continue home medications with Coreg and hydralazine.   -might need further adjustment.   Anemia; iron deficiency. Chronic diseases.  Needs out patient colonoscopy.  Needs iron tablet at discharge  On aranesp. Received IV iron.   Left ureteral stone/hydronephrosis:  -Urology consulted. Discussed with Dr Lorrene Reid ok to proceed with cystoscopy 1-03 Un underwent cystoscopy , stone extraction and stent placement 1-03. He report improvement of pain and nausea.  He had 670 urine retention, discussed with Dr Roni Bread ok to place foley catheter,.  Started on flomax, plan t remove stent on Monday   Elevated troponin -0.05>> 0.06>>  0.07, overall flat, not in a pattern consistent with ACS -No chest pain  History of diabetes mellitus -Last hemoglobin A1c from 2017 was 5.3.  Repeat hemoglobin A1c now 4.6, this is to be under control, question whether he truly had diabetes in the past or not.   ?  Colitis -CT scan concerning for thickening of the ascending colon, however patient is asymptomatic -Denies abdominal pain, denies diarrhea.   DVT prophylaxis: heparin Code Status: Full code Family Communication: no family at bedside Disposition Plan: home when ready   Consultants:   Nephrology  Urology  Procedures:   Tunneled HD cath placement 1/2  Antimicrobials:  None  Subjective: He is feeling better, left flank pain improving.  Nausea persist, but is better   Objective: Vitals:   12/09/17 1500 12/09/17 2049 12/10/17 0518 12/10/17 1326  BP: (!) 176/65 (!) 128/47 (!) 179/61 (!) (P) 199/91  Pulse: 72 73 76 (P) 78  Resp: 15 17 17  (P) 18  Temp: 97.8 F (36.6 C) 98.7 F (37.1 C) 98.4 F (36.9 C) (!) (P) 97 F (36.1 C)  TempSrc: Oral   (P) Oral  SpO2: 100% 98% 100% (P) 99%  Weight:   66.6 kg (146 lb 13.2 oz)     Intake/Output Summary (Last 24 hours) at 12/10/2017 1344 Last data filed at 12/10/2017 1017 Gross per 24 hour  Intake 840 ml  Output 300 ml  Net 540 ml   Filed Weights   12/09/17 0027 12/09/17 0337 12/10/17 0518  Weight: 65.6 kg (144 lb 10 oz) 63.6 kg (140 lb 3.4 oz) 66.6 kg (146 lb 13.2 oz)    Examination:  Constitutional: NAD ENMT: MMM Respiratory: CTA Cardiovascular S  1, S 2 RRR Abdomen: BS present, soft, nt Neurologic: alert, non focal.  Extremities; right arm with incision after AV fistula sx.     Data Reviewed: I have independently reviewed following labs and imaging studies   CBC: Recent Labs  Lab 12/06/17 2102 12/07/17 0349 12/08/17 0419 12/09/17 0207 12/10/17 0305  WBC 8.7 6.7 6.0 4.7 8.6  HGB 7.6* 6.9* 8.1* 8.0* 8.0*  HCT 22.3* 19.9* 23.7* 24.5* 25.0*  MCV  87.5 87.7 87.8 88.8 90.6  PLT 252 212 211 233 937   Basic Metabolic Panel: Recent Labs  Lab 12/06/17 1348 12/06/17 2102 12/07/17 0345 12/07/17 0920 12/08/17 0419 12/09/17 0207  NA 135  --  133* 134* 136 136  K 5.0  --  5.0 4.7 4.0 3.8  CL 106  --  107 109 105 103  CO2 12*  --  10* 11* 18* 23  GLUCOSE 93  --  68 83 78 109*  BUN 136*  --  133* 133* 80* 75*  CREATININE 15.16* 15.61* 15.80* 15.89* 11.15* 10.48*  CALCIUM 6.1*  --  6.2* 6.2* 6.5* 6.5*  MG  --   --   --   --  1.8  --   PHOS  --   --  9.8*  --   --  5.0*   GFR: CrCl cannot be calculated (Unknown ideal weight.). Liver Function Tests: Recent Labs  Lab 12/06/17 1348 12/07/17 0345 12/07/17 0920 12/07/17 2100 12/08/17 0419 12/09/17 0207  AST 9*  --  7*  --  10*  --   ALT 10*  --  8* 8* 9*  --   ALKPHOS 65  --  51  --  55  --   BILITOT 0.5  --  0.4  --  0.5  --   PROT 6.8  --  5.0*  --  5.2*  --   ALBUMIN 3.4* 2.7* 2.5*  --  2.5* 2.5*   Recent Labs  Lab 12/06/17 1348  LIPASE 40   No results for input(s): AMMONIA in the last 168 hours. Coagulation Profile: Recent Labs  Lab 12/07/17 0349  INR 1.25   Cardiac Enzymes: Recent Labs  Lab 12/06/17 1348 12/06/17 2102 12/07/17 0349 12/07/17 0920  TROPONINI 0.07* 0.05* 0.06* 0.07*   BNP (last 3 results) No results for input(s): PROBNP in the last 8760 hours. HbA1C: No results for input(s): HGBA1C in the last 72 hours. CBG: Recent Labs  Lab 12/09/17 2039 12/10/17 0017 12/10/17 0616 12/10/17 0750 12/10/17 1221  GLUCAP 161* 74 77 82 114*   Lipid Profile: No results for input(s): CHOL, HDL, LDLCALC, TRIG, CHOLHDL, LDLDIRECT in the last 72 hours. Thyroid Function Tests: No results for input(s): TSH, T4TOTAL, FREET4, T3FREE, THYROIDAB in the last 72 hours. Anemia Panel: No results for input(s): VITAMINB12, FOLATE, FERRITIN, TIBC, IRON, RETICCTPCT in the last 72 hours. Urine analysis:    Component Value Date/Time   COLORURINE YELLOW 12/06/2017  1339   APPEARANCEUR CLEAR 12/06/2017 1339   LABSPEC 1.011 12/06/2017 1339   PHURINE 5.0 12/06/2017 1339   GLUCOSEU 50 (A) 12/06/2017 1339   HGBUR MODERATE (A) 12/06/2017 1339   BILIRUBINUR NEGATIVE 12/06/2017 1339   KETONESUR NEGATIVE 12/06/2017 1339   PROTEINUR >=300 (A) 12/06/2017 1339   NITRITE NEGATIVE 12/06/2017 1339   LEUKOCYTESUR NEGATIVE 12/06/2017 1339   Sepsis Labs: Invalid input(s): PROCALCITONIN, LACTICIDVEN  No results found for this or any previous visit (from the past 240 hour(s)).    Radiology Studies: Dg Chest [facility]  Result Date: 12/09/2017 CLINICAL DATA:  Vascular catheter. EXAM: PORTABLE CHEST 1 VIEW COMPARISON:  No recent prior . FINDINGS: Right central line noted with tip over right atrium. Cardiomegaly with mild pulmonary vascular prominence and interstitial prominence. Small bilateral pleural effusions. Findings suggest mild CHF. No pneumothorax. IMPRESSION: 1. Right central line noted with tip over right atrium. 2. Cardiomegaly with mild pulmonary vascular prominence, bilateral interstitial prominence, small bilateral pleural effusions consistent mild CHF. Electronically Signed   By: Marcello Moores  Register   On: 12/09/2017 14:52   Dg C-arm 1-60 Min-no Report  Result Date: 12/08/2017 Fluoroscopy was utilized by the requesting physician.  No radiographic interpretation.     Scheduled Meds: . aspirin EC  81 mg Oral Daily  . calcitRIOL  0.25 mcg Oral Q T,Th,Sa-HD  . calcium carbonate  4 tablet Oral TID WC  . calcium carbonate  4 tablet Oral TID  . carvedilol  12.5 mg Oral BID WC  . darbepoetin (ARANESP) injection - DIALYSIS  200 mcg Intravenous Q Thu-HD  . heparin  5,000 Units Subcutaneous Q8H  . hydrALAZINE  25 mg Oral Q8H  . promethazine  25 mg Intravenous Once  . senna-docusate  1 tablet Oral BID  . sodium chloride flush  3 mL Intravenous Q12H  . tamsulosin  0.4 mg Oral Daily   Continuous Infusions: . sodium chloride    . sodium chloride 10 mL/hr at  12/09/17 1014  . ferric gluconate (FERRLECIT/NULECIT) IV Stopped (12/09/17 1757)    Niel Hummer, MD [facility] Pager [phone]  If 7PM-7AM, please contact night-coverage [email or web address] Password [email or web address] 12/10/2017, 1:44 PM

## 2017-12-11 ENCOUNTER — Inpatient Hospital Stay (HOSPITAL_COMMUNITY): Payer: Self-pay

## 2017-12-11 LAB — GLUCOSE, CAPILLARY
GLUCOSE-CAPILLARY: 168 mg/dL — AB (ref 65–99)
Glucose-Capillary: 109 mg/dL — ABNORMAL HIGH (ref 65–99)
Glucose-Capillary: 101 mg/dL — ABNORMAL HIGH (ref 65–99)
Glucose-Capillary: 130 mg/dL — ABNORMAL HIGH (ref 65–99)
Glucose-Capillary: 115 mg/dL — ABNORMAL HIGH (ref 65–99)

## 2017-12-11 LAB — CBC
HEMATOCRIT: 26.1 % — AB (ref 39.0–52.0)
Hemoglobin: 8.1 g/dL — ABNORMAL LOW (ref 13.0–17.0)
MCH: 29.1 pg (ref 26.0–34.0)
MCHC: 31 g/dL (ref 30.0–36.0)
MCV: 93.9 fL (ref 78.0–100.0)
Platelets: 226 10*3/uL (ref 150–400)
RBC: 2.78 MIL/uL — ABNORMAL LOW (ref 4.22–5.81)
RDW: 14.1 % (ref 11.5–15.5)
WBC: 9.2 10*3/uL (ref 4.0–10.5)

## 2017-12-11 LAB — TSH: TSH: 1.731 u[IU]/mL (ref 0.350–4.500)

## 2017-12-11 MED ORDER — SORBITOL 70 % SOLN
30.0000 mL | Freq: Once | Status: AC
  Administered 2017-12-11: 30 mL via ORAL
  Filled 2017-12-11: qty 30

## 2017-12-11 MED ORDER — PANTOPRAZOLE SODIUM 40 MG IV SOLR
40.0000 mg | Freq: Two times a day (BID) | INTRAVENOUS | Status: DC
  Administered 2017-12-11 – 2017-12-13 (×5): 40 mg via INTRAVENOUS
  Filled 2017-12-11 (×5): qty 40

## 2017-12-11 NOTE — Plan of Care (Signed)
Progressing

## 2017-12-11 NOTE — Progress Notes (Signed)
Admit: 12/06/2017 LOS: 5  72M new ESRD, anemia, obstructing L kidney stone  Subjective:  HD yesterday, Tx #3, 2.1L UF, tol Tx well, 64kg post HD CLIP not final yet For ureteral stent removal 1/7 No new complaints  01/05 0701 - 01/06 0700 In: 1174.2 [P.O.:720; I.V.:234.2; IV Piggyback:220] Out: 2300 [Urine:200]  Filed Weights   12/10/17 0518 12/10/17 1326 12/10/17 1700  Weight: 66.6 kg (146 lb 13.2 oz) 66 kg (145 lb 8.1 oz) 64 kg (141 lb 1.5 oz)    Scheduled Meds: . aspirin EC  81 mg Oral Daily  . calcitRIOL  0.25 mcg Oral Q T,Th,Sa-HD  . calcium carbonate  4 tablet Oral TID WC  . calcium carbonate  4 tablet Oral TID  . carvedilol  12.5 mg Oral BID WC  . darbepoetin (ARANESP) injection - DIALYSIS  200 mcg Intravenous Q Thu-HD  . heparin  5,000 Units Subcutaneous Q8H  . hydrALAZINE  25 mg Oral Q8H  . promethazine  25 mg Intravenous Once  . senna-docusate  1 tablet Oral BID  . sodium chloride flush  3 mL Intravenous Q12H  . tamsulosin  0.4 mg Oral Daily   Continuous Infusions: . sodium chloride    . sodium chloride 10 mL/hr at 12/09/17 1014   PRN Meds:.sodium chloride, acetaminophen **OR** acetaminophen, heparin, heparin, hydrALAZINE, HYDROmorphone (DILAUDID) injection, lidocaine (PF), ondansetron **OR** ondansetron (ZOFRAN) IV, sodium chloride flush, sorbitol, traMADol  Current Labs: reviewed    Ref. Range 12/07/2017 03:45  PTH, Intact Latest Ref Range: 15 - 65 pg/mL 404 (H)    Physical Exam:  Blood pressure (!) 176/75, pulse 86, temperature 98.7 F (37.1 C), resp. rate 18, weight 64 kg (141 lb 1.5 oz), SpO2 100 %. NAD EOMI NCAT RRR no rub, nl s1s2 Coarse bs b/l\ Trace LEE +B of R FA AVF TDC present in R IJ, bandaged  A/P 1. New ESRD 1. S/p TDC and AVF 1/4  2. CLIP pending 3. S/p 3 incremental HD Tx, now full Tx: Qb 400, 4h, UF to 63kg not to exceed 2.5L, use TDC, no heparin, 2K 2. obstructuer L ureteral stone s/p removeal 12/08/17 with urology; ureteral stent to  be removed 1/7 3. BMD: Low Ca, inc P; PTH; On Tums + C3 4. Anemia: ESA started + IV Fe, follow 5. HTN: on carvedilol and hydralazine, needs probing for lower post HD weights  P 1. HD on Monday 2. Await CLIP   Pearson Grippe MD 12/11/2017, 10:11 AM  Recent Labs  Lab 12/07/17 0345  12/08/17 0419 12/09/17 0207 12/10/17 1325  NA 133*   < > 136 136 136  K 5.0   < > 4.0 3.8 4.1  CL 107   < > 105 103 101  CO2 10*   < > 18* 23 26  GLUCOSE 68   < > 78 109* 113*  BUN 133*   < > 80* 75* 45*  CREATININE 15.80*   < > 11.15* 10.48* 8.47*  CALCIUM 6.2*   < > 6.5* 6.5* 7.2*  PHOS 9.8*  --   --  5.0* 4.2   < > = values in this interval not displayed.   Recent Labs  Lab 12/08/17 0419 12/09/17 0207 12/10/17 0305  WBC 6.0 4.7 8.6  HGB 8.1* 8.0* 8.0*  HCT 23.7* 24.5* 25.0*  MCV 87.8 88.8 90.6  PLT 211 233 205

## 2017-12-11 NOTE — Progress Notes (Addendum)
PROGRESS NOTE  Fernando Jones UUV:[id number] DOB: 1961/11/09 DOA: 12/06/2017 PCP: Patient, No Pcp Per   LOS: 5 days   Brief Narrative / Interim history: 57 y.o. Hispanic male, with history of diabetes mellitus, hypertension, CKD V, who came to hospital with abdominal pain with nausea and vomiting. CT showed mild left sided hydronephrosis and distal ureteral calculus, possible ascending colon wall thickening, bilateral pleural effusions. Labs showed BUN 36, creatinine 15.16.  Nephrology and urology were consulted.  He reports that patient has been noncompliant with his medications for his hypertension as well as diabetes.  Assessment & Plan: Active Problems:   DM (diabetes mellitus), type 2 with renal complications (HCC)   Essential hypertension   ESRD (end stage renal disease) (HCC)  End-stage renal disease, new HD patient -Patient's previous creatinine from 2017 was around 5, on admission 15.17.  He has been noncompliant with his home medications.  BUN is 136, and seems to have symptomatic uremic symptoms with transient episodes of confusion. -Nephrology consulted, patient had dialysis catheter placed, converted to tunneled catheter 1-04.Marland Kitchen He underwent HD 1-02. -awaiting clip Underwent AV fistula 1-04.  HD on Monday   Nausea, vomiting Still having intermittent vomiting.  Abdominal pain has improved.  No BM.  Check KUB.  Start IV protonix.   Weakness; check Vitamin d, was low in the past.  Check Hb.    Hypertension -Continue home medications with Coreg and hydralazine.   -improved controlled.   Anemia; iron deficiency. Chronic diseases.  Needs out patient colonoscopy.  Needs iron tablet at discharge  On aranesp. Received IV iron.  Repeat hb this am. He is feeling weak, tired.   Left ureteral stone/hydronephrosis:  -Urology consulted. Discussed with Dr Lorrene Reid ok to proceed with cystoscopy 1-03 Un underwent cystoscopy , stone extraction and stent placement 1-03. He  report improvement of pain and nausea.  He had 670 urine retention, discussed with Dr Roni Bread ok to place foley catheter,.  Started on flomax, plan t remove stent on Monday    Elevated troponin -0.05>> 0.06>> 0.07, overall flat, not in a pattern consistent with ACS -No chest pain  History of diabetes mellitus -Last hemoglobin A1c from 2017 was 5.3.  Repeat hemoglobin A1c now 4.6, this is to be under control, question whether he truly had diabetes in the past or not.   ?  Colitis -CT scan concerning for thickening of the ascending colon, however patient is asymptomatic -Denies abdominal pain, denies diarrhea.   DVT prophylaxis: heparin Code Status: Full code Family Communication: no family at bedside Disposition Plan: home when ready   Consultants:   Nephrology  Urology  Procedures:   Tunneled HD cath placement 1/2  Antimicrobials:  None  Subjective: Still vomiting intermittent,  Abdominal pain is better, no BM yet.  He feels very weak and tired.    Objective: Vitals:   12/10/17 1700 12/10/17 2244 12/11/17 0724 12/11/17 0827  BP: (!) 182/80 (!) 128/50 (!) 142/66 (!) 176/75  Pulse: 79 73 75 86  Resp: 16 18 18    Temp: 98.4 F (36.9 C) 99.2 F (37.3 C) 98.7 F (37.1 C)   TempSrc: Oral     SpO2: 100% 100% 100% 100%  Weight: 64 kg (141 lb 1.5 oz)       Intake/Output Summary (Last 24 hours) at 12/11/2017 1135 Last data filed at 12/11/2017 0912 Gross per 24 hour  Intake 934.17 ml  Output 2300 ml  Net -1365.83 ml   Filed Weights   12/10/17 0518 12/10/17  1326 12/10/17 1700  Weight: 66.6 kg (146 lb 13.2 oz) 66 kg (145 lb 8.1 oz) 64 kg (141 lb 1.5 oz)    Examination:  Constitutional: NAD ENMT:MMM Respiratory: CTA Cardiovascular S 1, S 2 RRR Abdomen: BS present, soft, nt Neurologic:non focal.  Extremities; right arm with incision after AV fistula sx.     Data Reviewed: I have independently reviewed following labs and imaging studies   CBC: Recent Labs    Lab 12/06/17 2102 12/07/17 0349 12/08/17 0419 12/09/17 0207 12/10/17 0305  WBC 8.7 6.7 6.0 4.7 8.6  HGB 7.6* 6.9* 8.1* 8.0* 8.0*  HCT 22.3* 19.9* 23.7* 24.5* 25.0*  MCV 87.5 87.7 87.8 88.8 90.6  PLT 252 212 211 233 884   Basic Metabolic Panel: Recent Labs  Lab 12/07/17 0345 12/07/17 0920 12/08/17 0419 12/09/17 0207 12/10/17 1325  NA 133* 134* 136 136 136  K 5.0 4.7 4.0 3.8 4.1  CL 107 109 105 103 101  CO2 10* 11* 18* 23 26  GLUCOSE 68 83 78 109* 113*  BUN 133* 133* 80* 75* 45*  CREATININE 15.80* 15.89* 11.15* 10.48* 8.47*  CALCIUM 6.2* 6.2* 6.5* 6.5* 7.2*  MG  --   --  1.8  --   --   PHOS 9.8*  --   --  5.0* 4.2   GFR: CrCl cannot be calculated (Unknown ideal weight.). Liver Function Tests: Recent Labs  Lab 12/06/17 1348 12/07/17 0345 12/07/17 0920 12/07/17 2100 12/08/17 0419 12/09/17 0207 12/10/17 1325  AST 9*  --  7*  --  10*  --   --   ALT 10*  --  8* 8* 9*  --   --   ALKPHOS 65  --  51  --  55  --   --   BILITOT 0.5  --  0.4  --  0.5  --   --   PROT 6.8  --  5.0*  --  5.2*  --   --   ALBUMIN 3.4* 2.7* 2.5*  --  2.5* 2.5* 2.5*   Recent Labs  Lab 12/06/17 1348  LIPASE 40   No results for input(s): AMMONIA in the last 168 hours. Coagulation Profile: Recent Labs  Lab 12/07/17 0349  INR 1.25   Cardiac Enzymes: Recent Labs  Lab 12/06/17 1348 12/06/17 2102 12/07/17 0349 12/07/17 0920  TROPONINI 0.07* 0.05* 0.06* 0.07*   BNP (last 3 results) No results for input(s): PROBNP in the last 8760 hours. HbA1C: No results for input(s): HGBA1C in the last 72 hours. CBG: Recent Labs  Lab 12/10/17 1221 12/10/17 1953 12/11/17 0000 12/11/17 0618 12/11/17 0804  GLUCAP 114* 250* 143* 109* 101*   Lipid Profile: No results for input(s): CHOL, HDL, LDLCALC, TRIG, CHOLHDL, LDLDIRECT in the last 72 hours. Thyroid Function Tests: No results for input(s): TSH, T4TOTAL, FREET4, T3FREE, THYROIDAB in the last 72 hours. Anemia Panel: No results for  input(s): VITAMINB12, FOLATE, FERRITIN, TIBC, IRON, RETICCTPCT in the last 72 hours. Urine analysis:    Component Value Date/Time   COLORURINE YELLOW 12/06/2017 1339   APPEARANCEUR CLEAR 12/06/2017 1339   LABSPEC 1.011 12/06/2017 1339   PHURINE 5.0 12/06/2017 1339   GLUCOSEU 50 (A) 12/06/2017 1339   HGBUR MODERATE (A) 12/06/2017 1339   BILIRUBINUR NEGATIVE 12/06/2017 1339   KETONESUR NEGATIVE 12/06/2017 1339   PROTEINUR >=300 (A) 12/06/2017 1339   NITRITE NEGATIVE 12/06/2017 1339   LEUKOCYTESUR NEGATIVE 12/06/2017 1339   Sepsis Labs: Invalid input(s): PROCALCITONIN, LACTICIDVEN  No results found for  this or any previous visit (from the past 240 hour(s)).    Radiology Studies: Dg Chest Port 1 View  Result Date: 12/09/2017 CLINICAL DATA:  Vascular catheter. EXAM: PORTABLE CHEST 1 VIEW COMPARISON:  No recent prior . FINDINGS: Right central line noted with tip over right atrium. Cardiomegaly with mild pulmonary vascular prominence and interstitial prominence. Small bilateral pleural effusions. Findings suggest mild CHF. No pneumothorax. IMPRESSION: 1. Right central line noted with tip over right atrium. 2. Cardiomegaly with mild pulmonary vascular prominence, bilateral interstitial prominence, small bilateral pleural effusions consistent mild CHF. Electronically Signed   By: Marcello Moores  Register   On: 12/09/2017 14:52     Scheduled Meds: . aspirin EC  81 mg Oral Daily  . calcitRIOL  0.25 mcg Oral Q T,Th,Sa-HD  . calcium carbonate  4 tablet Oral TID WC  . calcium carbonate  4 tablet Oral TID  . carvedilol  12.5 mg Oral BID WC  . darbepoetin (ARANESP) injection - DIALYSIS  200 mcg Intravenous Q Thu-HD  . heparin  5,000 Units Subcutaneous Q8H  . hydrALAZINE  25 mg Oral Q8H  . promethazine  25 mg Intravenous Once  . senna-docusate  1 tablet Oral BID  . sodium chloride flush  3 mL Intravenous Q12H  . tamsulosin  0.4 mg Oral Daily   Continuous Infusions: . sodium chloride    . sodium  chloride 10 mL/hr at 12/09/17 [place], MD [facility] Pager [phone]  If 7PM-7AM, please contact night-coverage [email or web address] Password [email or web address] 12/11/2017, 11:35 AM

## 2017-12-12 LAB — RENAL FUNCTION PANEL
Albumin: 2.3 g/dL — ABNORMAL LOW (ref 3.5–5.0)
Anion gap: 11 (ref 5–15)
BUN: 30 mg/dL — AB (ref 6–20)
CHLORIDE: 97 mmol/L — AB (ref 101–111)
CO2: 27 mmol/L (ref 22–32)
Calcium: 7.9 mg/dL — ABNORMAL LOW (ref 8.9–10.3)
Creatinine, Ser: 6.76 mg/dL — ABNORMAL HIGH (ref 0.61–1.24)
GFR calc Af Amer: 9 mL/min — ABNORMAL LOW (ref >60)
GFR calc non Af Amer: 8 mL/min — ABNORMAL LOW (ref >60)
GLUCOSE: 99 mg/dL (ref 65–99)
POTASSIUM: 3.8 mmol/L (ref 3.5–5.1)
Phosphorus: 2.7 mg/dL (ref 2.5–4.6)
Sodium: 135 mmol/L (ref 135–145)

## 2017-12-12 LAB — GLUCOSE, CAPILLARY
GLUCOSE-CAPILLARY: 115 mg/dL — AB (ref 65–99)
GLUCOSE-CAPILLARY: 137 mg/dL — AB (ref 65–99)
Glucose-Capillary: 95 mg/dL (ref 65–99)
Glucose-Capillary: 129 mg/dL — ABNORMAL HIGH (ref 65–99)
Glucose-Capillary: 181 mg/dL — ABNORMAL HIGH (ref 65–99)

## 2017-12-12 LAB — VITAMIN D 25 HYDROXY (VIT D DEFICIENCY, FRACTURES): VIT D 25 HYDROXY: 4.5 ng/mL — AB (ref 30.0–100.0)

## 2017-12-12 MED ORDER — DOXERCALCIFEROL 4 MCG/2ML IV SOLN
2.0000 ug | INTRAVENOUS | Status: DC
  Administered 2017-12-16: 2 ug via INTRAVENOUS
  Filled 2017-12-12 (×2): qty 2

## 2017-12-12 MED ORDER — DOXERCALCIFEROL 4 MCG/2ML IV SOLN
INTRAVENOUS | Status: AC
  Administered 2017-12-12: 2 ug
  Filled 2017-12-12: qty 2

## 2017-12-12 NOTE — Progress Notes (Signed)
[facility] ROUNDING NOTE   Subjective:    79M new ESRD, anemia, obstructing L kidney stone  Patient is seen on dialysis Tx #4  CLIP     Objective:  Vital signs in last 24 hours:  Temp:  [98.4 F (36.9 C)-99 F (37.2 C)] 98.4 F (36.9 C) (01/07 0729) Pulse Rate:  [75-84] 80 (01/07 0930) Resp:  [16-18] 18 (01/07 0805) BP: (134-196)/(61-89) 196/87 (01/07 0930) SpO2:  [97 %-99 %] 99 % (01/07 0729) Weight:  [143 lb 1.3 oz (64.9 kg)] 143 lb 1.3 oz (64.9 kg) (01/07 0729)  Weight change:  Filed Weights   12/10/17 1326 12/10/17 1700 12/12/17 0729  Weight: 145 lb 8.1 oz (66 kg) 141 lb 1.5 oz (64 kg) 143 lb 1.3 oz (64.9 kg)    Intake/Output: I/O last 3 completed shifts: In: 872 [P.O.:480; I.V.:392] Out: 400 [Urine:400]   Intake/Output this shift:  No intake/output data recorded.  RRR no rub, nl s1s2 Coarse bs b/l\ Trace LEE +B of R FA AVF TDC present in R IJ, bandaged     Basic Metabolic Panel: Recent Labs  Lab 12/07/17 0345 12/07/17 0920 12/08/17 0419 12/09/17 0207 12/10/17 1325 12/12/17 0730  NA 133* 134* 136 136 136 135  K 5.0 4.7 4.0 3.8 4.1 3.8  CL 107 109 105 103 101 97*  CO2 10* 11* 18* 23 26 27   GLUCOSE 68 83 78 109* 113* 99  BUN 133* 133* 80* 75* 45* 30*  CREATININE 15.80* 15.89* 11.15* 10.48* 8.47* 6.76*  CALCIUM 6.2* 6.2* 6.5* 6.5* 7.2* 7.9*  MG  --   --  1.8  --   --   --   PHOS 9.8*  --   --  5.0* 4.2 2.7    Liver Function Tests: Recent Labs  Lab 12/06/17 1348  12/07/17 0920 12/07/17 2100 12/08/17 0419 12/09/17 0207 12/10/17 1325 12/12/17 0730  AST 9*  --  7*  --  10*  --   --   --   ALT 10*  --  8* 8* 9*  --   --   --   ALKPHOS 65  --  51  --  55  --   --   --   BILITOT 0.5  --  0.4  --  0.5  --   --   --   PROT 6.8  --  5.0*  --  5.2*  --   --   --   ALBUMIN 3.4*   < > 2.5*  --  2.5* 2.5* 2.5* 2.3*   < > = values in this interval not displayed.   Recent Labs  Lab 12/06/17 1348  LIPASE 40   No results for  input(s): AMMONIA in the last 168 hours.  CBC: Recent Labs  Lab 12/07/17 0349 12/08/17 0419 12/09/17 0207 12/10/17 0305 12/11/17 1147  WBC 6.7 6.0 4.7 8.6 9.2  HGB 6.9* 8.1* 8.0* 8.0* 8.1*  HCT 19.9* 23.7* 24.5* 25.0* 26.1*  MCV 87.7 87.8 88.8 90.6 93.9  PLT 212 211 233 205 226    Cardiac Enzymes: Recent Labs  Lab 12/06/17 1348 12/06/17 2102 12/07/17 0349 12/07/17 0920  TROPONINI 0.07* 0.05* 0.06* 0.07*    BNP: Invalid input(s): POCBNP  CBG: Recent Labs  Lab 12/11/17 1210 12/11/17 1559 12/11/17 2015 12/12/17 0128 12/12/17 0503  GLUCAP 130* 115* 168* 115* 95    Microbiology: No results found for this or any previous visit.  Coagulation Studies: No results for input(s): LABPROT, INR in the  last 72 hours.  Urinalysis: No results for input(s): COLORURINE, LABSPEC, PHURINE, GLUCOSEU, HGBUR, BILIRUBINUR, KETONESUR, PROTEINUR, UROBILINOGEN, NITRITE, LEUKOCYTESUR in the last 72 hours.  Invalid input(s): APPERANCEUR    Imaging: Dg Abd Portable 1v  Result Date: 12/11/2017 CLINICAL DATA:  Vomiting for couple days. EXAM: PORTABLE ABDOMEN - 1 VIEW COMPARISON:  CT abdomen/ pelvis 12/06/2017 FINDINGS: There is a large amount of stool in the ascending colon. There is a moderate amount of stool in the transverse and descending colon. There is no bowel dilatation to suggest obstruction. There is no evidence of pneumoperitoneum, portal venous gas or pneumatosis. There is a left nephroureteral stent present. The osseous structures are unremarkable. IMPRESSION: 1. Moderate -large amount of stool throughout the colon most severe in the ascending colon. 2. Left nephroureteral stent. Electronically Signed   By: Kathreen Devoid   On: 12/11/2017 12:37     Medications:   . sodium chloride    . sodium chloride 10 mL/hr at 12/09/17 1014   . aspirin EC  81 mg Oral Daily  . calcitRIOL  0.25 mcg Oral Q T,Th,Sa-HD  . calcium carbonate  4 tablet Oral TID WC  . calcium carbonate  4  tablet Oral TID  . carvedilol  12.5 mg Oral BID WC  . darbepoetin (ARANESP) injection - DIALYSIS  200 mcg Intravenous Q Thu-HD  . heparin  5,000 Units Subcutaneous Q8H  . hydrALAZINE  25 mg Oral Q8H  . pantoprazole (PROTONIX) IV  40 mg Intravenous Q12H  . promethazine  25 mg Intravenous Once  . senna-docusate  1 tablet Oral BID  . sodium chloride flush  3 mL Intravenous Q12H  . tamsulosin  0.4 mg Oral Daily   sodium chloride, acetaminophen **OR** acetaminophen, heparin, heparin, hydrALAZINE, HYDROmorphone (DILAUDID) injection, lidocaine (PF), ondansetron **OR** ondansetron (ZOFRAN) IV, sodium chloride flush, sorbitol, traMADol  Assessment/ Plan:  1. New ESRD 1. S/p TDC and AVF 1/4  2. CLIP pending 3. S/p 4 incremental HD Tx, now full Tx: Qb 400, 4h, UF to 63kg not to exceed 2.5L, use TDC, no heparin, 2K   2. obstructuer L ureteral stone s/p removeal 12/08/17 with urology; ureteral stent to be removed 1/7 3. BMD: Low Ca, inc P; PTH 404 1/2  On Tums + C3  Start hectoral 68mcg q HD 4. Anemia: ESA started + IV Fe, follow 5. HTN: on carvedilol and hydralazine, needs probing for lower post HD weights        LOS: 6 Jorge Amparo W @TODAY @9 :51 AM

## 2017-12-12 NOTE — Care Management Note (Signed)
Case Management Note  Patient Details  Name: Fernando Jones MRN: [id number] Date of Birth: [birth year]  Subjective/Objective:     ESRD- new HD, waiting CLIP process to be complete, DM, HTN             Action/Plan: Discharge Planning:  NCM spoke to pt at bedside. Explained waiting financial clearance for HD. He lives alone and works part-time. He will be able to transport himself to dialysis treatments. Will utilize [facility] (attending please print dc Rx) for $3 copay for meds with once a year use. Made pt aware. Will arrange hospital follow up appt at [facility] once CLIP process complete. (unsure of dc date)    Expected Discharge Date:                 Expected Discharge Plan:  Home/Self Care  In-House Referral:  Clinical Social Work  Discharge planning Services  CM Consult, Follow-up appt scheduled, Medication Assistance, [place] [facility]  Post Acute Care Choice:  NA Choice offered to:  NA  DME Arranged:  N/A DME Agency:  NA  HH Arranged:  NA HH Agency:  NA  Status of Service:  In process, will continue to follow  If discussed at Long Length of Stay Meetings, dates discussed:    Additional Comments:  Erenest Rasher, RN 12/12/2017, 3:59 PM

## 2017-12-12 NOTE — Progress Notes (Signed)
PROGRESS NOTE  Fernando Jones OJJ:[id number] DOB: November 11, 1961 DOA: 12/06/2017 PCP: Patient, No Pcp Per   LOS: 6 days   Brief Narrative / Interim history: 57 y.o. Hispanic male, with history of diabetes mellitus, hypertension, CKD V, who came to hospital with abdominal pain with nausea and vomiting. CT showed mild left sided hydronephrosis and distal ureteral calculus, possible ascending colon wall thickening, bilateral pleural effusions. Labs showed BUN 36, creatinine 15.16.  Nephrology and urology were consulted.  He reports that patient has been noncompliant with his medications for his hypertension as well as diabetes.  Assessment & Plan: Active Problems:   DM (diabetes mellitus), type 2 with renal complications (HCC)   Essential hypertension   ESRD (end stage renal disease) (HCC)  End-stage renal disease, new HD patient -Patient's previous creatinine from 2017 was around 5, on admission 15.17.  He has been noncompliant with his home medications.  BUN is 136, and seems to have symptomatic uremic symptoms with transient episodes of confusion. -Nephrology consulted, patient had dialysis catheter placed, converted to tunneled catheter 1-04.Marland Kitchen He underwent HD 1-02. -awaiting clip Underwent AV fistula 1-04.  Continue with hemodialysis   Nausea, vomiting Still having intermittent vomiting.  Abdominal pain has improved.  No BM.  KUB; finding with constipation. Tap water enema ordered.  PPI  Weakness;  Vitamin D low. Started on hecterol with HD>  Hb stable.    Hypertension -Continue home medications with Coreg and hydralazine.   -improved controlled.   Anemia; iron deficiency. Chronic diseases.  Needs out patient colonoscopy.  Needs iron tablet at discharge  On aranesp. Received IV iron.  Hb stable at 8  Left ureteral stone/hydronephrosis:  -Urology consulted. Discussed with Dr Lorrene Reid ok to proceed with cystoscopy 1-03 Un underwent cystoscopy , stone extraction and stent  placement 1-03. He report improvement of pain and nausea.  Develops urine retention. Had foley catheter placed.  Started on flomax.  Had stent and foley catheter removed 1-07.    Elevated troponin -0.05>> 0.06>> 0.07, overall flat, not in a pattern consistent with ACS -No chest pain  History of diabetes mellitus -Last hemoglobin A1c from 2017 was 5.3.  Repeat hemoglobin A1c now 4.6, this is to be under control, question whether he truly had diabetes in the past or not.   ?  Colitis -CT scan concerning for thickening of the ascending colon, however patient is asymptomatic -Denies abdominal pain, denies diarrhea.   DVT prophylaxis: heparin Code Status: Full code Family Communication: no family at bedside Disposition Plan: home when ready   Consultants:   Nephrology  Urology  Procedures:   Tunneled HD cath placement 1/2  Antimicrobials:  None  Subjective: He report improvement of nausea. No BM yet.    Objective: Vitals:   12/12/17 1030 12/12/17 1100 12/12/17 1129 12/12/17 1449  BP: (!) 160/70 (!) 145/67 (!) 155/69 (!) 156/58  Pulse: 78 77 86 90  Resp:   15 16  Temp:   98.8 F (37.1 C) 98.4 F (36.9 C)  TempSrc:   Oral Oral  SpO2:   99% 98%  Weight:   63.3 kg (139 lb 8.8 oz)     Intake/Output Summary (Last 24 hours) at 12/12/2017 1527 Last data filed at 12/12/2017 1300 Gross per 24 hour  Intake 532 ml  Output 2500 ml  Net -1968 ml   Filed Weights   12/10/17 1700 12/12/17 0729 12/12/17 1129  Weight: 64 kg (141 lb 1.5 oz) 64.9 kg (143 lb 1.3 oz) 63.3 kg (139  lb 8.8 oz)    Examination:  Constitutional: NAD Respiratory: CTA Cardiovascular ; S 1, S 2 RRR Abdomen: BS present, soft, nt Neurologic non focal.  Extremities; right arm with incision after AV fistula sx.     Data Reviewed: I have independently reviewed following labs and imaging studies   CBC: Recent Labs  Lab 12/07/17 0349 12/08/17 0419 12/09/17 0207 12/10/17 0305 12/11/17 1147  WBC  6.7 6.0 4.7 8.6 9.2  HGB 6.9* 8.1* 8.0* 8.0* 8.1*  HCT 19.9* 23.7* 24.5* 25.0* 26.1*  MCV 87.7 87.8 88.8 90.6 93.9  PLT 212 211 233 205 016   Basic Metabolic Panel: Recent Labs  Lab 12/07/17 0345 12/07/17 0920 12/08/17 0419 12/09/17 0207 12/10/17 1325 12/12/17 0730  NA 133* 134* 136 136 136 135  K 5.0 4.7 4.0 3.8 4.1 3.8  CL 107 109 105 103 101 97*  CO2 10* 11* 18* 23 26 27   GLUCOSE 68 83 78 109* 113* 99  BUN 133* 133* 80* 75* 45* 30*  CREATININE 15.80* 15.89* 11.15* 10.48* 8.47* 6.76*  CALCIUM 6.2* 6.2* 6.5* 6.5* 7.2* 7.9*  MG  --   --  1.8  --   --   --   PHOS 9.8*  --   --  5.0* 4.2 2.7   GFR: CrCl cannot be calculated (Unknown ideal weight.). Liver Function Tests: Recent Labs  Lab 12/06/17 1348  12/07/17 0920 12/07/17 2100 12/08/17 0419 12/09/17 0207 12/10/17 1325 12/12/17 0730  AST 9*  --  7*  --  10*  --   --   --   ALT 10*  --  8* 8* 9*  --   --   --   ALKPHOS 65  --  51  --  55  --   --   --   BILITOT 0.5  --  0.4  --  0.5  --   --   --   PROT 6.8  --  5.0*  --  5.2*  --   --   --   ALBUMIN 3.4*   < > 2.5*  --  2.5* 2.5* 2.5* 2.3*   < > = values in this interval not displayed.   Recent Labs  Lab 12/06/17 1348  LIPASE 40   No results for input(s): AMMONIA in the last 168 hours. Coagulation Profile: Recent Labs  Lab 12/07/17 0349  INR 1.25   Cardiac Enzymes: Recent Labs  Lab 12/06/17 1348 12/06/17 2102 12/07/17 0349 12/07/17 0920  TROPONINI 0.07* 0.05* 0.06* 0.07*   BNP (last 3 results) No results for input(s): PROBNP in the last 8760 hours. HbA1C: No results for input(s): HGBA1C in the last 72 hours. CBG: Recent Labs  Lab 12/11/17 1559 12/11/17 2015 12/12/17 0128 12/12/17 0503 12/12/17 1226  GLUCAP 115* 168* 115* 95 137*   Lipid Profile: No results for input(s): CHOL, HDL, LDLCALC, TRIG, CHOLHDL, LDLDIRECT in the last 72 hours. Thyroid Function Tests: Recent Labs    12/11/17 1147  TSH 1.731   Anemia Panel: No results for  input(s): VITAMINB12, FOLATE, FERRITIN, TIBC, IRON, RETICCTPCT in the last 72 hours. Urine analysis:    Component Value Date/Time   COLORURINE YELLOW 12/06/2017 1339   APPEARANCEUR CLEAR 12/06/2017 1339   LABSPEC 1.011 12/06/2017 1339   PHURINE 5.0 12/06/2017 1339   GLUCOSEU 50 (A) 12/06/2017 1339   HGBUR MODERATE (A) 12/06/2017 1339   BILIRUBINUR NEGATIVE 12/06/2017 1339   KETONESUR NEGATIVE 12/06/2017 1339   PROTEINUR >=300 (A) 12/06/2017 1339  NITRITE NEGATIVE 12/06/2017 1339   LEUKOCYTESUR NEGATIVE 12/06/2017 1339   Sepsis Labs: Invalid input(s): PROCALCITONIN, LACTICIDVEN  No results found for this or any previous visit (from the past 240 hour(s)).    Radiology Studies: Dg Abd Portable 1v  Result Date: 12/11/2017 CLINICAL DATA:  Vomiting for couple days. EXAM: PORTABLE ABDOMEN - 1 VIEW COMPARISON:  CT abdomen/ pelvis 12/06/2017 FINDINGS: There is a large amount of stool in the ascending colon. There is a moderate amount of stool in the transverse and descending colon. There is no bowel dilatation to suggest obstruction. There is no evidence of pneumoperitoneum, portal venous gas or pneumatosis. There is a left nephroureteral stent present. The osseous structures are unremarkable. IMPRESSION: 1. Moderate -large amount of stool throughout the colon most severe in the ascending colon. 2. Left nephroureteral stent. Electronically Signed   By: Kathreen Devoid   On: 12/11/2017 12:37     Scheduled Meds: . aspirin EC  81 mg Oral Daily  . calcium carbonate  4 tablet Oral TID WC  . calcium carbonate  4 tablet Oral TID  . carvedilol  12.5 mg Oral BID WC  . darbepoetin (ARANESP) injection - DIALYSIS  200 mcg Intravenous Q Thu-HD  . doxercalciferol  2 mcg Intravenous Q M,W,F-HD  . heparin  5,000 Units Subcutaneous Q8H  . hydrALAZINE  25 mg Oral Q8H  . pantoprazole (PROTONIX) IV  40 mg Intravenous Q12H  . senna-docusate  1 tablet Oral BID  . sodium chloride flush  3 mL Intravenous Q12H    . tamsulosin  0.4 mg Oral Daily   Continuous Infusions: . sodium chloride    . sodium chloride 10 mL/hr at 12/09/17 [place], MD [facility] Pager [phone]  If 7PM-7AM, please contact night-coverage [email or web address] Password [facility] 12/12/2017, 3:27 PM

## 2017-12-12 NOTE — Progress Notes (Signed)
3 Days Post-Op  Subjective: He is POD #4 from left ureteroscopy and stent for a distal stone complicated by urinary retention.  doing better with reduced pain and nausea.   UOP 478ml yesterday.  I started him on tamsulosin Saturday.   ROS:  ROS  Anti-infectives: Anti-infectives (From admission, onward)   Start     Dose/Rate Route Frequency Ordered Stop   12/09/17 1018  ceFAZolin (ANCEF) 2-4 GM/100ML-% IVPB    Comments:  Hogue, Samantha   : cabinet override      12/09/17 1018 12/09/17 1030   12/09/17 1000  ceFAZolin (ANCEF) IVPB 1 g/50 mL premix  Status:  Discontinued     1 g 100 mL/hr over 30 Minutes Intravenous To [facility] 12/08/17 1210 12/08/17 1938   12/09/17 0000  ceFAZolin (ANCEF) IVPB 1 g/50 mL premix  Status:  Discontinued    Comments:  Send with pt to OR   1 g 100 mL/hr over 30 Minutes Intravenous On call 12/08/17 1200 12/08/17 1216   12/08/17 1558  ceFAZolin (ANCEF) 2-4 GM/100ML-% IVPB    Comments:  Bridget Hartshorn   : cabinet override      12/08/17 1558 12/08/17 1705   12/08/17 1551  ceFAZolin (ANCEF) IVPB 2g/100 mL premix     2 g 200 mL/hr over 30 Minutes Intravenous 30 min pre-op 12/08/17 1552 12/08/17 1720      Current Facility-Administered Medications  Medication Dose Route Frequency Provider Last Rate Last Dose  . 0.9 %  sodium chloride infusion  250 mL Intravenous PRN Oswald Hillock, MD      . 0.9 %  sodium chloride infusion   Intravenous Continuous Annye Asa, MD 10 mL/hr at 12/09/17 1014    . acetaminophen (TYLENOL) tablet 650 mg  650 mg Oral Q6H PRN Oswald Hillock, MD   650 mg at 12/08/17 2108   Or  . acetaminophen (TYLENOL) suppository 650 mg  650 mg Rectal Q6H PRN Oswald Hillock, MD      . aspirin EC tablet 81 mg  81 mg Oral Daily Oswald Hillock, MD   81 mg at 12/11/17 1019  . calcitRIOL (ROCALTROL) capsule 0.25 mcg  0.25 mcg Oral Q T,Th,Sa-HD Jamal Maes, MD   0.25 mcg at 12/10/17 1733  . calcium carbonate (TUMS - dosed in mg elemental  calcium) chewable tablet 800 mg of elemental calcium  4 tablet Oral TID WC Jamal Maes, MD   800 mg of elemental calcium at 12/11/17 1723  . calcium carbonate (TUMS - dosed in mg elemental calcium) chewable tablet 800 mg of elemental calcium  4 tablet Oral TID Jamal Maes, MD   800 mg of elemental calcium at 12/11/17 2147  . carvedilol (COREG) tablet 12.5 mg  12.5 mg Oral BID WC Oswald Hillock, MD   12.5 mg at 12/11/17 1723  . Darbepoetin Alfa (ARANESP) injection 200 mcg  200 mcg Intravenous Q Thu-HD Jamal Maes, MD   200 mcg at 12/09/17 6440  . heparin injection 1,000 Units  1,000 Units Dialysis PRN Jamal Maes, MD   1,000 Units at 12/07/17 2320  . heparin injection 5,000 Units  5,000 Units Subcutaneous Q8H Monia Sabal, PA-C   5,000 Units at 12/11/17 2147  . heparin injection   Intravenous PRN Sandi Mariscal, MD   2,800 Units at 12/07/17 1125  . hydrALAZINE (APRESOLINE) injection 10 mg  10 mg Intravenous Q4H PRN Oswald Hillock, MD   10 mg at 12/09/17 0427  . hydrALAZINE (APRESOLINE) tablet  25 mg  25 mg Oral Q8H Oswald Hillock, MD   25 mg at 12/11/17 2147  . HYDROmorphone (DILAUDID) injection 0.5-1 mg  0.5-1 mg Intravenous Q4H PRN Caren Griffins, MD   1 mg at 12/11/17 2147  . lidocaine (PF) (XYLOCAINE) 1 % injection    PRN Sandi Mariscal, MD   2 mL at 12/07/17 1121  . ondansetron (ZOFRAN) tablet 4 mg  4 mg Oral Q6H PRN Oswald Hillock, MD       Or  . ondansetron [facility]) injection 4 mg  4 mg Intravenous Q6H PRN Oswald Hillock, MD   4 mg at 12/08/17 0944  . pantoprazole (PROTONIX) injection 40 mg  40 mg Intravenous Q12H Regalado, Belkys A, MD   40 mg at 12/11/17 2148  . promethazine (PHENERGAN) injection 25 mg  25 mg Intravenous Once Sandi Mariscal, MD      . senna-docusate (Senokot-S) tablet 1 tablet  1 tablet Oral BID Regalado, Belkys A, MD   1 tablet at 12/11/17 2149  . sodium chloride flush (NS) 0.9 % injection 3 mL  3 mL Intravenous Q12H Oswald Hillock, MD   3 mL at 12/11/17 1021  .  sodium chloride flush (NS) 0.9 % injection 3 mL  3 mL Intravenous PRN [place], Marge Duncans, MD      . sorbitol 70 % solution 30 mL  30 mL Oral Daily PRN Regalado, Belkys A, MD      . tamsulosin (FLOMAX) capsule 0.4 mg  0.4 mg Oral Daily Irine Seal, MD   0.4 mg at 12/11/17 1019  . traMADol (ULTRAM) tablet 50 mg  50 mg Oral Q6H PRN Dagoberto Ligas, PA-C         Objective: Vital signs in last 24 hours: Temp:  [98.4 F (36.9 C)-99 F (37.2 C)] 99 F (37.2 C) (01/07 0509) Pulse Rate:  [75-86] 79 (01/07 0509) Resp:  [16-18] 18 (01/07 0509) BP: (134-180)/(61-76) 154/68 (01/07 0509) SpO2:  [97 %-100 %] 98 % (01/07 0509)  Intake/Output from previous day: 01/06 0701 - 01/07 0700 In: 603.5 [P.O.:360; I.V.:243.5] Out: 400 [Urine:400] Intake/Output this shift: Total I/O In: 262 [P.O.:120; I.V.:142] Out: 150 [Urine:150]   Physical Exam  Lab Results:  Recent Labs    12/10/17 0305 12/11/17 1147  WBC 8.6 9.2  HGB 8.0* 8.1*  HCT 25.0* 26.1*  PLT 205 226   BMET Recent Labs    12/10/17 1325  NA 136  K 4.1  CL 101  CO2 26  GLUCOSE 113*  BUN 45*  CREATININE 8.47*  CALCIUM 7.2*   PT/INR No results for input(s): LABPROT, INR in the last 72 hours. ABG No results for input(s): PHART, HCO3 in the last 72 hours.  Invalid input(s): PCO2, PO2  Studies/Results: Dg Abd Portable 1v  Result Date: 12/11/2017 CLINICAL DATA:  Vomiting for couple days. EXAM: PORTABLE ABDOMEN - 1 VIEW COMPARISON:  CT abdomen/ pelvis 12/06/2017 FINDINGS: There is a large amount of stool in the ascending colon. There is a moderate amount of stool in the transverse and descending colon. There is no bowel dilatation to suggest obstruction. There is no evidence of pneumoperitoneum, portal venous gas or pneumatosis. There is a left nephroureteral stent present. The osseous structures are unremarkable. IMPRESSION: 1. Moderate -large amount of stool throughout the colon most severe in the ascending colon. 2. Left  nephroureteral stent. Electronically Signed   By: Kathreen Devoid   On: 12/11/2017 12:37     Assessment and Plan: Doing  well s/p left ureteroscopy and stent with postop retention.   Foley and stent removed today.   Reinsert foley prn.       LOS: 6 days    Irine Seal 12/12/2017 [phone] ID: Otis Brace, male   DOB: November 25, 1961, 57 y.o.   MRN: [id number]

## 2017-12-13 LAB — GLUCOSE, CAPILLARY
GLUCOSE-CAPILLARY: 117 mg/dL — AB (ref 65–99)
GLUCOSE-CAPILLARY: 165 mg/dL — AB (ref 65–99)
GLUCOSE-CAPILLARY: 86 mg/dL (ref 65–99)
Glucose-Capillary: 87 mg/dL (ref 65–99)
Glucose-Capillary: 125 mg/dL — ABNORMAL HIGH (ref 65–99)
Glucose-Capillary: 132 mg/dL — ABNORMAL HIGH (ref 65–99)

## 2017-12-13 MED ORDER — SORBITOL 70 % SOLN
30.0000 mL | Freq: Once | Status: AC
  Administered 2017-12-13: 30 mL via ORAL
  Filled 2017-12-13: qty 30

## 2017-12-13 MED ORDER — PANTOPRAZOLE SODIUM 40 MG PO TBEC
40.0000 mg | DELAYED_RELEASE_TABLET | Freq: Two times a day (BID) | ORAL | Status: DC
Start: 2017-12-13 — End: 2017-12-17
  Administered 2017-12-13 – 2017-12-16 (×6): 40 mg via ORAL
  Filled 2017-12-13 (×6): qty 1

## 2017-12-13 NOTE — Progress Notes (Signed)
The patient is receiving Protonix by the intravenous route.  Based on criteria approved by the Pharmacy and [place], the medication is being converted to the equivalent oral dose form.  These criteria include: -No active GI bleeding -Able to tolerate diet of full liquids (or better) or tube feeding -Able to tolerate other medications by the oral or enteral route  If you have any questions about this conversion, please contact the Pharmacy Department (phone [phone]).  Thank you.

## 2017-12-13 NOTE — Evaluation (Signed)
Physical Therapy Evaluation Patient Details Name: Fernando Jones MRN: [id number] DOB: 10-Jul-1961 Today's Date: 12/13/2017   History of Present Illness  Pt is a 57 y/o male admitted secondary to LUQ pain. Found to have L kidney stone and new onset ESRD. Pt is s/p cystoscopy and stent placement on 1/3, and R AV fistula creation and R IJ catheter placement on 1/4. Pt also started HD during admission. PMH includes DM, HTN, tobacco abuse, and CKD 5. \   Clinical Impression  Pt admitted secondary to problem above with deficits below. PTA, pt was independent with functional mobility. Upon eval, pt presenting with weakness, fatigue, decreased balance, and limited activity tolerance. REquired use of RW and only able to tolerate short ambulation distance within the room. Required min to min guard assist for mobility. Pt currently lives alone and only has friends to check on him intermittently. Feel pt would benefit from short term SNF at d/c to address mobility deficits prior to return home. If fatigue and weakness improves, pt may be able to progress to home with HHPT. Will continue to follow acutely to maximize functional mobility independence and safety.     Follow Up Recommendations SNF;Supervision/Assistance - 24 hour    Equipment Recommendations  Rolling walker with 5" wheels;3in1 (PT)    Recommendations for Other Services       Precautions / Restrictions Precautions Precautions: Fall Restrictions Weight Bearing Restrictions: No      Mobility  Bed Mobility Overal bed mobility: Needs Assistance Bed Mobility: Supine to Sit;Sit to Supine     Supine to sit: Min guard Sit to supine: Supervision   General bed mobility comments: Min guard for steadying assist. Used elevated HOB and bed rails and required extended time.   Transfers Overall transfer level: Needs assistance Equipment used: Rolling walker (2 wheeled) Transfers: Sit to/from Stand Sit to Stand: Min assist          General transfer comment: Min A for lift assist and steadying. Reports dizziness upon standing and required standing rest for symptoms to resolve.   Ambulation/Gait Ambulation/Gait assistance: Min guard Ambulation Distance (Feet): 15 Feet Assistive device: Rolling walker (2 wheeled) Gait Pattern/deviations: Step-through pattern;Decreased stride length;Trunk flexed Gait velocity: Decreased  Gait velocity interpretation: Below normal speed for age/gender General Gait Details: Pt requiring min guard for safety. Pt fatigues very easily and required use of RW for steadying. Pt reports increased weakness. Mobility tolerance limited secondary to dizziness. Required cues for upright posture throughout.   Stairs            Wheelchair Mobility    Modified Rankin (Stroke Patients Only)       Balance Overall balance assessment: Needs assistance Sitting-balance support: No upper extremity supported;Feet supported Sitting balance-Leahy Scale: Fair Sitting balance - Comments: Slouched posture in sitting. REquired cues for upright posture.    Standing balance support: Bilateral upper extremity supported;During functional activity Standing balance-Leahy Scale: Poor Standing balance comment: Reliant on BUE support.                              Pertinent Vitals/Pain Pain Assessment: No/denies pain    Home Living Family/patient expects to be discharged to:: Private residence Living Arrangements: Alone Available Help at Discharge: Friend(s);Available PRN/intermittently Type of Home: Apartment Home Access: Level entry     Home Layout: One level Home Equipment: None      Prior Function Level of Independence: Independent  Hand Dominance   Dominant Hand: Left    Extremity/Trunk Assessment   Upper Extremity Assessment Upper Extremity Assessment: Defer to OT evaluation    Lower Extremity Assessment Lower Extremity Assessment: Generalized  weakness    Cervical / Trunk Assessment Cervical / Trunk Assessment: Kyphotic  Communication   Communication: No difficulties  Cognition Arousal/Alertness: Awake/alert Behavior During Therapy: WFL for tasks assessed/performed Overall Cognitive Status: Within Functional Limits for tasks assessed                                        General Comments General comments (skin integrity, edema, etc.): No family present during session.     Exercises     Assessment/Plan    PT Assessment Patient needs continued PT services  PT Problem List Decreased strength;Decreased balance;Decreased activity tolerance;Decreased mobility;Decreased knowledge of use of DME;Decreased knowledge of precautions       PT Treatment Interventions DME instruction;Gait training;Functional mobility training;Therapeutic activities;Therapeutic exercise;Balance training;Neuromuscular re-education;Patient/family education    PT Goals (Current goals can be found in the Care Plan section)  Acute Rehab PT Goals Patient Stated Goal: none stated  PT Goal Formulation: With patient Time For Goal Achievement: 12/27/17 Potential to Achieve Goals: Good    Frequency Min 3X/week   Barriers to discharge Decreased caregiver support Lives alone     Co-evaluation               AM-PAC PT "6 Clicks" Daily Activity  Outcome Measure Difficulty turning over in bed (including adjusting bedclothes, sheets and blankets)?: A Little Difficulty moving from lying on back to sitting on the side of the bed? : Unable Difficulty sitting down on and standing up from a chair with arms (e.g., wheelchair, bedside commode, etc,.)?: Unable Help needed moving to and from a bed to chair (including a wheelchair)?: A Little Help needed walking in hospital room?: A Little Help needed climbing 3-5 steps with a railing? : A Lot 6 Click Score: 13    End of Session Equipment Utilized During Treatment: Gait belt Activity  Tolerance: Treatment limited secondary to medical complications (Comment)(dizziness ) Patient left: in bed;with call bell/phone within reach;with bed alarm set Nurse Communication: Mobility status;Other (comment)(dizziness ) PT Visit Diagnosis: Unsteadiness on feet (R26.81);Muscle weakness (generalized) (M62.81)    Time: 4827-0786 PT Time Calculation (min) (ACUTE ONLY): 15 min   Charges:   PT Evaluation $PT Eval Moderate Complexity: 1 Mod     PT G Codes:        Leighton Ruff, PT, DPT  Acute Rehabilitation Services  Pager: [phone]   Rudean Hitt 12/13/2017, 1:35 PM

## 2017-12-13 NOTE — Progress Notes (Signed)
PROGRESS NOTE  Fernando Jones OBS:[id number] DOB: [birth year] DOA: 12/06/2017 PCP: Patient, No Pcp Per   LOS: 7 days   Brief Narrative / Interim history: 57 y.o. Hispanic male, with history of diabetes mellitus, hypertension, CKD V, who came to hospital with abdominal pain with nausea and vomiting. CT showed mild left sided hydronephrosis and distal ureteral calculus, possible ascending colon wall thickening, bilateral pleural effusions. Labs showed BUN 36, creatinine 15.16.  Nephrology and urology were consulted.  He reports that patient has been noncompliant with his medications for his hypertension as well as diabetes.  Assessment & Plan: Active Problems:   DM (diabetes mellitus), type 2 with renal complications (HCC)   Essential hypertension   ESRD (end stage renal disease) (HCC)  End-stage renal disease, new HD patient -Patient's previous creatinine from 2017 was around 5, on admission 15.17.  He has been noncompliant with his home medications.  BUN is 136, and seems to have symptomatic uremic symptoms with transient episodes of confusion. -Nephrology consulted, patient had dialysis catheter placed, converted to tunneled catheter 1-04.Marland Kitchen He underwent HD 1-02. -awaiting clip Underwent AV fistula 1-04.  Continue with hemodialysis   Nausea, vomiting; improved.  Abdominal pain has improved. .  KUB; finding with constipation. Tap water enema ordered.  PPI  Weakness;  Vitamin D low. Started on hecterol with HD>  Hb stable.  PT evaluation   Hypertension -Continue home medications with Coreg and hydralazine.   -improved controlled.   Anemia; iron deficiency. Chronic diseases.  Needs out patient colonoscopy.  Needs iron tablet at discharge  On aranesp. Received IV iron.  Hb stable at 8  Left ureteral stone/hydronephrosis:  -Urology consulted. Discussed with Dr Lorrene Reid ok to proceed with cystoscopy 1-03 Un underwent cystoscopy , stone extraction and stent placement 1-03. He  report improvement of pain and nausea.  Develops urine retention. Had foley catheter placed.  Started on flomax.  Had stent and foley catheter removed 1-07.    Elevated troponin -0.05>> 0.06>> 0.07, overall flat, not in a pattern consistent with ACS -No chest pain  History of diabetes mellitus -Last hemoglobin A1c from 2017 was 5.3.  Repeat hemoglobin A1c now 4.6, this is to be under control, question whether he truly had diabetes in the past or not.   ?  Colitis -CT scan concerning for thickening of the ascending colon, however patient is asymptomatic -Denies abdominal pain, denies diarrhea.   DVT prophylaxis: heparin Code Status: Full code Family Communication: no family at bedside Disposition Plan: home when ready   Consultants:   Nephrology  Urology  Procedures:   Tunneled HD cath placement 1/2  Antimicrobials:  None  Subjective: He is still feeling weak.  No BM     Objective: Vitals:   12/13/17 0439 12/13/17 0441 12/13/17 0632 12/13/17 1310  BP: (!) 181/72 (!) 181/72 (!) 170/68 (!) 154/60  Pulse: 79 79  79  Resp: 17 17  19   Temp: 99 F (37.2 C) 99 F (37.2 C)  98.9 F (37.2 C)  TempSrc:  Oral  Oral  SpO2: 99% 99%  100%  Weight:  63.7 kg (140 lb 6.9 oz)      Intake/Output Summary (Last 24 hours) at 12/13/2017 1424 Last data filed at 12/13/2017 0933 Gross per 24 hour  Intake 360 ml  Output -  Net 360 ml   Filed Weights   12/12/17 0729 12/12/17 1129 12/13/17 0441  Weight: 64.9 kg (143 lb 1.3 oz) 63.3 kg (139 lb 8.8 oz) 63.7 kg (140  lb 6.9 oz)    Examination:  Constitutional: NAD Respiratory: CTA Cardiovascular ;S 1, S 2 RRR Abdomen: BS present, soft, nt Neurologic non focal.  Extremities; right arm with incision after AV fistula sx.     Data Reviewed: I have independently reviewed following labs and imaging studies   CBC: Recent Labs  Lab 12/07/17 0349 12/08/17 0419 12/09/17 0207 12/10/17 0305 12/11/17 1147  WBC 6.7 6.0 4.7 8.6  9.2  HGB 6.9* 8.1* 8.0* 8.0* 8.1*  HCT 19.9* 23.7* 24.5* 25.0* 26.1*  MCV 87.7 87.8 88.8 90.6 93.9  PLT 212 211 233 205 485   Basic Metabolic Panel: Recent Labs  Lab 12/07/17 0345 12/07/17 0920 12/08/17 0419 12/09/17 0207 12/10/17 1325 12/12/17 0730  NA 133* 134* 136 136 136 135  K 5.0 4.7 4.0 3.8 4.1 3.8  CL 107 109 105 103 101 97*  CO2 10* 11* 18* 23 26 27   GLUCOSE 68 83 78 109* 113* 99  BUN 133* 133* 80* 75* 45* 30*  CREATININE 15.80* 15.89* 11.15* 10.48* 8.47* 6.76*  CALCIUM 6.2* 6.2* 6.5* 6.5* 7.2* 7.9*  MG  --   --  1.8  --   --   --   PHOS 9.8*  --   --  5.0* 4.2 2.7   GFR: CrCl cannot be calculated (Unknown ideal weight.). Liver Function Tests: Recent Labs  Lab 12/07/17 0920 12/07/17 2100 12/08/17 0419 12/09/17 0207 12/10/17 1325 12/12/17 0730  AST 7*  --  10*  --   --   --   ALT 8* 8* 9*  --   --   --   ALKPHOS 51  --  55  --   --   --   BILITOT 0.4  --  0.5  --   --   --   PROT 5.0*  --  5.2*  --   --   --   ALBUMIN 2.5*  --  2.5* 2.5* 2.5* 2.3*   No results for input(s): LIPASE, AMYLASE in the last 168 hours. No results for input(s): AMMONIA in the last 168 hours. Coagulation Profile: Recent Labs  Lab 12/07/17 0349  INR 1.25   Cardiac Enzymes: Recent Labs  Lab 12/06/17 2102 12/07/17 0349 12/07/17 0920  TROPONINI 0.05* 0.06* 0.07*   BNP (last 3 results) No results for input(s): PROBNP in the last 8760 hours. HbA1C: No results for input(s): HGBA1C in the last 72 hours. CBG: Recent Labs  Lab 12/12/17 2010 12/13/17 0006 12/13/17 0438 12/13/17 0745 12/13/17 1206  GLUCAP 181* 117* 86 87 125*   Lipid Profile: No results for input(s): CHOL, HDL, LDLCALC, TRIG, CHOLHDL, LDLDIRECT in the last 72 hours. Thyroid Function Tests: Recent Labs    12/11/17 1147  TSH 1.731   Anemia Panel: No results for input(s): VITAMINB12, FOLATE, FERRITIN, TIBC, IRON, RETICCTPCT in the last 72 hours. Urine analysis:    Component Value Date/Time    COLORURINE YELLOW 12/06/2017 1339   APPEARANCEUR CLEAR 12/06/2017 1339   LABSPEC 1.011 12/06/2017 1339   PHURINE 5.0 12/06/2017 1339   GLUCOSEU 50 (A) 12/06/2017 1339   HGBUR MODERATE (A) 12/06/2017 1339   BILIRUBINUR NEGATIVE 12/06/2017 1339   KETONESUR NEGATIVE 12/06/2017 1339   PROTEINUR >=300 (A) 12/06/2017 1339   NITRITE NEGATIVE 12/06/2017 1339   LEUKOCYTESUR NEGATIVE 12/06/2017 1339   Sepsis Labs: Invalid input(s): PROCALCITONIN, LACTICIDVEN  No results found for this or any previous visit (from the past 240 hour(s)).    Radiology Studies: No results found.  Scheduled Meds: . aspirin EC  81 mg Oral Daily  . calcium carbonate  4 tablet Oral TID WC  . calcium carbonate  4 tablet Oral TID  . carvedilol  12.5 mg Oral BID WC  . darbepoetin (ARANESP) injection - DIALYSIS  200 mcg Intravenous Q Thu-HD  . doxercalciferol  2 mcg Intravenous Q M,W,F-HD  . heparin  5,000 Units Subcutaneous Q8H  . hydrALAZINE  25 mg Oral Q8H  . pantoprazole (PROTONIX) IV  40 mg Intravenous Q12H  . senna-docusate  1 tablet Oral BID  . sodium chloride flush  3 mL Intravenous Q12H  . tamsulosin  0.4 mg Oral Daily   Continuous Infusions: . sodium chloride    . sodium chloride 10 mL/hr at 12/09/17 [place], MD [facility] Pager [phone]  If 7PM-7AM, please contact night-coverage [email or web address] Password [facility] 12/13/2017, 2:24 PM

## 2017-12-13 NOTE — Progress Notes (Signed)
[facility] ROUNDING NOTE   Subjective:   16M new ESRD, anemia, obstructing L kidney stone  Patient completed Tx #4  CLIP in progress  No issues today    Objective:  Vital signs in last 24 hours:  Temp:  [98.4 F (36.9 C)-99.1 F (37.3 C)] 99 F (37.2 C) (01/08 0441) Pulse Rate:  [75-90] 79 (01/08 0441) Resp:  [15-17] 17 (01/08 0441) BP: (107-181)/(40-72) 170/68 (01/08 0632) SpO2:  [98 %-99 %] 99 % (01/08 0441) Weight:  [139 lb 8.8 oz (63.3 kg)-140 lb 6.9 oz (63.7 kg)] 140 lb 6.9 oz (63.7 kg) (01/08 0441)  Weight change:  Filed Weights   12/12/17 0729 12/12/17 1129 12/13/17 0441  Weight: 143 lb 1.3 oz (64.9 kg) 139 lb 8.8 oz (63.3 kg) 140 lb 6.9 oz (63.7 kg)    Intake/Output: I/O last 3 completed shifts: In: 161 [P.O.:600; I.V.:142] Out: 2250 [Urine:150; Other:2100]   Intake/Output this shift:  Total I/O In: 120 [P.O.:120] Out: -   RRR no rub, nl s1s2 Coarse bs b/l\ Trace LEE +B of R FA AVF TDC present in R IJ, bandaged     Basic Metabolic Panel: Recent Labs  Lab 12/07/17 0345 12/07/17 0920 12/08/17 0419 12/09/17 0207 12/10/17 1325 12/12/17 0730  NA 133* 134* 136 136 136 135  K 5.0 4.7 4.0 3.8 4.1 3.8  CL 107 109 105 103 101 97*  CO2 10* 11* 18* 23 26 27   GLUCOSE 68 83 78 109* 113* 99  BUN 133* 133* 80* 75* 45* 30*  CREATININE 15.80* 15.89* 11.15* 10.48* 8.47* 6.76*  CALCIUM 6.2* 6.2* 6.5* 6.5* 7.2* 7.9*  MG  --   --  1.8  --   --   --   PHOS 9.8*  --   --  5.0* 4.2 2.7    Liver Function Tests: Recent Labs  Lab 12/06/17 1348  12/07/17 0920 12/07/17 2100 12/08/17 0419 12/09/17 0207 12/10/17 1325 12/12/17 0730  AST 9*  --  7*  --  10*  --   --   --   ALT 10*  --  8* 8* 9*  --   --   --   ALKPHOS 65  --  51  --  55  --   --   --   BILITOT 0.5  --  0.4  --  0.5  --   --   --   PROT 6.8  --  5.0*  --  5.2*  --   --   --   ALBUMIN 3.4*   < > 2.5*  --  2.5* 2.5* 2.5* 2.3*   < > = values in this interval not displayed.    Recent Labs  Lab 12/06/17 1348  LIPASE 40   No results for input(s): AMMONIA in the last 168 hours.  CBC: Recent Labs  Lab 12/07/17 0349 12/08/17 0419 12/09/17 0207 12/10/17 0305 12/11/17 1147  WBC 6.7 6.0 4.7 8.6 9.2  HGB 6.9* 8.1* 8.0* 8.0* 8.1*  HCT 19.9* 23.7* 24.5* 25.0* 26.1*  MCV 87.7 87.8 88.8 90.6 93.9  PLT 212 211 233 205 226    Cardiac Enzymes: Recent Labs  Lab 12/06/17 1348 12/06/17 2102 12/07/17 0349 12/07/17 0920  TROPONINI 0.07* 0.05* 0.06* 0.07*    BNP: Invalid input(s): POCBNP  CBG: Recent Labs  Lab 12/12/17 1607 12/12/17 2010 12/13/17 0006 12/13/17 0438 12/13/17 0745  GLUCAP 129* 181* 117* 86 87    Microbiology: No results found for this or any previous visit.  Coagulation Studies: No results for input(s): LABPROT, INR in the last 72 hours.  Urinalysis: No results for input(s): COLORURINE, LABSPEC, PHURINE, GLUCOSEU, HGBUR, BILIRUBINUR, KETONESUR, PROTEINUR, UROBILINOGEN, NITRITE, LEUKOCYTESUR in the last 72 hours.  Invalid input(s): APPERANCEUR    Imaging: Dg Abd Portable 1v  Result Date: 12/11/2017 CLINICAL DATA:  Vomiting for couple days. EXAM: PORTABLE ABDOMEN - 1 VIEW COMPARISON:  CT abdomen/ pelvis 12/06/2017 FINDINGS: There is a large amount of stool in the ascending colon. There is a moderate amount of stool in the transverse and descending colon. There is no bowel dilatation to suggest obstruction. There is no evidence of pneumoperitoneum, portal venous gas or pneumatosis. There is a left nephroureteral stent present. The osseous structures are unremarkable. IMPRESSION: 1. Moderate -large amount of stool throughout the colon most severe in the ascending colon. 2. Left nephroureteral stent. Electronically Signed   By: Kathreen Devoid   On: 12/11/2017 12:37     Medications:   . sodium chloride    . sodium chloride 10 mL/hr at 12/09/17 1014   . aspirin EC  81 mg Oral Daily  . calcium carbonate  4 tablet Oral TID WC  .  calcium carbonate  4 tablet Oral TID  . carvedilol  12.5 mg Oral BID WC  . darbepoetin (ARANESP) injection - DIALYSIS  200 mcg Intravenous Q Thu-HD  . doxercalciferol  2 mcg Intravenous Q M,W,F-HD  . heparin  5,000 Units Subcutaneous Q8H  . hydrALAZINE  25 mg Oral Q8H  . pantoprazole (PROTONIX) IV  40 mg Intravenous Q12H  . senna-docusate  1 tablet Oral BID  . sodium chloride flush  3 mL Intravenous Q12H  . tamsulosin  0.4 mg Oral Daily   sodium chloride, acetaminophen **OR** acetaminophen, heparin, heparin, hydrALAZINE, HYDROmorphone (DILAUDID) injection, lidocaine (PF), ondansetron **OR** ondansetron (ZOFRAN) IV, sodium chloride flush, sorbitol, traMADol  Assessment/ Plan:  1. New ESRD 1. S/p TDC and AVF 1/4  2. CLIP pendin 3. S/p 4 incremental HD Tx, now full Tx: Qb400,4h,UF to 63kg not to exceed 2.5L, use TDC, no heparin,2K   2. obstructuer L ureteral stone s/p removeal 12/08/17 with urology; ureteral stent to be removed 1/7 3. BMD: Low Ca, inc P; PTH 404 1/2  On Tums + C3  Start hectoral 41mcg q HD 4. Anemia: ESA started + IV Fe, follow 5. HTN: on carvedilol and hydralazine, needs probing for lower post HD weights        LOS: 7 Eulanda Dorion W @TODAY @10 :17 AM

## 2017-12-13 NOTE — Progress Notes (Signed)
CSW received consult regarding SNF placement. Due to patient's legal status/lack of insurance, we are unable to place him in SNF.   Fernando Jones LCSWA [phone]

## 2017-12-14 LAB — CBC
HEMATOCRIT: 26 % — AB (ref 39.0–52.0)
HEMOGLOBIN: 7.9 g/dL — AB (ref 13.0–17.0)
MCH: 29.2 pg (ref 26.0–34.0)
MCHC: 30.4 g/dL (ref 30.0–36.0)
MCV: 95.9 fL (ref 78.0–100.0)
Platelets: 262 10*3/uL (ref 150–400)
RBC: 2.71 MIL/uL — AB (ref 4.22–5.81)
RDW: 14.1 % (ref 11.5–15.5)
WBC: 12.3 10*3/uL — AB (ref 4.0–10.5)

## 2017-12-14 LAB — RENAL FUNCTION PANEL
ALBUMIN: 2.3 g/dL — AB (ref 3.5–5.0)
ANION GAP: 8 (ref 5–15)
BUN: 33 mg/dL — ABNORMAL HIGH (ref 6–20)
CO2: 27 mmol/L (ref 22–32)
Calcium: 7.7 mg/dL — ABNORMAL LOW (ref 8.9–10.3)
Chloride: 98 mmol/L — ABNORMAL LOW (ref 101–111)
Creatinine, Ser: 6.27 mg/dL — ABNORMAL HIGH (ref 0.61–1.24)
GFR calc Af Amer: 10 mL/min — ABNORMAL LOW (ref >60)
GFR calc non Af Amer: 9 mL/min — ABNORMAL LOW (ref >60)
GLUCOSE: 91 mg/dL (ref 65–99)
PHOSPHORUS: 2.1 mg/dL — AB (ref 2.5–4.6)
POTASSIUM: 3.5 mmol/L (ref 3.5–5.1)
Sodium: 133 mmol/L — ABNORMAL LOW (ref 135–145)

## 2017-12-14 LAB — GLUCOSE, CAPILLARY
GLUCOSE-CAPILLARY: 79 mg/dL (ref 65–99)
Glucose-Capillary: 109 mg/dL — ABNORMAL HIGH (ref 65–99)
Glucose-Capillary: 121 mg/dL — ABNORMAL HIGH (ref 65–99)
Glucose-Capillary: 164 mg/dL — ABNORMAL HIGH (ref 65–99)
Glucose-Capillary: 181 mg/dL — ABNORMAL HIGH (ref 65–99)

## 2017-12-14 LAB — PREPARE RBC (CROSSMATCH)

## 2017-12-14 MED ORDER — HEPARIN SODIUM (PORCINE) 1000 UNIT/ML DIALYSIS: 1000.0000 [IU] | INTRAMUSCULAR | Status: DC | PRN

## 2017-12-14 MED ORDER — SODIUM CHLORIDE 0.9 % IV SOLN: 100.0000 mL | INTRAVENOUS | Status: DC | PRN

## 2017-12-14 MED ORDER — SODIUM CHLORIDE 0.9 % IV SOLN
Freq: Once | INTRAVENOUS | Status: AC
  Administered 2017-12-14: 16:00:00 via INTRAVENOUS

## 2017-12-14 MED ORDER — ALTEPLASE 2 MG IJ SOLR: 2.0000 mg | Freq: Once | INTRAMUSCULAR | Status: DC | PRN

## 2017-12-14 MED ORDER — LIDOCAINE-PRILOCAINE 2.5-2.5 % EX CREA: 1.0000 "application " | TOPICAL_CREAM | CUTANEOUS | Status: DC | PRN

## 2017-12-14 MED ORDER — LIDOCAINE HCL (PF) 1 % IJ SOLN: 5.0000 mL | INTRAMUSCULAR | Status: DC | PRN

## 2017-12-14 MED ORDER — PENTAFLUOROPROP-TETRAFLUOROETH EX AERO: 1.0000 "application " | INHALATION_SPRAY | CUTANEOUS | Status: DC | PRN

## 2017-12-14 NOTE — Progress Notes (Signed)
[facility] ROUNDING NOTE   Subjective:   49M new ESRD, anemia, obstructing L kidney stone  Patient completed Tx #5CLIP in progress  No issues today   Seen on dialysis    Objective:  Vital signs in last 24 hours:  Temp:  [98.3 F (36.8 C)-98.9 F (37.2 C)] 98.3 F (36.8 C) (01/09 0656) Pulse Rate:  [65-79] 72 (01/09 1000) Resp:  [8-19] 8 (01/09 1000) BP: (96-186)/(46-88) 124/50 (01/09 1000) SpO2:  [98 %-100 %] 98 % (01/09 0656) Weight:  [143 lb 15.4 oz (65.3 kg)] 143 lb 15.4 oz (65.3 kg) (01/09 0656)  Weight change: 14.1 oz (0.4 kg) Filed Weights   12/12/17 1129 12/13/17 0441 12/14/17 0656  Weight: 139 lb 8.8 oz (63.3 kg) 140 lb 6.9 oz (63.7 kg) 143 lb 15.4 oz (65.3 kg)    Intake/Output: I/O last 3 completed shifts: In: 778 [P.O.:778] Out: -    Intake/Output this shift:  No intake/output data recorded.  RRR no rub, nl s1s2 Coarse bs b/l\ Trace LEE +B of R FA AVF TDC present in R IJ, bandaged     Basic Metabolic Panel: Recent Labs  Lab 12/08/17 0419 12/09/17 0207 12/10/17 1325 12/12/17 0730 12/14/17 0744  NA 136 136 136 135 133*  K 4.0 3.8 4.1 3.8 3.5  CL 105 103 101 97* 98*  CO2 18* 23 26 27 27   GLUCOSE 78 109* 113* 99 91  BUN 80* 75* 45* 30* 33*  CREATININE 11.15* 10.48* 8.47* 6.76* 6.27*  CALCIUM 6.5* 6.5* 7.2* 7.9* 7.7*  MG 1.8  --   --   --   --   PHOS  --  5.0* 4.2 2.7 2.1*    Liver Function Tests: Recent Labs  Lab 12/07/17 2100 12/08/17 0419 12/09/17 0207 12/10/17 1325 12/12/17 0730 12/14/17 0744  AST  --  10*  --   --   --   --   ALT 8* 9*  --   --   --   --   ALKPHOS  --  55  --   --   --   --   BILITOT  --  0.5  --   --   --   --   PROT  --  5.2*  --   --   --   --   ALBUMIN  --  2.5* 2.5* 2.5* 2.3* 2.3*   No results for input(s): LIPASE, AMYLASE in the last 168 hours. No results for input(s): AMMONIA in the last 168 hours.  CBC: Recent Labs  Lab 12/08/17 0419 12/09/17 0207 12/10/17 0305  12/11/17 1147 12/14/17 0745  WBC 6.0 4.7 8.6 9.2 12.3*  HGB 8.1* 8.0* 8.0* 8.1* 7.9*  HCT 23.7* 24.5* 25.0* 26.1* 26.0*  MCV 87.8 88.8 90.6 93.9 95.9  PLT 211 233 205 226 262    Cardiac Enzymes: No results for input(s): CKTOTAL, CKMB, CKMBINDEX, TROPONINI in the last 168 hours.  BNP: Invalid input(s): POCBNP  CBG: Recent Labs  Lab 12/13/17 0745 12/13/17 1206 12/13/17 1618 12/13/17 2008 12/14/17 0441  GLUCAP 87 125* 132* 165* 24    Microbiology: No results found for this or any previous visit.  Coagulation Studies: No results for input(s): LABPROT, INR in the last 72 hours.  Urinalysis: No results for input(s): COLORURINE, LABSPEC, PHURINE, GLUCOSEU, HGBUR, BILIRUBINUR, KETONESUR, PROTEINUR, UROBILINOGEN, NITRITE, LEUKOCYTESUR in the last 72 hours.  Invalid input(s): APPERANCEUR    Imaging: No results found.   Medications:   . sodium chloride    .  sodium chloride 10 mL/hr at 12/09/17 1014  . sodium chloride    . sodium chloride    . sodium chloride     . aspirin EC  81 mg Oral Daily  . calcium carbonate  4 tablet Oral TID WC  . calcium carbonate  4 tablet Oral TID  . carvedilol  12.5 mg Oral BID WC  . darbepoetin (ARANESP) injection - DIALYSIS  200 mcg Intravenous Q Thu-HD  . doxercalciferol  2 mcg Intravenous Q M,W,F-HD  . heparin  5,000 Units Subcutaneous Q8H  . hydrALAZINE  25 mg Oral Q8H  . pantoprazole  40 mg Oral BID AC  . senna-docusate  1 tablet Oral BID  . sodium chloride flush  3 mL Intravenous Q12H  . tamsulosin  0.4 mg Oral Daily   sodium chloride, sodium chloride, sodium chloride, acetaminophen **OR** acetaminophen, alteplase, heparin, heparin, heparin, hydrALAZINE, HYDROmorphone (DILAUDID) injection, lidocaine (PF), lidocaine (PF), lidocaine-prilocaine, ondansetron **OR** ondansetron (ZOFRAN) IV, pentafluoroprop-tetrafluoroeth, sodium chloride flush, sorbitol, traMADol  Assessment/ Plan:  1. New ESRD 1. S/p TDC and AVF 1/4  2. CLIP  pending 3. S/p5incremental HD Tx, now full Tx: Qb400,4h,UF to 63kg not to exceed 2.5L, use TDC, no heparin,2K  2. obstructuer L ureteral stone s/p removeal 12/08/17 with urology; ureteral stent to be removed 1/7 3. BMD: Low Ca, inc P; PTH404 1/2On Tums + C3Start hectoral 73mcg q HD 4. Anemia: ESA started + IV Fe, follow 5. HTN: on carvedilol and hydralazine, needs probing for lower post HD weights      LOS: 8 Cherish Runde W @TODAY @10 :32 AM

## 2017-12-14 NOTE — Anesthesia Postprocedure Evaluation (Signed)
Anesthesia Post Note  Patient: Fernando Jones  Procedure(s) Performed: CYSTOSCOPY/RETROGRADE PYLEOGRAM LEFT URETEROSCOPY AND STONE EXTRACTION (Left ) HOLMIUM LASER APPLICATION (Left )     Patient location during evaluation: PACU Anesthesia Type: General Level of consciousness: awake and alert Pain management: pain level controlled Vital Signs Assessment: post-procedure vital signs reviewed and stable Respiratory status: spontaneous breathing, nonlabored ventilation, respiratory function stable and patient connected to nasal cannula oxygen Cardiovascular status: blood pressure returned to baseline and stable Postop Assessment: no apparent nausea or vomiting Anesthetic complications: no    Last Vitals:  Vitals:   12/14/17 0700 12/14/17 0730  BP: (!) 175/79 (!) 156/69  Pulse: 70 70  Resp: 12 10  Temp:    SpO2:      Last Pain:  Vitals:   12/14/17 0656  TempSrc: Oral  PainSc: 0-No pain                 [name]

## 2017-12-14 NOTE — Progress Notes (Signed)
PROGRESS NOTE  Fernando Jones YDX:[id number] DOB: 1961-04-08 DOA: 12/06/2017 PCP: Patient, No Pcp Per   LOS: 8 days   Brief Narrative / Interim history: 57 y.o. Hispanic male, with history of diabetes mellitus, hypertension, CKD V, who came to hospital with abdominal pain with nausea and vomiting. CT showed mild left sided hydronephrosis and distal ureteral calculus, possible ascending colon wall thickening, bilateral pleural effusions. Labs showed BUN 36, creatinine 15.16.  Nephrology and urology were consulted.  He reports that patient has been noncompliant with his medications for his hypertension as well as diabetes.  Patient admitted with acute on chronic renal failure, new ESRD , new HD. He is awaiting clip. He was also found to have Left ureteral stone/hydronephrosis. He underwent stone extraction and stent placement 1-03.  He is awaiting clipping for HD.   Assessment & Plan: Active Problems:   DM (diabetes mellitus), type 2 with renal complications (HCC)   Essential hypertension   ESRD (end stage renal disease) (HCC)  End-stage renal disease, new HD patient -Patient's previous creatinine from 2017 was around 5, on admission 15.17.  He has been noncompliant with his home medications.  BUN is 136, and seems to have symptomatic uremic symptoms with transient episodes of confusion. -Nephrology consulted, patient had dialysis catheter placed, converted to tunneled catheter 1-04.Marland Kitchen He underwent HD 1-02. -Awaiting clip Underwent AV fistula 1-04.  Continue with hemodialysis   Nausea, vomiting; improved.  Abdominal pain has improved. .  KUB; finding with constipation. Tap water enema ordered.  PPI  Weakness;  Vitamin D low. Started on hecterol with HD>  PT evaluation  Transfuse one unit PRBC>   Hypertension -Continue home medications with Coreg and hydralazine.   -improved controlled.   Anemia; iron deficiency. Chronic diseases.  Needs out patient colonoscopy.  Needs iron  tablet at discharge  On aranesp. Received IV iron.  Transfuse one unit PRBC with HD  Left ureteral stone/hydronephrosis:  -Urology consulted.  Un underwent cystoscopy , stone extraction and stent placement 1-03. He report improvement of pain and nausea.  Develops urine retention. Had foley catheter placed.  Started on flomax.  Had stent and foley catheter removed 1-07.    Elevated troponin -0.05>> 0.06>> 0.07, overall flat, not in a pattern consistent with ACS -No chest pain  History of diabetes mellitus -Last hemoglobin A1c from 2017 was 5.3.  Repeat hemoglobin A1c now 4.6.   ?  Colitis -CT scan concerning for thickening of the ascending colon, however patient is asymptomatic -Denies abdominal pain, denies diarrhea.   DVT prophylaxis: heparin Code Status: Full code Family Communication: no family at bedside Disposition Plan: home when ready   Consultants:   Nephrology  Urology  Procedures:   Tunneled HD cath placement 1/2  Antimicrobials:  None  Subjective: He is feeling weak.      Objective: Vitals:   12/14/17 1210 12/14/17 1342 12/14/17 1415 12/14/17 1445  BP: (!) 143/55 (!) 96/41 (!) 104/40 (!) 107/40  Pulse: 70 69 64 65  Resp:  10 14 14   Temp:  98.4 F (36.9 C) 98.1 F (36.7 C) 98.3 F (36.8 C)  TempSrc:  Oral Oral Oral  SpO2:  97%  99%  Weight:        Intake/Output Summary (Last 24 hours) at 12/14/2017 1509 Last data filed at 12/14/2017 1343 Gross per 24 hour  Intake 678 ml  Output 2000 ml  Net -1322 ml   Filed Weights   12/13/17 0441 12/14/17 0656 12/14/17 1115  Weight: 63.7  kg (140 lb 6.9 oz) 65.3 kg (143 lb 15.4 oz) 63.4 kg (139 lb 12.4 oz)    Examination:  Constitutional: NAD Respiratory: CTA Cardiovascular ; S 1, S 2 RRR Abdomen: BS present, soft, nt Neurologic ; non focal.  Extremities; right arm with incision after AV fistula sx.     Data Reviewed: I have independently reviewed following labs and imaging studies    CBC: Recent Labs  Lab 2017-12-19 0419 12/09/17 0207 12/10/17 0305 12/11/17 1147 12/14/17 0745  WBC 6.0 4.7 8.6 9.2 12.3*  HGB 8.1* 8.0* 8.0* 8.1* 7.9*  HCT 23.7* 24.5* 25.0* 26.1* 26.0*  MCV 87.8 88.8 90.6 93.9 95.9  PLT 211 233 205 226 694   Basic Metabolic Panel: Recent Labs  Lab Dec 19, 2017 0419 12/09/17 0207 12/10/17 1325 12/12/17 0730 12/14/17 0744  NA 136 136 136 135 133*  K 4.0 3.8 4.1 3.8 3.5  CL 105 103 101 97* 98*  CO2 18* 23 26 27 27   GLUCOSE 78 109* 113* 99 91  BUN 80* 75* 45* 30* 33*  CREATININE 11.15* 10.48* 8.47* 6.76* 6.27*  CALCIUM 6.5* 6.5* 7.2* 7.9* 7.7*  MG 1.8  --   --   --   --   PHOS  --  5.0* 4.2 2.7 2.1*   GFR: CrCl cannot be calculated (Unknown ideal weight.). Liver Function Tests: Recent Labs  Lab 12/07/17 2100 12-19-17 0419 12/09/17 0207 12/10/17 1325 12/12/17 0730 12/14/17 0744  AST  --  10*  --   --   --   --   ALT 8* 9*  --   --   --   --   ALKPHOS  --  55  --   --   --   --   BILITOT  --  0.5  --   --   --   --   PROT  --  5.2*  --   --   --   --   ALBUMIN  --  2.5* 2.5* 2.5* 2.3* 2.3*   No results for input(s): LIPASE, AMYLASE in the last 168 hours. No results for input(s): AMMONIA in the last 168 hours. Coagulation Profile: No results for input(s): INR, PROTIME in the last 168 hours. Cardiac Enzymes: No results for input(s): CKTOTAL, CKMB, CKMBINDEX, TROPONINI in the last 168 hours. BNP (last 3 results) No results for input(s): PROBNP in the last 8760 hours. HbA1C: No results for input(s): HGBA1C in the last 72 hours. CBG: Recent Labs  Lab 12/13/17 1206 12/13/17 1618 12/13/17 2008 12/14/17 0441 12/14/17 1151  GLUCAP 125* 132* 165* 79 121*   Lipid Profile: No results for input(s): CHOL, HDL, LDLCALC, TRIG, CHOLHDL, LDLDIRECT in the last 72 hours. Thyroid Function Tests: No results for input(s): TSH, T4TOTAL, FREET4, T3FREE, THYROIDAB in the last 72 hours. Anemia Panel: No results for input(s): VITAMINB12,  FOLATE, FERRITIN, TIBC, IRON, RETICCTPCT in the last 72 hours. Urine analysis:    Component Value Date/Time   COLORURINE YELLOW 12/06/2017 1339   APPEARANCEUR CLEAR 12/06/2017 1339   LABSPEC 1.011 12/06/2017 1339   PHURINE 5.0 12/06/2017 1339   GLUCOSEU 50 (A) 12/06/2017 1339   HGBUR MODERATE (A) 12/06/2017 1339   BILIRUBINUR NEGATIVE 12/06/2017 1339   KETONESUR NEGATIVE 12/06/2017 1339   PROTEINUR >=300 (A) 12/06/2017 1339   NITRITE NEGATIVE 12/06/2017 1339   LEUKOCYTESUR NEGATIVE 12/06/2017 1339   Sepsis Labs: Invalid input(s): PROCALCITONIN, LACTICIDVEN  No results found for this or any previous visit (from the past 240 hour(s)).  Radiology Studies: No results found.   Scheduled Meds: . aspirin EC  81 mg Oral Daily  . carvedilol  12.5 mg Oral BID WC  . darbepoetin (ARANESP) injection - DIALYSIS  200 mcg Intravenous Q Thu-HD  . doxercalciferol  2 mcg Intravenous Q M,W,F-HD  . heparin  5,000 Units Subcutaneous Q8H  . hydrALAZINE  25 mg Oral Q8H  . pantoprazole  40 mg Oral BID AC  . senna-docusate  1 tablet Oral BID  . sodium chloride flush  3 mL Intravenous Q12H  . tamsulosin  0.4 mg Oral Daily   Continuous Infusions: . sodium chloride    . sodium chloride 10 mL/hr at 12/09/17 1014  . sodium chloride    . sodium chloride    . sodium chloride      Niel Hummer, MD [facility] Pager [phone]  If 7PM-7AM, please contact night-coverage [email or web address] Password [email or web address] 12/14/2017, 3:09 PM

## 2017-12-14 NOTE — Progress Notes (Signed)
Physical Therapy Treatment Patient Details Name: Fernando Jones MRN: [id number] DOB: Mar 17, 1961 Today's Date: 12/14/2017    History of Present Illness Pt is a 57 y/o male admitted secondary to LUQ pain. Found to have L kidney stone and new onset ESRD. Pt is s/p cystoscopy and stent placement on 1/3, and R AV fistula creation and R IJ catheter placement on 1/4. PMH includes DM, HTN, tobacco abuse, and CKD 5.     PT Comments    Pt reports "feeling terrible" He is limited secondary to weakness and dizziness. Pt in dialysis this AM and hgb at 7.9. He was able to ambulate 25 ft in room to and from restroom, but fatigued quickly. Plan to progress ambulation distance in next session. Pt is eager to recover. Will continue to follow acutely to maximize safety with mobility and activity tolerance.   Follow Up Recommendations  SNF;Supervision/Assistance - 24 hour     Equipment Recommendations  Rolling walker with 5" wheels;3in1 (PT)    Recommendations for Other Services       Precautions / Restrictions Precautions Precautions: Fall Restrictions Weight Bearing Restrictions: No    Mobility  Bed Mobility Overal bed mobility: Needs Assistance Bed Mobility: Supine to Sit;Sit to Supine     Supine to sit: Supervision Sit to supine: Supervision   General bed mobility comments: Supervision for safety. Use of bedrails and HOB elevated.  Transfers Overall transfer level: Needs assistance Equipment used: Rolling walker (2 wheeled) Transfers: Sit to/from Stand Sit to Stand: Min guard         General transfer comment: min guard for sit<>stand from bed and low toilet. Increased time and effort required.  Ambulation/Gait Ambulation/Gait assistance: Min guard Ambulation Distance (Feet): 25 Feet Assistive device: Rolling walker (2 wheeled) Gait Pattern/deviations: Step-through pattern;Decreased stride length;Trunk flexed Gait velocity: Decreased  Gait velocity interpretation: Below  normal speed for age/gender General Gait Details: min guard for safety. Pt reporting dizziness and "feeling terrible" with ambulation.   Stairs            Wheelchair Mobility    Modified Rankin (Stroke Patients Only)       Balance Overall balance assessment: Needs assistance Sitting-balance support: No upper extremity supported;Feet supported Sitting balance-Leahy Scale: Fair Sitting balance - Comments: Slouched posture in sitting. REquired cues for upright posture.    Standing balance support: Bilateral upper extremity supported;During functional activity Standing balance-Leahy Scale: Poor Standing balance comment: Reliant on BUE support.                             Cognition Arousal/Alertness: Awake/alert Behavior During Therapy: WFL for tasks assessed/performed Overall Cognitive Status: Within Functional Limits for tasks assessed                                        Exercises      General Comments General comments (skin integrity, edema, etc.): Pt in dialysis this AM and Hgb is 7.9      Pertinent Vitals/Pain Pain Assessment: No/denies pain    Home Living                      Prior Function            PT Goals (current goals can now be found in the care plan section) Acute Rehab PT Goals Patient Stated Goal:  none stated  PT Goal Formulation: With patient Time For Goal Achievement: 12/27/17 Potential to Achieve Goals: Good Progress towards PT goals: Progressing toward goals    Frequency    Min 3X/week      PT Plan Current plan remains appropriate    Co-evaluation              AM-PAC PT "6 Clicks" Daily Activity  Outcome Measure  Difficulty turning over in bed (including adjusting bedclothes, sheets and blankets)?: A Little Difficulty moving from lying on back to sitting on the side of the bed? : A Little Difficulty sitting down on and standing up from a chair with arms (e.g., wheelchair, bedside  commode, etc,.)?: Unable Help needed moving to and from a bed to chair (including a wheelchair)?: A Little Help needed walking in hospital room?: A Little Help needed climbing 3-5 steps with a railing? : A Lot 6 Click Score: 15    End of Session Equipment Utilized During Treatment: Gait belt Activity Tolerance: Treatment limited secondary to medical complications (Comment)(dizziness, weakness (dialysis this AM and 7.9 Hgb)) Patient left: in bed;with call bell/phone within reach;with bed alarm set Nurse Communication: Mobility status PT Visit Diagnosis: Unsteadiness on feet (R26.81);Muscle weakness (generalized) (M62.81)     Time: 2951-8841 PT Time Calculation (min) (ACUTE ONLY): 13 min  Charges:  $Gait Training: 8-22 mins                    G Codes:       Benjiman Core, [place] Pager [phone] Acute Rehab   Allena Katz 12/14/2017, 12:50 PM

## 2017-12-15 ENCOUNTER — Telehealth: Payer: Self-pay | Admitting: Vascular Surgery

## 2017-12-15 DIAGNOSIS — E440 Moderate protein-calorie malnutrition: Secondary | ICD-10-CM

## 2017-12-15 LAB — TYPE AND SCREEN
ABO/RH(D): A POS
ANTIBODY SCREEN: NEGATIVE
Unit division: 0

## 2017-12-15 LAB — BPAM RBC
Blood Product Expiration Date: 201902012359
ISSUE DATE / TIME: 201901091418
UNIT TYPE AND RH: 6200

## 2017-12-15 LAB — GLUCOSE, CAPILLARY
GLUCOSE-CAPILLARY: 103 mg/dL — AB (ref 65–99)
Glucose-Capillary: 87 mg/dL (ref 65–99)
Glucose-Capillary: 84 mg/dL (ref 65–99)
Glucose-Capillary: 100 mg/dL — ABNORMAL HIGH (ref 65–99)

## 2017-12-15 LAB — CBC
HEMATOCRIT: 29.4 % — AB (ref 39.0–52.0)
Hemoglobin: 9.3 g/dL — ABNORMAL LOW (ref 13.0–17.0)
MCH: 30.1 pg (ref 26.0–34.0)
MCHC: 31.6 g/dL (ref 30.0–36.0)
MCV: 95.1 fL (ref 78.0–100.0)
Platelets: 222 10*3/uL (ref 150–400)
RBC: 3.09 MIL/uL — ABNORMAL LOW (ref 4.22–5.81)
RDW: 15.9 % — AB (ref 11.5–15.5)
WBC: 10 10*3/uL (ref 4.0–10.5)

## 2017-12-15 MED ORDER — DARBEPOETIN ALFA 200 MCG/0.4ML IJ SOSY
200.0000 ug | PREFILLED_SYRINGE | INTRAMUSCULAR | Status: DC
  Administered 2017-12-16: 200 ug via INTRAVENOUS

## 2017-12-15 MED ORDER — NEPRO/CARBSTEADY PO LIQD
237.0000 mL | Freq: Two times a day (BID) | ORAL | Status: DC
  Administered 2017-12-15 – 2017-12-16 (×2): 237 mL via ORAL

## 2017-12-15 NOTE — Telephone Encounter (Signed)
Sched appt 01/24/18 at 10:30. Ph# not going through, pt has an active mychart, mailed appt letter.

## 2017-12-15 NOTE — Progress Notes (Signed)
Physical Therapy Treatment Patient Details Name: Fernando Jones MRN: [id number] DOB: Dec 09, 1960 Today's Date: 12/15/2017    History of Present Illness Pt is a 57 y/o male admitted secondary to LUQ pain. Found to have L kidney stone and new onset ESRD. Pt is s/p cystoscopy and stent placement on 1/3, and R AV fistula creation and R IJ catheter placement on 1/4. PMH includes DM, HTN, tobacco abuse, and CKD 5.     PT Comments    Pt progressing although slowly d/t fatigue; continue to recommend SNF--pt reports he has no family here [place] the states, no one to assist with transportation to dialysis; if D/C's home may need w/c in addition to RW and 3in1, will continue to follow  Follow Up Recommendations  SNF;Supervision/Assistance - 24 hour     Equipment Recommendations  Rolling walker with 5" wheels;3in1 (PT)    Recommendations for Other Services       Precautions / Restrictions Precautions Precautions: Fall Restrictions Weight Bearing Restrictions: No    Mobility  Bed Mobility   Bed Mobility: Supine to Sit;Sit to Supine     Supine to sit: Supervision Sit to supine: Supervision   General bed mobility comments: Supervision for safety. Use of bedrails and HOB elevated, incr time needed  Transfers Overall transfer level: Needs assistance Equipment used: Rolling walker (2 wheeled) Transfers: Sit to/from Stand Sit to Stand: Min guard         General transfer comment: min guard for sit<>stand from bed and low toilet. Increased time and effort required.  Ambulation/Gait Ambulation/Gait assistance: Min guard;Supervision Ambulation Distance (Feet): 15 Feet(x2) Assistive device: Rolling walker (2 wheeled) Gait Pattern/deviations: Step-through pattern;Decreased stride length;Trunk flexed Gait velocity: Decreased  Gait velocity interpretation: Below normal speed for age/gender General Gait Details: min guard to supervision for safety. Pt reporting dizziness and generally  feeling poorly; encouraged  pt to amb further  however pt declines    Stairs            Wheelchair Mobility    Modified Rankin (Stroke Patients Only)       Balance   Sitting-balance support: No upper extremity supported;Feet supported Sitting balance-Leahy Scale: Fair(to good) Sitting balance - Comments: cues for posture and breathing   Standing balance support: Bilateral upper extremity supported;During functional activity Standing balance-Leahy Scale: Fair Standing balance comment: fair static, maintains without UE support; reliant on Ue support for dynamic balance                            Cognition Arousal/Alertness: Awake/alert Behavior During Therapy: WFL for tasks assessed/performed Overall Cognitive Status: Within Functional Limits for tasks assessed                                        Exercises      General Comments        Pertinent Vitals/Pain Pain Assessment: No/denies pain    Home Living                      Prior Function            PT Goals (current goals can now be found in the care plan section) Acute Rehab PT Goals Patient Stated Goal: none stated  PT Goal Formulation: With patient Time For Goal Achievement: 12/27/17 Potential to Achieve Goals: Good Progress towards PT  goals: Progressing toward goals    Frequency    Min 3X/week      PT Plan Current plan remains appropriate    Co-evaluation              AM-PAC PT "6 Clicks" Daily Activity  Outcome Measure  Difficulty turning over in bed (including adjusting bedclothes, sheets and blankets)?: A Little Difficulty moving from lying on back to sitting on the side of the bed? : A Little Difficulty sitting down on and standing up from a chair with arms (e.g., wheelchair, bedside commode, etc,.)?: Unable Help needed moving to and from a bed to chair (including a wheelchair)?: A Little Help needed walking in hospital room?: A  Little Help needed climbing 3-5 steps with a railing? : A Lot 6 Click Score: 15    End of Session Equipment Utilized During Treatment: Gait belt Activity Tolerance: Patient limited by fatigue Patient left: in bed;with call bell/phone within reach;with bed alarm set   PT Visit Diagnosis: Unsteadiness on feet (R26.81);Muscle weakness (generalized) (M62.81)     Time: 1010-1026 PT Time Calculation (min) (ACUTE ONLY): 16 min  Charges:  $Gait Training: 8-22 mins                    G CodesKenyon Ana, PT Pager: [phone] 12/15/2017    Kenyon Ana 12/15/2017, 10:59 AM

## 2017-12-15 NOTE — Care Management Note (Addendum)
Case Management Note  Patient Details  Name: Fernando Jones MRN: [id number] Date of Birth: 1961-05-05  Subjective/Objective:       Admitted with acute on chronic renal failure, new ESRD. Resides with friend [place].              Cleatis Polka (Friend) [place] [phone]      Action/Plan: Transition to home with home health services pending charity approval.  Post hospital follow up scheduled for 12/28/2017 at 9:30 am with Cammie Sickle NP, to establish PCP.  CM to offer Match Letter to assist with Rx meds. Pt states limited funds, no job.  Expected Discharge Date:    12/16/2017             Expected Discharge Plan:  [place]  In-House Referral:  Clinical Social Work/ SCAT application  Discharge planning Services  CM Consult, Follow-up appt scheduled, Medication Assistance, [place] Program  Post Acute Care Choice:  Durable Medical Equipment, Home Health Choice offered to:  NA  DME Arranged:  3-N-1, Walker rolling DME Agency:  [place]:  RN,SW Kathleen:  [place]  Status of Service:  In process, will continue to follow  If discussed at Long Length of Stay Meetings, dates discussed:    Additional Comments:  Sharin Mons, RN 12/15/2017, 4:23 PM

## 2017-12-15 NOTE — Progress Notes (Signed)
Initial Nutrition Assessment  DOCUMENTATION CODES:   Non-severe (moderate) malnutrition in context of chronic illness  INTERVENTION:  Nepro Shake po BID, each supplement provides 425 kcal and 19 grams protein  NUTRITION DIAGNOSIS:   Moderate Malnutrition related to chronic illness as evidenced by moderate fat depletion, moderate muscle depletion  GOAL:   Patient will meet greater than or equal to 90% of their needs  MONITOR:   PO intake, I & O's, Labs, Weight trends, Supplement acceptance  REASON FOR ASSESSMENT:   Consult Assessment of nutrition requirement/status  ASSESSMENT:   57 yo hispanic male with PMH DM, HTN, CKD V, presented with abdominal pain, nausea, vomiting, L sided hydronephrosis and distal ureteral calculus. Patient was noncompliant with hypertension and diabetes medications. Patient now has ESRD, new HD.   Spoke with Mr. Leeson at bedside. He reports eating very little food for 2 weeks prior to admission. He had nausea/vomiting x2 prior to admit. Unsure of weight loss. Only prior weight in chart is 144 pounds 11/17. Weight has fluctuated since he was admitted between 139-143 pounds.  Began process of renal education with patient. Provided potassium and phosphorus handouts from [employer] [facility]. Discussed importance of limiting phosphorus and potassium in diet. Discussed with RN. Information will be attached to patient's discharge packet as well.  Labs reviewed:  Na 133, Phos 2.1 (1/9), BNP 2,214.9 (1/1) Medications reviewed and include:  Senokot-S  NS at 64mL/hr  NUTRITION - FOCUSED PHYSICAL EXAM:    Most Recent Value  Orbital Region  Moderate depletion  Thoracic and Lumbar Region  Moderate depletion  Buccal Region  Moderate depletion  Temple Region  Moderate depletion  Clavicle Bone Region  Moderate depletion  Clavicle and Acromion Bone Region  Severe depletion  Scapular Bone Region  Moderate depletion  Dorsal Hand  Moderate depletion   Patellar Region  Moderate depletion  Anterior Thigh Region  Moderate depletion  Posterior Calf Region  Moderate depletion  Edema (RD Assessment)  Mild  Hair  Reviewed  Eyes  Reviewed  Mouth  Reviewed  Skin  Reviewed  Nails  Reviewed       Diet Order:  Diet renal with fluid restriction Fluid restriction: 1200 mL Fluid; Room service appropriate? Yes; Fluid consistency: Thin  EDUCATION NEEDS:   Education needs have been addressed  Skin:  Skin Assessment: Skin Integrity Issues: Skin Integrity Issues:: Incisions Incisions: Closed incision to R neck, closed incision to R arm  Last BM:  12/13/2017  Height:   Ht Readings from Last 1 Encounters:  10/13/16 5\' 5"  (1.651 m)    Weight:   Wt Readings from Last 1 Encounters:  12/14/17 139 lb 12.4 oz (63.4 kg)    Ideal Body Weight:  56.81 kg  BMI:  Body mass index is 23.26 kg/m.  Estimated Nutritional Needs:   Kcal:  1650-1950 calories  Protein:  82-95 grams (1.3-1.5g/kg)  Fluid:  UOP +1L  Satira Anis. Tyheem Boughner, MS, RD LDN Inpatient Clinical Dietitian Pager [phone]

## 2017-12-15 NOTE — Discharge Instructions (Signed)
Ureteral Stent Implantation, Care After Refer to this sheet in the next few weeks. These instructions provide you with information about caring for yourself after your procedure. Your health care provider may also give you more specific instructions. Your treatment has been planned according to current medical practices, but problems sometimes occur. Call your health care provider if you have any problems or questions after your procedure.  REMOVE THE STENT remove the stent by pulling the string on Monday morning, December 12, 2016 or prior to leaving the hospital.   What can I expect after the procedure? After the procedure, it is common to have:  Nausea.  Mild pain when you urinate. You may feel this pain in your lower back or lower abdomen. Pain should stop within a few minutes after you urinate. This may last for up to 1 week.  A small amount of blood in your urine for several days.  Follow these instructions at home:  Medicines  Take over-the-counter and prescription medicines only as told by your health care provider.  If you were prescribed an antibiotic medicine, take it as told by your health care provider. Do not stop taking the antibiotic even if you start to feel better.  Do not drive for 24 hours if you received a sedative.  Do not drive or operate heavy machinery while taking prescription pain medicines. Activity  Return to your normal activities as told by your health care provider. Ask your health care provider what activities are safe for you.  Do not lift anything that is heavier than 10 lb (4.5 kg). Follow this limit for 1 week after your procedure, or for as long as told by your health care provider. General instructions  Watch for any blood in your urine. Call your health care provider if the amount of blood in your urine increases.  If you have a catheter: ? Follow instructions from your health care provider about taking care of your catheter and collection  bag. ? Do not take baths, swim, or use a hot tub until your health care provider approves.  Drink enough fluid to keep your urine clear or pale yellow.  Keep all follow-up visits as told by your health care provider. This is important. Contact a health care provider if:  You have pain that gets worse or does not get better with medicine, especially pain when you urinate.  You have difficulty urinating.  You feel nauseous or you vomit repeatedly during a period of more than 2 days after the procedure. Get help right away if:  Your urine is dark red or has blood clots in it.  You are leaking urine (have incontinence).  The end of the stent comes out of your urethra.  You cannot urinate.  You have sudden, sharp, or severe pain in your abdomen or lower back.  You have a fever. This information is not intended to replace advice given to you by your health care provider. Make sure you discuss any questions you have with your health care provider. Document Released: 07/25/2013 Document Revised: 04/29/2016 Document Reviewed: 06/06/2015 Elsevier Interactive Patient Education  2018 [employer].    [facility]  Discharge Instructions  AV Fistula or Graft Surgery for Dialysis Access  Please refer to the following instructions for your post-procedure care. Your surgeon or physician assistant will discuss any changes with you.  Activity  You may drive the day following your surgery, if you are comfortable and no longer taking prescription  pain medication. Resume full activity as the soreness in your incision resolves.  Bathing/Showering  You may shower after you go home. Keep your incision dry for 48 hours. Do not soak in a bathtub, hot tub, or swim until the incision heals completely. You may not shower if you have a hemodialysis catheter.  Incision Care  Clean your incision with mild soap and water after 48 hours. Pat the area dry with a clean  towel. You do not need a bandage unless otherwise instructed. Do not apply any ointments or creams to your incision. You may have skin glue on your incision. Do not peel it off. It will come off on its own in about one week. Your arm may swell a bit after surgery. To reduce swelling use pillows to elevate your arm so it is above your heart. Your doctor will tell you if you need to lightly wrap your arm with an ACE bandage.  Diet  Resume your normal diet. There are not special food restrictions following this procedure. In order to heal from your surgery, it is CRITICAL to get adequate nutrition. Your body requires vitamins, minerals, and protein. Vegetables are the best source of vitamins and minerals. Vegetables also provide the perfect balance of protein. Processed food has little nutritional value, so try to avoid this.  Medications  Resume taking all of your medications. If your incision is causing pain, you may take over-the counter pain relievers such as acetaminophen (Tylenol). If you were prescribed a stronger pain medication, please be aware these medications can cause nausea and constipation. Prevent nausea by taking the medication with a snack or meal. Avoid constipation by drinking plenty of fluids and eating foods with high amount of fiber, such as fruits, vegetables, and grains. Do not take Tylenol if you are taking prescription pain medications.     Follow up Your surgeon may want to see you in the office following your access surgery. If so, this will be arranged at the time of your surgery.  Please call us immediately for any of the following conditions:  Increased pain, redness, drainage (pus) from your incision site Fever of 101 degrees or higher Severe or worsening pain at your incision site Hand pain or numbness.  Reduce your risk of vascular disease:  Stop smoking. If you would like help, call [facility] at 1-800-QUIT-NOW (731) 537-7129) or [place] at  [place] your cholesterol Maintain a desired weight Control your diabetes Keep your blood pressure down  Dialysis  It will take several weeks to several months for your new dialysis access to be ready for use. Your surgeon will determine when it is OK to use it. Your nephrologist will continue to direct your dialysis. You can continue to use your Permcath until your new access is ready for use.  If you have any questions, please call the office at [phone].  Dialysis Diet Dialysis is a treatment that cleans your blood. It is used when the kidneys are damaged. When you need dialysis, you should watch your diet. This is because some nutrients can build up in your blood between treatments and make you sick. These nutrients are:  Potassium.  Phosphorus.  Sodium.  Your doctor or dietitian will:  Tell you how much of these you can have.  Tell you if you need to look out for other nutrients too.  Help you plan meals.  Tell you how much to drink each day.  What do I need to know about this diet?  Limit potassium. Potassium is in milk, fruits, and vegetables.  Limit phosphorus. Phosphorus is in milk, cheese, beans, nuts, and carbonated beverages.  Limit salt (sodium). Foods that have a lot sodium include processed and cured meats, ready-made frozen meals, canned vegetables, and salty snack foods.  Do not use salt substitutes.  Try not to eat whole-grain foods and foods that have a lot of fiber.  Follow your doctor's instructions about how much to drink. You may be told to: ? Write down what you drink. ? Write down foods you eat that are made mostly from water, such as gelatin and soups. ? Drink from small cups.  Ask your doctor if you should take a medicine that binds phosphorus.  Take vitamin and mineral supplements only as told by your doctor.  Eat meat, poultry, fish, and eggs. Limit nuts and beans.  Before you cook potatoes, cut them into small  pieces. Then boil them in unsalted water.  Drain all fluid from cooked vegetables and canned fruits before you eat them. What foods can I eat? Grains White bread. White rice. Cooked cereal. Unsalted popcorn. Tortillas. Pasta. Vegetables Fresh or frozen broccoli, carrots, and green beans. Cabbage. Cauliflower. Celery. Cucumbers. Eggplant. Radishes. Zucchini. Fruits Apples. Fresh or frozen berries. Fresh or canned pears, peaches, and pineapple. Grapes. Plums. Meats and Other Protein Sources Fresh or frozen beef, pork, chicken, and fish. Eggs. Low-sodium canned tuna or salmon. Dairy Cream cheese. Heavy cream. Ricotta cheese. Beverages Apple cider. Cranberry juice. Grape juice. Lemonade. Black coffee. Condiments Herbs. Spices. Jam and jelly. Honey. Sweets and Desserts Sherbet. Cakes. Cookies. Fats and Oils Olive oil, canola oil, and safflower oil. Other Non-dairy creamer. Non-dairy whipped topping. Homemade broth without salt. The items listed above may not be a complete list of recommended foods or beverages. Contact your dietitian for more options. What foods are not recommended? Grains Whole-grain bread. Whole-grain pasta. High-fiber cereal. Vegetables Potatoes. Beets. Tomatoes. Winter squash and pumpkin. Asparagus. Spinach. Parsnips. Fruits Star fruit. Bananas. Oranges. Kiwi. Nectarines. Prunes. Melon. Dried fruit. Avocado. Meats and Other Protein Sources Canned, smoked, and cured meats. Soil scientist. Sardines. Nuts and seeds. Peanut butter. Beans and legumes. Dairy Milk. Buttermilk. Yogurt. Cheese and cottage cheese. Processed cheese spreads. Beverages Orange juice. Prune juice. Carbonated soft drinks. Condiments Salt. Salt substitutes. Soy sauce. Sweets and Desserts Ice cream. Chocolate. Candied nuts. Fats and Oils Butter. Margarine. Other Ready-made frozen meals. Canned soups. The items listed above may not be a complete list of foods and beverages to avoid.  Contact your dietitian for more information. This information is not intended to replace advice given to you by your health care provider. Make sure you discuss any questions you have with your health care provider. Document Released: 05/23/2012 Document Revised: 04/29/2016 Document Reviewed: 06/25/2014 Elsevier Interactive Patient Education  [employer].

## 2017-12-15 NOTE — Progress Notes (Signed)
Accepted at [place] .1st treatment Saturday ,January 12,2019 at 12:00pm .Please go to the clinic on 12/16/17 before 4:00pm to sign paperwork .Schedule and chairtime Tuesday,Thursday,Saturday at 12:00pm

## 2017-12-15 NOTE — Progress Notes (Signed)
[facility] ROUNDING NOTE   Subjective:   41M new ESRD, anemia, obstructing L kidney stone  Patient completed Tx #5CLIP in progress  No issues today   Patient will need dialysis in AM    Objective:  Vital signs in last 24 hours:  Temp:  [98.1 F (36.7 C)-99 F (37.2 C)] 98.9 F (37.2 C) (01/10 0530) Pulse Rate:  [64-74] 71 (01/10 0530) Resp:  [9-14] 12 (01/10 0530) BP: (96-147)/(40-65) 147/65 (01/10 0530) SpO2:  [97 %-100 %] 99 % (01/10 0530) Weight:  [139 lb 12.4 oz (63.4 kg)] 139 lb 12.4 oz (63.4 kg) (01/09 1115)  Weight change: -3 oz (-1.9 kg) Filed Weights   12/13/17 0441 12/14/17 0656 12/14/17 1115  Weight: 140 lb 6.9 oz (63.7 kg) 143 lb 15.4 oz (65.3 kg) 139 lb 12.4 oz (63.4 kg)    Intake/Output: I/O last 3 completed shifts: In: 1275 [P.O.:928; I.V.:3; Blood:315] Out: 2000 [Other:2000]   Intake/Output this shift:  No intake/output data recorded.  RRR no rub, nl s1s2 Coarse bs b/l\ Trace LEE +B of R FA AVF TDC present in R IJ, bandaged     Basic Metabolic Panel: Recent Labs  Lab 12/09/17 0207 12/10/17 1325 12/12/17 0730 12/14/17 0744  NA 136 136 135 133*  K 3.8 4.1 3.8 3.5  CL 103 101 97* 98*  CO2 23 26 27 27   GLUCOSE 109* 113* 99 91  BUN 75* 45* 30* 33*  CREATININE 10.48* 8.47* 6.76* 6.27*  CALCIUM 6.5* 7.2* 7.9* 7.7*  PHOS 5.0* 4.2 2.7 2.1*    Liver Function Tests: Recent Labs  Lab 12/09/17 0207 12/10/17 1325 12/12/17 0730 12/14/17 0744  ALBUMIN 2.5* 2.5* 2.3* 2.3*   No results for input(s): LIPASE, AMYLASE in the last 168 hours. No results for input(s): AMMONIA in the last 168 hours.  CBC: Recent Labs  Lab 12/09/17 0207 12/10/17 0305 12/11/17 1147 12/14/17 0745 12/15/17 0314  WBC 4.7 8.6 9.2 12.3* 10.0  HGB 8.0* 8.0* 8.1* 7.9* 9.3*  HCT 24.5* 25.0* 26.1* 26.0* 29.4*  MCV 88.8 90.6 93.9 95.9 95.1  PLT 233 205 226 262 222    Cardiac Enzymes: No results for input(s): CKTOTAL, CKMB, CKMBINDEX, TROPONINI  in the last 168 hours.  BNP: Invalid input(s): POCBNP  CBG: Recent Labs  Lab 12/14/17 1618 12/14/17 2116 12/14/17 2359 12/15/17 0408 12/15/17 0805  GLUCAP 181* 164* 109* 87 84    Microbiology: No results found for this or any previous visit.  Coagulation Studies: No results for input(s): LABPROT, INR in the last 72 hours.  Urinalysis: No results for input(s): COLORURINE, LABSPEC, PHURINE, GLUCOSEU, HGBUR, BILIRUBINUR, KETONESUR, PROTEINUR, UROBILINOGEN, NITRITE, LEUKOCYTESUR in the last 72 hours.  Invalid input(s): APPERANCEUR    Imaging: No results found.   Medications:   . sodium chloride    . sodium chloride 10 mL/hr at 12/09/17 1014  . sodium chloride    . sodium chloride     . aspirin EC  81 mg Oral Daily  . carvedilol  12.5 mg Oral BID WC  . darbepoetin (ARANESP) injection - DIALYSIS  200 mcg Intravenous Q Thu-HD  . doxercalciferol  2 mcg Intravenous Q M,W,F-HD  . heparin  5,000 Units Subcutaneous Q8H  . hydrALAZINE  25 mg Oral Q8H  . pantoprazole  40 mg Oral BID AC  . senna-docusate  1 tablet Oral BID  . sodium chloride flush  3 mL Intravenous Q12H  . tamsulosin  0.4 mg Oral Daily   sodium chloride, sodium chloride,  sodium chloride, acetaminophen **OR** acetaminophen, alteplase, heparin, heparin, heparin, hydrALAZINE, HYDROmorphone (DILAUDID) injection, lidocaine (PF), lidocaine (PF), lidocaine-prilocaine, ondansetron **OR** ondansetron (ZOFRAN) IV, pentafluoroprop-tetrafluoroeth, sodium chloride flush, sorbitol, traMADol  Assessment/ Plan:  1. New ESRD 1. S/p TDC and AVF 1/4  2. CLIP pending 3. S/p5incremental HD Tx, now full Tx: Qb400,4h,UF to 63kg not to exceed 2.5L, use TDC, no heparin,2K  2. obstructuer L ureteral stone s/p removeal 12/08/17 with urology; ureteral stent to be removed 1/7 3. BMD: Low Ca, inc P; [id number] 1/2On Tums + C3Start hectoral 51mcg q HD 4. Anemia: ESA started + IV Fe, follow 5. HTN: on carvedilol and hydralazine,  needs probing for lower post HD weights      LOS: 9 Malachy Coleman W @TODAY @10 :06 AM

## 2017-12-15 NOTE — Progress Notes (Signed)
PROGRESS NOTE  Fernando Jones DVV:[id number] DOB: 1961-09-23 DOA: 12/06/2017 PCP: Patient, No Pcp Per   LOS: 9 days   Brief Narrative / Interim history: Patient is a 57 y.o. Hispanic male, with past medical history significant for diabetes mellitus, hypertension, CKD V. Patient presented with abdominal pain, nausea and vomiting. CT showed mild left sided hydronephrosis and distal ureteral calculus, possible ascending colon wall thickening, bilateral pleural effusions. Labs showed BUN 36, creatinine 15.16.  Nephrology and urology were consulted. Patient was non compliant with his medication.  Work up revealed acute on chronic CKD versus new ESRD. Patient has been undergoing HD, and now awaiting chair. Patient underwent stone extraction and stent placement 12/08/17.   Assessment & Plan: Active Problems:   DM (diabetes mellitus), type 2 with renal complications (HCC)   Essential hypertension   ESRD (end stage renal disease) (HCC)  End-stage renal disease, new HD patient -Patient's previous creatinine from 2017 was around 5, on admission 15.17.  He has been noncompliant with his home medications.  BUN is 136, and seems to have symptomatic uremic symptoms with transient episodes of confusion. -Nephrology consulted, patient had dialysis catheter placed, converted to tunneled catheter 1-04.Marland Kitchen He underwent HD 1-02. -Awaiting HD Chair Underwent AV fistula 12/09/2017.  Continue with hemodialysis   Nausea, vomiting: Resolved.  Abdominal pain has improved. .  KUB; finding with constipation. Tap water enema ordered.  PPI  Weakness: Vitamin D low. Started on hecterol with HD>  PT evaluation  Transfuse one unit PRBC  Hypertension -Continue home medications with Coreg and hydralazine.   -improved controlled.   Anemia; iron deficiency. Chronic diseases (CKD).  Needs out patient colonoscopy.  Needs iron tablet at discharge  On aranesp. Received IV iron.  Transfuse one unit PRBC with HD ESA  with HD  Left ureteral stone/hydronephrosis:  -Urology consulted.  Un underwent cystoscopy , stone extraction and stent placement 1-03. He report improvement of pain and nausea.  Develops urine retention. Had foley catheter placed.  Started on flomax.  Had stent and foley catheter removed 1-07.    Elevated troponin -0.05>> 0.06>> 0.07, overall flat, not in a pattern consistent with ACS -No chest pain  History of diabetes mellitus -Last hemoglobin A1c from 2017 was 5.3.  Repeat hemoglobin A1c now 4.6.   ?  Colitis -CT scan concerning for thickening of the ascending colon, however patient is asymptomatic -Denies abdominal pain, denies diarrhea.   DVT prophylaxis: heparin Code Status: Full code Family Communication: no family at bedside Disposition Plan: home when ready   Consultants:   Nephrology  Urology  Procedures:   Tunneled HD cath placement 1/2  Antimicrobials:  None  Subjective: He is feeling weak.      Objective: Vitals:   12/14/17 1733 12/14/17 2153 12/14/17 2241 12/15/17 0530  BP: (!) 111/45 (!) 118/44 (!) 134/57 (!) 147/65  Pulse: 68  74 71  Resp: 10  12 12   Temp: 99 F (37.2 C)  99 F (37.2 C) 98.9 F (37.2 C)  TempSrc: Oral  Oral Oral  SpO2:   100% 99%  Weight:        Intake/Output Summary (Last 24 hours) at 12/15/2017 1026 Last data filed at 12/14/2017 2156 Gross per 24 hour  Intake 828 ml  Output 2000 ml  Net -1172 ml   Filed Weights   12/13/17 0441 12/14/17 0656 12/14/17 1115  Weight: 63.7 kg (140 lb 6.9 oz) 65.3 kg (143 lb 15.4 oz) 63.4 kg (139 lb 12.4 oz)  Examination:  Constitutional: NAD Respiratory: CTA Cardiovascular ; S 1, S 2 RRR Abdomen: BS present, soft, nt Neurologic ; non focal.  Extremities; right arm with incision after AV fistula sx.     Data Reviewed: I have independently reviewed following labs and imaging studies   CBC: Recent Labs  Lab 12/09/17 0207 12/10/17 0305 12/11/17 1147 12/14/17 0745  12/15/17 0314  WBC 4.7 8.6 9.2 12.3* 10.0  HGB 8.0* 8.0* 8.1* 7.9* 9.3*  HCT 24.5* 25.0* 26.1* 26.0* 29.4*  MCV 88.8 90.6 93.9 95.9 95.1  PLT 233 205 226 262 294   Basic Metabolic Panel: Recent Labs  Lab 12/09/17 0207 12/10/17 1325 12/12/17 0730 12/14/17 0744  NA 136 136 135 133*  K 3.8 4.1 3.8 3.5  CL 103 101 97* 98*  CO2 23 26 27 27   GLUCOSE 109* 113* 99 91  BUN 75* 45* 30* 33*  CREATININE 10.48* 8.47* 6.76* 6.27*  CALCIUM 6.5* 7.2* 7.9* 7.7*  PHOS 5.0* 4.2 2.7 2.1*   GFR: CrCl cannot be calculated (Unknown ideal weight.). Liver Function Tests: Recent Labs  Lab 12/09/17 0207 12/10/17 1325 12/12/17 0730 12/14/17 0744  ALBUMIN 2.5* 2.5* 2.3* 2.3*   No results for input(s): LIPASE, AMYLASE in the last 168 hours. No results for input(s): AMMONIA in the last 168 hours. Coagulation Profile: No results for input(s): INR, PROTIME in the last 168 hours. Cardiac Enzymes: No results for input(s): CKTOTAL, CKMB, CKMBINDEX, TROPONINI in the last 168 hours. BNP (last 3 results) No results for input(s): PROBNP in the last 8760 hours. HbA1C: No results for input(s): HGBA1C in the last 72 hours. CBG: Recent Labs  Lab 12/14/17 1618 12/14/17 2116 12/14/17 2359 12/15/17 0408 12/15/17 0805  GLUCAP 181* 164* 109* 87 84   Lipid Profile: No results for input(s): CHOL, HDL, LDLCALC, TRIG, CHOLHDL, LDLDIRECT in the last 72 hours. Thyroid Function Tests: No results for input(s): TSH, T4TOTAL, FREET4, T3FREE, THYROIDAB in the last 72 hours. Anemia Panel: No results for input(s): VITAMINB12, FOLATE, FERRITIN, TIBC, IRON, RETICCTPCT in the last 72 hours. Urine analysis:    Component Value Date/Time   COLORURINE YELLOW 12/06/2017 1339   APPEARANCEUR CLEAR 12/06/2017 1339   LABSPEC 1.011 12/06/2017 1339   PHURINE 5.0 12/06/2017 1339   GLUCOSEU 50 (A) 12/06/2017 1339   HGBUR MODERATE (A) 12/06/2017 1339   BILIRUBINUR NEGATIVE 12/06/2017 1339   KETONESUR NEGATIVE 12/06/2017  1339   PROTEINUR >=300 (A) 12/06/2017 1339   NITRITE NEGATIVE 12/06/2017 1339   LEUKOCYTESUR NEGATIVE 12/06/2017 1339   Sepsis Labs: Invalid input(s): PROCALCITONIN, LACTICIDVEN  No results found for this or any previous visit (from the past 240 hour(s)).    Radiology Studies: No results found.   Scheduled Meds: . aspirin EC  81 mg Oral Daily  . carvedilol  12.5 mg Oral BID WC  . darbepoetin (ARANESP) injection - DIALYSIS  200 mcg Intravenous Q Thu-HD  . doxercalciferol  2 mcg Intravenous Q M,W,F-HD  . heparin  5,000 Units Subcutaneous Q8H  . hydrALAZINE  25 mg Oral Q8H  . pantoprazole  40 mg Oral BID AC  . senna-docusate  1 tablet Oral BID  . sodium chloride flush  3 mL Intravenous Q12H  . tamsulosin  0.4 mg Oral Daily   Continuous Infusions: . sodium chloride    . sodium chloride 10 mL/hr at 12/09/17 1014  . sodium chloride    . sodium chloride      Niel Hummer, MD [facility] Pager [phone]  If 7PM-7AM, please  contact night-coverage [email or web address] Password [email or web address] 12/15/2017, 10:26 AM

## 2017-12-15 NOTE — Telephone Encounter (Signed)
-----   Message from Mena Goes, RN sent at 12/09/2017  1:40 PM EST ----- Regarding: 4-6 weeks   ----- Message ----- From: Iline Oven Sent: 12/09/2017   1:22 PM To: Vvs Charge Pool  Can you schedule a follow up appt for this pt in about 4-6 weeks with Dr. Donnetta Hutching.  No need for fistula duplex.  R radiocephalic fistula. Thanks, [facility]

## 2017-12-16 DIAGNOSIS — Z9119 Patient's noncompliance with other medical treatment and regimen: Secondary | ICD-10-CM

## 2017-12-16 DIAGNOSIS — N19 Unspecified kidney failure: Secondary | ICD-10-CM

## 2017-12-16 DIAGNOSIS — I160 Hypertensive urgency: Secondary | ICD-10-CM

## 2017-12-16 LAB — GLUCOSE, CAPILLARY
Glucose-Capillary: 147 mg/dL — ABNORMAL HIGH (ref 65–99)
Glucose-Capillary: 115 mg/dL — ABNORMAL HIGH (ref 65–99)
Glucose-Capillary: 84 mg/dL (ref 65–99)
Glucose-Capillary: 72 mg/dL (ref 65–99)
Glucose-Capillary: 116 mg/dL — ABNORMAL HIGH (ref 65–99)

## 2017-12-16 LAB — CBC
HCT: 28.4 % — ABNORMAL LOW (ref 39.0–52.0)
HEMOGLOBIN: 8.7 g/dL — AB (ref 13.0–17.0)
MCH: 29 pg (ref 26.0–34.0)
MCHC: 30.6 g/dL (ref 30.0–36.0)
MCV: 94.7 fL (ref 78.0–100.0)
Platelets: 234 10*3/uL (ref 150–400)
RBC: 3 MIL/uL — AB (ref 4.22–5.81)
RDW: 15.4 % (ref 11.5–15.5)
WBC: 9.5 10*3/uL (ref 4.0–10.5)

## 2017-12-16 LAB — RENAL FUNCTION PANEL
ANION GAP: 7 (ref 5–15)
Albumin: 2.3 g/dL — ABNORMAL LOW (ref 3.5–5.0)
BUN: 44 mg/dL — ABNORMAL HIGH (ref 6–20)
CALCIUM: 7.4 mg/dL — AB (ref 8.9–10.3)
CO2: 27 mmol/L (ref 22–32)
Chloride: 101 mmol/L (ref 101–111)
Creatinine, Ser: 6.44 mg/dL — ABNORMAL HIGH (ref 0.61–1.24)
GFR calc non Af Amer: 9 mL/min — ABNORMAL LOW (ref >60)
GFR, EST AFRICAN AMERICAN: 10 mL/min — AB (ref >60)
Glucose, Bld: 82 mg/dL (ref 65–99)
Phosphorus: 3.3 mg/dL (ref 2.5–4.6)
Potassium: 4.1 mmol/L (ref 3.5–5.1)
SODIUM: 135 mmol/L (ref 135–145)

## 2017-12-16 MED ORDER — TAMSULOSIN HCL 0.4 MG PO CAPS: 0.4000 mg | ORAL_CAPSULE | Freq: Every day | ORAL | 0 refills | Status: DC

## 2017-12-16 MED ORDER — DARBEPOETIN ALFA 200 MCG/0.4ML IJ SOSY: 200.0000 ug | PREFILLED_SYRINGE | INTRAMUSCULAR | 0 refills | Status: DC

## 2017-12-16 MED ORDER — LIDOCAINE-PRILOCAINE 2.5-2.5 % EX CREA
1.0000 "application " | TOPICAL_CREAM | CUTANEOUS | Status: DC | PRN
  Filled 2017-12-16: qty 5

## 2017-12-16 MED ORDER — LIDOCAINE HCL (PF) 1 % IJ SOLN
5.0000 mL | INTRAMUSCULAR | Status: DC | PRN
  Filled 2017-12-16: qty 5

## 2017-12-16 MED ORDER — DOXERCALCIFEROL 4 MCG/2ML IV SOLN
INTRAVENOUS | Status: AC
  Administered 2017-12-16: 2 ug via INTRAVENOUS
  Filled 2017-12-16: qty 2

## 2017-12-16 MED ORDER — CARVEDILOL 12.5 MG PO TABS: 12.5000 mg | ORAL_TABLET | Freq: Two times a day (BID) | ORAL | 1 refills | Status: DC

## 2017-12-16 MED ORDER — ASPIRIN 81 MG PO TBEC: 81.0000 mg | DELAYED_RELEASE_TABLET | Freq: Every day | ORAL | 1 refills | Status: DC

## 2017-12-16 MED ORDER — SODIUM CHLORIDE 0.9 % IV SOLN: 100.0000 mL | INTRAVENOUS | Status: DC | PRN

## 2017-12-16 MED ORDER — HEPARIN SODIUM (PORCINE) 1000 UNIT/ML DIALYSIS
1000.0000 [IU] | INTRAMUSCULAR | Status: DC | PRN
  Filled 2017-12-16: qty 1

## 2017-12-16 MED ORDER — PANTOPRAZOLE SODIUM 40 MG PO TBEC: 40.0000 mg | DELAYED_RELEASE_TABLET | Freq: Two times a day (BID) | ORAL | 0 refills | Status: DC

## 2017-12-16 MED ORDER — ALTEPLASE 2 MG IJ SOLR
2.0000 mg | Freq: Once | INTRAMUSCULAR | Status: DC | PRN
Start: 2017-12-16 — End: 2017-12-16
  Filled 2017-12-16: qty 2

## 2017-12-16 MED ORDER — DARBEPOETIN ALFA 200 MCG/0.4ML IJ SOSY: PREFILLED_SYRINGE | INTRAMUSCULAR | 0 refills | Status: DC

## 2017-12-16 MED ORDER — SENNOSIDES-DOCUSATE SODIUM 8.6-50 MG PO TABS: 1.0000 | ORAL_TABLET | Freq: Two times a day (BID) | ORAL | 0 refills | Status: DC

## 2017-12-16 MED ORDER — DOXERCALCIFEROL 4 MCG/2ML IV SOLN: 2.0000 ug | INTRAVENOUS | 0 refills | Status: AC

## 2017-12-16 MED ORDER — NEPRO/CARBSTEADY PO LIQD: 237.0000 mL | Freq: Two times a day (BID) | ORAL | 1 refills | Status: DC

## 2017-12-16 MED ORDER — HYDRALAZINE HCL 25 MG PO TABS: 25.0000 mg | ORAL_TABLET | Freq: Three times a day (TID) | ORAL | 0 refills | Status: DC

## 2017-12-16 MED ORDER — DARBEPOETIN ALFA 200 MCG/0.4ML IJ SOSY
PREFILLED_SYRINGE | INTRAMUSCULAR | Status: AC
  Filled 2017-12-16: qty 0.4

## 2017-12-16 MED ORDER — PENTAFLUOROPROP-TETRAFLUOROETH EX AERO: 1.0000 "application " | INHALATION_SPRAY | CUTANEOUS | Status: DC | PRN

## 2017-12-16 MED FILL — TAMSULOSIN HCL 0.4 MG CAP: 0.4 | 30 days supply | Qty: 30 | Fill #0

## 2017-12-16 MED FILL — PANTOPRAZOLE SOD DR 40 MG T: 40 | 30 days supply | Qty: 60 | Fill #0

## 2017-12-16 MED FILL — CARVEDILOL 12.5 MG TABLET: 12.5 | 30 days supply | Qty: 60 | Fill #0

## 2017-12-16 MED FILL — ?HYDRALAZINE 25MG TAB: 25 | 30 days supply | Qty: 90 | Fill #0

## 2017-12-16 NOTE — Progress Notes (Signed)
[facility] ROUNDING NOTE   Subjective:   69M new ESRD, anemia, obstructing L kidney stone  Patient completed Tx # 6   CLIP in progress  No issues today   Seen on dialysis       Objective:  Vital signs in last 24 hours:  Temp:  [97.3 F (36.3 C)-99 F (37.2 C)] 98.4 F (36.9 C) (01/11 0811) Pulse Rate:  [72-87] 83 (01/11 0900) Resp:  [10-18] 12 (01/11 0900) BP: (120-185)/(47-90) 169/83 (01/11 0900) SpO2:  [99 %-100 %] 99 % (01/11 0454) Weight:  [147 lb 7.8 oz (66.9 kg)] 147 lb 7.8 oz (66.9 kg) (01/11 0800)  Weight change:  Filed Weights   12/14/17 0656 12/14/17 1115 12/16/17 0800  Weight: 143 lb 15.4 oz (65.3 kg) 139 lb 12.4 oz (63.4 kg) 147 lb 7.8 oz (66.9 kg)    Intake/Output: I/O last 3 completed shifts: In: 76 [P.O.:480; I.V.:6] Out: -    Intake/Output this shift:  No intake/output data recorded.  RRR no rub, nl s1s2 Coarse bs b/l\ Trace LEE +B of R FA AVF TDC present in R IJ, bandaged     Basic Metabolic Panel: Recent Labs  Lab 12/10/17 1325 12/12/17 0730 12/14/17 0744 12/16/17 0910  NA 136 135 133* 135  K 4.1 3.8 3.5 4.1  CL 101 97* 98* 101  CO2 26 27 27 27   GLUCOSE 113* 99 91 82  BUN 45* 30* 33* 44*  CREATININE 8.47* 6.76* 6.27* 6.44*  CALCIUM 7.2* 7.9* 7.7* 7.4*  PHOS 4.2 2.7 2.1* 3.3    Liver Function Tests: Recent Labs  Lab 12/10/17 1325 12/12/17 0730 12/14/17 0744 12/16/17 0910  ALBUMIN 2.5* 2.3* 2.3* 2.3*   No results for input(s): LIPASE, AMYLASE in the last 168 hours. No results for input(s): AMMONIA in the last 168 hours.  CBC: Recent Labs  Lab 12/10/17 0305 12/11/17 1147 12/14/17 0745 12/15/17 0314 12/16/17 0910  WBC 8.6 9.2 12.3* 10.0 9.5  HGB 8.0* 8.1* 7.9* 9.3* 8.7*  HCT 25.0* 26.1* 26.0* 29.4* 28.4*  MCV 90.6 93.9 95.9 95.1 94.7  PLT 205 226 262 222 234    Cardiac Enzymes: No results for input(s): CKTOTAL, CKMB, CKMBINDEX, TROPONINI in the last 168 hours.  BNP: Invalid input(s):  POCBNP  CBG: Recent Labs  Lab 12/15/17 1627 12/15/17 2225 12/16/17 0136 12/16/17 0453 12/16/17 0738  GLUCAP 103* 147* 115* 84 72    Microbiology: No results found for this or any previous visit.  Coagulation Studies: No results for input(s): LABPROT, INR in the last 72 hours.  Urinalysis: No results for input(s): COLORURINE, LABSPEC, PHURINE, GLUCOSEU, HGBUR, BILIRUBINUR, KETONESUR, PROTEINUR, UROBILINOGEN, NITRITE, LEUKOCYTESUR in the last 72 hours.  Invalid input(s): APPERANCEUR    Imaging: No results found.   Medications:   . sodium chloride    . sodium chloride 10 mL/hr at 12/09/17 1014  . sodium chloride    . sodium chloride    . sodium chloride    . sodium chloride     . aspirin EC  81 mg Oral Daily  . carvedilol  12.5 mg Oral BID WC  . darbepoetin (ARANESP) injection - DIALYSIS  200 mcg Intravenous Q Fri-HD  . doxercalciferol  2 mcg Intravenous Q M,W,F-HD  . feeding supplement (NEPRO CARB STEADY)  237 mL Oral BID BM  . heparin  5,000 Units Subcutaneous Q8H  . hydrALAZINE  25 mg Oral Q8H  . pantoprazole  40 mg Oral BID AC  . senna-docusate  1 tablet Oral  BID  . sodium chloride flush  3 mL Intravenous Q12H  . tamsulosin  0.4 mg Oral Daily   sodium chloride, sodium chloride, sodium chloride, sodium chloride, sodium chloride, acetaminophen **OR** acetaminophen, alteplase, alteplase, heparin, heparin, heparin, heparin, hydrALAZINE, HYDROmorphone (DILAUDID) injection, lidocaine (PF), lidocaine (PF), lidocaine (PF), lidocaine-prilocaine, lidocaine-prilocaine, ondansetron **OR** ondansetron (ZOFRAN) IV, pentafluoroprop-tetrafluoroeth, pentafluoroprop-tetrafluoroeth, sodium chloride flush, sorbitol, traMADol  Assessment/ Plan:  1. New ESRD 1. S/p TDC and AVF 1/4  2. CLIP pending 3. S/p5incremental HD Tx, now full Tx: Qb400,4h,UF to 63kg not to exceed 2.5L, use TDC, no heparin,2K  2. obstructuer L ureteral stone s/p removeal 12/08/17 with urology; ureteral  stent to be removed 1/7 3. BMD: Low Ca, inc P; PTH404 1/2On Tums + C3Start hectoral 39mcg q HD 4. Anemia: ESA started + IV Fe, follow 5. HTN: on carvedilol and hydralazine, needs probing for lower post HD weights      LOS: 10 Cydne Grahn W @TODAY @11 :19 AM

## 2017-12-16 NOTE — Progress Notes (Signed)
Physical Therapy Treatment Patient Details Name: Fernando Jones MRN: [id number] DOB: Oct 19, 1961 Today's Date: 12/16/2017    History of Present Illness Pt is a 57 y/o male admitted secondary to LUQ pain. Found to have L kidney stone and new onset ESRD. Pt is s/p cystoscopy and stent placement on 1/3, and R AV fistula creation and R IJ catheter placement on 1/4. PMH includes DM, HTN, tobacco abuse, and CKD 5.     PT Comments    Pt with slow progress towards goals. Was able to increase gait distance with max encouragement, however, continues to be limited by fatigue. Question if pt is self limiting with mobility? Educated about effects of dialysis and importance of mobility. Per case management and CSW, pt not eligible for SNF, however, continue to feel he would benefit. Since not eligible, will need max HH services to ensure safety at home. Will continue to follow acutely to maximize functional mobility independence and safety.    Follow Up Recommendations  SNF;Supervision/Assistance - 24 hour     Equipment Recommendations  Rolling walker with 5" wheels;3in1 (PT)    Recommendations for Other Services       Precautions / Restrictions Precautions Precautions: Fall Restrictions Weight Bearing Restrictions: No    Mobility  Bed Mobility Overal bed mobility: Needs Assistance Bed Mobility: Sit to Supine       Sit to supine: Supervision   General bed mobility comments: Supervision for safety. Increased time required.   Transfers Overall transfer level: Needs assistance Equipment used: Rolling walker (2 wheeled) Transfers: Sit to/from Stand Sit to Stand: Supervision         General transfer comment: Supervision for safety. Educated about safe hand placement, however, pt disregarding verbal cues and pulling up on RW. PT required to brace RW.   Ambulation/Gait Ambulation/Gait assistance: Min guard;Supervision Ambulation Distance (Feet): 75 Feet Assistive device: Rolling  walker (2 wheeled) Gait Pattern/deviations: Step-through pattern;Decreased stride length;Trunk flexed Gait velocity: Decreased  Gait velocity interpretation: Below normal speed for age/gender General Gait Details: Min guard to supervision for safety. Fatigues very quickly. Educated about importance of mobility and wanted to perform further gait distance following seated rest, however, pt refusing and pushing walker away stating "I'm too tired." Pt likely self limiting.    Stairs            Wheelchair Mobility    Modified Rankin (Stroke Patients Only)       Balance Overall balance assessment: Needs assistance Sitting-balance support: No upper extremity supported;Feet supported Sitting balance-Leahy Scale: Good     Standing balance support: Bilateral upper extremity supported;During functional activity Standing balance-Leahy Scale: Fair Standing balance comment: fair static, maintains without UE support; reliant on Ue support for dynamic balance                            Cognition Arousal/Alertness: Awake/alert Behavior During Therapy: WFL for tasks assessed/performed Overall Cognitive Status: Within Functional Limits for tasks assessed                                        Exercises      General Comments General comments (skin integrity, edema, etc.): Educated about effects of dialysis and importance of mobility.       Pertinent Vitals/Pain Pain Assessment: No/denies pain    Home Living  Prior Function            PT Goals (current goals can now be found in the care plan section) Acute Rehab PT Goals Patient Stated Goal: none stated  PT Goal Formulation: With patient Time For Goal Achievement: 12/27/17 Potential to Achieve Goals: Good Progress towards PT goals: Progressing toward goals    Frequency    Min 3X/week      PT Plan Current plan remains appropriate    Co-evaluation               AM-PAC PT "6 Clicks" Daily Activity  Outcome Measure  Difficulty turning over in bed (including adjusting bedclothes, sheets and blankets)?: A Little Difficulty moving from lying on back to sitting on the side of the bed? : A Little Difficulty sitting down on and standing up from a chair with arms (e.g., wheelchair, bedside commode, etc,.)?: A Little Help needed moving to and from a bed to chair (including a wheelchair)?: A Little Help needed walking in hospital room?: A Little Help needed climbing 3-5 steps with a railing? : A Lot 6 Click Score: 17    End of Session Equipment Utilized During Treatment: Gait belt Activity Tolerance: Patient limited by fatigue Patient left: in bed;with call bell/phone within reach;with bed alarm set Nurse Communication: Mobility status PT Visit Diagnosis: Unsteadiness on feet (R26.81);Muscle weakness (generalized) (M62.81)     Time: 1442-1500 PT Time Calculation (min) (ACUTE ONLY): 18 min  Charges:  $Gait Training: 8-22 mins                    G Codes:       Leighton Ruff, PT, DPT  Acute Rehabilitation Services  Pager: [phone]    Rudean Hitt 12/16/2017, 3:55 PM

## 2017-12-16 NOTE — Progress Notes (Signed)
CSW received consult for SCAT application and taxi voucher. CSW put voucher in chart and completed application with patient.  CSW signing off  .Percell Locus Rosalva Neary LCSWA [phone]

## 2017-12-16 NOTE — Care Management Note (Addendum)
Case Management Note  Patient Details  Name: Fernando Jones MRN: [id number] Date of Birth: [birth year]  Subjective/Objective:                  Admitted with acute on chronic renal failure, new ESRD. Resides with friend [place].              Cleatis Polka (Friend) [place] [phone]     Post hospital follow up scheduled for 12/28/2017 at 9:30 am with Cammie Sickle NP, to establish PCP  Action/Plan: Transition to home today with home health services (RN,SW) to follow. Pt states he will care for self once home, will ask friend for help when he's home if needed.  Rx meds faxed to [facility] pharmacy/ Match Letter given to assist medication needs. CM pick up filled medication from pharmacy for nurse to give to pt.  Taxi voucher given to pt to assist with transportation to home.  Pt states he will utilize taxi service on 12/16/2017 to get to dialysis center.  Expected Discharge Date:  12/16/17               Expected Discharge Plan:  [place]  In-House Referral:  Clinical Social [facility]), SCAT application in process for   Discharge planning Services  CM Consult, Follow-up appt scheduled, Medication Assistance, [place] Program  Post Acute Care Choice:  Durable Medical Equipment, Home Health Choice offered to:  NA  DME Arranged:  3-N-1, Walker rolling DME Agency:  [place]:  RN, Social Work [employer] Agency:  [place]  Status of Service:  Completed, signed off  If discussed at [employer], dates discussed:    Additional Comments:  Sharin Mons, RN 12/16/2017, 3:22 PM

## 2017-12-16 NOTE — Discharge Summary (Signed)
Physician Discharge Summary  Patient ID: Fernando Jones MRN: [id number] DOB/AGE: May 24, 1961 57 y.o.  Admit date: 12/06/2017 Discharge date: 12/16/2017  Admission Diagnoses:  Discharge Diagnoses:  Active Problems:   ESRD (end stage renal disease) ([place])   DM (diabetes mellitus), type 2 with renal complications (HCC)   Essential hypertension   Malnutrition of moderate degree   Discharged Condition: stable  Hospital Course: Patient is a 89 year oldHispanicmale,with past medical history significant for diabetes mellitus, hypertension, CKD V. Patient was admitted with uremia and ESRD (New diagnosis). On admission, patient's BUN was 136 and the serum creatinine was 15.16. Patient's BP was uncontrolled. Apparently, patient was lost to follow up. Patient was admitted and started on Hemodialysis. Out patient chair has been arranged, and patient will continue Hemodialysis three times weekly. Patient's BP has been optimized. Need to comply with medical management has been discussed with the patient extensively.   Consults: nephrology  Treatments: Started Hemodialysis  Discharge Exam: Blood pressure (!) 157/66, pulse 84, temperature 98.6 F (37 C), temperature source Oral, resp. rate 18, weight 64.3 kg (141 lb 12.1 oz), SpO2 96 %.   Disposition: 01-Home or Self Care  Discharge Instructions    Call MD for:   Complete by:  As directed    Call MD for worsening of symptoms   Diet general   Complete by:  As directed    Renal and cardiac diet   Increase activity slowly   Complete by:  As directed      Allergies as of 12/16/2017   No Known Allergies     Medication List    STOP taking these medications   acetaminophen 500 MG tablet Commonly known as:  TYLENOL   b complex vitamins tablet   furosemide 80 MG tablet Commonly known as:  LASIX     TAKE these medications   aspirin 81 MG EC tablet Take 1 tablet (81 mg total) by mouth daily.   carvedilol 12.5 MG tablet Commonly  known as:  COREG Take 1 tablet (12.5 mg total) by mouth 2 (two) times daily with a meal.   Darbepoetin Alfa 200 MCG/0.4ML Sosy injection Commonly known as:  ARANESP Inject 0.4 mLs (200 mcg total) into the vein every 14 (fourteen) days.   doxercalciferol 4 MCG/2ML injection Commonly known as:  HECTOROL Inject 1 mL (2 mcg total) into the vein every Monday, Wednesday, and Friday with hemodialysis for 3 doses. Start taking on:  12/19/2017   feeding supplement (NEPRO CARB STEADY) Liqd Take 237 mLs by mouth 2 (two) times daily between meals.   hydrALAZINE 25 MG tablet Commonly known as:  APRESOLINE Take 1 tablet (25 mg total) by mouth every 8 (eight) hours.   pantoprazole 40 MG tablet Commonly known as:  PROTONIX Take 1 tablet (40 mg total) by mouth 2 (two) times daily before a meal.   senna-docusate 8.6-50 MG tablet Commonly known as:  Senokot-S Take 1 tablet by mouth 2 (two) times daily.   tamsulosin 0.4 MG Caps capsule Commonly known as:  FLOMAX Take 1 capsule (0.4 mg total) by mouth daily.            Durable Medical Equipment  (From admission, onward)        Start     Ordered   12/15/17 1615  For home use only DME Walker rolling  Once    Question:  Patient needs a walker to treat with the following condition  Answer:  Weakness   12/15/17 1614   12/15/17  1615  For home use only DME 3 n 1  Once     12/15/17 1614     Follow-up Information    Irine Seal, MD Follow up.   Specialty:  Urology Why:  1-2 weeks  Contact information: [place] [address] [phone]        Rosetta Posner, MD Follow up in 5 week(s).   Specialties:  Vascular Surgery, Cardiology Why:  Office will call the patient Contact information: [place] [address] [place] Follow up on 12/28/2017.   Specialty:  Internal Medicine Why:   Post hospital follow up scheduled for 12/28/2017 at 9:30 am with Cammie Sickle  NP Contact information: [place]       Irine Seal, MD Follow up.   Specialty:  Urology Why:  Please call the office to make a f/u appointment for 2-3 weeks after discharge.   Can see MD or Nurse Practioner.  Contact information: [place] [address] [place] Follow up.   Why:  Rolling walker and 3 in 1 /BSC will be delivered to bedside prior to discharge Contact information: [place] [address] [phone]        [facility] Follow up.   Specialty:  Home Health Services Contact information: [address] [place] [address] [phone]           Signed: Bonnell Public 12/16/2017, 1:47 PM

## 2017-12-28 ENCOUNTER — Ambulatory Visit: Payer: Self-pay | Admitting: Family Medicine

## 2018-01-24 ENCOUNTER — Encounter: Payer: Self-pay | Admitting: Vascular Surgery

## 2018-02-21 ENCOUNTER — Ambulatory Visit (INDEPENDENT_AMBULATORY_CARE_PROVIDER_SITE_OTHER): Payer: Self-pay | Admitting: Vascular Surgery

## 2018-02-21 ENCOUNTER — Encounter: Payer: Self-pay | Admitting: Vascular Surgery

## 2018-02-21 ENCOUNTER — Other Ambulatory Visit: Payer: Self-pay

## 2018-02-21 VITALS — BP 129/65 | Temp 98.6°F | Resp 20 | Ht 65.0 in | Wt 143.8 lb

## 2018-02-21 DIAGNOSIS — N186 End stage renal disease: Secondary | ICD-10-CM

## 2018-02-21 NOTE — Progress Notes (Signed)
   Patient name: Fernando Jones MRN: [id number] DOB: Jan 10, 1961 Sex: male  REASON FOR VISIT: Low up left radiocephalic AV fistula creation from 12/09/2017  HPI: Fernando Jones is a 57 y.o. male here today for follow-up.  He underwent right IJ tunneled catheter and left radiocephalic AV fistula creation by myself on 12/09/2017.  He reports that he has had no difficulty with his catheter.  He has had good flow and no evidence of infection.  He has had no difficulty with healing of his left radiocephalic fistula  Current Outpatient Medications  Medication Sig Dispense Refill  . aspirin EC 81 MG EC tablet Take 1 tablet (81 mg total) by mouth daily. 30 tablet 1  . carvedilol (COREG) 12.5 MG tablet Take 1 tablet (12.5 mg total) by mouth 2 (two) times daily with a meal. 60 tablet 1  . Darbepoetin Alfa (ARANESP) 200 MCG/0.4ML SOSY injection 247mcg to be given with dialysis every 14 days 1.68 mL 0  . hydrALAZINE (APRESOLINE) 25 MG tablet Take 1 tablet (25 mg total) by mouth every 8 (eight) hours. 90 tablet 0  . Nutritional Supplements (FEEDING SUPPLEMENT, NEPRO CARB STEADY,) LIQD Take 237 mLs by mouth 2 (two) times daily between meals. 60 Can 1  . pantoprazole (PROTONIX) 40 MG tablet Take 1 tablet (40 mg total) by mouth 2 (two) times daily before a meal. 60 tablet 0  . senna-docusate (SENOKOT-S) 8.6-50 MG tablet Take 1 tablet by mouth 2 (two) times daily. 60 tablet 0  . tamsulosin (FLOMAX) 0.4 MG CAPS capsule Take 1 capsule (0.4 mg total) by mouth daily. 30 capsule 0  . aspirin EC 81 MG EC tablet Take 1 tablet (81 mg total) by mouth daily. (Patient not taking: Reported on 12/06/2017)     No current facility-administered medications for this visit.      PHYSICAL EXAM: Vitals:   02/21/18 1221  BP: 129/65  Resp: 20  Temp: 98.6 F (37 C)  TempSrc: Oral  SpO2: 100%  Weight: 143 lb 12.8 oz (65.2 kg)  Height: 5\' 5"  (1.651 m)    GENERAL: The patient is a  well-nourished male, in no acute distress. The vital signs are documented above. Catheter with no erythema and no drainage. Well-healed radiocephalic incision with excellent thrill throughout a very nicely developed fistula.   MEDICAL ISSUES: Excellent Huck Ashworth maturation of his fistula.  Would recommend waiting for a total of 3 months which would put him in 1 April for access of his fistula.  I feel he has an extremely high chance that this will be successful for him.  We are available to remove catheter as needed following this.   Rosetta Posner, MD FACS [facility] Tel [phone] Pager [phone]

## 2018-04-27 ENCOUNTER — Other Ambulatory Visit (HOSPITAL_COMMUNITY): Payer: Self-pay | Admitting: Nephrology

## 2018-04-27 DIAGNOSIS — N186 End stage renal disease: Secondary | ICD-10-CM

## 2018-05-05 ENCOUNTER — Other Ambulatory Visit (HOSPITAL_COMMUNITY): Payer: Self-pay | Admitting: Nephrology

## 2018-05-05 ENCOUNTER — Ambulatory Visit (HOSPITAL_COMMUNITY)
Admission: RE | Admit: 2018-05-05 | Discharge: 2018-05-05 | Disposition: A | Discharge status: Home / Self Care | Payer: Self-pay | Source: Ambulatory Visit | Attending: Nephrology | Admitting: Nephrology

## 2018-05-05 ENCOUNTER — Encounter (HOSPITAL_COMMUNITY): Payer: Self-pay | Admitting: Interventional Radiology

## 2018-05-05 DIAGNOSIS — N186 End stage renal disease: Secondary | ICD-10-CM

## 2018-05-05 DIAGNOSIS — T82858A Stenosis of vascular prosthetic devices, implants and grafts, initial encounter: Secondary | ICD-10-CM | POA: Insufficient documentation

## 2018-05-05 DIAGNOSIS — Y832 Surgical operation with anastomosis, bypass or graft as the cause of abnormal reaction of the patient, or of later complication, without mention of misadventure at the time of the procedure: Secondary | ICD-10-CM | POA: Insufficient documentation

## 2018-05-05 HISTORY — PX: IR AV DIALY SHUNT INTRO NEEDLE/INTRACATH INITIAL W/PTA/IMG RIGHT: IMG6116

## 2018-05-05 HISTORY — PX: IR US GUIDE VASC ACCESS RIGHT: IMG2390

## 2018-05-05 MED ORDER — IOPAMIDOL (ISOVUE-300) INJECTION 61%
INTRAVENOUS | Status: AC
  Administered 2018-05-05: 25 mL
  Filled 2018-05-05: qty 50

## 2018-05-05 MED ORDER — LIDOCAINE HCL 1 % IJ SOLN
INTRAMUSCULAR | Status: AC
  Filled 2018-05-05: qty 20

## 2018-05-05 MED ORDER — LIDOCAINE HCL 1 % IJ SOLN
INTRAMUSCULAR | Status: DC | PRN
  Administered 2018-05-05: 8 mL

## 2018-05-05 MED ORDER — IOPAMIDOL (ISOVUE-300) INJECTION 61%
INTRAVENOUS | Status: AC
  Administered 2018-05-05: 75 mL
  Filled 2018-05-05: qty 100

## 2018-06-01 ENCOUNTER — Other Ambulatory Visit (HOSPITAL_COMMUNITY): Payer: Self-pay | Admitting: Nephrology

## 2018-06-01 DIAGNOSIS — N186 End stage renal disease: Secondary | ICD-10-CM

## 2018-06-05 ENCOUNTER — Ambulatory Visit (HOSPITAL_COMMUNITY)
Admission: RE | Admit: 2018-06-05 | Discharge: 2018-06-05 | Disposition: A | Discharge status: Home / Self Care | Payer: Self-pay | Source: Ambulatory Visit | Attending: Nephrology | Admitting: Nephrology

## 2018-06-05 ENCOUNTER — Encounter (HOSPITAL_COMMUNITY): Payer: Self-pay | Admitting: Interventional Radiology

## 2018-06-05 DIAGNOSIS — Z4901 Encounter for fitting and adjustment of extracorporeal dialysis catheter: Secondary | ICD-10-CM | POA: Insufficient documentation

## 2018-06-05 DIAGNOSIS — N186 End stage renal disease: Secondary | ICD-10-CM

## 2018-06-05 HISTORY — PX: IR REMOVAL TUN CV CATH W/O FL: IMG2289

## 2018-06-05 MED ORDER — LIDOCAINE HCL 1 % IJ SOLN
INTRAMUSCULAR | Status: AC
  Filled 2018-06-05: qty 20

## 2018-06-05 MED ORDER — CHLORHEXIDINE GLUCONATE 4 % EX LIQD
CUTANEOUS | Status: AC
  Filled 2018-06-05: qty 15

## 2018-06-05 NOTE — Procedures (Signed)
Successful removal of tunneled HD catheter EBL: None No immediate complications.  Ronny Bacon, MD Pager #: [phone]

## 2018-06-29 ENCOUNTER — Other Ambulatory Visit: Payer: Self-pay | Admitting: *Deleted

## 2018-06-29 ENCOUNTER — Inpatient Hospital Stay (HOSPITAL_COMMUNITY)
Admission: EM | Admit: 2018-06-29 | Discharge: 2018-07-02 | DRG: 981 | Disposition: A | Discharge status: Home / Self Care | Payer: Self-pay | Source: Ambulatory Visit | Attending: Internal Medicine | Admitting: Internal Medicine

## 2018-06-29 ENCOUNTER — Encounter (HOSPITAL_COMMUNITY): Payer: Self-pay

## 2018-06-29 ENCOUNTER — Other Ambulatory Visit: Payer: Self-pay

## 2018-06-29 DIAGNOSIS — Z87891 Personal history of nicotine dependence: Secondary | ICD-10-CM

## 2018-06-29 DIAGNOSIS — I120 Hypertensive chronic kidney disease with stage 5 chronic kidney disease or end stage renal disease: Secondary | ICD-10-CM | POA: Diagnosis present

## 2018-06-29 DIAGNOSIS — M898X9 Other specified disorders of bone, unspecified site: Secondary | ICD-10-CM | POA: Diagnosis present

## 2018-06-29 DIAGNOSIS — Z22322 Carrier or suspected carrier of Methicillin resistant Staphylococcus aureus: Secondary | ICD-10-CM

## 2018-06-29 DIAGNOSIS — E785 Hyperlipidemia, unspecified: Secondary | ICD-10-CM | POA: Diagnosis present

## 2018-06-29 DIAGNOSIS — E1129 Type 2 diabetes mellitus with other diabetic kidney complication: Secondary | ICD-10-CM | POA: Diagnosis present

## 2018-06-29 DIAGNOSIS — Z8249 Family history of ischemic heart disease and other diseases of the circulatory system: Secondary | ICD-10-CM

## 2018-06-29 DIAGNOSIS — T82868A Thrombosis of vascular prosthetic devices, implants and grafts, initial encounter: Secondary | ICD-10-CM | POA: Diagnosis present

## 2018-06-29 DIAGNOSIS — Y832 Surgical operation with anastomosis, bypass or graft as the cause of abnormal reaction of the patient, or of later complication, without mention of misadventure at the time of the procedure: Secondary | ICD-10-CM | POA: Diagnosis present

## 2018-06-29 DIAGNOSIS — D631 Anemia in chronic kidney disease: Secondary | ICD-10-CM | POA: Diagnosis present

## 2018-06-29 DIAGNOSIS — N186 End stage renal disease: Secondary | ICD-10-CM | POA: Diagnosis present

## 2018-06-29 DIAGNOSIS — E1122 Type 2 diabetes mellitus with diabetic chronic kidney disease: Secondary | ICD-10-CM | POA: Diagnosis present

## 2018-06-29 DIAGNOSIS — J449 Chronic obstructive pulmonary disease, unspecified: Secondary | ICD-10-CM | POA: Diagnosis present

## 2018-06-29 DIAGNOSIS — I10 Essential (primary) hypertension: Secondary | ICD-10-CM | POA: Diagnosis present

## 2018-06-29 DIAGNOSIS — Z419 Encounter for procedure for purposes other than remedying health state, unspecified: Secondary | ICD-10-CM

## 2018-06-29 DIAGNOSIS — Z992 Dependence on renal dialysis: Secondary | ICD-10-CM

## 2018-06-29 DIAGNOSIS — N2581 Secondary hyperparathyroidism of renal origin: Secondary | ICD-10-CM | POA: Diagnosis present

## 2018-06-29 DIAGNOSIS — F17200 Nicotine dependence, unspecified, uncomplicated: Secondary | ICD-10-CM | POA: Diagnosis present

## 2018-06-29 DIAGNOSIS — Z7982 Long term (current) use of aspirin: Secondary | ICD-10-CM

## 2018-06-29 DIAGNOSIS — E875 Hyperkalemia: Principal | ICD-10-CM | POA: Diagnosis present

## 2018-06-29 LAB — CBC WITH DIFFERENTIAL/PLATELET
Abs Immature Granulocytes: 0.1 10*3/uL (ref 0.0–0.1)
Basophils Absolute: 0.1 10*3/uL (ref 0.0–0.1)
Basophils Relative: 1 %
Eosinophils Absolute: 0.5 10*3/uL (ref 0.0–0.7)
Eosinophils Relative: 5 %
HCT: 38.5 % — ABNORMAL LOW (ref 39.0–52.0)
Hemoglobin: 12 g/dL — ABNORMAL LOW (ref 13.0–17.0)
Immature Granulocytes: 1 %
Lymphocytes Relative: 19 %
Lymphs Abs: 1.9 10*3/uL (ref 0.7–4.0)
MCH: 30.2 pg (ref 26.0–34.0)
MCHC: 31.2 g/dL (ref 30.0–36.0)
MCV: 97 fL (ref 78.0–100.0)
Monocytes Absolute: 0.6 10*3/uL (ref 0.1–1.0)
Monocytes Relative: 6 %
Neutro Abs: 6.8 10*3/uL (ref 1.7–7.7)
Neutrophils Relative %: 68 %
Platelets: 279 10*3/uL (ref 150–400)
RBC: 3.97 MIL/uL — ABNORMAL LOW (ref 4.22–5.81)
RDW: 14.6 % (ref 11.5–15.5)
WBC: 10 10*3/uL (ref 4.0–10.5)

## 2018-06-29 LAB — COMPREHENSIVE METABOLIC PANEL
ALT: 23 U/L (ref 0–44)
AST: 16 U/L (ref 15–41)
Albumin: 3.6 g/dL (ref 3.5–5.0)
Alkaline Phosphatase: 67 U/L (ref 38–126)
Anion gap: 17 — ABNORMAL HIGH (ref 5–15)
BUN: 111 mg/dL — ABNORMAL HIGH (ref 6–20)
CO2: 22 mmol/L (ref 22–32)
Calcium: 9.5 mg/dL (ref 8.9–10.3)
Chloride: 98 mmol/L (ref 98–111)
Creatinine, Ser: 14.22 mg/dL — ABNORMAL HIGH (ref 0.61–1.24)
GFR calc Af Amer: 4 mL/min — ABNORMAL LOW (ref >60)
GFR calc non Af Amer: 3 mL/min — ABNORMAL LOW (ref >60)
Glucose, Bld: 122 mg/dL — ABNORMAL HIGH (ref 70–99)
Potassium: 6.3 mmol/L (ref 3.5–5.1)
Sodium: 137 mmol/L (ref 135–145)
Total Bilirubin: 0.7 mg/dL (ref 0.3–1.2)
Total Protein: 7.2 g/dL (ref 6.5–8.1)

## 2018-06-29 LAB — GLUCOSE, CAPILLARY: GLUCOSE-CAPILLARY: 203 mg/dL — AB (ref 70–99)

## 2018-06-29 LAB — CBC
HCT: 35.7 % — ABNORMAL LOW (ref 39.0–52.0)
Hemoglobin: 11.7 g/dL — ABNORMAL LOW (ref 13.0–17.0)
MCH: 31.3 pg (ref 26.0–34.0)
MCHC: 32.8 g/dL (ref 30.0–36.0)
MCV: 95.5 fL (ref 78.0–100.0)
Platelets: 268 10*3/uL (ref 150–400)
RBC: 3.74 MIL/uL — AB (ref 4.22–5.81)
RDW: 14.6 % (ref 11.5–15.5)
WBC: 11.5 10*3/uL — AB (ref 4.0–10.5)

## 2018-06-29 LAB — CREATININE, SERUM
CREATININE: 15.25 mg/dL — AB (ref 0.61–1.24)
GFR, EST AFRICAN AMERICAN: 4 mL/min — AB (ref >60)
GFR, EST NON AFRICAN AMERICAN: 3 mL/min — AB (ref >60)

## 2018-06-29 MED ORDER — SODIUM POLYSTYRENE SULFONATE 15 GM/60ML PO SUSP
30.0000 g | Freq: Once | ORAL | Status: AC
  Administered 2018-06-29: 30 g via ORAL
  Filled 2018-06-29 (×2): qty 120

## 2018-06-29 MED ORDER — CHLORHEXIDINE GLUCONATE 4 % EX LIQD
60.0000 mL | Freq: Once | CUTANEOUS | Status: AC
  Administered 2018-06-30: 4 via TOPICAL
  Filled 2018-06-29: qty 60

## 2018-06-29 MED ORDER — SORBITOL 70 % SOLN: 30.0000 mL | Status: DC | PRN

## 2018-06-29 MED ORDER — SODIUM CHLORIDE 0.9 % IV SOLN
250.0000 mL | INTRAVENOUS | Status: DC | PRN
Start: 2018-06-29 — End: 2018-07-02
  Administered 2018-06-30 (×2): via INTRAVENOUS

## 2018-06-29 MED ORDER — CARVEDILOL 12.5 MG PO TABS
12.5000 mg | ORAL_TABLET | Freq: Two times a day (BID) | ORAL | Status: DC
  Administered 2018-06-30 – 2018-07-02 (×4): 12.5 mg via ORAL
  Filled 2018-06-29 (×4): qty 1

## 2018-06-29 MED ORDER — NEPRO/CARBSTEADY PO LIQD
237.0000 mL | Freq: Three times a day (TID) | ORAL | Status: DC | PRN
  Filled 2018-06-29: qty 237

## 2018-06-29 MED ORDER — SODIUM BICARBONATE 8.4 % IV SOLN: 50.0000 meq | Freq: Once | INTRAVENOUS | Status: DC

## 2018-06-29 MED ORDER — DOCUSATE SODIUM 283 MG RE ENEM
1.0000 | ENEMA | RECTAL | Status: DC | PRN
  Filled 2018-06-29: qty 1

## 2018-06-29 MED ORDER — CHLORHEXIDINE GLUCONATE 4 % EX LIQD
60.0000 mL | Freq: Once | CUTANEOUS | Status: AC
  Administered 2018-06-29: 4 via TOPICAL
  Filled 2018-06-29: qty 60

## 2018-06-29 MED ORDER — INSULIN ASPART 100 UNIT/ML IV SOLN
10.0000 [IU] | Freq: Once | INTRAVENOUS | Status: DC
  Filled 2018-06-29: qty 0.1

## 2018-06-29 MED ORDER — HEPARIN SODIUM (PORCINE) 5000 UNIT/ML IJ SOLN
5000.0000 [IU] | Freq: Three times a day (TID) | INTRAMUSCULAR | Status: DC
  Administered 2018-07-01 (×3): 5000 [IU] via SUBCUTANEOUS
  Filled 2018-06-29 (×2): qty 1

## 2018-06-29 MED ORDER — DEXTROSE 50 % IV SOLN: 1.0000 | Freq: Once | INTRAVENOUS | Status: DC

## 2018-06-29 MED ORDER — SODIUM CHLORIDE 0.9% FLUSH
3.0000 mL | Freq: Two times a day (BID) | INTRAVENOUS | Status: DC
  Administered 2018-06-29 – 2018-07-02 (×5): 3 mL via INTRAVENOUS

## 2018-06-29 MED ORDER — FAMOTIDINE 20 MG PO TABS
20.0000 mg | ORAL_TABLET | Freq: Two times a day (BID) | ORAL | Status: DC
  Administered 2018-06-29 – 2018-07-02 (×4): 20 mg via ORAL
  Filled 2018-06-29 (×4): qty 1

## 2018-06-29 MED ORDER — ONDANSETRON HCL 4 MG/2ML IJ SOLN
4.0000 mg | Freq: Four times a day (QID) | INTRAMUSCULAR | Status: DC | PRN
  Administered 2018-07-01: 4 mg via INTRAVENOUS
  Filled 2018-06-29: qty 2

## 2018-06-29 MED ORDER — TAMSULOSIN HCL 0.4 MG PO CAPS
0.4000 mg | ORAL_CAPSULE | Freq: Every day | ORAL | Status: DC
  Administered 2018-06-30 – 2018-07-02 (×2): 0.4 mg via ORAL
  Filled 2018-06-29 (×3): qty 1

## 2018-06-29 MED ORDER — LOSARTAN POTASSIUM 50 MG PO TABS
50.0000 mg | ORAL_TABLET | Freq: Every day | ORAL | Status: DC
  Administered 2018-06-30 – 2018-07-02 (×2): 50 mg via ORAL
  Filled 2018-06-29 (×3): qty 1

## 2018-06-29 MED ORDER — CAMPHOR-MENTHOL 0.5-0.5 % EX LOTN
1.0000 "application " | TOPICAL_LOTION | Freq: Three times a day (TID) | CUTANEOUS | Status: DC | PRN
  Administered 2018-06-30: 1 via TOPICAL
  Filled 2018-06-29: qty 222

## 2018-06-29 MED ORDER — INSULIN ASPART 100 UNIT/ML ~~LOC~~ SOLN
0.0000 [IU] | Freq: Three times a day (TID) | SUBCUTANEOUS | Status: DC
  Administered 2018-06-30: 2 [IU] via SUBCUTANEOUS
  Administered 2018-07-01: 1 [IU] via SUBCUTANEOUS
  Administered 2018-07-02: 9 [IU] via SUBCUTANEOUS

## 2018-06-29 MED ORDER — ACETAMINOPHEN 650 MG RE SUPP: 650.0000 mg | Freq: Four times a day (QID) | RECTAL | Status: DC | PRN

## 2018-06-29 MED ORDER — ACETAMINOPHEN 325 MG PO TABS
650.0000 mg | ORAL_TABLET | Freq: Four times a day (QID) | ORAL | Status: DC | PRN
  Filled 2018-06-29: qty 2

## 2018-06-29 MED ORDER — SODIUM CHLORIDE 0.9% FLUSH: 3.0000 mL | INTRAVENOUS | Status: DC | PRN

## 2018-06-29 MED ORDER — ALBUTEROL SULFATE (2.5 MG/3ML) 0.083% IN NEBU
10.0000 mg | INHALATION_SOLUTION | Freq: Once | RESPIRATORY_TRACT | Status: AC
  Administered 2018-06-29: 10 mg via RESPIRATORY_TRACT
  Filled 2018-06-29: qty 12

## 2018-06-29 MED ORDER — ONDANSETRON HCL 4 MG PO TABS: 4.0000 mg | ORAL_TABLET | Freq: Four times a day (QID) | ORAL | Status: DC | PRN

## 2018-06-29 MED ORDER — CALCIUM CARBONATE ANTACID 1250 MG/5ML PO SUSP
500.0000 mg | Freq: Four times a day (QID) | ORAL | Status: DC | PRN
  Filled 2018-06-29: qty 5

## 2018-06-29 MED ORDER — ZOLPIDEM TARTRATE 5 MG PO TABS
5.0000 mg | ORAL_TABLET | Freq: Every evening | ORAL | Status: DC | PRN
  Administered 2018-07-01 (×2): 5 mg via ORAL
  Filled 2018-06-29 (×2): qty 1

## 2018-06-29 MED ORDER — HYDROMORPHONE HCL 1 MG/ML IJ SOLN: 1.0000 mg | Freq: Once | INTRAMUSCULAR | Status: DC

## 2018-06-29 MED ORDER — HYDROXYZINE HCL 25 MG PO TABS: 25.0000 mg | ORAL_TABLET | Freq: Three times a day (TID) | ORAL | Status: DC | PRN

## 2018-06-29 MED ORDER — SODIUM CHLORIDE 0.9 % IV SOLN
INTRAVENOUS | Status: DC
  Administered 2018-06-30: 06:00:00 via INTRAVENOUS

## 2018-06-29 MED ORDER — CEFAZOLIN SODIUM-DEXTROSE 2-4 GM/100ML-% IV SOLN
2.0000 g | INTRAVENOUS | Status: AC
  Administered 2018-06-30: 2 g via INTRAVENOUS
  Filled 2018-06-29 (×2): qty 100

## 2018-06-29 NOTE — ED Provider Notes (Signed)
[facility] EMERGENCY DEPARTMENT Provider Note   CSN: [id number] Arrival date & time: 06/29/18  1701     History   Chief Complaint Chief Complaint  Patient presents with  . Vascular Access Problem    HPI Fernando Jones is a 57 y.o. male.  HPI   Fernando Jones is a 57yo male with a history of ESRD, hyperlipidemia, hypertension, type 2 diabetes, tobacco use who presents to the emergency department from his dialysis center for evaluation of graft problem and right arm.  Patient is on hemodialysis schedule Tu/Thurs/Sat.  He last had dialysis 2 days ago.  He states that today he had pain in his right arm over where the graft is located.  Reports that they were unable to access the graft at the dialysis center and was sent here for further evaluation.  Patient reports that his pain is moderate and constant.  He has not taken any over-the-counter medications for his symptoms.  He denies shortness of breath, edema, chest pain, abdominal pain or distention, numbness, weakness, fever, chills, rash or wound.   Past Medical History:  Diagnosis Date  . Erectile dysfunction 2012  . ESRD (end stage renal disease) ([place])    w/Left ureteral stone/hydronephrosis/notes 12/06/2017  . Hepatitis 1973   "? kind"  . Hyperlipidemia 2012  . Hypertension   . Tobacco abuse 2012   1/2 pack per day  . Type II diabetes mellitus [facility])     Patient Active Problem List   Diagnosis Date Noted  . Malnutrition of moderate degree 12/15/2017  . ESRD (end stage renal disease) ([place]) 12/06/2017  . Severe Vitamin D deficiency 10/14/2016  . Diastolic dysfunction with acute on chronic heart failure ([place]) 10/13/2016  . Acute respiratory failure with hypoxia ([place]) 10/12/2016  . Anasarca 10/12/2016  . Acute renal failure (ARF) ([place]) 10/12/2016  . Hypertensive urgency 10/12/2016  . Acute respiratory failure ([place]) 10/12/2016  . ERECTILE DYSFUNCTION, NON-ORGANIC 06/23/2009  . DM (diabetes  mellitus), type 2 with renal complications ([place]) 79/89/2119  . HYPERLIPIDEMIA 05/20/2009  . TOBACCO ABUSE 05/20/2009  . Essential hypertension 05/20/2009  . CONSTIPATION 05/20/2009    Past Surgical History:  Procedure Laterality Date  . APPENDECTOMY    . AV FISTULA PLACEMENT Right 12/09/2017   Procedure: ARTERIOVENOUS (AV) FISTULA CREATION;  Surgeon: Rosetta Posner, MD;  Location: [facility];  Service: Vascular;  Laterality: Right;  . CARDIAC CATHETERIZATION  05/12/2009   Archie Endo 04/07/2011  . CYSTOSCOPY/URETEROSCOPY/HOLMIUM LASER/STENT PLACEMENT Left 12/08/2017   Procedure: CYSTOSCOPY/RETROGRADE PYLEOGRAM LEFT URETEROSCOPY AND STONE EXTRACTION;  Surgeon: Festus Aloe, MD;  Location: [facility];  Service: Urology;  Laterality: Left;  . HOLMIUM LASER APPLICATION Left 03/07/7407   Procedure: HOLMIUM LASER APPLICATION;  Surgeon: Festus Aloe, MD;  Location: [facility];  Service: Urology;  Laterality: Left;  . INSERTION OF DIALYSIS CATHETER Right 12/09/2017   Procedure: EXCHANGE  OF TUNNELED  DIALYSIS CATHETER RIGHT INTERNAL JUGULAR.;  Surgeon: Rosetta Posner, MD;  Location: [facility];  Service: Vascular;  Laterality: Right;  . IR AV DIALY SHUNT INTRO NEEDLE/INTRACATH INITIAL W/PTA/IMG RIGHT Right 05/05/2018  . IR FLUORO GUIDE CV LINE RIGHT  12/07/2017  . IR REMOVAL TUN CV CATH W/O FL  06/05/2018  . IR US GUIDE VASC ACCESS RIGHT  12/07/2017  . IR US GUIDE VASC ACCESS RIGHT  05/05/2018        Home Medications    Prior to Admission medications   Medication Sig Start Date End Date Taking? Authorizing Provider  aspirin EC 81 MG EC tablet Take 1 tablet (81 mg total) by mouth daily. Patient not taking: Reported on 12/06/2017 10/17/16   Murlean Iba, MD  aspirin EC 81 MG EC tablet Take 1 tablet (81 mg total) by mouth daily. 12/17/17   Bonnell Public, MD  carvedilol (COREG) 12.5 MG tablet Take 1 tablet (12.5 mg total) by mouth 2 (two) times daily with a meal. 12/16/17   Bonnell Public, MD  Darbepoetin  Alfa (ARANESP) 200 MCG/0.4ML SOSY injection 260mcg to be given with dialysis every 14 days 12/16/17   Dana Allan I, MD  hydrALAZINE (APRESOLINE) 25 MG tablet Take 1 tablet (25 mg total) by mouth every 8 (eight) hours. 12/16/17   Bonnell Public, MD  Nutritional Supplements (FEEDING SUPPLEMENT, NEPRO CARB STEADY,) LIQD Take 237 mLs by mouth 2 (two) times daily between meals. 12/16/17   Bonnell Public, MD  pantoprazole (PROTONIX) 40 MG tablet Take 1 tablet (40 mg total) by mouth 2 (two) times daily before a meal. 12/16/17   Bonnell Public, MD  senna-docusate (SENOKOT-S) 8.6-50 MG tablet Take 1 tablet by mouth 2 (two) times daily. 12/16/17   Dana Allan I, MD  tamsulosin (FLOMAX) 0.4 MG CAPS capsule Take 1 capsule (0.4 mg total) by mouth daily. 12/16/17   Bonnell Public, MD    Family History Family History  Problem Relation Age of Onset  . Congestive Heart Failure Mother   . Diabetes Neg Hx     Social History Social History   Tobacco Use  . Smoking status: Former Smoker    Packs/day: 1.00    Types: Cigarettes    Last attempt to quit: 12/06/2016    Years since quitting: 1.5  . Smokeless tobacco: Never Used  Substance Use Topics  . Alcohol use: No  . Drug use: No     Allergies   Patient has no known allergies.   Review of Systems Review of Systems  Constitutional: Negative for chills and fever.  Eyes: Negative for visual disturbance.  Respiratory: Negative for cough and shortness of breath.   Cardiovascular: Negative for chest pain.  Gastrointestinal: Negative for abdominal distention, abdominal pain, nausea and vomiting.  Genitourinary: Positive for difficulty urinating (anuric).  Musculoskeletal: Positive for myalgias (pain over right av fistula. ).  Skin: Negative for color change and wound.  Neurological: Negative for weakness and numbness.  Psychiatric/Behavioral: Negative for agitation.     Physical Exam Updated Vital Signs BP (!) 159/83    Pulse 88   Temp 98.2 F (36.8 C) (Oral)   Resp 18   Ht 5\' 5"  (1.651 m)   Wt 74.4 kg (164 lb 0.4 oz)   SpO2 100%   BMI 27.29 kg/m   Physical Exam  Constitutional: He is oriented to person, place, and time. He appears well-developed and well-nourished. No distress.  HENT:  Head: Normocephalic and atraumatic.  Mouth/Throat: Oropharynx is clear and moist.  Eyes: Pupils are equal, round, and reactive to light. Conjunctivae are normal. Right eye exhibits no discharge. Left eye exhibits no discharge.  Neck: Normal range of motion.  Cardiovascular: Normal rate and regular rhythm.  Pulmonary/Chest: Effort normal and breath sounds normal. No stridor. No respiratory distress. He has no wheezes. He has no rales.  Abdominal: Soft. Bowel sounds are normal. He exhibits no distension. There is no tenderness.  Musculoskeletal: Normal range of motion.  Radial aspect of right wrist with well healed incision. AV fistula tender to palpation, no palpable thrill.  No induration, erythema or warmth. Radial pulse 2+ bilaterally.   Neurological: He is alert and oriented to person, place, and time. Coordination normal.  Skin: Skin is warm and dry. He is not diaphoretic.  Psychiatric: He has a normal mood and affect. His behavior is normal.  Nursing note and vitals reviewed.    ED Treatments / Results  Labs (all labs ordered are listed, but only abnormal results are displayed) Labs Reviewed  COMPREHENSIVE METABOLIC PANEL - Abnormal; Notable for the following components:      Result Value   Potassium 6.3 (*)    Glucose, Bld 122 (*)    BUN 111 (*)    Creatinine, Ser 14.22 (*)    GFR calc non Af Amer 3 (*)    GFR calc Af Amer 4 (*)    Anion gap 17 (*)    All other components within normal limits  CBC WITH DIFFERENTIAL/PLATELET - Abnormal; Notable for the following components:   RBC 3.97 (*)    Hemoglobin 12.0 (*)    HCT 38.5 (*)    All other components within normal limits  GLUCOSE, CAPILLARY -  Abnormal; Notable for the following components:   Glucose-Capillary 203 (*)    All other components within normal limits  CBC - Abnormal; Notable for the following components:   WBC 11.5 (*)    RBC 3.74 (*)    Hemoglobin 11.7 (*)    HCT 35.7 (*)    All other components within normal limits  CREATININE, SERUM - Abnormal; Notable for the following components:   Creatinine, Ser 15.25 (*)    GFR calc non Af Amer 3 (*)    GFR calc Af Amer 4 (*)    All other components within normal limits  SURGICAL PCR SCREEN  RENAL FUNCTION PANEL  CBC    EKG None  Radiology No results found.  Procedures Procedures (including critical care time)  Medications Ordered in ED Medications  dextrose 50 % solution 50 mL (has no administration in time range)  insulin aspart (novoLOG) injection 10 Units (has no administration in time range)  albuterol (PROVENTIL) (2.5 MG/3ML) 0.083% nebulizer solution 10 mg (has no administration in time range)  sodium bicarbonate injection 50 mEq (has no administration in time range)  HYDROmorphone (DILAUDID) injection 1 mg (has no administration in time range)  sodium polystyrene (KAYEXALATE) 15 GM/60ML suspension 30 g (has no administration in time range)     Initial Impression / Assessment and Plan / ED Course  I have reviewed the triage vital signs and the nursing notes.  Pertinent labs & imaging results that were available during my care of the patient were reviewed by me and considered in my medical decision making (see chart for details).     ESRD patient presents to the emergency department for evaluation of unable to access right AV fistula during dialysis today.  He also reports pain over the fistula site.  He is neurovascularly intact on exam.  No evidence of infectious process.  He is hyperkalemic with potassium 6.3.  Discussed this patient with Nephrologist Dr. Johnney Ou who would like patient admitted to medicine and started on Kayexalate.  Fistula  thrombectomy procedure planned for tomorrow and patient will receive dialysis after the procedure.  Patient informed of this plan.   Final Clinical Impressions(s) / ED Diagnoses   Final diagnoses:  Surgery, elective    ED Discharge Orders    None       Glyn Ade, PA-C 06/30/18 0130  Virgel Manifold, MD 07/02/18 0120

## 2018-06-29 NOTE — H&P (Signed)
History and Physical    Fernando Jones ERD:[id number] DOB: 08-14-1961 DOA: 06/29/2018  Referring MD/NP/PA: Dr Wilson Singer  PCP: Patient, No Pcp Per   Outpatient Specialists: [facility]  Patient coming from: Home  Chief Complaint: Weakness  HPI: Fernando Jones is a 57 y.o. male with medical history significant of end-stage renal disease, diabetes, hypertension, GERD as well as tobacco abuse who is on hemodialysis on Tuesdays Thursdays and Saturdays.  Patient had malfunctioning dialysis shunt.  He was able to get dialyzed on Tuesday.  Plan was to have surgical revision of his graft tomorrow however patient was found to be hyperkalemic with pain over the graft area.  No other complaints.  No fever or chills no nausea vomiting or diarrhea.  Pain is currently 6 out of 10.  Relieved by pain medication in the ER.  Patient has no significant EKG change.  Due to his hyperkalemia nephrology was consulted and patient admitted for treatment and to get hemodialyzed tomorrow.  ED Course: Vitals are stable with temperature 98 to blood pressure 135/67 pulse of 90 respiratory rate 18 sats 100% on room air.  White count is 10.0 hemoglobin 12.0 platelets 279.  Sodium 137 potassium 6.3 with creatinine of 14 and BUN 111.  CO2 is 22.  EKG showed no significant ST changes  Review of Systems: As per HPI otherwise 10 point review of systems negative.    Past Medical History:  Diagnosis Date  . Erectile dysfunction 2012  . ESRD (end stage renal disease) ([place])    w/Left ureteral stone/hydronephrosis/notes 12/06/2017  . Hepatitis 1973   "? kind"  . Hyperlipidemia 2012  . Hypertension   . Tobacco abuse 2012   1/2 pack per day  . Type II diabetes mellitus ([place])     Past Surgical History:  Procedure Laterality Date  . APPENDECTOMY    . AV FISTULA PLACEMENT Right 12/09/2017   Procedure: ARTERIOVENOUS (AV) FISTULA CREATION;  Surgeon: Rosetta Posner, MD;  Location: [facility];  Service: Vascular;   Laterality: Right;  . CARDIAC CATHETERIZATION  05/12/2009   Archie Endo 04/07/2011  . CYSTOSCOPY/URETEROSCOPY/HOLMIUM LASER/STENT PLACEMENT Left 12/08/2017   Procedure: CYSTOSCOPY/RETROGRADE PYLEOGRAM LEFT URETEROSCOPY AND STONE EXTRACTION;  Surgeon: Festus Aloe, MD;  [facility];  Service: Urology;  Laterality: Left;  . HOLMIUM LASER APPLICATION Left 04/10/3148   Procedure: HOLMIUM LASER APPLICATION;  Surgeon: Festus Aloe, MD;  [facility];  Service: Urology;  Laterality: Left;  . INSERTION OF DIALYSIS CATHETER Right 12/09/2017   Procedure: EXCHANGE  OF TUNNELED  DIALYSIS CATHETER RIGHT INTERNAL JUGULAR.;  Surgeon: Rosetta Posner, MD;  Location: [facility];  Service: Vascular;  Laterality: Right;  . IR AV DIALY SHUNT INTRO NEEDLE/INTRACATH INITIAL W/PTA/IMG RIGHT Right 05/05/2018  . IR FLUORO GUIDE CV LINE RIGHT  12/07/2017  . IR REMOVAL TUN CV CATH W/O FL  06/05/2018  . IR US GUIDE VASC ACCESS RIGHT  12/07/2017  . IR US GUIDE VASC ACCESS RIGHT  05/05/2018     reports that he quit smoking about 18 months ago. His smoking use included cigarettes. He smoked 1.00 pack per day. He has never used smokeless tobacco. He reports that he does not drink alcohol or use drugs.  No Known Allergies  Family History  Problem Relation Age of Onset  . Congestive Heart Failure Mother   . Diabetes Neg Hx     Prior to Admission medications   Medication Sig Start Date End Date Taking? Authorizing Provider  carvedilol (COREG)  12.5 MG tablet Take 1 tablet (12.5 mg total) by mouth 2 (two) times daily with a meal. 12/16/17  Yes Bonnell Public, MD  losartan (COZAAR) 50 MG tablet Take 50 mg by mouth daily. 05/24/18  Yes [provider]  ranitidine (ZANTAC) 150 MG tablet Take 150 mg by mouth daily. 05/24/18  Yes [provider]  tamsulosin (FLOMAX) 0.4 MG CAPS capsule Take 1 capsule (0.4 mg total) by mouth daily. 12/16/17  Yes Dana Allan I, MD  aspirin EC 81 MG EC tablet Take 1 tablet (81 mg  total) by mouth daily. Patient not taking: Reported on 12/06/2017 10/17/16   Murlean Iba, MD  aspirin EC 81 MG EC tablet Take 1 tablet (81 mg total) by mouth daily. Patient not taking: Reported on 06/29/2018 12/17/17   Dana Allan I, MD  Darbepoetin Alfa (ARANESP) 200 MCG/0.4ML SOSY injection 237mcg to be given with dialysis every 14 days 12/16/17   Dana Allan I, MD  hydrALAZINE (APRESOLINE) 25 MG tablet Take 1 tablet (25 mg total) by mouth every 8 (eight) hours. Patient not taking: Reported on 06/29/2018 12/16/17   Bonnell Public, MD  Nutritional Supplements (FEEDING SUPPLEMENT, NEPRO CARB STEADY,) LIQD Take 237 mLs by mouth 2 (two) times daily between meals. Patient not taking: Reported on 06/29/2018 12/16/17   Dana Allan I, MD  pantoprazole (PROTONIX) 40 MG tablet Take 1 tablet (40 mg total) by mouth 2 (two) times daily before a meal. Patient not taking: Reported on 06/29/2018 12/16/17   Dana Allan I, MD  senna-docusate (SENOKOT-S) 8.6-50 MG tablet Take 1 tablet by mouth 2 (two) times daily. Patient not taking: Reported on 06/29/2018 12/16/17   Dana Allan I, MD    Physical Exam: Vitals:   06/29/18 2015 06/29/18 2045 06/29/18 2055 06/29/18 2130  BP: (!) 159/83 (!) 178/71  135/67  Pulse: 88 88  90  Resp: 18 15  18   Temp:      TempSrc:      SpO2: 100% 100% 100% 100%  Weight:      Height:          Constitutional: NAD, calm, comfortable Vitals:   06/29/18 2015 06/29/18 2045 06/29/18 2055 06/29/18 2130  BP: (!) 159/83 (!) 178/71  135/67  Pulse: 88 88  90  Resp: 18 15  18   Temp:      TempSrc:      SpO2: 100% 100% 100% 100%  Weight:      Height:       Eyes: PERRL, lids and conjunctivae normal ENMT: Mucous membranes are moist. Posterior pharynx clear of any exudate or lesions.Normal dentition.  Neck: normal, supple, no masses, no thyromegaly Respiratory: clear to auscultation bilaterally, no wheezing, no crackles. Normal respiratory effort. No  accessory muscle use.  Cardiovascular: Regular rate and rhythm, no murmurs / rubs / gallops. No extremity edema. 2+ pedal pulses. No carotid bruits.  Abdomen: no tenderness, no masses palpated. No hepatosplenomegaly. Bowel sounds positive.  Musculoskeletal: no clubbing / cyanosis. No joint deformity upper and lower extremities. Good ROM, no contractures. Normal muscle tone.  Skin: no rashes, lesions, ulcers. No induration Neurologic: CN 2-12 grossly intact. Sensation intact, DTR normal. Strength 5/5 in all 4.  Psychiatric: Normal judgment and insight. Alert and oriented x 3. Normal mood.   Labs on Admission: I have personally reviewed following labs and imaging studies  CBC: Recent Labs  Lab 06/29/18 1729  WBC 10.0  NEUTROABS 6.8  HGB 12.0*  HCT 38.5*  MCV  97.0  PLT 062   Basic Metabolic Panel: Recent Labs  Lab 06/29/18 1729  NA 137  K 6.3*  CL 98  CO2 22  GLUCOSE 122*  BUN 111*  CREATININE 14.22*  CALCIUM 9.5   GFR: Estimated Creatinine Clearance: 5.4 mL/min (A) (by C-G formula based on SCr of 14.22 mg/dL (H)). Liver Function Tests: Recent Labs  Lab 06/29/18 1729  AST 16  ALT 23  ALKPHOS 67  BILITOT 0.7  PROT 7.2  ALBUMIN 3.6   No results for input(s): LIPASE, AMYLASE in the last 168 hours. No results for input(s): AMMONIA in the last 168 hours. Coagulation Profile: No results for input(s): INR, PROTIME in the last 168 hours. Cardiac Enzymes: No results for input(s): CKTOTAL, CKMB, CKMBINDEX, TROPONINI in the last 168 hours. BNP (last 3 results) No results for input(s): PROBNP in the last 8760 hours. HbA1C: No results for input(s): HGBA1C in the last 72 hours. CBG: No results for input(s): GLUCAP in the last 168 hours. Lipid Profile: No results for input(s): CHOL, HDL, LDLCALC, TRIG, CHOLHDL, LDLDIRECT in the last 72 hours. Thyroid Function Tests: No results for input(s): TSH, T4TOTAL, FREET4, T3FREE, THYROIDAB in the last 72 hours. Anemia Panel: No  results for input(s): VITAMINB12, FOLATE, FERRITIN, TIBC, IRON, RETICCTPCT in the last 72 hours. Urine analysis:    Component Value Date/Time   COLORURINE YELLOW 12/06/2017 1339   APPEARANCEUR CLEAR 12/06/2017 1339   LABSPEC 1.011 12/06/2017 1339   PHURINE 5.0 12/06/2017 1339   GLUCOSEU 50 (A) 12/06/2017 1339   HGBUR MODERATE (A) 12/06/2017 1339   BILIRUBINUR NEGATIVE 12/06/2017 1339   KETONESUR NEGATIVE 12/06/2017 1339   PROTEINUR >=300 (A) 12/06/2017 1339   NITRITE NEGATIVE 12/06/2017 1339   LEUKOCYTESUR NEGATIVE 12/06/2017 1339   Sepsis Labs: @LABRCNTIP (procalcitonin:4,lacticidven:4) )No results found for this or any previous visit (from the past 240 hour(s)).   Radiological Exams on Admission: No results found.  EKG: Independently reviewed.  Shows sinus rhythm with a rate of 84.  Normal interval.  Normal voltage with no T wave abnormalities  Assessment/Plan Principal Problem:   Hyperkalemia Active Problems:   DM (diabetes mellitus), type 2 with renal complications ([facility])   TOBACCO ABUSE   Essential hypertension   ESRD (end stage renal disease) ([place])    #1 hyperkalemia: Most likely secondary to end-stage renal disease.  Admit the patient and start on Kayexalate tonight.  Patient will be dialyzed tomorrow if his access is revised.  Placed on telemetry.  #2 end-stage renal disease: Patient's renal dialysis will have been today.  Again access will be reviewed tomorrow with hemodialysis after that.  #3 diabetes: Blood sugar appears controlled.  Add sliding scale insulin to home regimen.  #4 tobacco abuse: Counseling provided.  Offered nicotine patch.  Has slight COPD with empiric nebulizer.  #5 COPD: Mild COPD but no exacerbation.  #6 hypertension: Blood pressure is currently elevated most likely due to lack of hemodialysis.  Treat symptomatically.   DVT prophylaxis: Heparin  Code Status: Full code  Family Communication: No family available  Disposition Plan:  Home  Consults called: Nephrology Dr. Johnney Ou  Admission status: inpatient   Severity of Illness: The appropriate patient status for this patient is INPATIENT. Inpatient status is judged to be reasonable and necessary in order to provide the required intensity of service to ensure the patient's safety. The patient's presenting symptoms, physical exam findings, and initial radiographic and laboratory data in the context of their chronic comorbidities is felt to place them at  high risk for further clinical deterioration. Furthermore, it is not anticipated that the patient will be medically stable for discharge from the hospital within 2 midnights of admission. The following factors support the patient status of inpatient.   " The patient's presenting symptoms include Hyperkalemia. " The worrisome physical exam findings include Malfunctioning shunt. " The initial radiographic and laboratory data are worrisome because of Hyperkalemia. " The chronic co-morbidities include ESRD.   * I certify that at the point of admission it is my clinical judgment that the patient will require inpatient hospital care spanning beyond 2 midnights from the point of admission due to high intensity of service, high risk for further deterioration and high frequency of surveillance required.Barbette Merino MD [facility] Pager [phone]  If 7PM-7AM, please contact night-coverage [email or web address] Password [facility]  06/29/2018, 9:46 PM

## 2018-06-29 NOTE — ED Notes (Signed)
Pt's dialysis center called and reports pt's fistula is clotted, reporting pt's potassium is 6. Requesting a dialysis cath to be placed.

## 2018-06-29 NOTE — ED Triage Notes (Signed)
Pt endorses he went to dialysis and they were unable to do dialysis due to graft problem in right arm. Pt endorses pain to same area. Last full dialysis was Tuesday.

## 2018-06-30 ENCOUNTER — Encounter (HOSPITAL_COMMUNITY): Payer: Self-pay | Admitting: Anesthesiology

## 2018-06-30 ENCOUNTER — Ambulatory Visit (HOSPITAL_COMMUNITY): Admission: RE | Admit: 2018-06-30 | Payer: Self-pay | Source: Ambulatory Visit | Admitting: Vascular Surgery

## 2018-06-30 ENCOUNTER — Inpatient Hospital Stay (HOSPITAL_COMMUNITY): Payer: Self-pay

## 2018-06-30 ENCOUNTER — Inpatient Hospital Stay (HOSPITAL_COMMUNITY): Payer: Self-pay | Admitting: Anesthesiology

## 2018-06-30 ENCOUNTER — Encounter (HOSPITAL_COMMUNITY)
Admission: EM | Disposition: A | Discharge status: Home / Self Care | Payer: Self-pay | Source: Ambulatory Visit | Attending: Internal Medicine

## 2018-06-30 DIAGNOSIS — T82868A Thrombosis of vascular prosthetic devices, implants and grafts, initial encounter: Secondary | ICD-10-CM

## 2018-06-30 DIAGNOSIS — N186 End stage renal disease: Secondary | ICD-10-CM

## 2018-06-30 DIAGNOSIS — Z992 Dependence on renal dialysis: Secondary | ICD-10-CM

## 2018-06-30 HISTORY — PX: THROMBECTOMY W/ EMBOLECTOMY: SHX2507

## 2018-06-30 LAB — RENAL FUNCTION PANEL
ALBUMIN: 3.1 g/dL — AB (ref 3.5–5.0)
Anion gap: 17 — ABNORMAL HIGH (ref 5–15)
BUN: 121 mg/dL — ABNORMAL HIGH (ref 6–20)
CALCIUM: 8.7 mg/dL — AB (ref 8.9–10.3)
CO2: 24 mmol/L (ref 22–32)
CREATININE: 15.03 mg/dL — AB (ref 0.61–1.24)
Chloride: 101 mmol/L (ref 98–111)
GFR calc non Af Amer: 3 mL/min — ABNORMAL LOW (ref >60)
GFR, EST AFRICAN AMERICAN: 4 mL/min — AB (ref >60)
GLUCOSE: 91 mg/dL (ref 70–99)
PHOSPHORUS: 7.3 mg/dL — AB (ref 2.5–4.6)
Potassium: 4.8 mmol/L (ref 3.5–5.1)
SODIUM: 142 mmol/L (ref 135–145)

## 2018-06-30 LAB — CBC
HCT: 32.5 % — ABNORMAL LOW (ref 39.0–52.0)
HEMOGLOBIN: 10.4 g/dL — AB (ref 13.0–17.0)
MCH: 30.4 pg (ref 26.0–34.0)
MCHC: 32 g/dL (ref 30.0–36.0)
MCV: 95 fL (ref 78.0–100.0)
PLATELETS: 250 10*3/uL (ref 150–400)
RBC: 3.42 MIL/uL — AB (ref 4.22–5.81)
RDW: 14.4 % (ref 11.5–15.5)
WBC: 10.1 10*3/uL (ref 4.0–10.5)

## 2018-06-30 LAB — GLUCOSE, CAPILLARY
Glucose-Capillary: 98 mg/dL (ref 70–99)
Glucose-Capillary: 91 mg/dL (ref 70–99)
Glucose-Capillary: 116 mg/dL — ABNORMAL HIGH (ref 70–99)
Glucose-Capillary: 107 mg/dL — ABNORMAL HIGH (ref 70–99)

## 2018-06-30 LAB — SURGICAL PCR SCREEN
MRSA, PCR: POSITIVE — AB
Staphylococcus aureus: POSITIVE — AB

## 2018-06-30 SURGERY — THROMBECTOMY ARTERIOVENOUS FISTULA
Anesthesia: General | Site: Arm Lower | Laterality: Right

## 2018-06-30 MED ORDER — OXYCODONE HCL 5 MG/5ML PO SOLN: 5.0000 mg | Freq: Once | ORAL | Status: DC | PRN

## 2018-06-30 MED ORDER — HEPARIN SODIUM (PORCINE) 5000 UNIT/ML IJ SOLN
INTRAMUSCULAR | Status: DC | PRN
  Administered 2018-06-30: 500 mL

## 2018-06-30 MED ORDER — HEPARIN SODIUM (PORCINE) 1000 UNIT/ML DIALYSIS: 1000.0000 [IU] | INTRAMUSCULAR | Status: DC | PRN

## 2018-06-30 MED ORDER — HEPARIN SODIUM (PORCINE) 1000 UNIT/ML DIALYSIS
20.0000 [IU]/kg | INTRAMUSCULAR | Status: DC | PRN
  Administered 2018-06-30: 1500 [IU] via INTRAVENOUS_CENTRAL

## 2018-06-30 MED ORDER — SODIUM CHLORIDE 0.9 % IV SOLN: 100.0000 mL | INTRAVENOUS | Status: DC | PRN

## 2018-06-30 MED ORDER — FENTANYL CITRATE (PF) 100 MCG/2ML IJ SOLN
INTRAMUSCULAR | Status: DC | PRN
  Administered 2018-06-30 (×3): 50 ug via INTRAVENOUS

## 2018-06-30 MED ORDER — LIDOCAINE HCL (PF) 1 % IJ SOLN
INTRAMUSCULAR | Status: AC
  Filled 2018-06-30: qty 30

## 2018-06-30 MED ORDER — LIDOCAINE-EPINEPHRINE (PF) 1 %-1:200000 IJ SOLN
INTRAMUSCULAR | Status: AC
  Filled 2018-06-30: qty 30

## 2018-06-30 MED ORDER — NICOTINE 21 MG/24HR TD PT24
21.0000 mg | MEDICATED_PATCH | Freq: Every day | TRANSDERMAL | Status: DC
  Filled 2018-06-30 (×3): qty 1

## 2018-06-30 MED ORDER — ALTEPLASE 2 MG IJ SOLR: 2.0000 mg | Freq: Once | INTRAMUSCULAR | Status: DC | PRN

## 2018-06-30 MED ORDER — PAPAVERINE HCL 30 MG/ML IJ SOLN
INTRAMUSCULAR | Status: AC
  Filled 2018-06-30: qty 2

## 2018-06-30 MED ORDER — PROPOFOL 500 MG/50ML IV EMUL: INTRAVENOUS | Status: DC | PRN

## 2018-06-30 MED ORDER — HEPARIN SODIUM (PORCINE) 1000 UNIT/ML IJ SOLN
INTRAMUSCULAR | Status: DC | PRN
  Administered 2018-06-30: 6000 [IU] via INTRAVENOUS

## 2018-06-30 MED ORDER — SODIUM CHLORIDE 0.9 % IV SOLN
INTRAVENOUS | Status: DC
  Administered 2018-06-30: 11:00:00 via INTRAVENOUS

## 2018-06-30 MED ORDER — PHENYLEPHRINE HCL 10 MG/ML IJ SOLN
INTRAMUSCULAR | Status: DC | PRN
  Administered 2018-06-30: 160 ug via INTRAVENOUS
  Administered 2018-06-30 (×2): 120 ug via INTRAVENOUS

## 2018-06-30 MED ORDER — HYDROCODONE-ACETAMINOPHEN 5-325 MG PO TABS
1.0000 | ORAL_TABLET | ORAL | Status: DC | PRN
  Administered 2018-06-30 – 2018-07-02 (×4): 1 via ORAL
  Filled 2018-06-30 (×4): qty 1

## 2018-06-30 MED ORDER — FENTANYL CITRATE (PF) 250 MCG/5ML IJ SOLN
INTRAMUSCULAR | Status: AC
  Filled 2018-06-30: qty 5

## 2018-06-30 MED ORDER — PROPOFOL 10 MG/ML IV BOLUS
INTRAVENOUS | Status: AC
  Filled 2018-06-30: qty 40

## 2018-06-30 MED ORDER — FERRIC CITRATE 1 GM 210 MG(FE) PO TABS
210.0000 mg | ORAL_TABLET | Freq: Three times a day (TID) | ORAL | Status: DC
  Administered 2018-06-30 – 2018-07-02 (×4): 210 mg via ORAL
  Filled 2018-06-30 (×7): qty 1

## 2018-06-30 MED ORDER — DOXERCALCIFEROL 4 MCG/2ML IV SOLN
4.0000 ug | INTRAVENOUS | Status: DC
  Administered 2018-07-02: 4 ug via INTRAVENOUS
  Filled 2018-06-30: qty 2

## 2018-06-30 MED ORDER — OXYCODONE HCL 5 MG PO TABS: 5.0000 mg | ORAL_TABLET | Freq: Once | ORAL | Status: DC | PRN

## 2018-06-30 MED ORDER — LIDOCAINE-EPINEPHRINE (PF) 1 %-1:200000 IJ SOLN
INTRAMUSCULAR | Status: DC | PRN
  Administered 2018-06-30: 3 mL via INTRADERMAL

## 2018-06-30 MED ORDER — HYDROCODONE-ACETAMINOPHEN 5-325 MG PO TABS
ORAL_TABLET | ORAL | Status: AC
  Filled 2018-06-30: qty 1

## 2018-06-30 MED ORDER — PENTAFLUOROPROP-TETRAFLUOROETH EX AERO: 1.0000 "application " | INHALATION_SPRAY | CUTANEOUS | Status: DC | PRN

## 2018-06-30 MED ORDER — LIDOCAINE HCL (PF) 1 % IJ SOLN: 5.0000 mL | INTRAMUSCULAR | Status: DC | PRN

## 2018-06-30 MED ORDER — ONDANSETRON HCL 4 MG/2ML IJ SOLN: 4.0000 mg | Freq: Four times a day (QID) | INTRAMUSCULAR | Status: DC | PRN

## 2018-06-30 MED ORDER — ONDANSETRON HCL 4 MG/2ML IJ SOLN
INTRAMUSCULAR | Status: DC | PRN
  Administered 2018-06-30: 4 mg via INTRAVENOUS

## 2018-06-30 MED ORDER — MORPHINE SULFATE (PF) 2 MG/ML IV SOLN
1.0000 mg | INTRAVENOUS | Status: DC | PRN
  Administered 2018-06-30 (×2): 1 mg via INTRAVENOUS
  Filled 2018-06-30 (×2): qty 1

## 2018-06-30 MED ORDER — MUPIROCIN 2 % EX OINT
1.0000 "application " | TOPICAL_OINTMENT | Freq: Two times a day (BID) | CUTANEOUS | Status: DC
  Administered 2018-06-30 – 2018-07-02 (×5): 1 via NASAL
  Filled 2018-06-30 (×3): qty 22

## 2018-06-30 MED ORDER — SODIUM CHLORIDE 0.9 % IR SOLN
Status: DC | PRN
  Administered 2018-06-30: 1000 mL

## 2018-06-30 MED ORDER — CHLORHEXIDINE GLUCONATE CLOTH 2 % EX PADS
6.0000 | MEDICATED_PAD | Freq: Every day | CUTANEOUS | Status: DC
  Administered 2018-07-01: 6 via TOPICAL

## 2018-06-30 MED ORDER — SODIUM CHLORIDE 0.9 % IV SOLN
INTRAVENOUS | Status: DC | PRN
  Administered 2018-06-30: 30 ug/min via INTRAVENOUS

## 2018-06-30 MED ORDER — PROPOFOL 10 MG/ML IV BOLUS
INTRAVENOUS | Status: DC | PRN
  Administered 2018-06-30: 30 mg via INTRAVENOUS
  Administered 2018-06-30 (×2): 20 mg via INTRAVENOUS

## 2018-06-30 MED ORDER — SODIUM POLYSTYRENE SULFONATE 15 GM/60ML PO SUSP
30.0000 g | Freq: Once | ORAL | Status: DC
  Filled 2018-06-30: qty 120

## 2018-06-30 MED ORDER — HYDROMORPHONE HCL 1 MG/ML IJ SOLN
0.2500 mg | INTRAMUSCULAR | Status: DC | PRN
  Administered 2018-06-30: 0.5 mg via INTRAVENOUS
  Administered 2018-06-30 (×2): 0.25 mg via INTRAVENOUS

## 2018-06-30 MED ORDER — OXYCODONE HCL 5 MG PO TABS: 5.0000 mg | ORAL_TABLET | ORAL | 0 refills | Status: DC | PRN

## 2018-06-30 MED ORDER — LIDOCAINE-PRILOCAINE 2.5-2.5 % EX CREA: 1.0000 "application " | TOPICAL_CREAM | CUTANEOUS | Status: DC | PRN

## 2018-06-30 MED ORDER — FENTANYL CITRATE (PF) 100 MCG/2ML IJ SOLN
25.0000 ug | INTRAMUSCULAR | Status: DC | PRN
  Administered 2018-06-30 (×3): 50 ug via INTRAVENOUS

## 2018-06-30 MED ORDER — HYDROMORPHONE HCL 1 MG/ML IJ SOLN
INTRAMUSCULAR | Status: AC
  Administered 2018-06-30: 0.25 mg via INTRAVENOUS
  Filled 2018-06-30: qty 1

## 2018-06-30 MED ORDER — SODIUM CHLORIDE 0.9 % IV SOLN
INTRAVENOUS | Status: AC
  Filled 2018-06-30: qty 1.2

## 2018-06-30 MED ORDER — ALBUMIN HUMAN 5 % IV SOLN
INTRAVENOUS | Status: DC | PRN
  Administered 2018-06-30: 13:00:00 via INTRAVENOUS

## 2018-06-30 MED ORDER — FENTANYL CITRATE (PF) 100 MCG/2ML IJ SOLN
INTRAMUSCULAR | Status: AC
  Administered 2018-06-30: 50 ug via INTRAVENOUS
  Filled 2018-06-30: qty 2

## 2018-06-30 MED ORDER — HEPARIN SODIUM (PORCINE) 1000 UNIT/ML IJ SOLN
INTRAMUSCULAR | Status: AC
  Filled 2018-06-30: qty 1

## 2018-06-30 MED ORDER — FENTANYL CITRATE (PF) 100 MCG/2ML IJ SOLN
INTRAMUSCULAR | Status: AC
Start: 2018-06-30 — End: 2018-06-30
  Administered 2018-06-30: 50 ug via INTRAVENOUS
  Filled 2018-06-30: qty 2

## 2018-06-30 SURGICAL SUPPLY — 59 items
ADH SKN CLS APL DERMABOND .7 (GAUZE/BANDAGES/DRESSINGS) ×2
ARMBAND PINK RESTRICT EXTREMIT (MISCELLANEOUS) ×5 IMPLANT
BAG DECANTER FOR FLEXI CONT (MISCELLANEOUS) ×1 IMPLANT
BIOPATCH RED 1 DISK 7.0 (GAUZE/BANDAGES/DRESSINGS) ×3 IMPLANT
BIOPATCH RED 1IN DISK 7.0MM (GAUZE/BANDAGES/DRESSINGS) ×1
CANISTER SUCT 3000ML PPV (MISCELLANEOUS) ×4 IMPLANT
CANNULA VESSEL 3MM 2 BLNT TIP (CANNULA) ×4 IMPLANT
CATH EMB 4FR 40CM (CATHETERS) ×3 IMPLANT
CATH EMB 4FR 80CM (CATHETERS) ×1 IMPLANT
CATH PALINDROME RT-P 15FX19CM (CATHETERS) IMPLANT
CATH PALINDROME RT-P 15FX23CM (CATHETERS) IMPLANT
CATH PALINDROME RT-P 15FX28CM (CATHETERS) IMPLANT
CATH PALINDROME RT-P 15FX55CM (CATHETERS) IMPLANT
CHLORAPREP W/TINT 26ML (MISCELLANEOUS) ×1 IMPLANT
CLIP VESOCCLUDE MED 6/CT (CLIP) ×4 IMPLANT
CLIP VESOCCLUDE SM WIDE 6/CT (CLIP) ×4 IMPLANT
COVER PROBE W GEL 5X96 (DRAPES) IMPLANT
COVER SURGICAL LIGHT HANDLE (MISCELLANEOUS) ×1 IMPLANT
DECANTER SPIKE VIAL GLASS SM (MISCELLANEOUS) ×3 IMPLANT
DERMABOND ADVANCED (GAUZE/BANDAGES/DRESSINGS) ×2
DERMABOND ADVANCED .7 DNX12 (GAUZE/BANDAGES/DRESSINGS) ×2 IMPLANT
DRAPE C-ARM 42X72 X-RAY (DRAPES) ×1 IMPLANT
DRAPE CHEST BREAST 15X10 FENES (DRAPES) ×1 IMPLANT
DRAPE X-RAY CASS 24X20 (DRAPES) IMPLANT
ELECT REM PT RETURN 9FT ADLT (ELECTROSURGICAL) ×4
ELECTRODE REM PT RTRN 9FT ADLT (ELECTROSURGICAL) ×2 IMPLANT
GAUZE SPONGE 4X4 16PLY XRAY LF (GAUZE/BANDAGES/DRESSINGS) ×1 IMPLANT
GLOVE BIO SURGEON STRL SZ7.5 (GLOVE) ×4 IMPLANT
GLOVE BIOGEL PI IND STRL 8 (GLOVE) ×2 IMPLANT
GLOVE BIOGEL PI INDICATOR 8 (GLOVE) ×2
GOWN STRL REUS W/ TWL LRG LVL3 (GOWN DISPOSABLE) ×6 IMPLANT
GOWN STRL REUS W/TWL LRG LVL3 (GOWN DISPOSABLE) ×12
KIT BASIN OR (CUSTOM PROCEDURE TRAY) ×4 IMPLANT
KIT TURNOVER KIT B (KITS) ×4 IMPLANT
NDL 18GX1X1/2 (RX/OR ONLY) (NEEDLE) ×1 IMPLANT
NDL HYPO 25GX1X1/2 BEV (NEEDLE) ×1 IMPLANT
NEEDLE 18GX1X1/2 (RX/OR ONLY) (NEEDLE) IMPLANT
NEEDLE HYPO 25GX1X1/2 BEV (NEEDLE) IMPLANT
NS IRRIG 1000ML POUR BTL (IV SOLUTION) ×4 IMPLANT
PACK CV ACCESS (CUSTOM PROCEDURE TRAY) ×4 IMPLANT
PACK SURGICAL SETUP 50X90 (CUSTOM PROCEDURE TRAY) ×1 IMPLANT
PAD ARMBOARD 7.5X6 YLW CONV (MISCELLANEOUS) ×8 IMPLANT
SET COLLECT BLD 21X3/4 12 (NEEDLE) IMPLANT
SPONGE SURGIFOAM ABS GEL 100 (HEMOSTASIS) IMPLANT
STOPCOCK 4 WAY LG BORE MALE ST (IV SETS) IMPLANT
SUT ETHILON 3 0 PS 1 (SUTURE) ×1 IMPLANT
SUT PROLENE 6 0 BV (SUTURE) ×4 IMPLANT
SUT VIC AB 3-0 SH 27 (SUTURE) ×4
SUT VIC AB 3-0 SH 27X BRD (SUTURE) ×2 IMPLANT
SUT VICRYL 4-0 PS2 18IN ABS (SUTURE) ×4 IMPLANT
SYR 10ML LL (SYRINGE) ×1 IMPLANT
SYR 20CC LL (SYRINGE) ×2 IMPLANT
SYR 5ML LL (SYRINGE) ×2 IMPLANT
SYR CONTROL 10ML LL (SYRINGE) ×1 IMPLANT
TOWEL GREEN STERILE (TOWEL DISPOSABLE) ×8 IMPLANT
TOWEL GREEN STERILE FF (TOWEL DISPOSABLE) ×4 IMPLANT
TUBING EXTENTION W/L.L. (IV SETS) IMPLANT
UNDERPAD 30X30 (UNDERPADS AND DIAPERS) ×4 IMPLANT
WATER STERILE IRR 1000ML POUR (IV SOLUTION) ×4 IMPLANT

## 2018-06-30 NOTE — Consult Note (Addendum)
[facility] Renal Consultation Note    Indication for Consultation:  Management of ESRD/hemodialysis; anemia, hypertension/volume and secondary hyperparathyroidism   HPI: Fernando Jones is a 57 y.o. male with ESRD on HD TTS at [facility]. On HD since 12/2017. PMH DM/HTN, Nephrolithiasis with h/o mild lt hydronephrosis,  lt ureteral stent 12/2017.   Presented for his usual HD on 7/25 and found to have clotted AVF. While awaiting appointment for declot -had stat potassium drawn and found to be hyperkalemic so was sent to ED for treatment. K 6.3 in ED. Received Kayexalte, repeat K was 4.8 this am.  Scheduled for declot with [facility] this afternoon. He had  R RC AVF placed 12/09/17 by Dr. Donnetta Hutching. S/p IR fistulogram and PTA 05/05/18.   Seen at bedside. He does report diarrhea overnight and "black" stools for 3-4 months. Hgb 10.4. No other complaints. Denies CP, SOB, N,V,D.   Past Medical History:  Diagnosis Date  . Erectile dysfunction 2012  . ESRD (end stage renal disease) ([place])    w/Left ureteral stone/hydronephrosis/notes 12/06/2017  . Hepatitis 1973   "? kind"  . Hyperlipidemia 2012  . Hypertension   . Tobacco abuse 2012   1/2 pack per day  . Type II diabetes mellitus ([place])    Past Surgical History:  Procedure Laterality Date  . APPENDECTOMY    . AV FISTULA PLACEMENT Right 12/09/2017   Procedure: ARTERIOVENOUS (AV) FISTULA CREATION;  Surgeon: Rosetta Posner, MD;  Location: [place];  Service: Vascular;  Laterality: Right;  . CARDIAC CATHETERIZATION  05/12/2009   Archie Endo 04/07/2011  . CYSTOSCOPY/URETEROSCOPY/HOLMIUM LASER/STENT PLACEMENT Left 12/08/2017   Procedure: CYSTOSCOPY/RETROGRADE PYLEOGRAM LEFT URETEROSCOPY AND STONE EXTRACTION;  Surgeon: Festus Aloe, MD;  Location: [facility];  Service: Urology;  Laterality: Left;  . HOLMIUM LASER APPLICATION Left 12/13/8414   Procedure: HOLMIUM LASER APPLICATION;  Surgeon: Festus Aloe, MD;  Location: [facility];   Service: Urology;  Laterality: Left;  . INSERTION OF DIALYSIS CATHETER Right 12/09/2017   Procedure: EXCHANGE  OF TUNNELED  DIALYSIS CATHETER RIGHT INTERNAL JUGULAR.;  Surgeon: Rosetta Posner, MD;  Location: [place];  Service: Vascular;  Laterality: Right;  . IR AV DIALY SHUNT INTRO NEEDLE/INTRACATH INITIAL W/PTA/IMG RIGHT Right 05/05/2018  . IR FLUORO GUIDE CV LINE RIGHT  12/07/2017  . IR REMOVAL TUN CV CATH W/O FL  06/05/2018  . IR US GUIDE VASC ACCESS RIGHT  12/07/2017  . IR US GUIDE VASC ACCESS RIGHT  05/05/2018   Family History  Problem Relation Age of Onset  . Congestive Heart Failure Mother   . Diabetes Neg Hx    Social History:  reports that he quit smoking about 18 months ago. His smoking use included cigarettes. He smoked 1.00 pack per day. He has never used smokeless tobacco. He reports that he does not drink alcohol or use drugs. No Known Allergies Prior to Admission medications   Medication Sig Start Date End Date Taking? Authorizing Provider  carvedilol (COREG) 12.5 MG tablet Take 1 tablet (12.5 mg total) by mouth 2 (two) times daily with a meal. 12/16/17  Yes Bonnell Public, MD  losartan (COZAAR) 50 MG tablet Take 50 mg by mouth daily. 05/24/18  Yes [provider]  ranitidine (ZANTAC) 150 MG tablet Take 150 mg by mouth daily. 05/24/18  Yes [provider]  tamsulosin (FLOMAX) 0.4 MG CAPS capsule Take 1 capsule (0.4 mg total) by mouth daily. 12/16/17  Yes Bonnell Public, MD  aspirin EC 81  MG EC tablet Take 1 tablet (81 mg total) by mouth daily. Patient not taking: Reported on 12/06/2017 10/17/16   Murlean Iba, MD  aspirin EC 81 MG EC tablet Take 1 tablet (81 mg total) by mouth daily. Patient not taking: Reported on 06/29/2018 12/17/17   Dana Allan I, MD  Darbepoetin Alfa (ARANESP) 200 MCG/0.4ML SOSY injection 23mcg to be given with dialysis every 14 days 12/16/17   Dana Allan I, MD  hydrALAZINE (APRESOLINE) 25 MG tablet Take 1 tablet (25 mg  total) by mouth every 8 (eight) hours. Patient not taking: Reported on 06/29/2018 12/16/17   Bonnell Public, MD  Nutritional Supplements (FEEDING SUPPLEMENT, NEPRO CARB STEADY,) LIQD Take 237 mLs by mouth 2 (two) times daily between meals. Patient not taking: Reported on 06/29/2018 12/16/17   Dana Allan I, MD  pantoprazole (PROTONIX) 40 MG tablet Take 1 tablet (40 mg total) by mouth 2 (two) times daily before a meal. Patient not taking: Reported on 06/29/2018 12/16/17   Dana Allan I, MD  senna-docusate (SENOKOT-S) 8.6-50 MG tablet Take 1 tablet by mouth 2 (two) times daily. Patient not taking: Reported on 06/29/2018 12/16/17   Bonnell Public, MD   Current Facility-Administered Medications  Medication Dose Route Frequency Provider Last Rate Last Dose  . [MAR Hold] 0.9 %  sodium chloride infusion  250 mL Intravenous PRN Gala Romney L, MD      . 0.9 %  sodium chloride infusion   Intravenous Continuous Angelia Mould, MD 10 mL/hr at 06/30/18 0554    . 0.9 %  sodium chloride infusion   Intravenous Continuous Albertha Ghee, MD 10 mL/hr at 06/30/18 1108    . [MAR Hold] acetaminophen (TYLENOL) tablet 650 mg  650 mg Oral Q6H PRN Elwyn Reach, MD       Or  . Doug Sou Hold] acetaminophen (TYLENOL) suppository 650 mg  650 mg Rectal Q6H PRN Elwyn Reach, MD      . Doug Sou Hold] calcium carbonate (dosed in mg elemental calcium) suspension 500 mg of elemental calcium  500 mg of elemental calcium Oral Q6H PRN Elwyn Reach, MD      . Doug Sou Hold] camphor-menthol (SARNA) lotion 1 application  1 application Topical D4Y PRN Elwyn Reach, MD   1 application at 81/44/81 8563   And  . [MAR Hold] hydrOXYzine (ATARAX/VISTARIL) tablet 25 mg  25 mg Oral Q8H PRN Elwyn Reach, MD      . Doug Sou Hold] carvedilol (COREG) tablet 12.5 mg  12.5 mg Oral BID WC Gala Romney L, MD   12.5 mg at 06/30/18 0824  . ceFAZolin (ANCEF) IVPB 2g/100 mL premix  2 g Intravenous 30 min Pre-Op  Angelia Mould, MD      . Doug Sou Hold] Chlorhexidine Gluconate Cloth 2 % PADS 6 each  6 each Topical Q0600 Elwyn Reach, MD      . Doug Sou Hold] docusate sodium (ENEMEEZ) enema 283 mg  1 enema Rectal PRN Elwyn Reach, MD      . Doug Sou Hold] famotidine (PEPCID) tablet 20 mg  20 mg Oral BID Elwyn Reach, MD   20 mg at 06/29/18 2303  . [MAR Hold] feeding supplement (NEPRO CARB STEADY) liquid 237 mL  237 mL Oral TID PRN Elwyn Reach, MD      . Doug Sou Hold] heparin injection 5,000 Units  5,000 Units Subcutaneous Q8H Elwyn Reach, MD      . Doug Sou Hold] insulin aspart (novoLOG)  injection 0-9 Units  0-9 Units Subcutaneous TID WC Elwyn Reach, MD   2 Units at 06/30/18 [phone]  . [MAR Hold] losartan (COZAAR) tablet 50 mg  50 mg Oral Daily Elwyn Reach, MD   50 mg at 06/30/18 0824  . [MAR Hold] mupirocin ointment (BACTROBAN) 2 % 1 application  1 application Nasal BID Elwyn Reach, MD   1 application at 35/36/14 0327  . [MAR Hold] nicotine (NICODERM CQ - dosed in mg/24 hours) patch 21 mg  21 mg Transdermal Daily Elwyn Reach, MD      . Doug Sou Hold] ondansetron (ZOFRAN) tablet 4 mg  4 mg Oral Q6H PRN Elwyn Reach, MD       Or  . Doug Sou Hold] ondansetron (ZOFRAN) injection 4 mg  4 mg Intravenous Q6H PRN Elwyn Reach, MD      . Doug Sou Hold] sodium chloride flush (NS) 0.9 % injection 3 mL  3 mL Intravenous Q12H Gala Romney L, MD   3 mL at 06/29/18 2250  . [MAR Hold] sodium chloride flush (NS) 0.9 % injection 3 mL  3 mL Intravenous PRN Elwyn Reach, MD      . Doug Sou Hold] sodium polystyrene (KAYEXALATE) 15 GM/60ML suspension 30 g  30 g Oral Once Elwyn Reach, MD      . Doug Sou Hold] sorbitol 70 % solution 30 mL  30 mL Oral PRN Elwyn Reach, MD      . Doug Sou Hold] tamsulosin (FLOMAX) capsule 0.4 mg  0.4 mg Oral Daily Gala Romney L, MD   0.4 mg at 06/30/18 0825  . [MAR Hold] zolpidem (AMBIEN) tablet 5 mg  5 mg Oral QHS PRN Gala Romney L, MD         ROS: As per HPI otherwise negative.  Physical Exam: Vitals:   06/29/18 2200 06/30/18 0444 06/30/18 0848 06/30/18 1118  BP: (!) 171/84 (!) 145/72 (!) 161/81   Pulse: (!) 119 (!) 106 97   Resp: 20 18 20    Temp: 98.6 F (37 C) 98.4 F (36.9 C) 98.3 F (36.8 C)   TempSrc:   Oral   SpO2: 100% 98% 99%   Weight: 74.4 kg (164 lb)   74.4 kg (164 lb)  Height:    5\' 5"  (1.651 m)     General: WDWN male NAD  Head: NCAT sclera not icteric MMM Neck: Supple. No JVD No masses Lungs: CTA bilaterally without wheezes, rales, or rhonchi. Breathing is unlabored. Heart: RRR with S1 S2 Abdomen: soft NT + BS Lower extremities:without edema or ischemic changes, no open wounds  Neuro: A & O  X 3. Moves all extremities spontaneously. Psych:  Responds to questions appropriately with a normal affect. Dialysis Access: RUE AVF no bruit   Labs: Basic Metabolic Panel: Recent Labs  Lab 06/29/18 1729 06/29/18 2218 06/30/18 0530  NA 137  --  142  K 6.3*  --  4.8  CL 98  --  101  CO2 22  --  24  GLUCOSE 122*  --  91  BUN 111*  --  121*  CREATININE 14.22* 15.25* 15.03*  CALCIUM 9.5  --  8.7*  PHOS  --   --  7.3*   Liver Function Tests: Recent Labs  Lab 06/29/18 1729 06/30/18 0530  AST 16  --   ALT 23  --   ALKPHOS 67  --   BILITOT 0.7  --   PROT 7.2  --   ALBUMIN 3.6 3.1*  No results for input(s): LIPASE, AMYLASE in the last 168 hours. No results for input(s): AMMONIA in the last 168 hours. CBC: Recent Labs  Lab 06/29/18 1729 06/29/18 2218 06/30/18 0530  WBC 10.0 11.5* 10.1  NEUTROABS 6.8  --   --   HGB 12.0* 11.7* 10.4*  HCT 38.5* 35.7* 32.5*  MCV 97.0 95.5 95.0  PLT 279 268 250   Cardiac Enzymes: No results for input(s): CKTOTAL, CKMB, CKMBINDEX, TROPONINI in the last 168 hours. CBG: Recent Labs  Lab 06/29/18 2204 06/30/18 0753 06/30/18 1120  GLUCAP 203* 98 91   Iron Studies: No results for input(s): IRON, TIBC, TRANSFERRIN, FERRITIN in the last 72  hours. Studies/Results: Dg Chest 2 View  Result Date: 06/30/2018 CLINICAL DATA:  Elective surgery. EXAM: CHEST - 2 VIEW COMPARISON:  12/09/2017 FINDINGS: The heart size and mediastinal contours are within normal limits. Both lungs are clear. The visualized skeletal structures are unremarkable. IMPRESSION: No active cardiopulmonary disease. Electronically Signed   By: Franchot Gallo M.D.   On: 06/30/2018 07:07    Dialysis Orders:  AF TTS 400/800 EDW 71.5kg 2K/2.25Ca Na Linear Profile 4 R AVF Heparin bolus 2000 U  -Hectorol 108mcg TIW -Venofer 50mg  IV q week   Assessment/Plan: 1. Clotted AVF - for attempt to declot today per [facility]VS 2. Hyperkalemia - resolved s/p Kayexalate  3. ESRD. TTS. Last HD 7/23. HD today off schedule after declot.  4. Hypertension/volume  - BP elevated. Usually comes down with HD. UF to EDW as tolerated.  5. Anemia  - Hgb 10.4. Has been around 12 as outpatient. Reports "black" stools for months but is on iron based binder Lorin Picket)  which can have this side effect. Follow trends  6. Metabolic bone disease -  Ca ok. Phos elevated - Continue Hectorol/binders - will discuss Auryxia  7. Nutrition - Renal diet/vitamins/prostat   Lynnda Child PA-C [facility] Pager [phone] 06/30/2018, 12:16 PM   Pt seen, examined and agree w A/P as above.  [name] MD [employer] pager [phone]   06/30/2018, 4:08 PM

## 2018-06-30 NOTE — Plan of Care (Signed)
  Problem: Education: Goal: Knowledge of General Education information will improve Description Including pain rating scale, medication(s)/side effects and non-pharmacologic comfort measures Outcome: Progressing Note:  POC reviewed with pt.; informed pt. of surgery and NPO.

## 2018-06-30 NOTE — Op Note (Signed)
    NAME: Fernando Jones    MRN: [id number] DOB: February 12, 1961    DATE OF OPERATION: 06/30/2018  PREOP DIAGNOSIS:    Clotted right forearm AV fistula  POSTOP DIAGNOSIS:    Same  PROCEDURE:    Thrombectomy and revision of right radiocephalic AV fistula  SURGEON: Judeth Cornfield. Scot Dock, MD, FACS  ASSIST: Kristeen Mans, RNFA  ANESTHESIA: General  EBL: Minimal  INDICATIONS:    Fernando Jones is a 57 y.o. male who dialyzes on Tuesdays Thursdays and Saturdays.  He had thrombosis of his graft yesterday and presents for thrombectomy of his graft.  Nephrology and interventional radiology were not able to perform thrombolysis.  FINDINGS:   There was significant scarring intimal hyperplasia of the proximal fistula and I was unable to open this area.  I therefore reimplanted the fistula into the more proximal radial artery.  TECHNIQUE:   The patient was taken to the operating room and received a general anesthetic.  The right arm was prepped and draped in usual sterile fashion.  A small transverse incision was made above the area where the graft was clotted by duplex.  The fistula here was dissected free.  It was soft in this area and then had intimal hyperplasia further distally in the forearm.  A small transverse venotomy was made.  The 4 Fogarty catheter was passed the entire length into the basilic vein multiple times with no clot retrieved.  The catheter would not pass proximally.  Nor could I pass even a 2 mm dilator proximally.  Thus there was significant intimal hyperplasia in the proximal fistula which likely explains the thrombosis.  I felt it would be reasonable to try to reimplant the fistula into the more proximal radial artery and this would be the only way to try to salvage the fistula.  The radial artery was dissected free beneath the fascia.  The vein was mobilized and divided above the area of intimal hyperplasia.  The patient was heparinized.  The radial artery was  clamped proximally distally and a longitudinal arteriotomy was made.  The vein was sewn into side to the artery using continuous 6-0 Prolene suture.  At the completion there was a palpable thrill in the fistula.  There was a radial and ulnar signal with the Doppler.  Hemostasis was obtained in the wound.  The wound was closed with a deep layer of 3-0 Vicryl and the skin closed with 4-0 Vicryl.  Dermabond was applied.  Patient tolerated the procedure well and was transferred to the recovery room in stable condition.  All needle and sponge counts were correct.  Deitra Mayo, MD, FACS [facility]  DATE OF DICTATION:   06/30/2018

## 2018-06-30 NOTE — H&P (Signed)
Patient name: Fernando Jones MRN: [id number] DOB: [birth year] Sex: male   REASON FOR CONSULT:    Clotted right RC AVF.  HPI:   Fernando Jones is a pleasant 57 y.o. male,  who does HD on T[place] in [place]. He last had HD Tuesday. Yesterday it was clotted. Nephrology and IR could not get him on the schedule for thrombolysis so we were asked to attempt thrombectomy of his Right RC AVF.  He denies any recent uremic symptoms.  In reviewing his records, this patient had a left radiocephalic fistula placed on 12/09/2017. However, on exam this is a right radiocephalic fistula. At the time of his last visit on 03/19/2019specific problems were identified.  Past Medical History:  Diagnosis Date  . Erectile dysfunction 2012  . ESRD (end stage renal disease) ([place])    w/Left ureteral stone/hydronephrosis/notes 12/06/2017  . Hepatitis 1973   "? kind"  . Hyperlipidemia 2012  . Hypertension   . Tobacco abuse 2012   1/2 pack per day  . Type II diabetes mellitus ([facility])     Family History  Problem Relation Age of Onset  . Congestive Heart Failure Mother   . Diabetes Neg Hx     SOCIAL HISTORY: Social History   Socioeconomic History  . Marital status: Divorced    Spouse name: Not on file  . Number of children: Not on file  . Years of education: Not on file  . Highest education level: Not on file  Occupational History  . Not on file  Social Needs  . Financial resource strain: Not on file  . Food insecurity:    Worry: Not on file    Inability: Not on file  . Transportation needs:    Medical: Not on file    Non-medical: Not on file  Tobacco Use  . Smoking status: Former Smoker    Packs/day: 1.00    Types: Cigarettes    Last attempt to quit: 12/06/2016    Years since quitting: 1.5  . Smokeless tobacco: Never Used  Substance and Sexual Activity  . Alcohol use: No  . Drug use: No  . Sexual activity: Not Currently  Lifestyle  . Physical activity:    Days per week: Not  on file    Minutes per session: Not on file  . Stress: Not on file  Relationships  . Social connections:    Talks on phone: Not on file    Gets together: Not on file    Attends religious service: Not on file    Active member of club or organization: Not on file    Attends meetings of clubs or organizations: Not on file    Relationship status: Not on file  . Intimate partner violence:    Fear of current or ex partner: Not on file    Emotionally abused: Not on file    Physically abused: Not on file    Forced sexual activity: Not on file  Other Topics Concern  . Not on file  Social History Narrative  . Not on file    No Known Allergies  Current Facility-Administered Medications  Medication Dose Route Frequency Provider Last Rate Last Dose  . [MAR Hold] 0.9 %  sodium chloride infusion  250 mL Intravenous PRN Gala Romney L, MD      . 0.9 %  sodium chloride infusion   Intravenous Continuous Angelia Mould, MD 10 mL/hr at 06/30/18 0554    . 0.9 %  sodium chloride  infusion   Intravenous Continuous Albertha Ghee, MD 10 mL/hr at 06/30/18 1108    . [MAR Hold] acetaminophen (TYLENOL) tablet 650 mg  650 mg Oral Q6H PRN Elwyn Reach, MD       Or  . Doug Sou Hold] acetaminophen (TYLENOL) suppository 650 mg  650 mg Rectal Q6H PRN Elwyn Reach, MD      . Doug Sou Hold] calcium carbonate (dosed in mg elemental calcium) suspension 500 mg of elemental calcium  500 mg of elemental calcium Oral Q6H PRN Elwyn Reach, MD      . Doug Sou Hold] camphor-menthol (SARNA) lotion 1 application  1 application Topical Z1I PRN Elwyn Reach, MD   1 application at 96/78/93 8101   And  . [MAR Hold] hydrOXYzine (ATARAX/VISTARIL) tablet 25 mg  25 mg Oral Q8H PRN Elwyn Reach, MD      . Doug Sou Hold] carvedilol (COREG) tablet 12.5 mg  12.5 mg Oral BID WC Gala Romney L, MD   12.5 mg at 06/30/18 0824  . ceFAZolin (ANCEF) IVPB 2g/100 mL premix  2 g Intravenous 30 min Pre-Op Angelia Mould, MD      . Doug Sou Hold] Chlorhexidine Gluconate Cloth 2 % PADS 6 each  6 each Topical Q0600 Elwyn Reach, MD      . Doug Sou Hold] docusate sodium (ENEMEEZ) enema 283 mg  1 enema Rectal PRN Elwyn Reach, MD      . Doug Sou Hold] famotidine (PEPCID) tablet 20 mg  20 mg Oral BID Elwyn Reach, MD   20 mg at 06/29/18 2303  . [MAR Hold] feeding supplement (NEPRO CARB STEADY) liquid 237 mL  237 mL Oral TID PRN Elwyn Reach, MD      . Doug Sou Hold] heparin injection 5,000 Units  5,000 Units Subcutaneous Q8H Garba, Mohammad L, MD      . Doug Sou Hold] insulin aspart (novoLOG) injection 0-9 Units  0-9 Units Subcutaneous TID WC Elwyn Reach, MD   2 Units at 06/30/18 [phone]  . [MAR Hold] losartan (COZAAR) tablet 50 mg  50 mg Oral Daily Elwyn Reach, MD   50 mg at 06/30/18 0824  . [MAR Hold] mupirocin ointment (BACTROBAN) 2 % 1 application  1 application Nasal BID Elwyn Reach, MD   1 application at 25/85/27 0327  . [MAR Hold] nicotine (NICODERM CQ - dosed in mg/24 hours) patch 21 mg  21 mg Transdermal Daily Elwyn Reach, MD      . Doug Sou Hold] ondansetron (ZOFRAN) tablet 4 mg  4 mg Oral Q6H PRN Elwyn Reach, MD       Or  . Doug Sou Hold] ondansetron (ZOFRAN) injection 4 mg  4 mg Intravenous Q6H PRN Elwyn Reach, MD      . Doug Sou Hold] sodium chloride flush (NS) 0.9 % injection 3 mL  3 mL Intravenous Q12H Gala Romney L, MD   3 mL at 06/29/18 2250  . [MAR Hold] sodium chloride flush (NS) 0.9 % injection 3 mL  3 mL Intravenous PRN Elwyn Reach, MD      . Doug Sou Hold] sodium polystyrene (KAYEXALATE) 15 GM/60ML suspension 30 g  30 g Oral Once Elwyn Reach, MD      . Doug Sou Hold] sorbitol 70 % solution 30 mL  30 mL Oral PRN Elwyn Reach, MD      . Doug Sou Hold] tamsulosin (FLOMAX) capsule 0.4 mg  0.4 mg Oral Daily Elwyn Reach, MD   0.4  mg at 06/30/18 0825  . [MAR Hold] zolpidem (AMBIEN) tablet 5 mg  5 mg Oral QHS PRN Elwyn Reach, MD        REVIEW OF SYSTEMS:  [X]   denotes positive finding, [ ]  denotes negative finding Cardiac  Comments:  Chest pain or chest pressure:    Shortness of breath upon exertion:    Short of breath when lying flat:    Irregular heart rhythm:        Vascular    Pain in calf, thigh, or hip brought on by ambulation:    Pain in feet at night that wakes you up from your sleep:     Blood clot in your veins:    Leg swelling:         Pulmonary    Oxygen at home:    Productive cough:     Wheezing:         Neurologic    Sudden weakness in arms or legs:     Sudden numbness in arms or legs:     Sudden onset of difficulty speaking or slurred speech:    Temporary loss of vision in one eye:     Problems with dizziness:         Gastrointestinal    Blood in stool:     Vomited blood:         Genitourinary    Burning when urinating:     Blood in urine:        Psychiatric    Major depression:         Hematologic    Bleeding problems:    Problems with blood clotting too easily:        Skin    Rashes or ulcers:        Constitutional    Fever or chills:     PHYSICAL EXAM:   Vitals:   06/29/18 2200 06/30/18 0444 06/30/18 0848 06/30/18 1118  BP: (!) 171/84 (!) 145/72 (!) 161/81   Pulse: (!) 119 (!) 106 97   Resp: 20 18 20    Temp: 98.6 F (37 C) 98.4 F (36.9 C) 98.3 F (36.8 C)   TempSrc:   Oral   SpO2: 100% 98% 99%   Weight: 164 lb (74.4 kg)   164 lb (74.4 kg)  Height:    5\' 5"  (1.651 m)    GENERAL: The patient is a well-nourished male, in no acute distress. The vital signs are documented above. CARDIAC: There is a regular rate and rhythm.  VASCULAR: Palpable right radial pulse. No thrill in right AVF PULMONARY: There is good air exchange bilaterally without wheezing or rales. ABDOMEN: Soft and non-tender with normal pitched bowel sounds.  MUSCULOSKELETAL: There are no major deformities or cyanosis. NEUROLOGIC: No focal weakness or paresthesias are detected. SKIN: There are no ulcers or rashes  noted. PSYCHIATRIC: The patient has a normal affect.  DATA:    K = 4.8  MEDICAL ISSUES:   CLOTTED RIGHT RADIOCEPHALIC AV FISTULA: On exam this patient has a clotted right radiocephalic AV fistula. I suspect he had an infiltrate yesterday during dialysis. I've explained that we will do our best to try to thrombectomize the fistula but if this is not possible we will have to place a tunneled dialysis catheter. I have discussed the procedure and potential complications with the patient and he is agreeable to proceed. All his questions were answered and encouraged.  Deitra Mayo [facility] [phone]

## 2018-06-30 NOTE — Progress Notes (Signed)
HD tx completed w/o problem, UF goal met, blood rinsed back, VSS, pain is uncontrolled, prn pain med admin @ 2310, pt is in immense pain and is crying uncrontrollably, report given to Sima Matas, RN and asked if they could please f/u w/ primary for some IV pain meds if appropriate for pt

## 2018-06-30 NOTE — Anesthesia Preprocedure Evaluation (Signed)
Anesthesia Evaluation  Patient identified by MRN, date of birth, ID band Patient awake    Reviewed: Allergy & Precautions, NPO status , Patient's Chart, lab work & pertinent test results  Airway Mallampati: II   Neck ROM: full    Dental   Pulmonary former smoker,    breath sounds clear to auscultation       Cardiovascular hypertension,  Rhythm:regular Rate:Normal     Neuro/Psych    GI/Hepatic   Endo/Other  diabetes, Type 2  Renal/GU ESRF and DialysisRenal disease     Musculoskeletal   Abdominal   Peds  Hematology   Anesthesia Other Findings   Reproductive/Obstetrics                             Anesthesia Physical Anesthesia Plan  ASA: III  Anesthesia Plan: General   Post-op Pain Management:    Induction: Intravenous  PONV Risk Score and Plan: 2 and Ondansetron and Treatment may vary due to age or medical condition  Airway Management Planned: LMA  Additional Equipment:   Intra-op Plan:   Post-operative Plan:   Informed Consent: I have reviewed the patients History and Physical, chart, labs and discussed the procedure including the risks, benefits and alternatives for the proposed anesthesia with the patient or authorized representative who has indicated his/her understanding and acceptance.     Plan Discussed with: CRNA, Anesthesiologist and Surgeon  Anesthesia Plan Comments:         Anesthesia Quick Evaluation

## 2018-06-30 NOTE — Progress Notes (Signed)
HD tx initiated via 16Gx3 (infiltration of 1st attempt at venous site) s/p declot, pull/push/flush well, VSS, will cont to monitor while on HD tx

## 2018-06-30 NOTE — Plan of Care (Signed)
?  Problem: Education: ?Goal: Knowledge of General Education information will improve ?Description: Including pain rating scale, medication(s)/side effects and non-pharmacologic comfort measures ?Outcome: Progressing ?  ?Problem: Health Behavior/Discharge Planning: ?Goal: Ability to manage health-related needs will improve ?Outcome: Progressing ?  ?Problem: Clinical Measurements: ?Goal: Ability to maintain clinical measurements within normal limits will improve ?Outcome: Progressing ?Goal: Will remain free from infection ?Outcome: Progressing ?Goal: Diagnostic test results will improve ?Outcome: Progressing ?Goal: Respiratory complications will improve ?Outcome: Progressing ?Goal: Cardiovascular complication will be avoided ?Outcome: Progressing ?  ?Problem: Coping: ?Goal: Level of anxiety will decrease ?Outcome: Progressing ?  ?Problem: Pain Managment: ?Goal: General experience of comfort will improve ?Outcome: Progressing ?  ?Problem: Safety: ?Goal: Ability to remain free from injury will improve ?Outcome: Progressing ?  ?Problem: Skin Integrity: ?Goal: Risk for impaired skin integrity will decrease ?Outcome: Progressing ?  ?

## 2018-06-30 NOTE — Transfer of Care (Signed)
Immediate Anesthesia Transfer of Care Note  Patient: Fernando Jones  Procedure(s) Performed: THROMBECTOMY and revision ARTERIOVENOUS FISTULA right RADIOCEPHALIC (Right Arm Lower)  Patient Location: PACU  Anesthesia Type:General  Level of Consciousness: awake  Airway & Oxygen Therapy: Patient Spontanous Breathing and Patient connected to nasal cannula oxygen  Post-op Assessment: Report given to RN and Post -op Vital signs reviewed and stable  Post vital signs: Reviewed and stable  Last Vitals:  Vitals Value Taken Time  BP    Temp    Pulse    Resp    SpO2      Last Pain:  Vitals:   06/30/18 0848  TempSrc: Oral  PainSc:          Complications: No apparent anesthesia complications

## 2018-06-30 NOTE — Progress Notes (Signed)
Pt. transported from ER via stretcher to 5MW-06; alert and oriented x4; independent in room; oriented to room and call button; aware of surgery for 06/30/18.

## 2018-06-30 NOTE — Anesthesia Procedure Notes (Signed)
Procedure Name: LMA Insertion Date/Time: 06/30/2018 12:28 PM Performed by: Eligha Bridegroom, CRNA Pre-anesthesia Checklist: Patient identified, Emergency Drugs available, Suction available, Patient being monitored and Timeout performed Patient Re-evaluated:Patient Re-evaluated prior to induction Oxygen Delivery Method: Circle system utilized Preoxygenation: Pre-oxygenation with 100% oxygen Induction Type: IV induction LMA Size: 4.0 Placement Confirmation: positive ETCO2 and breath sounds checked- equal and bilateral Tube secured with: Tape Dental Injury: Teeth and Oropharynx as per pre-operative assessment

## 2018-06-30 NOTE — Progress Notes (Signed)
PROGRESS NOTE    Fernando Jones  RSW:[id number] DOB: [birth year] DOA: 06/29/2018  PCP: Patient, No Pcp Per   Outpatient Specialists: [facility]   Brief Narrative:   Fernando Jones is a 57 y.o. male with medical history significant of end-stage renal disease, diabetes, hypertension, GERD as well as tobacco abuse who is on hemodialysis on Tuesdays Thursdays and Saturdays.  Patient had malfunctioning dialysis AV fistula. He was supposed to get thrombectomy of the thrombosed AV fistula but then had hyperkalemia with potassium of 6.3 and significant pain so was admitted to have the procedure done in the hospital.   Assessment & Plan:   Principal Problem:   Hyperkalemia Active Problems:   DM (diabetes mellitus), type 2 with renal complications ([place])   TOBACCO ABUSE   Essential hypertension   ESRD (end stage renal disease) ([place])   #1 hyperkalemia: Potassium still elevated but improved with Kayexalate.  We will give another dose of Kayexalate prior to procedure this morning.  Patient is to have thrombectomy for the clotted right AV fistula.  #2 ESRD: For hemodialysis today.  #3 tobacco abuse: Tobacco cessation counseling given.  Continue monitoring  #4 diabetes: Blood sugar is slightly elevated.  Continue sliding scale insulin on current regimen  #5 hypertension: blood pressure is likely to improve after hemodialysis.  #6 MRSA Positive: nasal swab. Contact precaution and Bactroban   DVT prophylaxis: heparin Code Status: full code Family Communication: no family available Disposition Plan: home when ready   Consultants:   Dr. Scot Dock, Vascular surgery  Dr Jonnie Finner, Nephrology   Procedures:    Thrombectomy and revision of right radiocephalic AV fistula     Antimicrobials:  None  Subjective: Patient is doing better after surgery.  He has significant pain in his hand postoperatively.  No fever or chills.  Objective: Vitals:   06/30/18  1515 06/30/18 1530 06/30/18 1545 06/30/18 1600  BP: (!) 165/60 (!) 155/56 (!) 144/59 (!) 149/71  Pulse: 92 92 (!) 105 93  Resp: (!) 22 20 20 16   Temp:   (!) 97 F (36.1 C) 97.8 F (36.6 C)  TempSrc:    Oral  SpO2: 100% 97% 100% 100%  Weight:      Height:        Intake/Output Summary (Last 24 hours) at 06/30/2018 1626 Last data filed at 06/30/2018 1336 Gross per 24 hour  Intake 900 ml  Output 25 ml  Net 875 ml   Filed Weights   06/29/18 1724 06/29/18 2200 06/30/18 1118  Weight: 74.4 kg (164 lb 0.4 oz) 74.4 kg (164 lb) 74.4 kg (164 lb)    Examination:  General exam: Appears calm and comfortable  Respiratory system: Clear to auscultation. Respiratory effort normal. Cardiovascular system: S1 & S2 heard, RRR. No JVD, murmurs, rubs, gallops or clicks. No pedal edema. Gastrointestinal system: Abdomen is nondistended, soft and nontender. No organomegaly or masses felt. Normal bowel sounds heard. Central nervous system: Alert and oriented. No focal neurological deficits. Extremities: Symmetric 5 x 5 power. Skin: No rashes, lesions or ulcers Psychiatry: Judgement and insight appear normal. Mood & affect appropriate.     Data Reviewed: I have personally reviewed following labs and imaging studies  CBC: Recent Labs  Lab 06/29/18 1729 06/29/18 2218 06/30/18 0530  WBC 10.0 11.5* 10.1  NEUTROABS 6.8  --   --   HGB 12.0* 11.7* 10.4*  HCT 38.5* 35.7* 32.5*  MCV 97.0 95.5 95.0  PLT 279 268 093   Basic Metabolic Panel:  Recent Labs  Lab 06/29/18 1729 06/29/18 2218 06/30/18 0530  NA 137  --  142  K 6.3*  --  4.8  CL 98  --  101  CO2 22  --  24  GLUCOSE 122*  --  91  BUN 111*  --  121*  CREATININE 14.22* 15.25* 15.03*  CALCIUM 9.5  --  8.7*  PHOS  --   --  7.3*   GFR: Estimated Creatinine Clearance: 5.1 mL/min (A) (by C-G formula based on SCr of 15.03 mg/dL (H)). Liver Function Tests: Recent Labs  Lab 06/29/18 1729 06/30/18 0530  AST 16  --   ALT 23  --   ALKPHOS  67  --   BILITOT 0.7  --   PROT 7.2  --   ALBUMIN 3.6 3.1*   No results for input(s): LIPASE, AMYLASE in the last 168 hours. No results for input(s): AMMONIA in the last 168 hours. Coagulation Profile: No results for input(s): INR, PROTIME in the last 168 hours. Cardiac Enzymes: No results for input(s): CKTOTAL, CKMB, CKMBINDEX, TROPONINI in the last 168 hours. BNP (last 3 results) No results for input(s): PROBNP in the last 8760 hours. HbA1C: No results for input(s): HGBA1C in the last 72 hours. CBG: Recent Labs  Lab 06/29/18 2204 06/30/18 0753 06/30/18 1120 06/30/18 1407  GLUCAP 203* 98 91 116*   Lipid Profile: No results for input(s): CHOL, HDL, LDLCALC, TRIG, CHOLHDL, LDLDIRECT in the last 72 hours. Thyroid Function Tests: No results for input(s): TSH, T4TOTAL, FREET4, T3FREE, THYROIDAB in the last 72 hours. Anemia Panel: No results for input(s): VITAMINB12, FOLATE, FERRITIN, TIBC, IRON, RETICCTPCT in the last 72 hours. Urine analysis:    Component Value Date/Time   COLORURINE YELLOW 12/06/2017 1339   APPEARANCEUR CLEAR 12/06/2017 1339   LABSPEC 1.011 12/06/2017 1339   PHURINE 5.0 12/06/2017 1339   GLUCOSEU 50 (A) 12/06/2017 1339   HGBUR MODERATE (A) 12/06/2017 1339   BILIRUBINUR NEGATIVE 12/06/2017 1339   KETONESUR NEGATIVE 12/06/2017 1339   PROTEINUR >=300 (A) 12/06/2017 1339   NITRITE NEGATIVE 12/06/2017 1339   LEUKOCYTESUR NEGATIVE 12/06/2017 1339   Sepsis Labs: @LABRCNTIP (procalcitonin:4,lacticidven:4)  ) Recent Results (from the past 240 hour(s))  Surgical pcr screen     Status: Abnormal   Collection Time: 06/29/18 11:35 PM  Result Value Ref Range Status   MRSA, PCR POSITIVE (A) NEGATIVE Final    Comment: RESULT CALLED TO, READ BACK BY AND VERIFIED WITH: Donnelly Angelica RN 06/30/18 0301 JDW    Staphylococcus aureus POSITIVE (A) NEGATIVE Final    Comment: (NOTE) The Xpert SA Assay (FDA approved for NASAL specimens in patients 80 years of age and older),  is one component of a comprehensive surveillance program. It is not intended to diagnose infection nor to guide or monitor treatment. Performed at [facility], [place] [address] [place], [place] [address]          Radiology Studies: Dg Chest 2 View  Result Date: 06/30/2018 CLINICAL DATA:  Elective surgery. EXAM: CHEST - 2 VIEW COMPARISON:  12/09/2017 FINDINGS: The heart size and mediastinal contours are within normal limits. Both lungs are clear. The visualized skeletal structures are unremarkable. IMPRESSION: No active cardiopulmonary disease. Electronically Signed   By: Franchot Gallo M.D.   On: 06/30/2018 07:07        Scheduled Meds: . carvedilol  12.5 mg Oral BID WC  . Chlorhexidine Gluconate Cloth  6 each Topical Q0600  . [START ON 07/01/2018] doxercalciferol  4  mcg Intravenous Q T,Th,Sa-HD  . famotidine  20 mg Oral BID  . ferric citrate  210 mg Oral TID WC  . heparin  5,000 Units Subcutaneous Q8H  . insulin aspart  0-9 Units Subcutaneous TID WC  . losartan  50 mg Oral Daily  . mupirocin ointment  1 application Nasal BID  . nicotine  21 mg Transdermal Daily  . sodium chloride flush  3 mL Intravenous Q12H  . sodium polystyrene  30 g Oral Once  . tamsulosin  0.4 mg Oral Daily   Continuous Infusions: . sodium chloride    . sodium chloride 10 mL/hr at 06/30/18 1108     LOS: 1 day    Time spent: 2 minutes  GARBA,LAWAL, MD [facility] Pager [phone] If 7PM-7AM, please contact night-coverage [email or web address] Password [facility] 06/30/2018, 4:26 PM

## 2018-06-30 NOTE — Progress Notes (Signed)
CRITICAL VALUE ALERT  Critical Value: Surgical PCR -positive for MRSA and Staph  Date & Time Notified:  06/30/18 0340  Provider Notified: Samuella Bruin  Orders Received/Actions taken: orders per protocol

## 2018-07-01 LAB — GLUCOSE, CAPILLARY
Glucose-Capillary: 110 mg/dL — ABNORMAL HIGH (ref 70–99)
Glucose-Capillary: 125 mg/dL — ABNORMAL HIGH (ref 70–99)
Glucose-Capillary: 127 mg/dL — ABNORMAL HIGH (ref 70–99)
Glucose-Capillary: 128 mg/dL — ABNORMAL HIGH (ref 70–99)
Glucose-Capillary: 150 mg/dL — ABNORMAL HIGH (ref 70–99)

## 2018-07-01 MED ORDER — HYDROMORPHONE HCL 1 MG/ML IJ SOLN
0.5000 mg | INTRAMUSCULAR | Status: DC | PRN
  Administered 2018-07-01: 0.5 mg via INTRAVENOUS
  Filled 2018-07-01: qty 1

## 2018-07-01 MED ORDER — HYDROMORPHONE HCL 1 MG/ML IJ SOLN
0.5000 mg | Freq: Once | INTRAMUSCULAR | Status: AC
  Administered 2018-07-01: 0.5 mg via INTRAVENOUS
  Filled 2018-07-01: qty 1

## 2018-07-01 MED ORDER — POLYETHYLENE GLYCOL 3350 17 G PO PACK
17.0000 g | PACK | Freq: Every day | ORAL | Status: DC
  Administered 2018-07-02: 17 g via ORAL
  Filled 2018-07-01: qty 1

## 2018-07-01 MED ORDER — MIDAZOLAM BOLUS VIA INFUSION
1.0000 mg | Freq: Once | INTRAVENOUS | Status: DC
  Filled 2018-07-01: qty 1

## 2018-07-01 MED ORDER — SENNOSIDES-DOCUSATE SODIUM 8.6-50 MG PO TABS
1.0000 | ORAL_TABLET | Freq: Two times a day (BID) | ORAL | Status: DC
  Administered 2018-07-01 – 2018-07-02 (×2): 1 via ORAL
  Filled 2018-07-01 (×2): qty 1

## 2018-07-01 MED ORDER — MORPHINE SULFATE (PF) 4 MG/ML IV SOLN: 5.0000 mg | INTRAVENOUS | Status: DC | PRN

## 2018-07-01 MED ORDER — PROMETHAZINE HCL 25 MG/ML IJ SOLN
6.2500 mg | Freq: Four times a day (QID) | INTRAMUSCULAR | Status: DC | PRN
  Administered 2018-07-01: 6.25 mg via INTRAVENOUS
  Filled 2018-07-01: qty 1

## 2018-07-01 NOTE — Progress Notes (Addendum)
[facility] Progress Note   Subjective:  S/p thrombectomy/revision of AVF Had HD yesterday after procedure  Nausea/vomiting this am.   Objective Vitals:   07/01/18 0022 07/01/18 0059 07/01/18 0546 07/01/18 1002  BP: (!) 153/71 133/89 (!) 195/83 (!) 172/66  Pulse: 99 (!) 101 90 90  Resp: (!) 24 (!) 24 18 16   Temp: 99.2 F (37.3 C) 98.6 F (37 C) 98.7 F (37.1 C) 98.4 F (36.9 C)  TempSrc: Oral Oral Oral Oral  SpO2: 100% 100% 100% 100%  Weight:      Height:       Physical Exam General: WNWD male NAD Heart: RRR Lungs: CTAB  Abdomen: soft NT/ND Extremities: No LE edema Dialysis Access: RUE AVF +bruit   Dialysis Orders:  AF TTS 400/800 EDW 71.5kg 2K/2.25Ca Na Linear Profile 4 R AVF Heparin bolus 2000 U  -Hectorol 40mcg TIW -Venofer 50mg  IV q week   Assessment/Plan: 1. Clotted AVF -s/p thrombectomy/revision of AVF Dr. Scot Dock 7/26 2. Hyperkalemia - resolved  3. ESRD. TTS. Short HD today to get back on schedule.  4. Hypertension/volume  - BP elevated but having pain this morning.  Usually comes down with HD. Net UF 3L post wt 72.7kg. UF to EDW as tolerated.  5. Anemia  - Hgb 10.4. Has been around 12 as outpatient. Reports "black" stools for months but is on iron based binder Lorin Picket)  which can have this side effect. Follow trends  6. Metabolic bone disease -  Ca ok. Phos elevated - Continue Hectorol/binders - Auryxia discussed.  7. Nutrition - Renal diet/vitamins/prostat  8. Dispo- Original plan for discharge today but unable to drive himself to outpatient dialysis- will have HD in hospital today and hopefully discharge tomorrow.    Lynnda Child PA-C [facility] Pager [phone] 07/01/2018,10:06 AM  LOS: 2 days   Pt seen, examined and agree w A/P as above.  [name] MD [facility] pager [phone]   07/01/2018, 10:35 AM    Additional Objective Labs: Basic Metabolic Panel: Recent Labs  Lab  06/29/18 1729 06/29/18 2218 06/30/18 0530  NA 137  --  142  K 6.3*  --  4.8  CL 98  --  101  CO2 22  --  24  GLUCOSE 122*  --  91  BUN 111*  --  121*  CREATININE 14.22* 15.25* 15.03*  CALCIUM 9.5  --  8.7*  PHOS  --   --  7.3*   CBC: Recent Labs  Lab 06/29/18 1729 06/29/18 2218 06/30/18 0530  WBC 10.0 11.5* 10.1  NEUTROABS 6.8  --   --   HGB 12.0* 11.7* 10.4*  HCT 38.5* 35.7* 32.5*  MCV 97.0 95.5 95.0  PLT 279 268 250   Blood Culture No results found for: SDES, SPECREQUEST, CULT, REPTSTATUS  Cardiac Enzymes: No results for input(s): CKTOTAL, CKMB, CKMBINDEX, TROPONINI in the last 168 hours. CBG: Recent Labs  Lab 06/30/18 1120 06/30/18 1407 06/30/18 1625 07/01/18 0017 07/01/18 0747  GLUCAP 91 116* 107* 125* 127*   Iron Studies: No results for input(s): IRON, TIBC, TRANSFERRIN, FERRITIN in the last 72 hours. Lab Results  Component Value Date   INR 1.25 12/07/2017   INR 1.0 05/12/2009   INR 1.0 05/09/2009   Medications: . sodium chloride    . sodium chloride 10 mL/hr at 06/30/18 1108  . sodium chloride    . sodium chloride     . carvedilol  12.5 mg Oral BID WC  .  Chlorhexidine Gluconate Cloth  6 each Topical Q0600  . doxercalciferol  4 mcg Intravenous Q T,Th,Sa-HD  . famotidine  20 mg Oral BID  . ferric citrate  210 mg Oral TID WC  . heparin  5,000 Units Subcutaneous Q8H  . insulin aspart  0-9 Units Subcutaneous TID WC  . losartan  50 mg Oral Daily  . midazolam  1 mg Intravenous Once  . mupirocin ointment  1 application Nasal BID  . nicotine  21 mg Transdermal Daily  . polyethylene glycol  17 g Oral Daily  . senna-docusate  1 tablet Oral BID  . sodium chloride flush  3 mL Intravenous Q12H  . sodium polystyrene  30 g Oral Once  . tamsulosin  0.4 mg Oral Daily

## 2018-07-01 NOTE — Progress Notes (Signed)
Reevaluated patient for pain after giving Dilaudid 0.5mg  IV. Patient is sleeping and resting comfortably in bed. Will hold off on further pain medications for now.

## 2018-07-01 NOTE — Progress Notes (Signed)
   VASCULAR SURGERY ASSESSMENT & PLAN:   1 Day Post-Op s/p: Thrombectomy and revision of right radiocephalic AV fistula.  I saw him last night with severe pain in his right arm for really no explainable reason.  This morning his pain is minimal.  I will arrange follow-up as an outpatient.  He can be discharged from my standpoint.  SUBJECTIVE:   Minimal pain.  PHYSICAL EXAM:   Vitals:   06/30/18 2331 07/01/18 0022 07/01/18 0059 07/01/18 0546  BP: 124/86 (!) 153/71 133/89 (!) 195/83  Pulse: (!) 104 99 (!) 101 90  Resp: (!) 27 (!) 24 (!) 24 18  Temp: 98.4 F (36.9 C) 99.2 F (37.3 C) 98.6 F (37 C) 98.7 F (37.1 C)  TempSrc: Oral Oral Oral Oral  SpO2: 99% 100% 100% 100%  Weight: 160 lb 4.4 oz (72.7 kg)     Height:       Palpable right radial pulse. Good thrill in right radiocephalic AV fistula. Right hand is warm and well-perfused with good motor function. The incision looks fine.  LABS:   Lab Results  Component Value Date   WBC 10.1 06/30/2018   HGB 10.4 (L) 06/30/2018   HCT 32.5 (L) 06/30/2018   MCV 95.0 06/30/2018   PLT 250 06/30/2018   Lab Results  Component Value Date   CREATININE 15.03 (H) 06/30/2018   Lab Results  Component Value Date   INR 1.25 12/07/2017   CBG (last 3)  Recent Labs    06/30/18 1625 07/01/18 0017 07/01/18 0747  GLUCAP 107* 125* 127*    PROBLEM LIST:    Principal Problem:   Hyperkalemia Active Problems:   DM (diabetes mellitus), type 2 with renal complications ([place])   TOBACCO ABUSE   Essential hypertension   ESRD (end stage renal disease) ([facility])   CURRENT MEDS:   . carvedilol  12.5 mg Oral BID WC  . Chlorhexidine Gluconate Cloth  6 each Topical Q0600  . doxercalciferol  4 mcg Intravenous Q T,Th,Sa-HD  . famotidine  20 mg Oral BID  . ferric citrate  210 mg Oral TID WC  . heparin  5,000 Units Subcutaneous Q8H  . insulin aspart  0-9 Units Subcutaneous TID WC  . losartan  50 mg Oral Daily  . midazolam  1 mg Intravenous  Once  . mupirocin ointment  1 application Nasal BID  . nicotine  21 mg Transdermal Daily  . polyethylene glycol  17 g Oral Daily  . senna-docusate  1 tablet Oral BID  . sodium chloride flush  3 mL Intravenous Q12H  . sodium polystyrene  30 g Oral Once  . tamsulosin  0.4 mg Oral Daily    Deitra Mayo Beeper: [phone] Office: [phone] 07/01/2018

## 2018-07-01 NOTE — Progress Notes (Signed)
VASCULAR SURGERY  Called to see patient because of pain in right arm. Palpable right radial pulse. Good thrill in right RC AVF. Incision looks fine. No arm swelling. Will give IV Morphine for pain.   Deitra Mayo, MD, [place] [phone] Office: [phone]

## 2018-07-01 NOTE — Progress Notes (Signed)
PROGRESS NOTE  Fernando Jones WUX:[id number] DOB: [birth year] DOA: 06/29/2018 PCP: Patient, No Pcp Per  HPI/Recap of past 24 hours: Fernando Jones a 57 y.o.malewith medical history significant ofend-stage renal disease on hemodialysis Tuesday Thursday and Saturday, diabetes, hypertension, GERD as well as tobacco abuse who presented due to malfunctioning dialysis AV fistula.He was supposed to get thrombectomy of the thrombosed AV fistula but then had hyperkalemia with potassium of 6.3 and significant pain so was admitted to have the procedure done in the hospital.  07/01/2018: Patient seen and examined at his bedside.  Persistent nausea and vomiting.  Added IV Phenergan for refractory nausea.  Will get hemodialysis in the hospital today and possible discharge tomorrow with improvement of nausea.     Assessment/Plan: Principal Problem:   Hyperkalemia Active Problems:   DM (diabetes mellitus), type 2 with renal complications (HCC)   TOBACCO ABUSE   Essential hypertension   ESRD (end stage renal disease) (HCC)   Intractable nausea and vomiting suspect secondary to uremia Continue IV Zofran and alternate with IV Phenergan for refractory nausea and vomiting  Encourage oral intake as tolerated Hemodialysis plan today  Hyperkalemia in the setting of end-stage renal disease Received 1 g IV calcium gluconate and 30 g Kayexalate Hold insulin to avoid hypoglycemia Will get hemodialysis today Repeat BMP in the morning  POD #1 status post thrombectomy and revision of right radiocephalic AV fistula Good thrill on palm feeling of fistula and good radial pulse.   Vascular surgery following.  Highly appreciated Pain management in place  End-stage renal disease on hemodialysis Tuesday Thursday Saturday HD planned today Nephrology following.  Highly appreciated.  Hypertension Blood pressure will likely improve after hemodialysis Resume oral anti-hypertensive medication when  able to tolerate p.o.  MRSA suspect colonization Contact precaution No sign of active infective process   Code Status: Full code  Family Communication: None at bedside  Disposition Plan: Discharge possibly tomorrow 07/02/2018    Consultants:  Nephrology  Vascular surgery  Procedures:  HD on 07/01/2018  Antimicrobials:  None  DVT prophylaxis: Subcu heparin 3 times daily   Objective: Vitals:   07/01/18 0022 07/01/18 0059 07/01/18 0546 07/01/18 1002  BP: (!) 153/71 133/89 (!) 195/83 (!) 172/66  Pulse: 99 (!) 101 90 90  Resp: (!) 24 (!) 24 18 16   Temp: 99.2 F (37.3 C) 98.6 F (37 C) 98.7 F (37.1 C) 98.4 F (36.9 C)  TempSrc: Oral Oral Oral Oral  SpO2: 100% 100% 100% 100%  Weight:      Height:        Intake/Output Summary (Last 24 hours) at 07/01/2018 1437 Last data filed at 07/01/2018 0900 Gross per 24 hour  Intake 120 ml  Output 3256 ml  Net -3136 ml   Filed Weights   06/30/18 1118 06/30/18 1857 06/30/18 2331  Weight: 74.4 kg (164 lb) 75.7 kg (166 lb 14.2 oz) 72.7 kg (160 lb 4.4 oz)    Exam:  . General: 57 y.o. year-old male well developed well nourished in no acute distress.  Alert and oriented x3. . Cardiovascular: Regular rate and rhythm with no rubs or gallops.  No thyromegaly or JVD noted.   Marland Kitchen Respiratory: Clear to auscultation with no wheezes or rales. Good inspiratory effort. . Abdomen: Soft nontender nondistended with normal bowel sounds x4 quadrants. . Musculoskeletal: No lower extremity edema. 2/4 pulses in all 4 extremities.  Good thrill on feeling of fistula.  Good right radial pulse. Marland Kitchen Psychiatry: Mood is appropriate for  condition and setting   Data Reviewed: CBC: Recent Labs  Lab 06/29/18 1729 06/29/18 2218 06/30/18 0530  WBC 10.0 11.5* 10.1  NEUTROABS 6.8  --   --   HGB 12.0* 11.7* 10.4*  HCT 38.5* 35.7* 32.5*  MCV 97.0 95.5 95.0  PLT 279 268 342   Basic Metabolic Panel: Recent Labs  Lab 06/29/18 1729 06/29/18 2218  06/30/18 0530  NA 137  --  142  K 6.3*  --  4.8  CL 98  --  101  CO2 22  --  24  GLUCOSE 122*  --  91  BUN 111*  --  121*  CREATININE 14.22* 15.25* 15.03*  CALCIUM 9.5  --  8.7*  PHOS  --   --  7.3*   GFR: Estimated Creatinine Clearance: 4.7 mL/min (A) (by C-G formula based on SCr of 15.03 mg/dL (H)). Liver Function Tests: Recent Labs  Lab 06/29/18 1729 06/30/18 0530  AST 16  --   ALT 23  --   ALKPHOS 67  --   BILITOT 0.7  --   PROT 7.2  --   ALBUMIN 3.6 3.1*   No results for input(s): LIPASE, AMYLASE in the last 168 hours. No results for input(s): AMMONIA in the last 168 hours. Coagulation Profile: No results for input(s): INR, PROTIME in the last 168 hours. Cardiac Enzymes: No results for input(s): CKTOTAL, CKMB, CKMBINDEX, TROPONINI in the last 168 hours. BNP (last 3 results) No results for input(s): PROBNP in the last 8760 hours. HbA1C: No results for input(s): HGBA1C in the last 72 hours. CBG: Recent Labs  Lab 06/30/18 1407 06/30/18 1625 07/01/18 0017 07/01/18 0747 07/01/18 1151  GLUCAP 116* 107* 125* 127* 110*   Lipid Profile: No results for input(s): CHOL, HDL, LDLCALC, TRIG, CHOLHDL, LDLDIRECT in the last 72 hours. Thyroid Function Tests: No results for input(s): TSH, T4TOTAL, FREET4, T3FREE, THYROIDAB in the last 72 hours. Anemia Panel: No results for input(s): VITAMINB12, FOLATE, FERRITIN, TIBC, IRON, RETICCTPCT in the last 72 hours. Urine analysis:    Component Value Date/Time   COLORURINE YELLOW 12/06/2017 1339   APPEARANCEUR CLEAR 12/06/2017 1339   LABSPEC 1.011 12/06/2017 1339   PHURINE 5.0 12/06/2017 1339   GLUCOSEU 50 (A) 12/06/2017 1339   HGBUR MODERATE (A) 12/06/2017 1339   BILIRUBINUR NEGATIVE 12/06/2017 1339   KETONESUR NEGATIVE 12/06/2017 1339   PROTEINUR >=300 (A) 12/06/2017 1339   NITRITE NEGATIVE 12/06/2017 1339   LEUKOCYTESUR NEGATIVE 12/06/2017 1339   Sepsis Labs: @LABRCNTIP (procalcitonin:4,lacticidven:4)  ) Recent  Results (from the past 240 hour(s))  Surgical pcr screen     Status: Abnormal   Collection Time: 06/29/18 11:35 PM  Result Value Ref Range Status   MRSA, PCR POSITIVE (A) NEGATIVE Final    Comment: RESULT CALLED TO, READ BACK BY AND VERIFIED WITH: Donnelly Angelica RN 06/30/18 0301 JDW    Staphylococcus aureus POSITIVE (A) NEGATIVE Final    Comment: (NOTE) The Xpert SA Assay (FDA approved for NASAL specimens in patients 70 years of age and older), is one component of a comprehensive surveillance program. It is not intended to diagnose infection nor to guide or monitor treatment. Performed at [facility], [place] [address], [place], [place] [address]       Studies: No results found.  Scheduled Meds: . carvedilol  12.5 mg Oral BID WC  . Chlorhexidine Gluconate Cloth  6 each Topical Q0600  . doxercalciferol  4 mcg Intravenous Q T,Th,Sa-HD  . famotidine  20 mg Oral  BID  . ferric citrate  210 mg Oral TID WC  . heparin  5,000 Units Subcutaneous Q8H  . insulin aspart  0-9 Units Subcutaneous TID WC  . losartan  50 mg Oral Daily  . midazolam  1 mg Intravenous Once  . mupirocin ointment  1 application Nasal BID  . nicotine  21 mg Transdermal Daily  . polyethylene glycol  17 g Oral Daily  . senna-docusate  1 tablet Oral BID  . sodium chloride flush  3 mL Intravenous Q12H  . sodium polystyrene  30 g Oral Once  . tamsulosin  0.4 mg Oral Daily    Continuous Infusions: . sodium chloride    . sodium chloride 10 mL/hr at 06/30/18 1108  . sodium chloride    . sodium chloride       LOS: 2 days     Kayleen Memos, MD [facility] Pager [phone]  If 7PM-7AM, please contact night-coverage [email or web address] Password [facility] 07/01/2018, 2:37 PM

## 2018-07-01 NOTE — Progress Notes (Signed)
This is not a rapid response call.  Tina RN notified me of Fernando Jones persistent pain in his arm regardless of pain medicine.  She asked if I would assess him for compartment syndrome/steal. Fernando Jones is alert, oriented x4 and c/o 10/10 pain in his right distal arm.  He is s/p thrombectomy and revision of his Right FA fistula by Dr. Scot Dock on 7/26.  Fernando Jones was unable to extend his fingers and c/o "tingling" in the fingers of his right hand.  He has doppled radial and ulnar pulses distally from the incision.  He also has a bruit and thrill just proximal to the incision.  Fernando Jones also just recently finished HD in that fistula.  The incision is dermabond and well approximated with no drainage.  There is swelling to the forearm with some bruising proximally from the incision.  Capillary refill in his fingers is 3 secs or less.  Otila Kluver is notifying Vascular on call of patient's condition.

## 2018-07-02 DIAGNOSIS — N186 End stage renal disease: Secondary | ICD-10-CM

## 2018-07-02 DIAGNOSIS — Z419 Encounter for procedure for purposes other than remedying health state, unspecified: Secondary | ICD-10-CM

## 2018-07-02 DIAGNOSIS — F17200 Nicotine dependence, unspecified, uncomplicated: Secondary | ICD-10-CM

## 2018-07-02 DIAGNOSIS — E875 Hyperkalemia: Principal | ICD-10-CM

## 2018-07-02 DIAGNOSIS — E1121 Type 2 diabetes mellitus with diabetic nephropathy: Secondary | ICD-10-CM

## 2018-07-02 DIAGNOSIS — I10 Essential (primary) hypertension: Secondary | ICD-10-CM

## 2018-07-02 LAB — CBC
HEMATOCRIT: 33.6 % — AB (ref 39.0–52.0)
HEMOGLOBIN: 10.8 g/dL — AB (ref 13.0–17.0)
MCH: 30.5 pg (ref 26.0–34.0)
MCHC: 32.1 g/dL (ref 30.0–36.0)
MCV: 94.9 fL (ref 78.0–100.0)
Platelets: 259 10*3/uL (ref 150–400)
RBC: 3.54 MIL/uL — ABNORMAL LOW (ref 4.22–5.81)
RDW: 14.4 % (ref 11.5–15.5)
WBC: 8.4 10*3/uL (ref 4.0–10.5)

## 2018-07-02 LAB — RENAL FUNCTION PANEL
ALBUMIN: 3.1 g/dL — AB (ref 3.5–5.0)
ANION GAP: 16 — AB (ref 5–15)
BUN: 65 mg/dL — AB (ref 6–20)
CHLORIDE: 98 mmol/L (ref 98–111)
CO2: 23 mmol/L (ref 22–32)
Calcium: 8.4 mg/dL — ABNORMAL LOW (ref 8.9–10.3)
Creatinine, Ser: 11.77 mg/dL — ABNORMAL HIGH (ref 0.61–1.24)
GFR, EST AFRICAN AMERICAN: 5 mL/min — AB (ref >60)
GFR, EST NON AFRICAN AMERICAN: 4 mL/min — AB (ref >60)
Glucose, Bld: 110 mg/dL — ABNORMAL HIGH (ref 70–99)
PHOSPHORUS: 8.6 mg/dL — AB (ref 2.5–4.6)
POTASSIUM: 4.4 mmol/L (ref 3.5–5.1)
Sodium: 137 mmol/L (ref 135–145)

## 2018-07-02 LAB — GLUCOSE, CAPILLARY
GLUCOSE-CAPILLARY: 78 mg/dL (ref 70–99)
Glucose-Capillary: 396 mg/dL — ABNORMAL HIGH (ref 70–99)
Glucose-Capillary: 55 mg/dL — ABNORMAL LOW (ref 70–99)

## 2018-07-02 MED ORDER — CAMPHOR-MENTHOL 0.5-0.5 % EX LOTN: 1.0000 "application " | TOPICAL_LOTION | Freq: Three times a day (TID) | CUTANEOUS | 0 refills | Status: DC | PRN

## 2018-07-02 MED ORDER — DOXERCALCIFEROL 4 MCG/2ML IV SOLN
INTRAVENOUS | Status: AC
  Filled 2018-07-02: qty 2

## 2018-07-02 MED ORDER — POLYETHYLENE GLYCOL 3350 17 G PO PACK: 17.0000 g | PACK | Freq: Every day | ORAL | 0 refills | Status: DC

## 2018-07-02 MED ORDER — ONDANSETRON HCL 4 MG PO TABS: 4.0000 mg | ORAL_TABLET | Freq: Four times a day (QID) | ORAL | 0 refills | Status: DC | PRN

## 2018-07-02 MED ORDER — MUPIROCIN 2 % EX OINT: 1.0000 "application " | TOPICAL_OINTMENT | Freq: Two times a day (BID) | CUTANEOUS | 0 refills | Status: DC

## 2018-07-02 MED ORDER — FERRIC CITRATE 1 GM 210 MG(FE) PO TABS: 210.0000 mg | ORAL_TABLET | Freq: Three times a day (TID) | ORAL | 0 refills | Status: DC

## 2018-07-02 MED ORDER — NICOTINE 21 MG/24HR TD PT24: 21.0000 mg | MEDICATED_PATCH | Freq: Every day | TRANSDERMAL | 0 refills | Status: DC

## 2018-07-02 NOTE — Discharge Summary (Signed)
Discharge Summary  Fernando Jones VCB:[id number] DOB: June 04, 1961  PCP: Patient, No Pcp Per  Admit date: 06/29/2018 Discharge date: 07/02/2018  Time spent: 25 minutes  Recommendations for Outpatient Follow-up:  1. Continue with hemodialysis as planned 2. Follow-up with your PCP 3. Follow-up with vascular surgery 4. Follow with nephrology 5. Take your medications as prescribed  Discharge Diagnoses:  Active Hospital Problems   Diagnosis Date Noted  . Hyperkalemia 06/29/2018  . ESRD (end stage renal disease) ([place]) 12/06/2017  . DM (diabetes mellitus), type 2 with renal complications ([place]) 38/46/6599  . TOBACCO ABUSE 05/20/2009  . Essential hypertension 05/20/2009    Resolved Hospital Problems  No resolved problems to display.    Discharge Condition: Stable   Diet recommendation: Renal diet with fluid restriction  Vitals:   07/02/18 0655 07/02/18 1024  BP: (!) 149/80 102/62  Pulse: 92 90  Resp: 16   Temp: 98 F (36.7 C) 99.3 F (37.4 C)  SpO2: 100% 96%    History of present illness:  Fernando Coombs Herrerais a 57 y.o.malewith medical history significant ofend-stage renal disease on hemodialysis Tuesday Thursday and Saturday, diabetes, hypertension, GERD as well as tobacco abuse who presented due to malfunctioning dialysis AV fistula.He wassupposed to get thrombectomy of the thrombosed AV fistula but then had hyperkalemia with potassium of 6.3 and significant pain so was admitted to have the procedure done in the hospital.  07/01/2018: Patient seen and examined at his bedside.  Persistent nausea and vomiting.  Added IV Phenergan for refractory nausea.  Will get hemodialysis in the hospital today and possible discharge tomorrow with improvement of nausea.  07/02/2018: Patient seen and examined at bedside.  His pain and nausea have resolved.  Has no new complaints.  Had hemodialysis on 07/01/2018 in the inpatient setting.  Hospital Course:  Principal Problem:  Hyperkalemia Active Problems:   DM (diabetes mellitus), type 2 with renal complications (HCC)   TOBACCO ABUSE   Essential hypertension   ESRD (end stage renal disease) (HCC)  Resolved intractable nausea and vomiting suspect secondary to uremia Resolved post hemodialysis on 07/01/2018  Resolved hyperkalemia in the setting of end-stage renal disease Resolved post hemodialysis on 07/01/2018  POD #2 status post thrombectomy and revision of right radiocephalic AV fistula Good thrill on palm feeling of fistula and good radial pulse.   Vascular surgery following.  Highly appreciated Pain management and bowel regimen in place  End-stage renal disease on hemodialysis Tuesday Thursday Saturday HD planned 07/01/2018 Continue hemodialysis outpatient at your scheduled days  Resolved hypertension Post hemodialysis on 07/01/2018.  MRSA suspect colonization Contact precaution No sign of active infective process    Procedures:  HD on 07/01/2018  Consultations:  Nephrology  Discharge Exam: BP 102/62 (BP Location: Left Arm)   Pulse 90   Temp 99.3 F (37.4 C) (Oral)   Resp 16   Ht 5\' 5"  (1.651 m)   Wt 70.9 kg (156 lb 4.9 oz)   SpO2 96%   BMI 26.01 kg/m  . General: 57 y.o. year-old male well developed well nourished in no acute distress.  Alert and oriented x3. . Cardiovascular: Regular rate and rhythm with no rubs or gallops.  No thyromegaly or JVD noted.   Marland Kitchen Respiratory: Clear to auscultation with no wheezes or rales. Good inspiratory effort. . Abdomen: Soft nontender nondistended with normal bowel sounds x4 quadrants. . Musculoskeletal: No lower extremity edema. 2/4 pulses in all 4 extremities. . Skin: No ulcerative lesions noted or rashes, . Psychiatry: Mood is  appropriate for condition and setting  Discharge Instructions You were cared for by a hospitalist during your hospital stay. If you have any questions about your discharge medications or the care you received while  you were in the hospital after you are discharged, you can call the unit and asked to speak with the hospitalist on call if the hospitalist that took care of you is not available. Once you are discharged, your primary care physician will handle any further medical issues. Please note that NO REFILLS for any discharge medications will be authorized once you are discharged, as it is imperative that you return to your primary care physician (or establish a relationship with a primary care physician if you do not have one) for your aftercare needs so that they can reassess your need for medications and monitor your lab values.  Discharge Instructions    Call MD for:  redness, tenderness, or signs of infection (pain, swelling, bleeding, redness, odor or green/yellow discharge around incision site)   Complete by:  As directed    Call MD for:  severe or increased pain, loss or decreased feeling  in affected limb(s)   Complete by:  As directed    Call MD for:  temperature >100.5   Complete by:  As directed    Lifting restrictions   Complete by:  As directed    No lifting for 2 weeks   Resume previous diet   Complete by:  As directed      Allergies as of 07/02/2018   No Known Allergies     Medication List    TAKE these medications   aspirin 81 MG EC tablet Take 1 tablet (81 mg total) by mouth daily. What changed:  Another medication with the same name was removed. Continue taking this medication, and follow the directions you see here.   camphor-menthol lotion Commonly known as:  SARNA Apply 1 application topically every 8 (eight) hours as needed for itching.   carvedilol 12.5 MG tablet Commonly known as:  COREG Take 1 tablet (12.5 mg total) by mouth 2 (two) times daily with a meal.   Darbepoetin Alfa 200 MCG/0.4ML Sosy injection Commonly known as:  ARANESP 262mcg to be given with dialysis every 14 days   feeding supplement (NEPRO CARB STEADY) Liqd Take 237 mLs by mouth 2 (two) times  daily between meals.   ferric citrate 1 GM 210 MG(Fe) tablet Commonly known as:  AURYXIA Take 1 tablet (210 mg total) by mouth 3 (three) times daily with meals.   hydrALAZINE 25 MG tablet Commonly known as:  APRESOLINE Take 1 tablet (25 mg total) by mouth every 8 (eight) hours.   losartan 50 MG tablet Commonly known as:  COZAAR Take 50 mg by mouth daily.   mupirocin ointment 2 % Commonly known as:  BACTROBAN Place 1 application into the nose 2 (two) times daily.   nicotine 21 mg/24hr patch Commonly known as:  NICODERM CQ - dosed in mg/24 hours Place 1 patch (21 mg total) onto the skin daily. Start taking on:  07/03/2018   ondansetron 4 MG tablet Commonly known as:  ZOFRAN Take 1 tablet (4 mg total) by mouth every 6 (six) hours as needed for nausea.   oxyCODONE 5 MG immediate release tablet Commonly known as:  ROXICODONE Take 1 tablet (5 mg total) by mouth every 4 (four) hours as needed for severe pain.   pantoprazole 40 MG tablet Commonly known as:  PROTONIX Take 1 tablet (40 mg total)  by mouth 2 (two) times daily before a meal.   polyethylene glycol packet Commonly known as:  MIRALAX / GLYCOLAX Take 17 g by mouth daily. Start taking on:  07/03/2018   ranitidine 150 MG tablet Commonly known as:  ZANTAC Take 150 mg by mouth daily.   senna-docusate 8.6-50 MG tablet Commonly known as:  Senokot-S Take 1 tablet by mouth 2 (two) times daily.   tamsulosin 0.4 MG Caps capsule Commonly known as:  FLOMAX Take 1 capsule (0.4 mg total) by mouth daily.      No Known Allergies Follow-up Information    [facility]. Call in 1 day(s).   Why:  Please call for an appointment. Contact information: [place] [address] [phone]       Roney Jaffe, MD. Call in 1 day(s).   Specialty:  Nephrology Why:  Please call for an appointment. Contact information: [place]  [address] [phone]        Angelia Mould, MD. Call in 1 day(s).   Specialties:  Vascular Surgery, Cardiology Why:  Please call for an appointment. Contact information: [address] [place] [address] [phone]            The results of significant diagnostics from this hospitalization (including imaging, microbiology, ancillary and laboratory) are listed below for reference.    Significant Diagnostic Studies: Dg Chest 2 View  Result Date: 06/30/2018 CLINICAL DATA:  Elective surgery. EXAM: CHEST - 2 VIEW COMPARISON:  12/09/2017 FINDINGS: The heart size and mediastinal contours are within normal limits. Both lungs are clear. The visualized skeletal structures are unremarkable. IMPRESSION: No active cardiopulmonary disease. Electronically Signed   By: Franchot Gallo M.D.   On: 06/30/2018 07:07   Ir Removal Tun Cv Cath W/o Fl  Result Date: 06/05/2018 INDICATION: Functional right forearm AV fistula, no longer in need of dialysis catheter. Tunneled dialysis catheter was placed by another service. EXAM: REMOVAL OF TUNNELED HEMODIALYSIS CATHETER MEDICATIONS: None COMPLICATIONS: None immediate. PROCEDURE: Informed written consent was obtained from the patient following an explanation of the procedure, risks, benefits and alternatives to treatment. A time out was performed prior to the initiation of the procedure. Maximal barrier sterile technique was utilized including caps, mask, sterile gowns, sterile gloves, large sterile drape, hand hygiene, and chlorhexidine. 1% lidocaine with epinephrine was injected under sterile conditions along the subcutaneous tunnel. Utilizing a combination of blunt dissection and gentle traction, the catheter was removed intact. Hemostasis was obtained with manual compression. A dressing was placed. The patient tolerated the procedure well without immediate post procedural complication. IMPRESSION: Successful removal of tunneled dialysis catheter.  Electronically Signed   By: Sandi Mariscal M.D.   On: 06/05/2018 09:24    Microbiology: Recent Results (from the past 240 hour(s))  Surgical pcr screen     Status: Abnormal   Collection Time: 06/29/18 11:35 PM  Result Value Ref Range Status   MRSA, PCR POSITIVE (A) NEGATIVE Final    Comment: RESULT CALLED TO, READ BACK BY AND VERIFIED WITH: Donnelly Angelica RN 06/30/18 0301 JDW    Staphylococcus aureus POSITIVE (A) NEGATIVE Final    Comment: (NOTE) The Xpert SA Assay (FDA approved for NASAL specimens in patients 52 years of age and older), is one component of a comprehensive surveillance program. It is not intended to diagnose infection nor to guide or monitor treatment. Performed at [facility], [place] [address], [place], [place] [address]  Labs: Basic Metabolic Panel: Recent Labs  Lab 06/29/18 1729 06/29/18 2218 06/30/18 0530 07/02/18 0430  NA 137  --  142 137  K 6.3*  --  4.8 4.4  CL 98  --  101 98  CO2 22  --  24 23  GLUCOSE 122*  --  91 110*  BUN 111*  --  121* 65*  CREATININE 14.22* 15.25* 15.03* 11.77*  CALCIUM 9.5  --  8.7* 8.4*  PHOS  --   --  7.3* 8.6*   Liver Function Tests: Recent Labs  Lab 06/29/18 1729 06/30/18 0530 07/02/18 0430  AST 16  --   --   ALT 23  --   --   ALKPHOS 67  --   --   BILITOT 0.7  --   --   PROT 7.2  --   --   ALBUMIN 3.6 3.1* 3.1*   No results for input(s): LIPASE, AMYLASE in the last 168 hours. No results for input(s): AMMONIA in the last 168 hours. CBC: Recent Labs  Lab 06/29/18 1729 06/29/18 2218 06/30/18 0530 07/02/18 0430  WBC 10.0 11.5* 10.1 8.4  NEUTROABS 6.8  --   --   --   HGB 12.0* 11.7* 10.4* 10.8*  HCT 38.5* 35.7* 32.5* 33.6*  MCV 97.0 95.5 95.0 94.9  PLT 279 268 250 259   Cardiac Enzymes: No results for input(s): CKTOTAL, CKMB, CKMBINDEX, TROPONINI in the last 168 hours. BNP: BNP (last 3 results) Recent Labs    12/06/17 1348  BNP 2,214.9*    ProBNP (last 3 results) No results for input(s):  PROBNP in the last 8760 hours.  CBG: Recent Labs  Lab 07/01/18 0747 07/01/18 1151 07/01/18 1721 07/01/18 2134 07/02/18 0840  GLUCAP 127* 110* 128* 150* 396*       Signed:  Kayleen Memos, MD [facility] 07/02/2018, 11:51 AM

## 2018-07-02 NOTE — Discharge Instructions (Signed)
° °  Hyperkalemia Hyperkalemia is when you have too much potassium in your blood. Potassium is normally removed (excreted) from your body by your kidneys. If there is too much potassium in your blood, it can affect how your heart works. Follow these instructions at home:  Take medicines only as told by your doctor.  Do not take any supplements, natural products, herbs, or vitamins unless your doctor says it is okay.  Limit your alcohol intake as told by your doctor.  Stop illegal drug use. If you need help quitting, ask your doctor.  Keep all follow-up visits as told by your doctor. This is important.  If you have kidney disease, you may need to follow a low potassium diet. A food specialist (dietitian) can help you. Contact a doctor if:  Your heartbeat is not regular or very slow.  You feel dizzy (light-headed).  You feel weak.  You feel sick to your stomach (nauseous).  You have tingling in your hands or feet.  You cannot feel your hands or feet. Get help right away if:  You are short of breath.  You have chest pain.  You pass out (faint).  You cannot move your muscles. This information is not intended to replace advice given to you by your health care provider. Make sure you discuss any questions you have with your health care provider. Document Released: 11/22/2005 Document Revised: 04/29/2016 Document Reviewed: 02/27/2014 Elsevier Interactive Patient Education  2018 [place] today. You may resume her normal activity tomorrow.  DIET: Resume your previous diet.  WOUND CARE: If you have a dressing, this can be removed in 48 hours. Otherwise, keep your incisions clean and dry. Elevate the affected limb on a pillow. You may shower starting 48 hours after your surgery.  SPECIAL INSTRUCTIONS: You will have mild to moderate discomfort at the incision and graft site. Call your doctor for: persistent or heavy bleeding at the surgical site, increasing  redness or swelling of the incision, a temperature greater than 101, severe pain or loss of feeling in the hand or foot on the side of surgery.  FOLLOW-UP: Call the office for any problems.  [facility]  OFFICE NUMBER: [phone]

## 2018-07-02 NOTE — Progress Notes (Addendum)
[facility] Progress Note   Subjective:  Feeling much better this am.  Pain controlled.  Ready to go home    Objective Vitals:   07/02/18 0530 07/02/18 0600 07/02/18 0630 07/02/18 0655  BP: (!) 150/72 (!) 154/76 (!) 154/93 (!) 149/80  Pulse: 96 96 96 92  Resp: 16 16 16 16   Temp:    98 F (36.7 C)  TempSrc:    Oral  SpO2:    100%  Weight:    70.9 kg (156 lb 4.9 oz)  Height:       Physical Exam General: WNWD male NAD Heart: RRR Lungs: CTAB  Abdomen: soft NT/ND Extremities: No LE edema Dialysis Access: RUE AVF +bruit   Dialysis Orders:  AF TTS 400/800 EDW 71.5kg 2K/2.25Ca Na Linear Profile 4 R AVF Heparin bolus 2000 U  -Hectorol 47mcg TIW -Venofer 50mg  IV q week   Assessment/Plan: 1. Clotted AVF -s/p thrombectomy/revision of AVF Dr. Scot Dock 7/26 2. Hyperkalemia - resolved  3. ESRD. TTS. Continue on schedule. Next HD 7/30 at outpatient center.   4. Hypertension/volume  - BP slightly elevated -labile on HD.  Usually comes down with HD. Post HD wt on 7/27 - 70.9kg  5. Anemia  - Hgb 10.8. Reports "black" stools for months but is on iron based binder Lorin Picket)  which can have this side effect. Follow trends  6. Metabolic bone disease -  Ca ok. Phos elevated - Continue Hectorol/binders - Auryxia discussed. May consider another binder if trouble tolerating --can f/u at outpatient center.  7. Nutrition - Renal diet/vitamins/prostat  Dispo-  Ok for discharge   Lynnda Child PA-C [facility] Pager [phone] 07/02/2018,9:40 AM  LOS: 3 days   Pt seen, examined and agree w A/P as above.  [name] MD [facility] pager [phone]   07/02/2018, 11:14 AM      s   Additional Objective Labs: Basic Metabolic Panel: Recent Labs  Lab 06/29/18 1729 06/29/18 2218 06/30/18 0530 07/02/18 0430  NA 137  --  142 137  K 6.3*  --  4.8 4.4  CL 98  --  101 98  CO2 22  --  24 23  GLUCOSE 122*  --  91 110*  BUN 111*   --  121* 65*  CREATININE 14.22* 15.25* 15.03* 11.77*  CALCIUM 9.5  --  8.7* 8.4*  PHOS  --   --  7.3* 8.6*   CBC: Recent Labs  Lab 06/29/18 1729 06/29/18 2218 06/30/18 0530 07/02/18 0430  WBC 10.0 11.5* 10.1 8.4  NEUTROABS 6.8  --   --   --   HGB 12.0* 11.7* 10.4* 10.8*  HCT 38.5* 35.7* 32.5* 33.6*  MCV 97.0 95.5 95.0 94.9  PLT 279 268 250 259   Blood Culture No results found for: SDES, SPECREQUEST, CULT, REPTSTATUS  Cardiac Enzymes: No results for input(s): CKTOTAL, CKMB, CKMBINDEX, TROPONINI in the last 168 hours. CBG: Recent Labs  Lab 07/01/18 0747 07/01/18 1151 07/01/18 1721 07/01/18 2134 07/02/18 0840  GLUCAP 127* 110* 128* 150* 396*   Iron Studies: No results for input(s): IRON, TIBC, TRANSFERRIN, FERRITIN in the last 72 hours. Lab Results  Component Value Date   INR 1.25 12/07/2017   INR 1.0 05/12/2009   INR 1.0 05/09/2009   Medications: . sodium chloride    . sodium chloride 10 mL/hr at 06/30/18 1108  . sodium chloride    . sodium chloride     . carvedilol  12.5 mg Oral BID  WC  . Chlorhexidine Gluconate Cloth  6 each Topical Q0600  . doxercalciferol  4 mcg Intravenous Q T,Th,Sa-HD  . famotidine  20 mg Oral BID  . ferric citrate  210 mg Oral TID WC  . heparin  5,000 Units Subcutaneous Q8H  . insulin aspart  0-9 Units Subcutaneous TID WC  . losartan  50 mg Oral Daily  . midazolam  1 mg Intravenous Once  . mupirocin ointment  1 application Nasal BID  . nicotine  21 mg Transdermal Daily  . polyethylene glycol  17 g Oral Daily  . senna-docusate  1 tablet Oral BID  . sodium chloride flush  3 mL Intravenous Q12H  . sodium polystyrene  30 g Oral Once  . tamsulosin  0.4 mg Oral Daily

## 2018-07-02 NOTE — Progress Notes (Signed)
Pts CBG 55, gave juice and snack. Repeat CBG was 78 and pt about to eat meal. Will reassess.

## 2018-07-02 NOTE — Progress Notes (Signed)
   VASCULAR SURGERY ASSESSMENT & PLAN:   2 Days Post-Op s/p: Thrombectomy and revision of right forearm fistula.  His pain is much better.  Vascular surgery will be available as needed.  SUBJECTIVE:   His pain is much improved.  PHYSICAL EXAM:   Vitals:   07/02/18 0530 07/02/18 0600 07/02/18 0630 07/02/18 0655  BP: (!) 150/72 (!) 154/76 (!) 154/93 (!) 149/80  Pulse: 96 96 96 92  Resp: 16 16 16 16   Temp:    98 F (36.7 C)  TempSrc:    Oral  SpO2:    100%  Weight:    156 lb 4.9 oz (70.9 kg)  Height:       Good thrill in right radiocephalic AV fistula. Palpable right radial pulse. His incision looks fine.  LABS:   Lab Results  Component Value Date   WBC 8.4 07/02/2018   HGB 10.8 (L) 07/02/2018   HCT 33.6 (L) 07/02/2018   MCV 94.9 07/02/2018   PLT 259 07/02/2018   CBG (last 3)  Recent Labs    07/01/18 1151 07/01/18 1721 07/01/18 2134  GLUCAP 110* 128* 150*    PROBLEM LIST:    Principal Problem:   Hyperkalemia Active Problems:   DM (diabetes mellitus), type 2 with renal complications (HCC)   TOBACCO ABUSE   Essential hypertension   ESRD (end stage renal disease) (HCC)   CURRENT MEDS:   . carvedilol  12.5 mg Oral BID WC  . Chlorhexidine Gluconate Cloth  6 each Topical Q0600  . doxercalciferol  4 mcg Intravenous Q T,Th,Sa-HD  . famotidine  20 mg Oral BID  . ferric citrate  210 mg Oral TID WC  . heparin  5,000 Units Subcutaneous Q8H  . insulin aspart  0-9 Units Subcutaneous TID WC  . losartan  50 mg Oral Daily  . midazolam  1 mg Intravenous Once  . mupirocin ointment  1 application Nasal BID  . nicotine  21 mg Transdermal Daily  . polyethylene glycol  17 g Oral Daily  . senna-docusate  1 tablet Oral BID  . sodium chloride flush  3 mL Intravenous Q12H  . sodium polystyrene  30 g Oral Once  . tamsulosin  0.4 mg Oral Daily    Deitra Mayo Beeper: [phone] Office: [phone] 07/02/2018

## 2018-07-03 ENCOUNTER — Encounter (HOSPITAL_COMMUNITY): Payer: Self-pay | Admitting: Vascular Surgery

## 2018-07-03 ENCOUNTER — Telehealth: Payer: Self-pay | Admitting: Vascular Surgery

## 2018-07-03 LAB — HEPATITIS B SURFACE ANTIGEN: Hepatitis B Surface Ag: NEGATIVE

## 2018-07-03 NOTE — Telephone Encounter (Signed)
sch appt phone NA mld ltr 07/19/18 130pm p/o PA

## 2018-07-03 NOTE — Anesthesia Postprocedure Evaluation (Signed)
Anesthesia Post Note  Patient: Fernando Jones  Procedure(s) Performed: THROMBECTOMY and revision ARTERIOVENOUS FISTULA right RADIOCEPHALIC (Right Arm Lower)     Patient location during evaluation: PACU Anesthesia Type: General Level of consciousness: awake and alert Pain management: pain level controlled Vital Signs Assessment: post-procedure vital signs reviewed and stable Respiratory status: spontaneous breathing, nonlabored ventilation, respiratory function stable and patient connected to nasal cannula oxygen Cardiovascular status: blood pressure returned to baseline and stable Postop Assessment: no apparent nausea or vomiting Anesthetic complications: no    Last Vitals:  Vitals:   07/02/18 0655 07/02/18 1024  BP: (!) 149/80 102/62  Pulse: 92 90  Resp: 16   Temp: 36.7 C 37.4 C  SpO2: 100% 96%    Last Pain:  Vitals:   07/02/18 1024  TempSrc: Oral  PainSc:                  Hunt S

## 2018-07-04 NOTE — Addendum Note (Signed)
Addendum  created 07/04/18 1058 by Albertha Ghee, MD   Intraprocedure Staff edited

## 2018-07-19 ENCOUNTER — Encounter: Payer: Self-pay | Admitting: Vascular Surgery

## 2018-07-19 NOTE — Progress Notes (Deleted)
POST OPERATIVE OFFICE NOTE    CC:  F/u for surgery  HPI:  This is a 57 y.o. male who was seen on 06/30/2018 secondary to thrombosed Right RC AVF.  He has had continued pain in the right UE since surgery and comes in today for evaluation and recommendations.   s/p Thrombectomy and revision of right radiocephalic AV fistula by Dr. Scot Dock on 06/30/2018.  AV fistula was created originally on 12/09/2017 by Dr. Donnetta Hutching.  He has not had any other accesses prior.    He had sever pain post operatively.  On exam he had a palpable radial pulse, doppler palmer signal and good thrill in the fistula.  His pain decreased with time and he was discharged from the hospital post op day 2.    No Known Allergies  Current Outpatient Medications  Medication Sig Dispense Refill  . aspirin EC 81 MG EC tablet Take 1 tablet (81 mg total) by mouth daily. (Patient not taking: Reported on 06/29/2018) 30 tablet 1  . camphor-menthol (SARNA) lotion Apply 1 application topically every 8 (eight) hours as needed for itching. 222 mL 0  . carvedilol (COREG) 12.5 MG tablet Take 1 tablet (12.5 mg total) by mouth 2 (two) times daily with a meal. 60 tablet 1  . Darbepoetin Alfa (ARANESP) 200 MCG/0.4ML SOSY injection 286mcg to be given with dialysis every 14 days 1.68 mL 0  . ferric citrate (AURYXIA) 1 GM 210 MG(Fe) tablet Take 1 tablet (210 mg total) by mouth 3 (three) times daily with meals. 270 tablet 0  . hydrALAZINE (APRESOLINE) 25 MG tablet Take 1 tablet (25 mg total) by mouth every 8 (eight) hours. (Patient not taking: Reported on 06/29/2018) 90 tablet 0  . losartan (COZAAR) 50 MG tablet Take 50 mg by mouth daily.  3  . mupirocin ointment (BACTROBAN) 2 % Place 1 application into the nose 2 (two) times daily. 22 g 0  . nicotine (NICODERM CQ - DOSED IN MG/24 HOURS) 21 mg/24hr patch Place 1 patch (21 mg total) onto the skin daily. 28 patch 0  . Nutritional Supplements (FEEDING SUPPLEMENT, NEPRO CARB STEADY,) LIQD Take 237 mLs by mouth 2  (two) times daily between meals. (Patient not taking: Reported on 06/29/2018) 60 Can 1  . ondansetron (ZOFRAN) 4 MG tablet Take 1 tablet (4 mg total) by mouth every 6 (six) hours as needed for nausea. 20 tablet 0  . oxyCODONE (ROXICODONE) 5 MG immediate release tablet Take 1 tablet (5 mg total) by mouth every 4 (four) hours as needed for severe pain. 12 tablet 0  . pantoprazole (PROTONIX) 40 MG tablet Take 1 tablet (40 mg total) by mouth 2 (two) times daily before a meal. (Patient not taking: Reported on 06/29/2018) 60 tablet 0  . polyethylene glycol (MIRALAX / GLYCOLAX) packet Take 17 g by mouth daily. 14 each 0  . ranitidine (ZANTAC) 150 MG tablet Take 150 mg by mouth daily.  3  . senna-docusate (SENOKOT-S) 8.6-50 MG tablet Take 1 tablet by mouth 2 (two) times daily. (Patient not taking: Reported on 06/29/2018) 60 tablet 0  . tamsulosin (FLOMAX) 0.4 MG CAPS capsule Take 1 capsule (0.4 mg total) by mouth daily. 30 capsule 0   No current facility-administered medications for this visit.      ROS:  See HPI  Physical Exam:  There were no vitals filed for this visit.  Incision:  *** Extremities:  *** Neuro: *** Abdomen:  ***  Assessment/Plan:  This is a 57 y.o. male  who is s/p: ***  -***   Leontine Locket, PA-C [facility] [phone]  Clinic MD:  ***

## 2018-08-17 ENCOUNTER — Other Ambulatory Visit: Payer: Self-pay

## 2018-08-17 DIAGNOSIS — N186 End stage renal disease: Secondary | ICD-10-CM

## 2018-09-22 ENCOUNTER — Encounter (HOSPITAL_COMMUNITY): Payer: Self-pay

## 2018-09-22 ENCOUNTER — Ambulatory Visit: Payer: Self-pay | Admitting: Vascular Surgery

## 2018-09-22 ENCOUNTER — Other Ambulatory Visit (HOSPITAL_COMMUNITY): Payer: Self-pay

## 2018-10-14 ENCOUNTER — Encounter (HOSPITAL_COMMUNITY): Payer: Self-pay | Admitting: Emergency Medicine

## 2018-10-14 ENCOUNTER — Emergency Department (HOSPITAL_COMMUNITY): Payer: Self-pay

## 2018-10-14 ENCOUNTER — Inpatient Hospital Stay (HOSPITAL_COMMUNITY)
Admission: EM | Admit: 2018-10-14 | Discharge: 2018-10-25 | DRG: 286 | Disposition: A | Discharge status: Home / Self Care | Payer: Self-pay | Attending: Internal Medicine | Admitting: Internal Medicine

## 2018-10-14 ENCOUNTER — Other Ambulatory Visit: Payer: Self-pay

## 2018-10-14 DIAGNOSIS — R197 Diarrhea, unspecified: Secondary | ICD-10-CM | POA: Diagnosis present

## 2018-10-14 DIAGNOSIS — D72829 Elevated white blood cell count, unspecified: Secondary | ICD-10-CM | POA: Diagnosis present

## 2018-10-14 DIAGNOSIS — R011 Cardiac murmur, unspecified: Secondary | ICD-10-CM | POA: Diagnosis present

## 2018-10-14 DIAGNOSIS — K219 Gastro-esophageal reflux disease without esophagitis: Secondary | ICD-10-CM | POA: Diagnosis present

## 2018-10-14 DIAGNOSIS — R0789 Other chest pain: Principal | ICD-10-CM | POA: Diagnosis present

## 2018-10-14 DIAGNOSIS — N186 End stage renal disease: Secondary | ICD-10-CM | POA: Diagnosis present

## 2018-10-14 DIAGNOSIS — R911 Solitary pulmonary nodule: Secondary | ICD-10-CM | POA: Diagnosis present

## 2018-10-14 DIAGNOSIS — R7989 Other specified abnormal findings of blood chemistry: Secondary | ICD-10-CM

## 2018-10-14 DIAGNOSIS — R1011 Right upper quadrant pain: Secondary | ICD-10-CM

## 2018-10-14 DIAGNOSIS — I2510 Atherosclerotic heart disease of native coronary artery without angina pectoris: Secondary | ICD-10-CM | POA: Diagnosis present

## 2018-10-14 DIAGNOSIS — R109 Unspecified abdominal pain: Secondary | ICD-10-CM

## 2018-10-14 DIAGNOSIS — R079 Chest pain, unspecified: Secondary | ICD-10-CM | POA: Diagnosis present

## 2018-10-14 DIAGNOSIS — I700 Atherosclerosis of aorta: Secondary | ICD-10-CM | POA: Diagnosis present

## 2018-10-14 DIAGNOSIS — E11649 Type 2 diabetes mellitus with hypoglycemia without coma: Secondary | ICD-10-CM | POA: Diagnosis not present

## 2018-10-14 DIAGNOSIS — I10 Essential (primary) hypertension: Secondary | ICD-10-CM | POA: Diagnosis present

## 2018-10-14 DIAGNOSIS — I779 Disorder of arteries and arterioles, unspecified: Secondary | ICD-10-CM

## 2018-10-14 DIAGNOSIS — R778 Other specified abnormalities of plasma proteins: Secondary | ICD-10-CM

## 2018-10-14 DIAGNOSIS — Z87442 Personal history of urinary calculi: Secondary | ICD-10-CM

## 2018-10-14 DIAGNOSIS — Z8249 Family history of ischemic heart disease and other diseases of the circulatory system: Secondary | ICD-10-CM

## 2018-10-14 DIAGNOSIS — N529 Male erectile dysfunction, unspecified: Secondary | ICD-10-CM | POA: Diagnosis present

## 2018-10-14 DIAGNOSIS — F17200 Nicotine dependence, unspecified, uncomplicated: Secondary | ICD-10-CM | POA: Diagnosis present

## 2018-10-14 DIAGNOSIS — R1915 Other abnormal bowel sounds: Secondary | ICD-10-CM

## 2018-10-14 DIAGNOSIS — Z992 Dependence on renal dialysis: Secondary | ICD-10-CM

## 2018-10-14 DIAGNOSIS — E8889 Other specified metabolic disorders: Secondary | ICD-10-CM | POA: Diagnosis present

## 2018-10-14 DIAGNOSIS — E1129 Type 2 diabetes mellitus with other diabetic kidney complication: Secondary | ICD-10-CM | POA: Diagnosis present

## 2018-10-14 DIAGNOSIS — R749 Abnormal serum enzyme level, unspecified: Secondary | ICD-10-CM

## 2018-10-14 DIAGNOSIS — Z87891 Personal history of nicotine dependence: Secondary | ICD-10-CM

## 2018-10-14 DIAGNOSIS — E1121 Type 2 diabetes mellitus with diabetic nephropathy: Secondary | ICD-10-CM

## 2018-10-14 DIAGNOSIS — Z7982 Long term (current) use of aspirin: Secondary | ICD-10-CM

## 2018-10-14 DIAGNOSIS — I313 Pericardial effusion (noninflammatory): Secondary | ICD-10-CM | POA: Diagnosis present

## 2018-10-14 DIAGNOSIS — E785 Hyperlipidemia, unspecified: Secondary | ICD-10-CM | POA: Diagnosis present

## 2018-10-14 DIAGNOSIS — N2581 Secondary hyperparathyroidism of renal origin: Secondary | ICD-10-CM | POA: Diagnosis present

## 2018-10-14 DIAGNOSIS — K3184 Gastroparesis: Secondary | ICD-10-CM | POA: Diagnosis present

## 2018-10-14 DIAGNOSIS — Z79899 Other long term (current) drug therapy: Secondary | ICD-10-CM

## 2018-10-14 DIAGNOSIS — D631 Anemia in chronic kidney disease: Secondary | ICD-10-CM | POA: Diagnosis present

## 2018-10-14 DIAGNOSIS — K5900 Constipation, unspecified: Secondary | ICD-10-CM | POA: Diagnosis present

## 2018-10-14 DIAGNOSIS — E1143 Type 2 diabetes mellitus with diabetic autonomic (poly)neuropathy: Secondary | ICD-10-CM | POA: Diagnosis present

## 2018-10-14 DIAGNOSIS — F329 Major depressive disorder, single episode, unspecified: Secondary | ICD-10-CM | POA: Diagnosis present

## 2018-10-14 DIAGNOSIS — I120 Hypertensive chronic kidney disease with stage 5 chronic kidney disease or end stage renal disease: Secondary | ICD-10-CM | POA: Diagnosis present

## 2018-10-14 DIAGNOSIS — E1122 Type 2 diabetes mellitus with diabetic chronic kidney disease: Secondary | ICD-10-CM | POA: Diagnosis present

## 2018-10-14 LAB — CBC WITH DIFFERENTIAL/PLATELET
Abs Immature Granulocytes: 0.1 10*3/uL — ABNORMAL HIGH (ref 0.00–0.07)
Abs Immature Granulocytes: 0.25 10*3/uL — ABNORMAL HIGH (ref 0.00–0.07)
BASOS ABS: 0 10*3/uL (ref 0.0–0.1)
BASOS PCT: 0 %
BASOS PCT: 0 %
Basophils Absolute: 0.1 10*3/uL (ref 0.0–0.1)
EOS ABS: 0 10*3/uL (ref 0.0–0.5)
EOS PCT: 1 %
EOS PCT: 0 %
Eosinophils Absolute: 0.1 10*3/uL (ref 0.0–0.5)
HCT: 13.1 % — ABNORMAL LOW (ref 39.0–52.0)
HEMATOCRIT: 40.2 % (ref 39.0–52.0)
HEMOGLOBIN: 12.7 g/dL — AB (ref 13.0–17.0)
Hemoglobin: 3.8 g/dL — CL (ref 13.0–17.0)
IMMATURE GRANULOCYTES: 2 %
Immature Granulocytes: 2 %
LYMPHS ABS: 0.7 10*3/uL (ref 0.7–4.0)
LYMPHS PCT: 4 %
Lymphocytes Relative: 5 %
Lymphs Abs: 0.2 10*3/uL — ABNORMAL LOW (ref 0.7–4.0)
MCH: 30.2 pg (ref 26.0–34.0)
MCH: 29.8 pg (ref 26.0–34.0)
MCHC: 29 g/dL — AB (ref 30.0–36.0)
MCHC: 31.6 g/dL (ref 30.0–36.0)
MCV: 104 fL — ABNORMAL HIGH (ref 80.0–100.0)
MCV: 94.4 fL (ref 80.0–100.0)
MONO ABS: 0.3 10*3/uL (ref 0.1–1.0)
Monocytes Absolute: 0.7 10*3/uL (ref 0.1–1.0)
Monocytes Relative: 5 %
Monocytes Relative: 5 %
NEUTROS PCT: 88 %
NRBC: 0 % (ref 0.0–0.2)
Neutro Abs: 4.3 10*3/uL (ref 1.7–7.7)
Neutro Abs: 13.7 10*3/uL — ABNORMAL HIGH (ref 1.7–7.7)
Neutrophils Relative %: 88 %
PLATELETS: 86 10*3/uL — AB (ref 150–400)
Platelets: 290 10*3/uL (ref 150–400)
RBC: 1.26 MIL/uL — AB (ref 4.22–5.81)
RBC: 4.26 MIL/uL (ref 4.22–5.81)
RDW: 15.9 % — ABNORMAL HIGH (ref 11.5–15.5)
RDW: 16 % — AB (ref 11.5–15.5)
WBC: 4.9 10*3/uL (ref 4.0–10.5)
WBC: 15.5 10*3/uL — ABNORMAL HIGH (ref 4.0–10.5)
nRBC: 0 % (ref 0.0–0.2)

## 2018-10-14 LAB — I-STAT TROPONIN, ED
Troponin i, poc: 0.1 ng/mL (ref 0.00–0.08)
Troponin i, poc: 0.1 ng/mL (ref 0.00–0.08)

## 2018-10-14 LAB — I-STAT CHEM 8, ED
BUN: 24 mg/dL — AB (ref 6–20)
BUN: 27 mg/dL — ABNORMAL HIGH (ref 6–20)
CALCIUM ION: 0.95 mmol/L — AB (ref 1.15–1.40)
CHLORIDE: 95 mmol/L — AB (ref 98–111)
Calcium, Ion: 1.02 mmol/L — ABNORMAL LOW (ref 1.15–1.40)
Chloride: 98 mmol/L (ref 98–111)
Creatinine, Ser: 5.8 mg/dL — ABNORMAL HIGH (ref 0.61–1.24)
Creatinine, Ser: 6.2 mg/dL — ABNORMAL HIGH (ref 0.61–1.24)
Glucose, Bld: 203 mg/dL — ABNORMAL HIGH (ref 70–99)
Glucose, Bld: 133 mg/dL — ABNORMAL HIGH (ref 70–99)
HEMATOCRIT: 47 % (ref 39.0–52.0)
HEMATOCRIT: 43 % (ref 39.0–52.0)
HEMOGLOBIN: 14.6 g/dL (ref 13.0–17.0)
Hemoglobin: 16 g/dL (ref 13.0–17.0)
POTASSIUM: 3.8 mmol/L (ref 3.5–5.1)
Potassium: 4.5 mmol/L (ref 3.5–5.1)
SODIUM: 135 mmol/L (ref 135–145)
SODIUM: 137 mmol/L (ref 135–145)
TCO2: 28 mmol/L (ref 22–32)
TCO2: 33 mmol/L — ABNORMAL HIGH (ref 22–32)

## 2018-10-14 LAB — CBG MONITORING, ED: GLUCOSE-CAPILLARY: 84 mg/dL (ref 70–99)

## 2018-10-14 LAB — BASIC METABOLIC PANEL
ANION GAP: 14 (ref 5–15)
BUN: 25 mg/dL — ABNORMAL HIGH (ref 6–20)
CALCIUM: 9.2 mg/dL (ref 8.9–10.3)
CHLORIDE: 94 mmol/L — AB (ref 98–111)
CO2: 28 mmol/L (ref 22–32)
Creatinine, Ser: 6.13 mg/dL — ABNORMAL HIGH (ref 0.61–1.24)
GFR calc non Af Amer: 9 mL/min — ABNORMAL LOW (ref >60)
GFR, EST AFRICAN AMERICAN: 10 mL/min — AB (ref >60)
GLUCOSE: 138 mg/dL — AB (ref 70–99)
POTASSIUM: 3.7 mmol/L (ref 3.5–5.1)
Sodium: 136 mmol/L (ref 135–145)

## 2018-10-14 LAB — COMPREHENSIVE METABOLIC PANEL
ALBUMIN: 3.7 g/dL (ref 3.5–5.0)
ALK PHOS: 57 U/L (ref 38–126)
ALT: 27 U/L (ref 0–44)
AST: 20 U/L (ref 15–41)
Anion gap: 14 (ref 5–15)
BUN: 25 mg/dL — ABNORMAL HIGH (ref 6–20)
CALCIUM: 9.4 mg/dL (ref 8.9–10.3)
CHLORIDE: 94 mmol/L — AB (ref 98–111)
CO2: 30 mmol/L (ref 22–32)
CREATININE: 6.2 mg/dL — AB (ref 0.61–1.24)
GFR calc non Af Amer: 9 mL/min — ABNORMAL LOW (ref >60)
GFR, EST AFRICAN AMERICAN: 10 mL/min — AB (ref >60)
GLUCOSE: 120 mg/dL — AB (ref 70–99)
Potassium: 4 mmol/L (ref 3.5–5.1)
SODIUM: 138 mmol/L (ref 135–145)
Total Bilirubin: 0.8 mg/dL (ref 0.3–1.2)
Total Protein: 7.8 g/dL (ref 6.5–8.1)

## 2018-10-14 LAB — TROPONIN I: TROPONIN I: 0.15 ng/mL — AB (ref <0.03)

## 2018-10-14 LAB — LIPASE, BLOOD: Lipase: 29 U/L (ref 11–51)

## 2018-10-14 MED ORDER — INSULIN ASPART 100 UNIT/ML ~~LOC~~ SOLN
0.0000 [IU] | SUBCUTANEOUS | Status: DC
  Administered 2018-10-15: 5 [IU] via SUBCUTANEOUS
  Administered 2018-10-15: 2 [IU] via SUBCUTANEOUS

## 2018-10-14 MED ORDER — MIDODRINE HCL 5 MG PO TABS
10.0000 mg | ORAL_TABLET | ORAL | Status: DC
  Administered 2018-10-17 – 2018-10-24 (×5): 10 mg via ORAL
  Filled 2018-10-14 (×3): qty 2

## 2018-10-14 MED ORDER — PROMETHAZINE HCL 25 MG/ML IJ SOLN
25.0000 mg | Freq: Once | INTRAMUSCULAR | Status: AC
  Administered 2018-10-14: 25 mg via INTRAVENOUS
  Filled 2018-10-14: qty 1

## 2018-10-14 MED ORDER — ONDANSETRON HCL 4 MG/2ML IJ SOLN
4.0000 mg | Freq: Four times a day (QID) | INTRAMUSCULAR | Status: DC | PRN
  Administered 2018-10-20 – 2018-10-24 (×2): 4 mg via INTRAVENOUS
  Filled 2018-10-14 (×2): qty 2

## 2018-10-14 MED ORDER — ACETAMINOPHEN 325 MG PO TABS: 650.0000 mg | ORAL_TABLET | ORAL | Status: DC | PRN

## 2018-10-14 MED ORDER — HEPARIN SODIUM (PORCINE) 5000 UNIT/ML IJ SOLN: 5000.0000 [IU] | Freq: Three times a day (TID) | INTRAMUSCULAR | Status: DC

## 2018-10-14 MED ORDER — MORPHINE SULFATE (PF) 2 MG/ML IV SOLN
2.0000 mg | INTRAVENOUS | Status: DC | PRN
  Administered 2018-10-15: 2 mg via INTRAVENOUS
  Filled 2018-10-14: qty 1

## 2018-10-14 MED ORDER — ROPINIROLE HCL 0.25 MG PO TABS
0.2500 mg | ORAL_TABLET | Freq: Every day | ORAL | Status: DC
  Administered 2018-10-15 – 2018-10-22 (×9): 0.5 mg via ORAL
  Filled 2018-10-14 (×10): qty 2

## 2018-10-14 MED ORDER — FENTANYL CITRATE (PF) 100 MCG/2ML IJ SOLN
25.0000 ug | Freq: Once | INTRAMUSCULAR | Status: AC
  Administered 2018-10-14: 25 ug via INTRAVENOUS
  Filled 2018-10-14: qty 2

## 2018-10-14 MED ORDER — SODIUM CHLORIDE 0.9 % IV BOLUS
500.0000 mL | Freq: Once | INTRAVENOUS | Status: AC
  Administered 2018-10-14: 500 mL via INTRAVENOUS

## 2018-10-14 MED ORDER — MORPHINE SULFATE (PF) 4 MG/ML IV SOLN: 4.0000 mg | Freq: Once | INTRAVENOUS | Status: DC

## 2018-10-14 MED ORDER — ASPIRIN EC 81 MG PO TBEC
81.0000 mg | DELAYED_RELEASE_TABLET | Freq: Every day | ORAL | Status: DC
  Administered 2018-10-15 – 2018-10-25 (×10): 81 mg via ORAL
  Filled 2018-10-14 (×10): qty 1

## 2018-10-14 MED ORDER — OXYCODONE HCL 5 MG PO TABS
5.0000 mg | ORAL_TABLET | ORAL | Status: DC | PRN
  Administered 2018-10-15 – 2018-10-25 (×31): 5 mg via ORAL
  Filled 2018-10-14 (×31): qty 1

## 2018-10-14 MED ORDER — PANTOPRAZOLE SODIUM 40 MG PO TBEC
40.0000 mg | DELAYED_RELEASE_TABLET | Freq: Two times a day (BID) | ORAL | Status: DC
  Administered 2018-10-15 – 2018-10-25 (×19): 40 mg via ORAL
  Filled 2018-10-14 (×20): qty 1

## 2018-10-14 NOTE — ED Notes (Signed)
Pt advised that a urine sample is needed; pt states that he does not make urine

## 2018-10-14 NOTE — ED Notes (Signed)
Admitting provider bedside 

## 2018-10-14 NOTE — ED Notes (Signed)
Dr Venora Maples informed of troponin results .10

## 2018-10-14 NOTE — H&P (Signed)
Fernando Jones [id number]:[id number] DOB: 08-01-1961 DOA: 10/14/2018     PCP: Patient, No Pcp Per   Outpatient Specialists:     NEphrology:    Dr. Clover Mealy    Patient arrived to ER on 10/14/18 at 1732  Patient coming from: home Lives alone,    Chief Complaint:  Chief Complaint  Patient presents with  . Chest Pain    HPI: Fernando Jones is a 57 y.o. male with medical history significant of ESRD on HD (T/Th/Sat), diabetes, HTN, GERD, and tobacco abuse, HLD, DM 2    Presented with   Left sided CP and total body pains. Pain in the left side of the abdomen chest and radiating around to the back of his neck. This occurred while driving home from dialysis. Pain is reproducible by palpation. His legs are hurting too he have had something similar when his fistula was placed. He had HD today and then developed generalized pain and burning in his chest. Denies anything stressful events. He does not smoke or drink.  NO fever no chills   Nitro dropped BP to 90   Regarding pertinent Chronic problems: He is diabetic but have not needed any DM  meds since starting HD.   Reports have been on HD for the past 24 m While in ER: Trop 0.1-0.1 The following Work up has been ordered so far: Continues to have persistent Chest discomfort,.  Orders Placed This Encounter  Procedures  . DG Chest 2 View  . CBC with Differential/Platelet  . CBC with Differential/Platelet  . Basic metabolic panel  . Comprehensive metabolic panel  . Lipase, blood  . Troponin I Once  . Cardiac monitoring  . Saline Lock IV, Maintain IV access  . Vital signs  . Inpatient consult to Cardiology  . Consult to hospitalist  . Pulse oximetry, continuous  . I-stat troponin, ED  . I-stat Chem 8, ED  . I-stat troponin, ED  . I-stat chem 8, ed  . ED EKG within 10 minutes  . EKG 12-Lead    Following Medications were ordered in ER: Medications  promethazine (PHENERGAN) injection 25 mg (25 mg Intravenous Given  10/14/18 1752)  sodium chloride 0.9 % bolus 500 mL (0 mLs Intravenous Stopped 10/14/18 1834)  fentaNYL (SUBLIMAZE) injection 25 mcg (25 mcg Intravenous Given 10/14/18 1752)    Significant initial  Findings: Abnormal Labs Reviewed  CBC WITH DIFFERENTIAL/PLATELET - Abnormal; Notable for the following components:      Result Value   RBC 1.26 (*)    Hemoglobin 3.8 (*)    HCT 13.1 (*)    MCV 104.0 (*)    MCHC 29.0 (*)    RDW 15.9 (*)    Platelets 86 (*)    Lymphs Abs 0.2 (*)    Abs Immature Granulocytes 0.10 (*)    All other components within normal limits  CBC WITH DIFFERENTIAL/PLATELET - Abnormal; Notable for the following components:   WBC 15.5 (*)    Hemoglobin 12.7 (*)    RDW 16.0 (*)    Neutro Abs 13.7 (*)    Abs Immature Granulocytes 0.25 (*)    All other components within normal limits  BASIC METABOLIC PANEL - Abnormal; Notable for the following components:   Chloride 94 (*)    Glucose, Bld 138 (*)    BUN 25 (*)    Creatinine, Ser 6.13 (*)    GFR calc non Af Amer 9 (*)    GFR calc Af Amer 10 (*)  All other components within normal limits  COMPREHENSIVE METABOLIC PANEL - Abnormal; Notable for the following components:   Chloride 94 (*)    Glucose, Bld 120 (*)    BUN 25 (*)    Creatinine, Ser 6.20 (*)    GFR calc non Af Amer 9 (*)    GFR calc Af Amer 10 (*)    All other components within normal limits  I-STAT TROPONIN, ED - Abnormal; Notable for the following components:   Troponin i, poc 0.10 (*)    All other components within normal limits  I-STAT CHEM 8, ED - Abnormal; Notable for the following components:   BUN 24 (*)    Creatinine, Ser 5.80 (*)    Glucose, Bld 203 (*)    Calcium, Ion 0.95 (*)    All other components within normal limits  I-STAT TROPONIN, ED - Abnormal; Notable for the following components:   Troponin i, poc 0.10 (*)    All other components within normal limits  I-STAT CHEM 8, ED - Abnormal; Notable for the following components:   Chloride  95 (*)    BUN 27 (*)    Creatinine, Ser 6.20 (*)    Glucose, Bld 133 (*)    Calcium, Ion 1.02 (*)    TCO2 33 (*)    All other components within normal limits     Na 138  K 4.0  Cr   Stable,  Lab Results  Component Value Date   CREATININE 6.20 (H) 10/14/2018   CREATININE 6.20 (H) 10/14/2018   CREATININE 6.13 (H) 10/14/2018    Lipase 29  WBC  15.5  HG/HCT   stable,         Component Value Date/Time   HGB 14.6 10/14/2018 1946   HCT 43.0 10/14/2018 1946    Troponin (Point of Care Test) Recent Labs    10/14/18 1927  TROPIPOC 0.10*     BNP (last 3 results) Recent Labs    12/06/17 1348  BNP 2,214.9*    ProBNP (last 3 results) No results for input(s): PROBNP in the last 8760 hours.  Lactic Acid, Venous No results found for: LATICACIDVEN    UA anuric   CXR -  NON acute    ECG:  Personally reviewed by me showing: HR : 133 Rhythm:  Sinus tachycardia     no evidence of ischemic changes QTC 498     ED Triage Vitals  Enc Vitals Group     BP 10/14/18 1800 109/69     Pulse Rate 10/14/18 1740 78     Resp 10/14/18 1740 (!) 24     Temp 10/14/18 1835 98.2 F (36.8 C)     Temp Source 10/14/18 1835 Oral     SpO2 10/14/18 1740 93 %     Weight 10/14/18 1738 154 lb 5.2 oz (70 kg)     Height --      Head Circumference --      Peak Flow --      Pain Score --      Pain Loc --      Pain Edu? --      Excl. in [employer]? --   TMAX(24)@       Latest  Blood pressure (!) 103/91, pulse 95, temperature 98.2 F (36.8 C), temperature source Oral, resp. rate (!) 27, weight 70 kg, SpO2 97 %.    ER Provider Called:   Cardiology   They Recommend Cycle CE Will see  in ER  Hospitalist  was called for admission for atypical Chest pain evaluation   Review of Systems:    Pertinent positives include: body aches, burning chest pain   Constitutional:  No weight loss, night sweats, Fevers, chills, fatigue, weight loss  HEENT:  No headaches, Difficulty swallowing,Tooth/dental  problems,Sore throat,  No sneezing, itching, ear ache, nasal congestion, post nasal drip,  Cardio-vascular:  No chest pain, Orthopnea, PND, anasarca, dizziness, palpitations.no Bilateral lower extremity swelling  GI:  No heartburn, indigestion, abdominal pain, nausea, vomiting, diarrhea, change in bowel habits, loss of appetite, melena, blood in stool, hematemesis Resp:  no shortness of breath at rest. No dyspnea on exertion, No excess mucus, no productive cough, No non-productive cough, No coughing up of blood.No change in color of mucus.No wheezing. Skin:  no rash or lesions. No jaundice GU:  no dysuria, change in color of urine, no urgency or frequency. No straining to urinate.  No flank pain.  Musculoskeletal:  No joint pain or no joint swelling. No decreased range of motion. No back pain.  Psych:  No change in mood or affect. No depression or anxiety. No memory loss.  Neuro: no localizing neurological complaints, no tingling, no weakness, no double vision, no gait abnormality, no slurred speech, no confusion  All systems reviewed and apart from [place] all are negative  Past Medical History:   Past Medical History:  Diagnosis Date  . Erectile dysfunction 2012  . ESRD (end stage renal disease) ([place])    w/Left ureteral stone/hydronephrosis/notes 12/06/2017  . Hepatitis 1973   "? kind"  . Hyperlipidemia 2012  . Hypertension   . Tobacco abuse 2012   1/2 pack per day  . Type II diabetes mellitus ([place])       Past Surgical History:  Procedure Laterality Date  . APPENDECTOMY    . AV FISTULA PLACEMENT Right 12/09/2017   Procedure: ARTERIOVENOUS (AV) FISTULA CREATION;  Surgeon: Rosetta Posner, MD;  Location: [facility];  Service: Vascular;  Laterality: Right;  . CARDIAC CATHETERIZATION  05/12/2009   Archie Endo 04/07/2011  . CYSTOSCOPY/URETEROSCOPY/HOLMIUM LASER/STENT PLACEMENT Left 12/08/2017   Procedure: CYSTOSCOPY/RETROGRADE PYLEOGRAM LEFT URETEROSCOPY AND STONE EXTRACTION;  Surgeon: Festus Aloe, MD;  Location: [facility];  Service: Urology;  Laterality: Left;  . HOLMIUM LASER APPLICATION Left 0/0/9381   Procedure: HOLMIUM LASER APPLICATION;  Surgeon: Festus Aloe, MD;  Location: [facility];  Service: Urology;  Laterality: Left;  . INSERTION OF DIALYSIS CATHETER Right 12/09/2017   Procedure: EXCHANGE  OF TUNNELED  DIALYSIS CATHETER RIGHT INTERNAL JUGULAR.;  Surgeon: Rosetta Posner, MD;  Location: [facility];  Service: Vascular;  Laterality: Right;  . IR AV DIALY SHUNT INTRO NEEDLE/INTRACATH INITIAL W/PTA/IMG RIGHT Right 05/05/2018  . IR FLUORO GUIDE CV LINE RIGHT  12/07/2017  . IR REMOVAL TUN CV CATH W/O FL  06/05/2018  . IR US GUIDE VASC ACCESS RIGHT  12/07/2017  . IR US GUIDE VASC ACCESS RIGHT  05/05/2018  . THROMBECTOMY W/ EMBOLECTOMY Right 06/30/2018   Procedure: THROMBECTOMY and revision ARTERIOVENOUS FISTULA right RADIOCEPHALIC;  Surgeon: Angelia Mould, MD;  Location[facility];  Service: Vascular;  Laterality: Right;    Social History:  Ambulatory   Independently      reports that he quit smoking about 22 months ago. His smoking use included cigarettes. He smoked 1.00 pack per day. He has never used smokeless tobacco. He reports that he does not drink alcohol or use drugs.     Family History:   Family History  Problem Relation Age  of Onset  . Congestive Heart Failure Mother   . Diabetes Neg Hx     Allergies: No Known Allergies   Prior to Admission medications   Medication Sig Start Date End Date Taking? Authorizing Provider  aspirin EC 81 MG EC tablet Take 1 tablet (81 mg total) by mouth daily. 12/17/17  Yes Bonnell Public, MD  camphor-menthol [facility]) lotion Apply 1 application topically every 8 (eight) hours as needed for itching. 07/02/18  Yes Irene Pap N, DO  carvedilol (COREG) 12.5 MG tablet Take 1 tablet (12.5 mg total) by mouth 2 (two) times daily with a meal. 12/16/17  Yes Bonnell Public, MD  Darbepoetin Alfa (ARANESP) 200 MCG/0.4ML SOSY injection  246mcg to be given with dialysis every 14 days 12/16/17  Yes Bonnell Public, MD  ferric citrate (AURYXIA) 1 GM 210 MG(Fe) tablet Take 1 tablet (210 mg total) by mouth 3 (three) times daily with meals. 07/02/18  Yes Kayleen Memos, DO  hydrALAZINE (APRESOLINE) 25 MG tablet Take 1 tablet (25 mg total) by mouth every 8 (eight) hours. 12/16/17  Yes Dana Allan I, MD  losartan (COZAAR) 50 MG tablet Take 50 mg by mouth daily. 05/24/18  Yes [provider]  midodrine (PROAMATINE) 10 MG tablet Take 10 mg by mouth See admin instructions. Take 1 tablet (10 mg totally) by mouth three times a week with dialysis 09/29/18  Yes [provider]  mupirocin ointment (BACTROBAN) 2 % Place 1 application into the nose 2 (two) times daily. Patient taking differently: Place 1 application into the nose 2 (two) times daily as needed (for skin rash).  07/02/18  Yes Kayleen Memos, DO  Nutritional Supplements (FEEDING SUPPLEMENT, NEPRO CARB STEADY,) LIQD Take 237 mLs by mouth 2 (two) times daily between meals. 12/16/17  Yes Bonnell Public, MD  ondansetron (ZOFRAN) 4 MG tablet Take 1 tablet (4 mg total) by mouth every 6 (six) hours as needed for nausea. 07/02/18  Yes Kayleen Memos, DO  oxyCODONE (ROXICODONE) 5 MG immediate release tablet Take 1 tablet (5 mg total) by mouth every 4 (four) hours as needed for severe pain. 06/30/18  Yes Angelia Mould, MD  pantoprazole (PROTONIX) 40 MG tablet Take 1 tablet (40 mg total) by mouth 2 (two) times daily before a meal. 12/16/17  Yes Bonnell Public, MD  ranitidine (ZANTAC) 150 MG tablet Take 150 mg by mouth daily. 05/24/18  Yes [provider]  rOPINIRole (REQUIP) 0.25 MG tablet Take 0.25-0.5 mg by mouth at bedtime. 09/29/18  Yes [provider]  tamsulosin (FLOMAX) 0.4 MG CAPS capsule Take 1 capsule (0.4 mg total) by mouth daily. 12/16/17  Yes Dana Allan I, MD  nicotine (NICODERM CQ - DOSED IN MG/24 HOURS) 21 mg/24hr patch  Place 1 patch (21 mg total) onto the skin daily. Patient not taking: Reported on 10/14/2018 07/03/18   Kayleen Memos, DO  polyethylene glycol (MIRALAX / GLYCOLAX) packet Take 17 g by mouth daily. Patient not taking: Reported on 10/14/2018 07/03/18   Kayleen Memos, DO  senna-docusate (SENOKOT-S) 8.6-50 MG tablet Take 1 tablet by mouth 2 (two) times daily. Patient not taking: Reported on 10/14/2018 12/16/17   Bonnell Public, MD   Physical Exam: Blood pressure (!) 103/91, pulse 95, temperature 98.2 F (36.8 C), temperature source Oral, resp. rate (!) 27, weight 70 kg, SpO2 97 %. 1. General:  in No Acute distress   Chronically ill -appearing 2. Psychological: Alert and   Oriented  3. Head/ENT:     Dry Mucous Membranes                          Head Non traumatic, neck supple                            Poor Dentition 4. SKIN:   decreased Skin turgor,  Skin clean Dry and intact no rash 5. Heart: Regular rate and rhythm no Murmur, no Rub or gallop 6. Lungs:  Clear to auscultation bilaterally, no wheezes or crackles chest pain reproducible by palpation 7. Abdomen: Soft,  non-tender, Non distended bowel sounds present 8. Lower extremities: no clubbing, cyanosis, or  edema 9. Neurologically Grossly intact, moving all 4 extremities equally   10. MSK: Normal range of motion   LABS:     Recent Labs  Lab 10/14/18 1746 10/14/18 1758 10/14/18 1920 10/14/18 1946  WBC 4.9  --  15.5*  --   NEUTROABS 4.3  --  13.7*  --   HGB 3.8* 16.0 12.7* 14.6  HCT 13.1* 47.0 40.2 43.0  MCV 104.0*  --  94.4  --   PLT 86*  --  290  --    Basic Metabolic Panel: Recent Labs  Lab 10/14/18 1758 10/14/18 1920 10/14/18 1946 10/14/18 1952  NA 135 136 137 138  K 4.5 3.7 3.8 4.0  CL 98 94* 95* 94*  CO2  --  28  --  30  GLUCOSE 203* 138* 133* 120*  BUN 24* 25* 27* 25*  CREATININE 5.80* 6.13* 6.20* 6.20*  CALCIUM  --  9.2  --  9.4      Recent Labs  Lab 10/14/18 1952  AST 20  ALT 27  ALKPHOS 57    BILITOT 0.8  PROT 7.8  ALBUMIN 3.7   Recent Labs  Lab 10/14/18 1952  LIPASE 29   No results for input(s): AMMONIA in the last 168 hours.    HbA1C: No results for input(s): HGBA1C in the last 72 hours. CBG: No results for input(s): GLUCAP in the last 168 hours.    Urine analysis:    Component Value Date/Time   COLORURINE YELLOW 12/06/2017 1339   APPEARANCEUR CLEAR 12/06/2017 1339   LABSPEC 1.011 12/06/2017 1339   PHURINE 5.0 12/06/2017 1339   GLUCOSEU 50 (A) 12/06/2017 1339   HGBUR MODERATE (A) 12/06/2017 1339   BILIRUBINUR NEGATIVE 12/06/2017 1339   KETONESUR NEGATIVE 12/06/2017 1339   PROTEINUR >=300 (A) 12/06/2017 1339   NITRITE NEGATIVE 12/06/2017 1339   LEUKOCYTESUR NEGATIVE 12/06/2017 1339       Cultures: No results found for: SDES, [place], CULT, REPTSTATUS   Radiological Exams on Admission: Dg Chest 2 View  Result Date: 10/14/2018 CLINICAL DATA:  Sudden onset chest pain and shortness of breath, pain radiating to neck. EXAM: CHEST - 2 VIEW COMPARISON:  Chest x-ray dated 06/30/2018. FINDINGS: Heart size and mediastinal contours are within normal limits. Atherosclerotic changes noted at the aortic arch. Lungs are clear. No pleural effusion or pneumothorax seen. Osseous structures about the chest are unremarkable. IMPRESSION: No active cardiopulmonary disease. No evidence of pneumonia or pulmonary edema. Electronically Signed   By: Franki Cabot M.D.   On: 10/14/2018 19:06    Chart has been reviewed    Assessment/Plan  57 y.o. male with medical history significant of ESRD on HD (T/Th/Sat), diabetes, HTN, GERD HLD, DM 2 Admitted for Chest pain  Present  on Admission:. Chest pain  -very atypical presentation with neck pain chest pain abdominal pain and leg cramps as per cardiology recommendations will observe and cycle cardiac enzymes etiologies could be musculoskeletal versus electrolyte imbalance in the setting of recent hemodialysis.  Will check magnesium  and phosphate levels . Essential hypertension stable continue home medications . ESRD (end stage renal disease) [facility]) hemodialysis on Tuesday Thursday Saturday has undergone last hemodialysis on Saturday Next due on Tuesday follow-up with nephrology Hyperlipidemia continue home medications  . DM (diabetes mellitus), type 2 with renal complications (HCC) -diet controlled continue to monitor blood sugars   Other plan as per orders.  DVT prophylaxis: hep [place]    Code Status:  FULL CODE as per patient   I had personally discussed CODE STATUS with patient    Family Communication:   Family not  at  Bedside    Disposition Plan:      To home once workup is complete and patient is stable                      Would benefit from PT/OT eval prior to DC  Ordered                                        Consults called: Cardiology aware    Admission status:   Obs    Level of care     SDU tele indefinitely please discontinue once patient no longer qualifies      Fernando Jones 10/14/2018, 10:56 PM    [facility]  Pager [phone]   after 2 AM please page floor coverage PA If 7AM-7PM, please contact the day team taking care of the patient  [email or web address]  Password [email or web address]

## 2018-10-14 NOTE — ED Provider Notes (Signed)
[place] EMERGENCY DEPARTMENT Provider Note   CSN: [id number] Arrival date & time: 10/14/18  1732     History   Chief Complaint Chief Complaint  Patient presents with  . Chest Pain    HPI Fernando Jones is a 57 y.o. male with a past medical history of ESRD on dialysis [facility], diabetes, hypertension, hyperlipidemia, tobacco abuse who presents to ED for evaluation of left-sided chest pain for the past 1-1/2 hours.  States that he completed his regularly scheduled dialysis session today and was driving home when he began suddenly having the chest pain.  He then reported generalized cramping all over his body as well.  Denies any improvement with 324 of aspirin and 1 nitroglycerin given by EMS.  Patient appears to be a poor historian so remainder of history is limited.  HPI  Past Medical History:  Diagnosis Date  . Erectile dysfunction 2012  . ESRD (end stage renal disease) ([place])    w/Left ureteral stone/hydronephrosis/notes 12/06/2017  . Hepatitis 1973   "? kind"  . Hyperlipidemia 2012  . Hypertension   . Tobacco abuse 2012   1/2 pack per day  . Type II diabetes mellitus [facility])     Patient Active Problem List   Diagnosis Date Noted  . Hyperkalemia 06/29/2018  . Malnutrition of moderate degree 12/15/2017  . ESRD (end stage renal disease) ([place]) 12/06/2017  . Severe Vitamin D deficiency 10/14/2016  . Diastolic dysfunction with acute on chronic heart failure ([place]) 10/13/2016  . Acute respiratory failure with hypoxia ([place]) 10/12/2016  . Anasarca 10/12/2016  . Acute renal failure (ARF) ([place]) 10/12/2016  . Hypertensive urgency 10/12/2016  . Acute respiratory failure ([place]) 10/12/2016  . ERECTILE DYSFUNCTION, NON-ORGANIC 06/23/2009  . DM (diabetes mellitus), type 2 with renal complications ([place]) 88/89/1694  . HYPERLIPIDEMIA 05/20/2009  . TOBACCO ABUSE 05/20/2009  . Essential hypertension 05/20/2009  . CONSTIPATION 05/20/2009    Past Surgical History:   Procedure Laterality Date  . APPENDECTOMY    . AV FISTULA PLACEMENT Right 12/09/2017   Procedure: ARTERIOVENOUS (AV) FISTULA CREATION;  Surgeon: Rosetta Posner, MD;  Location: [place];  Service: Vascular;  Laterality: Right;  . CARDIAC CATHETERIZATION  05/12/2009   Archie Endo 04/07/2011  . CYSTOSCOPY/URETEROSCOPY/HOLMIUM LASER/STENT PLACEMENT Left 12/08/2017   Procedure: CYSTOSCOPY/RETROGRADE PYLEOGRAM LEFT URETEROSCOPY AND STONE EXTRACTION;  Surgeon: Festus Aloe, MD;  Location: [facility];  Service: Urology;  Laterality: Left;  . HOLMIUM LASER APPLICATION Left 5/0/3888   Procedure: HOLMIUM LASER APPLICATION;  Surgeon: Festus Aloe, MD;  Location: [facility];  Service: Urology;  Laterality: Left;  . INSERTION OF DIALYSIS CATHETER Right 12/09/2017   Procedure: EXCHANGE  OF TUNNELED  DIALYSIS CATHETER RIGHT INTERNAL JUGULAR.;  Surgeon: Rosetta Posner, MD;  Location: [place];  Service: Vascular;  Laterality: Right;  . IR AV DIALY SHUNT INTRO NEEDLE/INTRACATH INITIAL W/PTA/IMG RIGHT Right 05/05/2018  . IR FLUORO GUIDE CV LINE RIGHT  12/07/2017  . IR REMOVAL TUN CV CATH W/O FL  06/05/2018  . IR US GUIDE VASC ACCESS RIGHT  12/07/2017  . IR US GUIDE VASC ACCESS RIGHT  05/05/2018  . THROMBECTOMY W/ EMBOLECTOMY Right 06/30/2018   Procedure: THROMBECTOMY and revision ARTERIOVENOUS FISTULA right RADIOCEPHALIC;  Surgeon: Angelia Mould, MD;  Location: [facility] OR;  Service: Vascular;  Laterality: Right;        Home Medications    Prior to Admission medications   Medication Sig Start Date End Date Taking? Authorizing Provider  aspirin EC 81 MG EC  tablet Take 1 tablet (81 mg total) by mouth daily. Patient not taking: Reported on 06/29/2018 12/17/17   Bonnell Public, MD  camphor-menthol [facility]) lotion Apply 1 application topically every 8 (eight) hours as needed for itching. 07/02/18   Kayleen Memos, DO  carvedilol (COREG) 12.5 MG tablet Take 1 tablet (12.5 mg total) by mouth 2 (two) times daily with a meal. 12/16/17    Bonnell Public, MD  Darbepoetin Alfa (ARANESP) 200 MCG/0.4ML SOSY injection 261mcg to be given with dialysis every 14 days 12/16/17   Bonnell Public, MD  ferric citrate (AURYXIA) 1 GM 210 MG(Fe) tablet Take 1 tablet (210 mg total) by mouth 3 (three) times daily with meals. 07/02/18   Kayleen Memos, DO  hydrALAZINE (APRESOLINE) 25 MG tablet Take 1 tablet (25 mg total) by mouth every 8 (eight) hours. Patient not taking: Reported on 06/29/2018 12/16/17   Dana Allan I, MD  losartan (COZAAR) 50 MG tablet Take 50 mg by mouth daily. 05/24/18   [provider]  mupirocin ointment (BACTROBAN) 2 % Place 1 application into the nose 2 (two) times daily. 07/02/18   Kayleen Memos, DO  nicotine (NICODERM CQ - DOSED IN MG/24 HOURS) 21 mg/24hr patch Place 1 patch (21 mg total) onto the skin daily. 07/03/18   Kayleen Memos, DO  Nutritional Supplements (FEEDING SUPPLEMENT, NEPRO CARB STEADY,) LIQD Take 237 mLs by mouth 2 (two) times daily between meals. 12/16/17   Bonnell Public, MD  ondansetron (ZOFRAN) 4 MG tablet Take 1 tablet (4 mg total) by mouth every 6 (six) hours as needed for nausea. 07/02/18   Kayleen Memos, DO  oxyCODONE (ROXICODONE) 5 MG immediate release tablet Take 1 tablet (5 mg total) by mouth every 4 (four) hours as needed for severe pain. 06/30/18   Angelia Mould, MD  pantoprazole (PROTONIX) 40 MG tablet Take 1 tablet (40 mg total) by mouth 2 (two) times daily before a meal. 12/16/17   Bonnell Public, MD  polyethylene glycol (MIRALAX / GLYCOLAX) packet Take 17 g by mouth daily. 07/03/18   Kayleen Memos, DO  ranitidine (ZANTAC) 150 MG tablet Take 150 mg by mouth daily. 05/24/18   [provider]  senna-docusate (SENOKOT-S) 8.6-50 MG tablet Take 1 tablet by mouth 2 (two) times daily. 12/16/17   Dana Allan I, MD  tamsulosin (FLOMAX) 0.4 MG CAPS capsule Take 1 capsule (0.4 mg total) by mouth daily. 12/16/17   Bonnell Public, MD    Family  History Family History  Problem Relation Age of Onset  . Congestive Heart Failure Mother   . Diabetes Neg Hx     Social History Social History   Tobacco Use  . Smoking status: Former Smoker    Packs/day: 1.00    Types: Cigarettes    Last attempt to quit: 12/06/2016    Years since quitting: 1.8  . Smokeless tobacco: Never Used  Substance Use Topics  . Alcohol use: No  . Drug use: No     Allergies   Patient has no known allergies.   Review of Systems Review of Systems  Constitutional: Negative for appetite change, chills and fever.  HENT: Negative for ear pain, rhinorrhea, sneezing and sore throat.   Eyes: Negative for photophobia and visual disturbance.  Respiratory: Negative for cough, chest tightness, shortness of breath and wheezing.   Cardiovascular: Positive for chest pain. Negative for palpitations.  Gastrointestinal: Negative for abdominal pain, blood in stool, constipation, diarrhea,  nausea and vomiting.  Genitourinary: Negative for dysuria, hematuria and urgency.  Musculoskeletal: Positive for myalgias.  Skin: Negative for rash.  Neurological: Negative for dizziness, weakness and light-headedness.     Physical Exam Updated Vital Signs BP (!) 100/53   Pulse 93   Temp 98.2 F (36.8 C) (Oral)   Resp (!) 25   Wt 70 kg   SpO2 99%   BMI 25.68 kg/m   Physical Exam  Constitutional: He appears well-developed and well-nourished. No distress.  Patient groaning in pain, hyperventilating.  HENT:  Head: Normocephalic and atraumatic.  Nose: Nose normal.  Eyes: Conjunctivae and EOM are normal. Left eye exhibits no discharge. No scleral icterus.  Neck: Normal range of motion. Neck supple.  Cardiovascular: Normal rate, regular rhythm, normal heart sounds and intact distal pulses. Exam reveals no gallop and no friction rub.  No murmur heard. Pulmonary/Chest: Effort normal and breath sounds normal. No respiratory distress.  Abdominal: Soft. Bowel sounds are normal.  He exhibits no distension. There is no tenderness. There is no guarding.  Diffuse TTP of chest and abdomen, no rebound or guarding.  Musculoskeletal: Normal range of motion. He exhibits no edema.  Neurological: He is alert. He exhibits normal muscle tone. Coordination normal.  Skin: Skin is warm and dry. No rash noted.  Psychiatric: He has a normal mood and affect.  Nursing note and vitals reviewed.    ED Treatments / Results  Labs (all labs ordered are listed, but only abnormal results are displayed) Labs Reviewed  CBC WITH DIFFERENTIAL/PLATELET - Abnormal; Notable for the following components:      Result Value   RBC 1.26 (*)    Hemoglobin 3.8 (*)    HCT 13.1 (*)    MCV 104.0 (*)    MCHC 29.0 (*)    RDW 15.9 (*)    Platelets 86 (*)    Lymphs Abs 0.2 (*)    Abs Immature Granulocytes 0.10 (*)    All other components within normal limits  CBC WITH DIFFERENTIAL/PLATELET - Abnormal; Notable for the following components:   WBC 15.5 (*)    Hemoglobin 12.7 (*)    RDW 16.0 (*)    Neutro Abs 13.7 (*)    Abs Immature Granulocytes 0.25 (*)    All other components within normal limits  BASIC METABOLIC PANEL - Abnormal; Notable for the following components:   Chloride 94 (*)    Glucose, Bld 138 (*)    BUN 25 (*)    Creatinine, Ser 6.13 (*)    GFR calc non Af Amer 9 (*)    GFR calc Af Amer 10 (*)    All other components within normal limits  COMPREHENSIVE METABOLIC PANEL - Abnormal; Notable for the following components:   Chloride 94 (*)    Glucose, Bld 120 (*)    BUN 25 (*)    Creatinine, Ser 6.20 (*)    GFR calc non Af Amer 9 (*)    GFR calc Af Amer 10 (*)    All other components within normal limits  I-STAT TROPONIN, ED - Abnormal; Notable for the following components:   Troponin i, poc 0.10 (*)    All other components within normal limits  I-STAT CHEM 8, ED - Abnormal; Notable for the following components:   BUN 24 (*)    Creatinine, Ser 5.80 (*)    Glucose, Bld 203 (*)     Calcium, Ion 0.95 (*)    All other components within normal limits  I-STAT  TROPONIN, ED - Abnormal; Notable for the following components:   Troponin i, poc 0.10 (*)    All other components within normal limits  I-STAT CHEM 8, ED - Abnormal; Notable for the following components:   Chloride 95 (*)    BUN 27 (*)    Creatinine, Ser 6.20 (*)    Glucose, Bld 133 (*)    Calcium, Ion 1.02 (*)    TCO2 33 (*)    All other components within normal limits  LIPASE, BLOOD  I-STAT CHEM 8, ED    EKG EKG Interpretation  Date/Time:  Saturday October 14 2018 17:42:25 EST Ventricular Rate:  113 PR Interval:    QRS Duration: 102 QT Interval:  363 QTC Calculation: 498 R Axis:   64 Text Interpretation:  Sinus tachycardia Probable left atrial enlargement Abnormal T, consider ischemia, lateral leads Since last tracing rate faster Confirmed by Wandra Arthurs [phone]) on 10/14/2018 5:48:20 PM   Radiology Dg Chest 2 View  Result Date: 10/14/2018 CLINICAL DATA:  Sudden onset chest pain and shortness of breath, pain radiating to neck. EXAM: CHEST - 2 VIEW COMPARISON:  Chest x-ray dated 06/30/2018. FINDINGS: Heart size and mediastinal contours are within normal limits. Atherosclerotic changes noted at the aortic arch. Lungs are clear. No pleural effusion or pneumothorax seen. Osseous structures about the chest are unremarkable. IMPRESSION: No active cardiopulmonary disease. No evidence of pneumonia or pulmonary edema. Electronically Signed   By: Franki Cabot M.D.   On: 10/14/2018 19:06    Procedures Procedures (including critical care time)  CRITICAL CARE Performed by: Delia Heady   Total critical care time: 45 minutes  Critical care time was exclusive of separately billable procedures and treating other patients.  Critical care was necessary to treat or prevent imminent or life-threatening deterioration.  Critical care was time spent personally by me on the following activities: development of  treatment plan with patient and/or surrogate as well as nursing, discussions with consultants, evaluation of patient's response to treatment, examination of patient, obtaining history from patient or surrogate, ordering and performing treatments and interventions, ordering and review of laboratory studies, ordering and review of radiographic studies, pulse oximetry and re-evaluation of patient's condition.   Medications Ordered in ED Medications  promethazine (PHENERGAN) injection 25 mg (25 mg Intravenous Given 10/14/18 1752)  sodium chloride 0.9 % bolus 500 mL (0 mLs Intravenous Stopped 10/14/18 1834)  fentaNYL (SUBLIMAZE) injection 25 mcg (25 mcg Intravenous Given 10/14/18 1752)     Initial Impression / Assessment and Plan / ED Course  I have reviewed the triage vital signs and the nursing notes.  Pertinent labs & imaging results that were available during my care of the patient were reviewed by me and considered in my medical decision making (see chart for details).  Clinical Course as of Oct 14 2205  Sat Oct 14, 2018  1818 Creatinine(!): 5.80 [HK]  1818 Troponin i, poc(!!): 0.10 [HK]  1818 Per chart review, patient's baseline troponin appears to be 0.04-0.07.   [HK]  5009 Patient reports improvement in symptoms with fentanyl and Phenergan.   [HK]  1857 Hemoglobin: 16.0 [HK]  1857 Hemoglobin(!!): 3.8 [HK]  1857 Will redraw labs because of large difference in hgbs   [HK]  1901 Patient denies any active bleeding.   [HK]  1916 Patient continuing to rest comfortably.   [HK]  2034 Recheck  Hemoglobin(!): 12.7 [HK]  2113 Cardiology to see patient.   [HK]  2124 Cardiology recommends trending troponins.  If lab  troponin is greater than 0.15, can consider anticoagulation.  They recommend admission to hospitalist and will cardiology is consulting.   [HK]    Clinical Course User Index [HK] Delia Heady, PA-C    57 year old male with a past medical history of ESRD on dialysis TuThSa,  diabetes, hypertension, tobacco abuse who presents to ED for left-sided chest pain for the past 1-1/2 hours. This began as he was driving back from dialysis. States that he completed his dialysis session to completion and has not missed any sessions. He also complains of generalized cramping "all over." On my initial exam, patient appeared uncomfortable, diffusely tender throughout his body.  He was also hyperventilating.  Equal, symmetric distal pulses.  Patient initially poor historian even with the use of translator at bedside.  Patient given Phenergan and fentanyl with improvement in his symptoms.  Lab work shows creatinine of 6.1, GFR of 9.  Hemoglobin on third recheck is 12 point 2:07 discrepancies and i-STAT and lab values.  Patient denies any active bleeding.  EKG shows sinus tachycardia but otherwise similar to prior tracings.  Chest x-ray is negative.  Troponin elevated at 0.1 initially and on recheck.  Patient reports improvement in his symptoms but continues to have a slight "burning" sensation in his chest.  His troponin is elevated compared to his baseline of 0.04-0.07.  Will consult cardiology patient will need admission for ACS rule out due to chest pain and troponin elevation. Please see cardiologist note for further detail. Hospitalist to admit. Patient discussed with and seen by my attending, Dr. Darl Householder.  Portions of this note were generated with [employer]. Dictation errors may occur despite best attempts at proofreading.  Final Clinical Impressions(s) / ED Diagnoses   Final diagnoses:  None    ED Discharge Orders    None       Delia Heady, PA-C 10/14/18 2207    Drenda Freeze, MD 10/15/18 [phone]

## 2018-10-14 NOTE — ED Notes (Signed)
Pt transported to xray 

## 2018-10-14 NOTE — ED Notes (Signed)
Pt aware pf need for urine sample, urinal at bedside

## 2018-10-14 NOTE — Consult Note (Addendum)
Cardiology Consultation Note    Patient ID: Fernando Jones, MRN: [id number], DOB/AGE: [birth year] 57 y.o. Admit date: 10/14/2018   Date of Consult: 10/14/2018 Primary Physician: Patient, No Pcp Per Primary Cardiologist: None  Chief Complaint: "pain everywhere" Reason for Consultation: positive troponin Requesting MD: Darl Householder  HPI: Fernando Jones is a 57 y.o. male with a history of ESRD on HD (T/Th/Sat), diabetes, HTN, GERD, and tobacco abuse presents with diffuse body pains and found to have troponin of 0.10. He has had Troponin I in the past as high as 0.07, I-stat POC troponin I of 0.10 x 2. He has no ECG changes from prior ECGs. He reports neck, chest, abdominal, back, and leg discomfort, "everywhere". He also reports being very hungry. He denies nausea, vomiting, palpitations, syncope, dizziness, headache. He had dialysis today without incident.   Of note, he has WBC of 15K with left shift. He has a history of an echocardiogram in 2017 which revealed a PA pressure of 52 mmHg, with normal EF 51-88%, grade 3 diastolic dysfunction with LVH. He has no history of a stress test but thinks he may have had a cath "15 years go", though we have no records of this.  Past Medical History:  Diagnosis Date  . Erectile dysfunction 2012  . ESRD (end stage renal disease) ([place])    w/Left ureteral stone/hydronephrosis/notes 12/06/2017  . Hepatitis 1973   "? kind"  . Hyperlipidemia 2012  . Hypertension   . Tobacco abuse 2012   1/2 pack per day  . Type II diabetes mellitus ([place])       Surgical History:  Past Surgical History:  Procedure Laterality Date  . APPENDECTOMY    . AV FISTULA PLACEMENT Right 12/09/2017   Procedure: ARTERIOVENOUS (AV) FISTULA CREATION;  Surgeon: Rosetta Posner, MD;  Location: [facility];  Service: Vascular;  Laterality: Right;  . CARDIAC CATHETERIZATION  05/12/2009   Archie Endo 04/07/2011  . CYSTOSCOPY/URETEROSCOPY/HOLMIUM LASER/STENT PLACEMENT Left 12/08/2017   Procedure:  CYSTOSCOPY/RETROGRADE PYLEOGRAM LEFT URETEROSCOPY AND STONE EXTRACTION;  Surgeon: Festus Aloe, MD;  Location: [facility];  Service: Urology;  Laterality: Left;  . HOLMIUM LASER APPLICATION Left 03/06/6605   Procedure: HOLMIUM LASER APPLICATION;  Surgeon: Festus Aloe, MD;  Location: [facility];  Service: Urology;  Laterality: Left;  . INSERTION OF DIALYSIS CATHETER Right 12/09/2017   Procedure: EXCHANGE  OF TUNNELED  DIALYSIS CATHETER RIGHT INTERNAL JUGULAR.;  Surgeon: Rosetta Posner, MD;  Location: [facility];  Service: Vascular;  Laterality: Right;  . IR AV DIALY SHUNT INTRO NEEDLE/INTRACATH INITIAL W/PTA/IMG RIGHT Right 05/05/2018  . IR FLUORO GUIDE CV LINE RIGHT  12/07/2017  . IR REMOVAL TUN CV CATH W/O FL  06/05/2018  . IR US GUIDE VASC ACCESS RIGHT  12/07/2017  . IR US GUIDE VASC ACCESS RIGHT  05/05/2018  . THROMBECTOMY W/ EMBOLECTOMY Right 06/30/2018   Procedure: THROMBECTOMY and revision ARTERIOVENOUS FISTULA right RADIOCEPHALIC;  Surgeon: Angelia Mould, MD;  Location: [facility];  Service: Vascular;  Laterality: Right;     Home Meds: Prior to Admission medications   Medication Sig Start Date End Date Taking? Authorizing Provider  aspirin EC 81 MG EC tablet Take 1 tablet (81 mg total) by mouth daily. 12/17/17  Yes Bonnell Public, MD  camphor-menthol [facility]) lotion Apply 1 application topically every 8 (eight) hours as needed for itching. 07/02/18  Yes Irene Pap N, DO  carvedilol (COREG) 12.5 MG tablet Take 1 tablet (12.5 mg total) by mouth 2 (two) times  daily with a meal. 12/16/17  Yes Bonnell Public, MD  Darbepoetin Alfa (ARANESP) 200 MCG/0.4ML SOSY injection 284mcg to be given with dialysis every 14 days 12/16/17  Yes Bonnell Public, MD  ferric citrate (AURYXIA) 1 GM 210 MG(Fe) tablet Take 1 tablet (210 mg total) by mouth 3 (three) times daily with meals. 07/02/18  Yes Kayleen Memos, DO  hydrALAZINE (APRESOLINE) 25 MG tablet Take 1 tablet (25 mg total) by mouth every 8 (eight)  hours. 12/16/17  Yes Dana Allan I, MD  losartan (COZAAR) 50 MG tablet Take 50 mg by mouth daily. 05/24/18  Yes [provider]  midodrine (PROAMATINE) 10 MG tablet Take 10 mg by mouth See admin instructions. Take 1 tablet (10 mg totally) by mouth three times a week with dialysis 09/29/18  Yes [provider]  mupirocin ointment (BACTROBAN) 2 % Place 1 application into the nose 2 (two) times daily. Patient taking differently: Place 1 application into the nose 2 (two) times daily as needed (for skin rash).  07/02/18  Yes Kayleen Memos, DO  Nutritional Supplements (FEEDING SUPPLEMENT, NEPRO CARB STEADY,) LIQD Take 237 mLs by mouth 2 (two) times daily between meals. 12/16/17  Yes Bonnell Public, MD  ondansetron (ZOFRAN) 4 MG tablet Take 1 tablet (4 mg total) by mouth every 6 (six) hours as needed for nausea. 07/02/18  Yes Kayleen Memos, DO  oxyCODONE (ROXICODONE) 5 MG immediate release tablet Take 1 tablet (5 mg total) by mouth every 4 (four) hours as needed for severe pain. 06/30/18  Yes Angelia Mould, MD  pantoprazole (PROTONIX) 40 MG tablet Take 1 tablet (40 mg total) by mouth 2 (two) times daily before a meal. 12/16/17  Yes Bonnell Public, MD  ranitidine (ZANTAC) 150 MG tablet Take 150 mg by mouth daily. 05/24/18  Yes [provider]  rOPINIRole (REQUIP) 0.25 MG tablet Take 0.25-0.5 mg by mouth at bedtime. 09/29/18  Yes [provider]  tamsulosin (FLOMAX) 0.4 MG CAPS capsule Take 1 capsule (0.4 mg total) by mouth daily. 12/16/17  Yes Dana Allan I, MD  nicotine (NICODERM CQ - DOSED IN MG/24 HOURS) 21 mg/24hr patch Place 1 patch (21 mg total) onto the skin daily. Patient not taking: Reported on 10/14/2018 07/03/18   Kayleen Memos, DO  polyethylene glycol (MIRALAX / GLYCOLAX) packet Take 17 g by mouth daily. Patient not taking: Reported on 10/14/2018 07/03/18   Kayleen Memos, DO  senna-docusate (SENOKOT-S) 8.6-50 MG tablet Take 1 tablet by  mouth 2 (two) times daily. Patient not taking: Reported on 10/14/2018 12/16/17   Bonnell Public, MD    Inpatient Medications:  N/A   Allergies: No Known Allergies  Social History   Socioeconomic History  . Marital status: Divorced    Spouse name: Not on file  . Number of children: Not on file  . Years of education: Not on file  . Highest education level: Not on file  Occupational History  . Not on file  Social Needs  . Financial resource strain: Not on file  . Food insecurity:    Worry: Not on file    Inability: Not on file  . Transportation needs:    Medical: Not on file    Non-medical: Not on file  Tobacco Use  . Smoking status: Former Smoker    Packs/day: 1.00    Types: Cigarettes    Last attempt to quit: 12/06/2016    Years since quitting: 1.8  . Smokeless  tobacco: Never Used  Substance and Sexual Activity  . Alcohol use: No  . Drug use: No  . Sexual activity: Not Currently  Lifestyle  . Physical activity:    Days per week: Not on file    Minutes per session: Not on file  . Stress: Not on file  Relationships  . Social connections:    Talks on phone: Not on file    Gets together: Not on file    Attends religious service: Not on file    Active member of club or organization: Not on file    Attends meetings of clubs or organizations: Not on file    Relationship status: Not on file  . Intimate partner violence:    Fear of current or ex partner: Not on file    Emotionally abused: Not on file    Physically abused: Not on file    Forced sexual activity: Not on file  Other Topics Concern  . Not on file  Social History Narrative  . Not on file     Family History  Problem Relation Age of Onset  . Congestive Heart Failure Mother   . Diabetes Neg Hx      Review of Systems: As documented above. General: negative for chills, fever, night sweats or weight changes.  Cardiovascular: negative for edema, orthopnea, palpitations, paroxysmal nocturnal dyspnea,  shortness of breath or dyspnea on exertion Dermatological: negative for rash Respiratory: negative for cough or wheezing Urologic: negative for hematuria Abdominal: negative for nausea, vomiting, diarrhea, bright red blood per rectum, melena, or hematemesis Neurologic: negative for visual changes, syncope, or dizziness All other systems reviewed and are otherwise negative except as noted above.  Labs: No results for input(s): CKTOTAL, CKMB, TROPONINI in the last 72 hours. Lab Results  Component Value Date   WBC 15.5 (H) 10/14/2018   HGB 14.6 10/14/2018   HCT 43.0 10/14/2018   MCV 94.4 10/14/2018   PLT 290 10/14/2018    Recent Labs  Lab 10/14/18 1952  NA 138  K 4.0  CL 94*  CO2 30  BUN 25*  CREATININE 6.20*  CALCIUM 9.4  PROT 7.8  BILITOT 0.8  ALKPHOS 57  ALT 27  AST 20  GLUCOSE 120*   Lab Results  Component Value Date   CHOL 224 (H) 06/23/2009   HDL 30 (L) 06/23/2009   LDLCALC See Comment mg/dL 06/23/2009   TRIG 407 (H) 06/23/2009   No results found for: DDIMER  Radiology/Studies:  Dg Chest 2 View  Result Date: 10/14/2018 CLINICAL DATA:  Sudden onset chest pain and shortness of breath, pain radiating to neck. EXAM: CHEST - 2 VIEW COMPARISON:  Chest x-ray dated 06/30/2018. FINDINGS: Heart size and mediastinal contours are within normal limits. Atherosclerotic changes noted at the aortic arch. Lungs are clear. No pleural effusion or pneumothorax seen. Osseous structures about the chest are unremarkable. IMPRESSION: No active cardiopulmonary disease. No evidence of pneumonia or pulmonary edema. Electronically Signed   By: Franki Cabot M.D.   On: 10/14/2018 19:06    Wt Readings from Last 3 Encounters:  10/14/18 70 kg  07/02/18 70.9 kg  02/21/18 65.2 kg    EKG:  Unchanged from prior with lateral T wave inversions.   Physical Exam: Blood pressure 104/82, pulse 95, temperature 98.2 F (36.8 C), temperature source Oral, resp. rate 19, weight 70 kg, SpO2 97 %.  Body mass index is 25.68 kg/m. General: Laying flat and comfortably sleeping when I entered the room. Conversant and laying  flat throughout my exam. Head: Normocephalic, atraumatic, sclera non-icteric, no xanthomas, nares are without discharge.  Neck: Negative for carotid bruits. JVD not elevated. Lungs: Clear bilaterally to auscultation without wheezes, rales, or rhonchi. Breathing is unlabored. Heart: RRR with S1 S2. No murmurs, rubs, or gallops appreciated. Abdomen: Soft, non-tender, non-distended with normoactive bowel sounds. No hepatomegaly. No rebound/guarding. No obvious abdominal masses. Msk:  Strength and tone appear normal for age. Extremities: No clubbing or cyanosis. No edema.  Distal pedal pulses are 2+ and equal bilaterally. Neuro: Alert and oriented X 3. No facial asymmetry. No focal deficit. Moves all extremities spontaneously. Psych:  Responds to questions appropriately with a normal affect.     Assessment and Plan  Fernando Jones is a 57 y/o M with a history of ESRD on HD (T/Th/Sat), diabetes, HTN, GERD, and tobacco abuse presents with diffuse body pains (including chest pain) and found to have troponin of 0.10. Currently he is endorsing "pain all over" which includes his chest, neck, back, abdomen, and legs. He has no known history of coronary disease but multiple risk factors as above.  #Positive troponin with diffuse pain -Though the patient has multiple risk factors (moderate risk by HEART score), his symptoms are atypical and his troponin elevation is more likely to be related to demand in the setting of his ESRD. -Please check a serum Troponin I to confirm/verify the POC troponin elevation and repeat his EKG. If these are flat/normal, would cycle troponins x 3 and repeat EKG again tomorrow.  -Reasonable if his troponin is negative and/or stable to pursue an elective nuclear stress test  given his risk factors for CAD placing him at intermediate risk and chest pain being one  of his symptoms. -Agree with further infectious workup given leukocytosis with left shift.  Thank you for this interesting consult. Please contact the cardiology consult service with any further questions or concerns.  Signed, Verneita Griffes, MD 10/14/2018, 9:29 PM   ADDENDUM: Patient's repeat troponins are trending up. Given risk factors and chest pain, would initiate heparin drip and treat for NSTEMI. -Please keep NPO past midnight on Sunday evening into Monday in case of possible LHC -please start Metoprolol 12.5 mg PO QID as blood pressure allows -Already received ASA 324, continue ASA 81 mg PO daily -Nitroglycerin SL PRN. The patient is currently chest pain free laying comfortably in bed. -agree with echo to assess wall motion  The cardiology consult team will follow.  Memory Argue Aldine Contes, MD

## 2018-10-14 NOTE — ED Triage Notes (Signed)
Pt finished dialysis 1.5 hour ago. Pt driving home sudden onset chest pain and SOB, states pain was radiating to neck. Lungs clear. Placed on O2 for comfort. States his body feels like its cramping. 324 ASA. 1 nitro (dropped him 80/30). CBG 162. Pt speaks spanish

## 2018-10-14 NOTE — ED Notes (Signed)
ED Provider at bedside. 

## 2018-10-14 NOTE — ED Notes (Signed)
Dr. Darl Householder notified of HGB

## 2018-10-15 ENCOUNTER — Observation Stay (HOSPITAL_COMMUNITY): Payer: Self-pay

## 2018-10-15 ENCOUNTER — Encounter (HOSPITAL_COMMUNITY): Payer: Self-pay | Admitting: Radiology

## 2018-10-15 ENCOUNTER — Observation Stay (HOSPITAL_BASED_OUTPATIENT_CLINIC_OR_DEPARTMENT_OTHER): Payer: Self-pay

## 2018-10-15 ENCOUNTER — Other Ambulatory Visit: Payer: Self-pay

## 2018-10-15 DIAGNOSIS — R072 Precordial pain: Secondary | ICD-10-CM

## 2018-10-15 DIAGNOSIS — R7989 Other specified abnormal findings of blood chemistry: Secondary | ICD-10-CM

## 2018-10-15 DIAGNOSIS — F17200 Nicotine dependence, unspecified, uncomplicated: Secondary | ICD-10-CM

## 2018-10-15 DIAGNOSIS — R1084 Generalized abdominal pain: Secondary | ICD-10-CM

## 2018-10-15 DIAGNOSIS — I5030 Unspecified diastolic (congestive) heart failure: Secondary | ICD-10-CM

## 2018-10-15 DIAGNOSIS — N186 End stage renal disease: Secondary | ICD-10-CM

## 2018-10-15 LAB — HEMOGLOBIN A1C
HEMOGLOBIN A1C: 7 % — AB (ref 4.8–5.6)
MEAN PLASMA GLUCOSE: 154.2 mg/dL

## 2018-10-15 LAB — ECHOCARDIOGRAM COMPLETE
HEIGHTINCHES: 66 in
WEIGHTICAEL: 2606.72 [oz_av]

## 2018-10-15 LAB — GLUCOSE, CAPILLARY
GLUCOSE-CAPILLARY: 277 mg/dL — AB (ref 70–99)
GLUCOSE-CAPILLARY: 154 mg/dL — AB (ref 70–99)
Glucose-Capillary: 94 mg/dL (ref 70–99)
Glucose-Capillary: 82 mg/dL (ref 70–99)

## 2018-10-15 LAB — TROPONIN I
TROPONIN I: 0.2 ng/mL — AB (ref <0.03)
Troponin I: 0.26 ng/mL (ref <0.03)

## 2018-10-15 LAB — PHOSPHORUS: PHOSPHORUS: 4.6 mg/dL (ref 2.5–4.6)

## 2018-10-15 LAB — MRSA PCR SCREENING: MRSA by PCR: POSITIVE — AB

## 2018-10-15 LAB — MAGNESIUM: MAGNESIUM: 2.2 mg/dL (ref 1.7–2.4)

## 2018-10-15 MED ORDER — HEPARIN BOLUS VIA INFUSION
3000.0000 [IU] | Freq: Once | INTRAVENOUS | Status: AC
  Administered 2018-10-15: 3000 [IU] via INTRAVENOUS
  Filled 2018-10-15: qty 3000

## 2018-10-15 MED ORDER — MUPIROCIN 2 % EX OINT
1.0000 "application " | TOPICAL_OINTMENT | Freq: Two times a day (BID) | CUTANEOUS | Status: DC
  Administered 2018-10-15 – 2018-10-19 (×9): 1 via NASAL
  Filled 2018-10-15 (×3): qty 22

## 2018-10-15 MED ORDER — HEPARIN (PORCINE) 25000 UT/250ML-% IV SOLN
900.0000 [IU]/h | INTRAVENOUS | Status: DC
  Administered 2018-10-15: 900 [IU]/h via INTRAVENOUS
  Filled 2018-10-15: qty 250

## 2018-10-15 MED ORDER — ACETAMINOPHEN 325 MG PO TABS
650.0000 mg | ORAL_TABLET | Freq: Four times a day (QID) | ORAL | Status: DC | PRN
  Administered 2018-10-15 – 2018-10-24 (×9): 650 mg via ORAL
  Filled 2018-10-15 (×10): qty 2

## 2018-10-15 MED ORDER — IOPAMIDOL (ISOVUE-370) INJECTION 76%
INTRAVENOUS | Status: AC
  Filled 2018-10-15: qty 100

## 2018-10-15 MED ORDER — PERFLUTREN LIPID MICROSPHERE
1.0000 mL | INTRAVENOUS | Status: AC | PRN
  Administered 2018-10-15: 2 mL via INTRAVENOUS
  Filled 2018-10-15 (×2): qty 10

## 2018-10-15 MED ORDER — ALUM & MAG HYDROXIDE-SIMETH 200-200-20 MG/5ML PO SUSP: 15.0000 mL | Freq: Four times a day (QID) | ORAL | Status: DC | PRN

## 2018-10-15 MED ORDER — CHLORHEXIDINE GLUCONATE CLOTH 2 % EX PADS
6.0000 | MEDICATED_PAD | Freq: Every day | CUTANEOUS | Status: AC
  Administered 2018-10-15 – 2018-10-18 (×4): 6 via TOPICAL

## 2018-10-15 MED ORDER — IOPAMIDOL (ISOVUE-370) INJECTION 76%
100.0000 mL | Freq: Once | INTRAVENOUS | Status: AC | PRN
  Administered 2018-10-15: 100 mL via INTRAVENOUS

## 2018-10-15 MED ORDER — INSULIN ASPART 100 UNIT/ML ~~LOC~~ SOLN
0.0000 [IU] | Freq: Three times a day (TID) | SUBCUTANEOUS | Status: DC
  Administered 2018-10-16: 2 [IU] via SUBCUTANEOUS
  Administered 2018-10-17 – 2018-10-19 (×3): 1 [IU] via SUBCUTANEOUS
  Administered 2018-10-20 – 2018-10-23 (×3): 2 [IU] via SUBCUTANEOUS
  Administered 2018-10-23: 3 [IU] via SUBCUTANEOUS
  Administered 2018-10-24: 2 [IU] via SUBCUTANEOUS
  Administered 2018-10-24 – 2018-10-25 (×2): 1 [IU] via SUBCUTANEOUS
  Administered 2018-10-25: 2 [IU] via SUBCUTANEOUS

## 2018-10-15 MED ORDER — HEPARIN SODIUM (PORCINE) 5000 UNIT/ML IJ SOLN
5000.0000 [IU] | Freq: Three times a day (TID) | INTRAMUSCULAR | Status: DC
  Administered 2018-10-15 – 2018-10-25 (×30): 5000 [IU] via SUBCUTANEOUS
  Filled 2018-10-15 (×30): qty 1

## 2018-10-15 NOTE — Progress Notes (Addendum)
Progress Note  Patient Name: Fernando Jones Date of Encounter: 10/15/2018  Primary Cardiologist: New to [facility] (Lives in [place])  Subjective   Complains of abdominal pain, thoracic pain bilaterally, neck and shoulder pain, and bilateral arm pain.  Inpatient Medications    Scheduled Meds: . aspirin EC  81 mg Oral Daily  . Chlorhexidine Gluconate Cloth  6 each Topical Q0600  . insulin aspart  0-9 Units Subcutaneous Q4H  . [START ON 10/17/2018] midodrine  10 mg Oral Q T,Th,Sa-HD  . mupirocin ointment  1 application Nasal BID  . pantoprazole  40 mg Oral BID AC  . rOPINIRole  0.25-0.5 mg Oral QHS   Continuous Infusions: . heparin 900 Units/hr (10/15/18 0805)   PRN Meds: acetaminophen, alum & mag hydroxide-simeth, morphine injection, ondansetron (ZOFRAN) IV, oxyCODONE   Vital Signs    Vitals:   10/14/18 2330 10/14/18 2356 10/15/18 0404 10/15/18 0744  BP: 106/68 121/72 (!) 87/44 (!) 85/59  Pulse: (!) 103 (!) 111 95 (!) 108  Resp: (!) 21 (!) 29 15 16   Temp:  98.3 F (36.8 C) 99 F (37.2 C) 98.7 F (37.1 C)  TempSrc:  Oral Oral Oral  SpO2: 100% 96% 100% 98%  Weight:  73.9 kg    Height:  5\' 6"  (1.676 m)      Intake/Output Summary (Last 24 hours) at 10/15/2018 1045 Last data filed at 10/15/2018 0909 Gross per 24 hour  Intake 860 ml  Output -  Net 860 ml   Filed Weights   10/14/18 1738 10/14/18 2356  Weight: 70 kg 73.9 kg    Telemetry    Sinus rhythm and sinus tachycardia.  Personally reviewed.  ECG    Tracing from 10/15/2018 shows sinus rhythm with LVH and repolarization changes.  Personally reviewed.  Physical Exam   GEN:  Appears uncomfortable, but no acute distress.   Neck: No JVD. Cardiac: RRR, no gallop.  Respiratory: Nonlabored. Clear to auscultation bilaterally. GI: Soft, nontender, bowel sounds present. MS: No edema; No deformity. Neuro:  Nonfocal. Psych: Alert and oriented x 3. Normal affect.  Labs    Chemistry Recent Labs  Lab  10/14/18 1920 10/14/18 1946 10/14/18 1952  NA 136 137 138  K 3.7 3.8 4.0  CL 94* 95* 94*  CO2 28  --  30  GLUCOSE 138* 133* 120*  BUN 25* 27* 25*  CREATININE 6.13* 6.20* 6.20*  CALCIUM 9.2  --  9.4  PROT  --   --  7.8  ALBUMIN  --   --  3.7  AST  --   --  20  ALT  --   --  27  ALKPHOS  --   --  57  BILITOT  --   --  0.8  GFRNONAA 9*  --  9*  GFRAA 10*  --  10*  ANIONGAP 14  --  14     Hematology Recent Labs  Lab 10/14/18 1746 10/14/18 1758 10/14/18 1920 10/14/18 1946  WBC 4.9  --  15.5*  --   RBC 1.26*  --  4.26  --   HGB 3.8* 16.0 12.7* 14.6  HCT 13.1* 47.0 40.2 43.0  MCV 104.0*  --  94.4  --   MCH 30.2  --  29.8  --   MCHC 29.0*  --  31.6  --   RDW 15.9*  --  16.0*  --   PLT 86*  --  290  --     Cardiac Enzymes Recent Labs  Lab 10/14/18 1952 10/14/18 2229 10/15/18 0431 10/15/18 0917  TROPONINI <0.03 0.15* 0.26* 0.20*    Recent Labs  Lab 10/14/18 1758 10/14/18 1927  TROPIPOC 0.10* 0.10*     Radiology    Dg Chest 2 View  Result Date: 10/14/2018 CLINICAL DATA:  Sudden onset chest pain and shortness of breath, pain radiating to neck. EXAM: CHEST - 2 VIEW COMPARISON:  Chest x-ray dated 06/30/2018. FINDINGS: Heart size and mediastinal contours are within normal limits. Atherosclerotic changes noted at the aortic arch. Lungs are clear. No pleural effusion or pneumothorax seen. Osseous structures about the chest are unremarkable. IMPRESSION: No active cardiopulmonary disease. No evidence of pneumonia or pulmonary edema. Electronically Signed   By: Franki Cabot M.D.   On: 10/14/2018 19:06    Cardiac Studies   Echocardiogram pending.  Patient Profile     57 y.o. male with a history of hypertension, hyperlipidemia, type 2 diabetes mellitus, and ESRD on hemodialysis.  He presents with fairly diffuse pain in the abdomen, thorax, neck and shoulders, also arms.  No obvious precipitant.  He reports compliance with hemodialysis and his medications.  Troponin I  levels are mildly increased and cardiology is consulted for further evaluation.  Assessment & Plan    1.  Abnormal troponin I levels, 0.15, 0.26, and 0.20.  Relatively nonspecific pattern particularly in light of ESRD.  Symptoms are also fairly atypical for a clear-cut ischemic event.  ECG shows LVH with repolarization of normalities that are old.  2.  Essential hypertension by history, recent systolic blood pressure ranging from the 80s up to 120s.  3.  History of hypotension with hemodialysis, on ProAmatine.  4.  ESRD on hemodialysis.  Patient seen in consultation by cardiology fellow.  Presentation is unusual for ischemia, and other etiologies need to be considered.  Troponin I trend is not classic for ACS.  We will follow-up on echocardiogram to evaluate cardiac structure and function.  Would hold off on heparin infusion until other etiologies have been assessed.  Would consider CT imaging of the abdomen and chest per primary team.  Cardiology will continue to follow and can make further ischemic testing recommendations depending on clinical course.  Signed, Rozann Lesches, MD  10/15/2018, 10:45 AM

## 2018-10-15 NOTE — Progress Notes (Signed)
[place] TEAM [place]  HQI:[id number] DOB: 18-Oct-1961 DOA: 10/14/2018 PCP: Patient, No Pcp Per    Brief Narrative:  57 y.o. male with a hx of ESRD on HD (T/Th/Sat), DM, HTN, GERD, tobacco abuse, and HLD who presented w/ L chest pain and total body aches.  Subjective: Pt reports ongoing chest and abdom pain, which has only minimally improved since his admit. He denies sob, n/v, or HA.   Assessment & Plan:  Atypical chest pain Sx are now c/w USAP - proximity to HD and unusual character of pain raise some concern for a vasculare event - check CTa chest and abdom   ESRD on HD T/Th/Sat Will need to alert Nephrology for HD if pt remains hospitalized until Tues   HTN Not an active issue at this time w/ pt actually modestly hypotensive  DM2 A1c 7.0 - CBG quite variable - adjust tx and follow trend   Tobacco abuse  Counsel on need to stop smoking completely   MRSA screen +  DVT prophylaxis: SQ heparin  Code Status: FULL CODE Family Communication: no family present at time of exam  Disposition Plan: safe for tele bed   Consultants:  Cardiology  Antimicrobials:  none   Objective: Blood pressure (!) 86/60, pulse 88, temperature 98.7 F (37.1 C), temperature source Oral, resp. rate (!) 28, height 5\' 6"  (1.676 m), weight 73.9 kg, SpO2 100 %.  Intake/Output Summary (Last 24 hours) at 10/15/2018 1418 Last data filed at 10/15/2018 0909 Gross per 24 hour  Intake 1080 ml  Output -  Net 1080 ml   Filed Weights   10/14/18 1738 10/14/18 2356  Weight: 70 kg 73.9 kg    Examination: General: No acute respiratory distress Lungs: Clear to auscultation bilaterally without wheezes or crackles Cardiovascular: Regular rate and rhythm without murmur gallop or rub normal S1 and S2 Abdomen: Nontender, nondistended, soft, bowel sounds positive, no rebound, no ascites, no appreciable mass Extremities: No significant cyanosis, clubbing, or edema  bilateral lower extremities  CBC: Recent Labs  Lab 10/14/18 1746 10/14/18 1758 10/14/18 1920 10/14/18 1946  WBC 4.9  --  15.5*  --   NEUTROABS 4.3  --  13.7*  --   HGB 3.8* 16.0 12.7* 14.6  HCT 13.1* 47.0 40.2 43.0  MCV 104.0*  --  94.4  --   PLT 86*  --  290  --    Basic Metabolic Panel: Recent Labs  Lab 10/14/18 1920 10/14/18 1946 10/14/18 1952 10/15/18 0046  NA 136 137 138  --   K 3.7 3.8 4.0  --   CL 94* 95* 94*  --   CO2 28  --  30  --   GLUCOSE 138* 133* 120*  --   BUN 25* 27* 25*  --   CREATININE 6.13* 6.20* 6.20*  --   CALCIUM 9.2  --  9.4  --   MG  --   --   --  2.2  PHOS  --   --   --  4.6   GFR: Estimated Creatinine Clearance: 11.7 mL/min (A) (by C-G formula based on SCr of 6.2 mg/dL (H)).  Liver Function Tests: Recent Labs  Lab 10/14/18 1952  AST 20  ALT 27  ALKPHOS 57  BILITOT 0.8  PROT 7.8  ALBUMIN 3.7   Recent Labs  Lab 10/14/18 1952  LIPASE 29    Cardiac Enzymes: Recent Labs  Lab 10/14/18 1952 10/14/18 2229 10/15/18 0431 10/15/18 1324  TROPONINI <0.03 0.15* 0.26* 0.20*    HbA1C: Hgb A1c MFr Bld  Date/Time Value Ref Range Status  10/15/2018 12:46 AM 7.0 (H) 4.8 - 5.6 % Final    Comment:    (NOTE) Pre diabetes:          5.7%-6.4% Diabetes:              >6.4% Glycemic control for   <7.0% adults with diabetes   12/06/2017 09:02 PM 4.6 (L) 4.8 - 5.6 % Final    Comment:    (NOTE) Pre diabetes:          5.7%-6.4% Diabetes:              >6.4% Glycemic control for   <7.0% adults with diabetes     CBG: Recent Labs  Lab 10/14/18 2257 10/15/18 0403 10/15/18 1131  GLUCAP 84 94 277*    Recent Results (from the past 240 hour(s))  MRSA PCR Screening     Status: Abnormal   Collection Time: 10/15/18 12:54 AM  Result Value Ref Range Status   MRSA by PCR POSITIVE (A) NEGATIVE Final    Comment: RESULT CALLED TO, READ BACK BY AND VERIFIED WITH: RN TIM IRBY [id number] @0520  THANEY        The GeneXpert MRSA Assay (FDA approved  for NASAL specimens only), is one component of a comprehensive MRSA colonization surveillance program. It is not intended to diagnose MRSA infection nor to guide or monitor treatment for MRSA infections. Performed at [facility], [place] [address], [place], [place] [address]      Scheduled Meds: . aspirin EC  81 mg Oral Daily  . Chlorhexidine Gluconate Cloth  6 each Topical Q0600  . insulin aspart  0-9 Units Subcutaneous Q4H  . [START ON 10/17/2018] midodrine  10 mg Oral Q T,Th,Sa-HD  . mupirocin ointment  1 application Nasal BID  . pantoprazole  40 mg Oral BID AC  . rOPINIRole  0.25-0.5 mg Oral QHS     LOS: 0 days   Cherene Altes, MD [facility] Office  [phone] Pager - Text Page per Amion  If 7PM-7AM, please contact night-coverage per Amion 10/15/2018, 2:18 PM

## 2018-10-15 NOTE — Progress Notes (Signed)
  Echocardiogram 2D Echocardiogram has been performed.  Fernando Jones 10/15/2018, 11:27 AM

## 2018-10-15 NOTE — Progress Notes (Signed)
ANTICOAGULATION CONSULT NOTE - Initial Consult  Pharmacy Consult for Heparin Indication: chest pain/ACS  No Known Allergies  Patient Measurements: Height: 5\' 6"  (167.6 cm) Weight: 162 lb 14.7 oz (73.9 kg) IBW/kg (Calculated) : 63.8   Vital Signs: Temp: 99 F (37.2 C) (11/10 0404) Temp Source: Oral (11/10 0404) BP: 87/44 (11/10 0404) Pulse Rate: 95 (11/10 0404)  Labs: Recent Labs    10/14/18 1746  10/14/18 1758 10/14/18 1920 10/14/18 1946 10/14/18 1952 10/14/18 2229 10/15/18 0431  HGB 3.8*  --  16.0 12.7* 14.6  --   --   --   HCT 13.1*  --  47.0 40.2 43.0  --   --   --   PLT 86*  --   --  290  --   --   --   --   CREATININE  --    < > 5.80* 6.13* 6.20* 6.20*  --   --   TROPONINI  --   --   --   --   --  <0.03 0.15* 0.26*   < > = values in this interval not displayed.    Estimated Creatinine Clearance: 11.7 mL/min (A) (by C-G formula based on SCr of 6.2 mg/dL (H)).   Medical History: Past Medical History:  Diagnosis Date  . Erectile dysfunction 2012  . ESRD (end stage renal disease) ([facility])    w/Left ureteral stone/hydronephrosis/notes 12/06/2017  . Hepatitis 1973   "? kind"  . Hyperlipidemia 2012  . Hypertension   . Tobacco abuse 2012   1/2 pack per day  . Type II diabetes mellitus ([facility])     Medications:  Medications Prior to Admission  Medication Sig Dispense Refill Last Dose  . aspirin EC 81 MG EC tablet Take 1 tablet (81 mg total) by mouth daily. 30 tablet 1 Past Month at Unknown time  . camphor-menthol (SARNA) lotion Apply 1 application topically every 8 (eight) hours as needed for itching. 222 mL 0 unk at prn  . carvedilol (COREG) 12.5 MG tablet Take 1 tablet (12.5 mg total) by mouth 2 (two) times daily with a meal. 60 tablet 1 Past Month at Unknown time  . Darbepoetin Alfa (ARANESP) 200 MCG/0.4ML SOSY injection 256mcg to be given with dialysis every 14 days 1.68 mL 0 10/01/2018  . ferric citrate (AURYXIA) 1 GM 210 MG(Fe) tablet Take 1 tablet (210 mg  total) by mouth 3 (three) times daily with meals. 270 tablet 0 Past Month at Unknown time  . hydrALAZINE (APRESOLINE) 25 MG tablet Take 1 tablet (25 mg total) by mouth every 8 (eight) hours. 90 tablet 0 Past Month at Unknown time  . losartan (COZAAR) 50 MG tablet Take 50 mg by mouth daily.  3 Past Month at Unknown time  . midodrine (PROAMATINE) 10 MG tablet Take 10 mg by mouth See admin instructions. Take 1 tablet (10 mg totally) by mouth three times a week with dialysis  3 Past Month at Unknown time  . mupirocin ointment (BACTROBAN) 2 % Place 1 application into the nose 2 (two) times daily. (Patient taking differently: Place 1 application into the nose 2 (two) times daily as needed (for skin rash). ) 22 g 0 unk at prn  . Nutritional Supplements (FEEDING SUPPLEMENT, NEPRO CARB STEADY,) LIQD Take 237 mLs by mouth 2 (two) times daily between meals. 60 Can 1 Past Week at Unknown time  . ondansetron (ZOFRAN) 4 MG tablet Take 1 tablet (4 mg total) by mouth every 6 (six) hours as needed  for nausea. 20 tablet 0 unk at prn  . oxyCODONE (ROXICODONE) 5 MG immediate release tablet Take 1 tablet (5 mg total) by mouth every 4 (four) hours as needed for severe pain. 12 tablet 0 unk at prn  . pantoprazole (PROTONIX) 40 MG tablet Take 1 tablet (40 mg total) by mouth 2 (two) times daily before a meal. 60 tablet 0 Past Month at Unknown time  . ranitidine (ZANTAC) 150 MG tablet Take 150 mg by mouth daily.  3 Past Month at Unknown time  . rOPINIRole (REQUIP) 0.25 MG tablet Take 0.25-0.5 mg by mouth at bedtime.  3 Past Month at Unknown time  . tamsulosin (FLOMAX) 0.4 MG CAPS capsule Take 1 capsule (0.4 mg total) by mouth daily. 30 capsule 0 Past Month at Unknown time  . nicotine (NICODERM CQ - DOSED IN MG/24 HOURS) 21 mg/24hr patch Place 1 patch (21 mg total) onto the skin daily. (Patient not taking: Reported on 10/14/2018) 28 patch 0 Not Taking at Unknown time  . polyethylene glycol (MIRALAX / GLYCOLAX) packet Take 17 g by  mouth daily. (Patient not taking: Reported on 10/14/2018) 14 each 0 Not Taking at Unknown time  . senna-docusate (SENOKOT-S) 8.6-50 MG tablet Take 1 tablet by mouth 2 (two) times daily. (Patient not taking: Reported on 10/14/2018) 60 tablet 0 Not Taking at Unknown time    Assessment: 57 y.o. male with chest pain and elevated cardiac markers for heparin  Goal of Therapy:  Heparin level 0.3-0.7 units/ml Monitor platelets by anticoagulation protocol: Yes   Plan:  Heparin 3000 units IV bolus, then start heparin 900 units/hr Check heparin level in 8 hours.   Caryl Pina 10/15/2018,7:02 AM

## 2018-10-15 NOTE — Progress Notes (Signed)
Pt. Received 3000 units bolus of Heparin followed by 9 ml/hr continuous per order [facility]

## 2018-10-16 DIAGNOSIS — I10 Essential (primary) hypertension: Secondary | ICD-10-CM

## 2018-10-16 DIAGNOSIS — R079 Chest pain, unspecified: Secondary | ICD-10-CM

## 2018-10-16 LAB — COMPREHENSIVE METABOLIC PANEL
ALBUMIN: 3 g/dL — AB (ref 3.5–5.0)
ALK PHOS: 60 U/L (ref 38–126)
ALT: 19 U/L (ref 0–44)
ANION GAP: 15 (ref 5–15)
AST: 14 U/L — AB (ref 15–41)
BILIRUBIN TOTAL: 0.6 mg/dL (ref 0.3–1.2)
BUN: 55 mg/dL — AB (ref 6–20)
CALCIUM: 9 mg/dL (ref 8.9–10.3)
CO2: 25 mmol/L (ref 22–32)
CREATININE: 9.95 mg/dL — AB (ref 0.61–1.24)
Chloride: 93 mmol/L — ABNORMAL LOW (ref 98–111)
GFR calc Af Amer: 6 mL/min — ABNORMAL LOW (ref >60)
GFR calc non Af Amer: 5 mL/min — ABNORMAL LOW (ref >60)
Glucose, Bld: 114 mg/dL — ABNORMAL HIGH (ref 70–99)
Potassium: 5.2 mmol/L — ABNORMAL HIGH (ref 3.5–5.1)
Sodium: 133 mmol/L — ABNORMAL LOW (ref 135–145)
TOTAL PROTEIN: 6.5 g/dL (ref 6.5–8.1)

## 2018-10-16 LAB — GLUCOSE, CAPILLARY
GLUCOSE-CAPILLARY: 117 mg/dL — AB (ref 70–99)
GLUCOSE-CAPILLARY: 170 mg/dL — AB (ref 70–99)
GLUCOSE-CAPILLARY: 87 mg/dL (ref 70–99)
Glucose-Capillary: 109 mg/dL — ABNORMAL HIGH (ref 70–99)
Glucose-Capillary: 160 mg/dL — ABNORMAL HIGH (ref 70–99)
Glucose-Capillary: 89 mg/dL (ref 70–99)

## 2018-10-16 LAB — CBC
HEMATOCRIT: 35.5 % — AB (ref 39.0–52.0)
HEMOGLOBIN: 11 g/dL — AB (ref 13.0–17.0)
MCH: 29.3 pg (ref 26.0–34.0)
MCHC: 31 g/dL (ref 30.0–36.0)
MCV: 94.4 fL (ref 80.0–100.0)
Platelets: 311 10*3/uL (ref 150–400)
RBC: 3.76 MIL/uL — ABNORMAL LOW (ref 4.22–5.81)
RDW: 15.9 % — ABNORMAL HIGH (ref 11.5–15.5)
WBC: 13.8 10*3/uL — AB (ref 4.0–10.5)
nRBC: 0 % (ref 0.0–0.2)

## 2018-10-16 MED ORDER — SODIUM CHLORIDE 0.9% FLUSH: 3.0000 mL | INTRAVENOUS | Status: DC | PRN

## 2018-10-16 MED ORDER — SODIUM CHLORIDE 0.9% FLUSH
3.0000 mL | Freq: Two times a day (BID) | INTRAVENOUS | Status: DC
  Administered 2018-10-16: 3 mL via INTRAVENOUS

## 2018-10-16 MED ORDER — METOPROLOL TARTRATE 25 MG PO TABS
25.0000 mg | ORAL_TABLET | Freq: Once | ORAL | Status: AC
  Administered 2018-10-16: 25 mg via ORAL
  Filled 2018-10-16: qty 1

## 2018-10-16 MED ORDER — SODIUM CHLORIDE 0.9 % IV SOLN
INTRAVENOUS | Status: DC
  Administered 2018-10-17: 06:00:00 via INTRAVENOUS

## 2018-10-16 MED ORDER — ASPIRIN 81 MG PO CHEW
81.0000 mg | CHEWABLE_TABLET | ORAL | Status: AC
  Administered 2018-10-17: 81 mg via ORAL
  Filled 2018-10-16: qty 1

## 2018-10-16 MED ORDER — DOXERCALCIFEROL 4 MCG/2ML IV SOLN
3.0000 ug | INTRAVENOUS | Status: DC
  Administered 2018-10-17 – 2018-10-24 (×4): 3 ug via INTRAVENOUS
  Filled 2018-10-16 (×4): qty 2

## 2018-10-16 MED ORDER — SODIUM CHLORIDE 0.9 % IV SOLN: 250.0000 mL | INTRAVENOUS | Status: DC | PRN

## 2018-10-16 NOTE — H&P (View-Only) (Signed)
Progress Note  Patient Name: Fernando Jones Date of Encounter: 10/16/2018  Primary Cardiologist: No primary care provider on file. new DR. Turner   Subjective   Pain, abd chest and Rt arm and into neck.  HR ST to 110  Inpatient Medications    Scheduled Meds: . aspirin EC  81 mg Oral Daily  . Chlorhexidine Gluconate Cloth  6 each Topical Q0600  . heparin injection (subcutaneous)  5,000 Units Subcutaneous Q8H  . insulin aspart  0-9 Units Subcutaneous TID WC  . [START ON 10/17/2018] midodrine  10 mg Oral Q T,Th,Sa-HD  . mupirocin ointment  1 application Nasal BID  . pantoprazole  40 mg Oral BID AC  . rOPINIRole  0.25-0.5 mg Oral QHS   Continuous Infusions:  PRN Meds: acetaminophen, alum & mag hydroxide-simeth, ondansetron (ZOFRAN) IV, oxyCODONE   Vital Signs    Vitals:   10/15/18 1135 10/15/18 2034 10/16/18 0410 10/16/18 0835  BP: (!) 86/60 116/60 (!) 110/53 138/62  Pulse: 88 88 83   Resp: (!) 28 16 13    Temp:  99.2 F (37.3 C) 98.5 F (36.9 C) 98.5 F (36.9 C)  TempSrc:  Oral Oral Oral  SpO2: 100% 99% 96%   Weight:      Height:        Intake/Output Summary (Last 24 hours) at 10/16/2018 1003 Last data filed at 10/15/2018 2000 Gross per 24 hour  Intake 240 ml  Output 1 ml  Net 239 ml   Filed Weights   10/14/18 1738 10/14/18 2356  Weight: 70 kg 73.9 kg    Telemetry    SR to ST - Personally Reviewed  ECG    No new - Personally Reviewed  Physical Exam   GEN: was sleeping then in pain abd and chest..   Neck: No JVD Cardiac: RRR, no murmurs, rubs, or gallops. Chest wall tender to touch and abd. Respiratory: Clear to auscultation bilaterally. GI: Soft, nontender, non-distended  MS: No edema; No deformity. Neuro:  Nonfocal  Psych: Normal affect   Labs    Chemistry Recent Labs  Lab 10/14/18 1920 10/14/18 1946 10/14/18 1952 10/16/18 0308  NA 136 137 138 133*  K 3.7 3.8 4.0 5.2*  CL 94* 95* 94* 93*  CO2 28  --  30 25  GLUCOSE 138*  133* 120* 114*  BUN 25* 27* 25* 55*  CREATININE 6.13* 6.20* 6.20* 9.95*  CALCIUM 9.2  --  9.4 9.0  PROT  --   --  7.8 6.5  ALBUMIN  --   --  3.7 3.0*  AST  --   --  20 14*  ALT  --   --  27 19  ALKPHOS  --   --  57 60  BILITOT  --   --  0.8 0.6  GFRNONAA 9*  --  9* 5*  GFRAA 10*  --  10* 6*  ANIONGAP 14  --  14 15     Hematology Recent Labs  Lab 10/14/18 1746  10/14/18 1920 10/14/18 1946 10/16/18 0308  WBC 4.9  --  15.5*  --  13.8*  RBC 1.26*  --  4.26  --  3.76*  HGB 3.8*   < > 12.7* 14.6 11.0*  HCT 13.1*   < > 40.2 43.0 35.5*  MCV 104.0*  --  94.4  --  94.4  MCH 30.2  --  29.8  --  29.3  MCHC 29.0*  --  31.6  --  31.0  RDW  15.9*  --  16.0*  --  15.9*  PLT 86*  --  290  --  311   < > = values in this interval not displayed.    Cardiac Enzymes Recent Labs  Lab 10/14/18 1952 10/14/18 2229 10/15/18 0431 10/15/18 0917  TROPONINI <0.03 0.15* 0.26* 0.20*    Recent Labs  Lab 10/14/18 1758 10/14/18 1927  TROPIPOC 0.10* 0.10*     BNPNo results for input(s): BNP, PROBNP in the last 168 hours.   DDimer No results for input(s): DDIMER in the last 168 hours.   Radiology    Dg Chest 2 View  Result Date: 10/14/2018 CLINICAL DATA:  Sudden onset chest pain and shortness of breath, pain radiating to neck. EXAM: CHEST - 2 VIEW COMPARISON:  Chest x-ray dated 06/30/2018. FINDINGS: Heart size and mediastinal contours are within normal limits. Atherosclerotic changes noted at the aortic arch. Lungs are clear. No pleural effusion or pneumothorax seen. Osseous structures about the chest are unremarkable. IMPRESSION: No active cardiopulmonary disease. No evidence of pneumonia or pulmonary edema. Electronically Signed   By: Franki Cabot M.D.   On: 10/14/2018 19:06   Ct Angio Chest Aorta W/cm &/or Wo/cm  Result Date: 10/15/2018 CLINICAL DATA:  Chest and abdominal pain. EXAM: CT ANGIOGRAPHY CHEST, ABDOMEN AND PELVIS TECHNIQUE: Multidetector CT imaging through the chest, abdomen  and pelvis was performed using the standard protocol during bolus administration of intravenous contrast. Multiplanar reconstructed images and MIPs were obtained and reviewed to evaluate the vascular anatomy. CONTRAST:  122mL ISOVUE-370 IOPAMIDOL (ISOVUE-370) INJECTION 76% COMPARISON:  CT scan of December 06, 2017. FINDINGS: CTA CHEST FINDINGS Cardiovascular: Atherosclerosis of thoracic aorta is noted without aneurysm or dissection. Normal cardiac size. No pericardial effusion. Coronary artery calcifications are noted. Mediastinum/Nodes: No enlarged mediastinal, hilar, or axillary lymph nodes. Thyroid gland, trachea, and esophagus demonstrate no significant findings. Lungs/Pleura: No pneumothorax or pleural effusion is noted. Left lung is clear. 7 mm nodule is noted in right lung base best seen on image number 116 of series 6. Musculoskeletal: No chest wall abnormality. No acute or significant osseous findings. Review of the MIP images confirms the above findings. CTA ABDOMEN AND PELVIS FINDINGS VASCULAR Aorta: Atherosclerosis of abdominal aorta is noted without aneurysm or dissection. Celiac: Patent without evidence of aneurysm, dissection, vasculitis or significant stenosis. SMA: Patent without evidence of aneurysm, dissection, vasculitis or significant stenosis. Renals: Moderate to severe atheromatous disease is seen diffusely involving both renal arteries. IMA: Patent without evidence of aneurysm, dissection, vasculitis or significant stenosis. Inflow: Moderate eccentric stenosis is seen involving the proximal left common iliac artery. Multifocal moderate stenoses are noted throughout both external iliac arteries. Veins: No obvious venous abnormality within the limitations of this arterial phase study. Review of the MIP images confirms the above findings. NON-VASCULAR Hepatobiliary: No focal liver abnormality is seen. No gallstones, gallbladder wall thickening, or biliary dilatation. Pancreas: Unremarkable. No  pancreatic ductal dilatation or surrounding inflammatory changes. Spleen: Normal in size without focal abnormality. Adrenals/Urinary Tract: Adrenal glands appear normal. Bilateral renal atrophy is noted consistent with history of end-stage renal disease. No hydronephrosis or renal obstruction is noted. Urinary bladder is decompressed. Stomach/Bowel: The stomach appears normal. There is no evidence of bowel obstruction or inflammation. Status post appendectomy. Stool is noted throughout the colon. Lymphatic: No significant adenopathy is noted. Reproductive: Prostate is unremarkable. Other: No abdominal wall hernia or abnormality. No abdominopelvic ascites. Musculoskeletal: No acute or significant osseous findings. Review of the MIP images confirms the above findings.  IMPRESSION: Atherosclerosis of thoracic and abdominal aorta is noted without aneurysm or dissection. Coronary artery calcifications are noted suggesting coronary artery disease. 7 mm nodule is noted in right lung base. Non-contrast chest CT at 6-12 months is recommended. If the nodule is stable at time of repeat CT, then future CT at 18-24 months (from today's scan) is considered optional for low-risk patients, but is recommended for high-risk patients. This recommendation follows the consensus statement: Guidelines for Management of Incidental Pulmonary Nodules Detected on CT Images: From the Fleischner Society 2017; Radiology 2017; 284:228-243. No evidence of significant mesenteric artery stenosis. Moderate diffuse atheromatous disease is seen involving both renal arteries, with bilateral renal atrophy consistent with end-stage renal disease. Moderate eccentric stenosis is seen involving the proximal left common iliac artery. Multifocal moderate stenoses are noted throughout both external iliac arteries. Electronically Signed   By: Marijo Conception, M.D.   On: 10/15/2018 20:08   Ct Angio Abd/pel W/ And/or W/o  Result Date: 10/15/2018 CLINICAL DATA:   Chest and abdominal pain. EXAM: CT ANGIOGRAPHY CHEST, ABDOMEN AND PELVIS TECHNIQUE: Multidetector CT imaging through the chest, abdomen and pelvis was performed using the standard protocol during bolus administration of intravenous contrast. Multiplanar reconstructed images and MIPs were obtained and reviewed to evaluate the vascular anatomy. CONTRAST:  148mL ISOVUE-370 IOPAMIDOL (ISOVUE-370) INJECTION 76% COMPARISON:  CT scan of December 06, 2017. FINDINGS: CTA CHEST FINDINGS Cardiovascular: Atherosclerosis of thoracic aorta is noted without aneurysm or dissection. Normal cardiac size. No pericardial effusion. Coronary artery calcifications are noted. Mediastinum/Nodes: No enlarged mediastinal, hilar, or axillary lymph nodes. Thyroid gland, trachea, and esophagus demonstrate no significant findings. Lungs/Pleura: No pneumothorax or pleural effusion is noted. Left lung is clear. 7 mm nodule is noted in right lung base best seen on image number 116 of series 6. Musculoskeletal: No chest wall abnormality. No acute or significant osseous findings. Review of the MIP images confirms the above findings. CTA ABDOMEN AND PELVIS FINDINGS VASCULAR Aorta: Atherosclerosis of abdominal aorta is noted without aneurysm or dissection. Celiac: Patent without evidence of aneurysm, dissection, vasculitis or significant stenosis. SMA: Patent without evidence of aneurysm, dissection, vasculitis or significant stenosis. Renals: Moderate to severe atheromatous disease is seen diffusely involving both renal arteries. IMA: Patent without evidence of aneurysm, dissection, vasculitis or significant stenosis. Inflow: Moderate eccentric stenosis is seen involving the proximal left common iliac artery. Multifocal moderate stenoses are noted throughout both external iliac arteries. Veins: No obvious venous abnormality within the limitations of this arterial phase study. Review of the MIP images confirms the above findings. NON-VASCULAR  Hepatobiliary: No focal liver abnormality is seen. No gallstones, gallbladder wall thickening, or biliary dilatation. Pancreas: Unremarkable. No pancreatic ductal dilatation or surrounding inflammatory changes. Spleen: Normal in size without focal abnormality. Adrenals/Urinary Tract: Adrenal glands appear normal. Bilateral renal atrophy is noted consistent with history of end-stage renal disease. No hydronephrosis or renal obstruction is noted. Urinary bladder is decompressed. Stomach/Bowel: The stomach appears normal. There is no evidence of bowel obstruction or inflammation. Status post appendectomy. Stool is noted throughout the colon. Lymphatic: No significant adenopathy is noted. Reproductive: Prostate is unremarkable. Other: No abdominal wall hernia or abnormality. No abdominopelvic ascites. Musculoskeletal: No acute or significant osseous findings. Review of the MIP images confirms the above findings. IMPRESSION: Atherosclerosis of thoracic and abdominal aorta is noted without aneurysm or dissection. Coronary artery calcifications are noted suggesting coronary artery disease. 7 mm nodule is noted in right lung base. Non-contrast chest CT at 6-12 months is recommended.  If the nodule is stable at time of repeat CT, then future CT at 18-24 months (from today's scan) is considered optional for low-risk patients, but is recommended for high-risk patients. This recommendation follows the consensus statement: Guidelines for Management of Incidental Pulmonary Nodules Detected on CT Images: From the Fleischner Society 2017; Radiology 2017; 284:228-243. No evidence of significant mesenteric artery stenosis. Moderate diffuse atheromatous disease is seen involving both renal arteries, with bilateral renal atrophy consistent with end-stage renal disease. Moderate eccentric stenosis is seen involving the proximal left common iliac artery. Multifocal moderate stenoses are noted throughout both external iliac arteries.  Electronically Signed   By: Marijo Conception, M.D.   On: 10/15/2018 20:08    Cardiac Studies   Echo 10/15/18 Study Conclusions  - Left ventricle: The cavity size was normal. Wall thickness was   increased in a pattern of mild LVH. Systolic function was normal.   The estimated ejection fraction was in the range of 60% to 65%.   Wall motion was normal; there were no regional wall motion   abnormalities. Doppler parameters are consistent with abnormal   left ventricular relaxation (grade 1 diastolic dysfunction). - Aortic valve: Mildly calcified annulus. Trileaflet; mildly   calcified leaflets. - Mitral valve: Mildly calcified annulus. There was trivial   regurgitation. - Right atrium: Central venous pressure (est): 3 mm Hg. - Tricuspid valve: There was trivial regurgitation. - Pulmonary arteries: Systolic pressure could not be accurately   estimated. - Pericardium, extracardiac: A small pericardial effusion was   identified circumferential to the heart with signs of   organization versus chronicity.   Patient Profile     57 y.o. male with a history of hypertension, hyperlipidemia, type 2 diabetes mellitus, and ESRD on hemodialysis.  He presents with fairly diffuse pain in the abdomen, thorax, neck and shoulders, also arms.  No obvious precipitant.  He reports compliance with hemodialysis and his medications.  Troponin I levels are mildly increased and cardiology is consulted for further evaluation  Assessment & Plan    Abnormal troponin with pk troponin 0.26 -- Echo with normal EF and normal wall motion.   ? nuc  With DM  Though this is more atypical pain.    HTN -  BP now 138/62 and 110/53   Hx of hypotension on HD.  On proAmiatine  ESRD on HD.        For questions or updates, please contact [place] Please consult [email or web address] for contact info under        Signed, Cecilie Kicks, NP  10/16/2018, 10:03 AM

## 2018-10-16 NOTE — Consult Note (Addendum)
[facility] Renal Consultation Note  Indication for Consultation:  Management of ESRD/hemodialysis; anemia, hypertension/volume and secondary hyperparathyroidism  HPI: Fernando Jones is a 57 y.o. male with ESRD 2/2 DM/HTN , started HD @ [facility] 12/2017,DM type 2 ,Depression, Nephrolithiasis with h/o mild lt hydro from ureteral stone w/ cystoscopy + lt ureteroscopy, laser lithotripsy and lt ureteral stent w/ string 12/08/2017 now admitted with Atypical Chect Pain and Abdominal Pain . Pain started 11/09 after driving home from OP HD. He denies fevers, chills,does have some Nausea ,no vomiting .At OP hd 11/09  Noted pre hd  bp 114/51 and pulled below edw with BP 88/47 post Hd and earlier in week edw was increased .   Noted Cardiology seeing in consult.  Trop 0.2 , K 5.2 ,  hgb 11.0 , Lipase 29, wbc 13.8   CXR = NAD CT Chest /Abd= Atherosclerosis of thoracic and abdominal aorta is noted without aneurysm or dissection.7 mm nodule is noted in right lung base. Non-contrast chest CT at 6-12 months is recommended No evidence of significant mesenteric artery stenosis.       Past Medical History:  Diagnosis Date  . Erectile dysfunction 2012  . ESRD (end stage renal disease) ([place])    w/Left ureteral stone/hydronephrosis/notes 12/06/2017  . Hepatitis 1973   "? kind"  . Hyperlipidemia 2012  . Hypertension   . Tobacco abuse 2012   1/2 pack per day  . Type II diabetes mellitus ([place])     Past Surgical History:  Procedure Laterality Date  . APPENDECTOMY    . AV FISTULA PLACEMENT Right 12/09/2017   Procedure: ARTERIOVENOUS (AV) FISTULA CREATION;  Surgeon: Rosetta Posner, MD;  Location: [facility];  Service: Vascular;  Laterality: Right;  . CARDIAC CATHETERIZATION  05/12/2009   Archie Endo 04/07/2011  . CYSTOSCOPY/URETEROSCOPY/HOLMIUM LASER/STENT PLACEMENT Left 12/08/2017   Procedure: CYSTOSCOPY/RETROGRADE PYLEOGRAM LEFT URETEROSCOPY AND STONE EXTRACTION;  Surgeon: Festus Aloe, MD;  Location: [facility];  Service: Urology;  Laterality: Left;  . HOLMIUM LASER APPLICATION Left 12/09/3886   Procedure: HOLMIUM LASER APPLICATION;  Surgeon: Festus Aloe, MD;  Location: [facility];  Service: Urology;  Laterality: Left;  . INSERTION OF DIALYSIS CATHETER Right 12/09/2017   Procedure: EXCHANGE  OF TUNNELED  DIALYSIS CATHETER RIGHT INTERNAL JUGULAR.;  Surgeon: Rosetta Posner, MD;  Location: [facility];  Service: Vascular;  Laterality: Right;  . IR AV DIALY SHUNT INTRO NEEDLE/INTRACATH INITIAL W/PTA/IMG RIGHT Right 05/05/2018  . IR FLUORO GUIDE CV LINE RIGHT  12/07/2017  . IR REMOVAL TUN CV CATH W/O FL  06/05/2018  . IR US GUIDE VASC ACCESS RIGHT  12/07/2017  . IR US GUIDE VASC ACCESS RIGHT  05/05/2018  . THROMBECTOMY W/ EMBOLECTOMY Right 06/30/2018   Procedure: THROMBECTOMY and revision ARTERIOVENOUS FISTULA right RADIOCEPHALIC;  Surgeon: Angelia Mould, MD;  Location: [facility] OR;  Service: Vascular;  Laterality: Right;      Family History  Problem Relation Age of Onset  . Congestive Heart Failure Mother   . Diabetes Neg Hx       reports that he quit smoking about 22 months ago. His smoking use included cigarettes. He smoked 1.00 pack per day. He has never used smokeless tobacco. He reports that he does not drink alcohol or use drugs.  No Known Allergies  Prior to Admission medications   Medication Sig Start Date End Date Taking? Authorizing Provider  aspirin EC 81 MG EC tablet Take 1 tablet (81 mg total) by mouth daily. 12/17/17  Yes  Bonnell Public, MD  camphor-menthol [facility]) lotion Apply 1 application topically every 8 (eight) hours as needed for itching. 07/02/18  Yes Irene Pap N, DO  carvedilol (COREG) 12.5 MG tablet Take 1 tablet (12.5 mg total) by mouth 2 (two) times daily with a meal. 12/16/17  Yes Bonnell Public, MD  Darbepoetin Alfa (ARANESP) 200 MCG/0.4ML SOSY injection 228mg to be given with dialysis every 14 days 12/16/17  Yes OBonnell Public MD  ferric citrate (AURYXIA) 1 GM 210  MG(Fe) tablet Take 1 tablet (210 mg total) by mouth 3 (three) times daily with meals. 07/02/18  Yes HKayleen Memos DO  hydrALAZINE (APRESOLINE) 25 MG tablet Take 1 tablet (25 mg total) by mouth every 8 (eight) hours. 12/16/17  Yes ODana AllanI, MD  losartan (COZAAR) 50 MG tablet Take 50 mg by mouth daily. 05/24/18  Yes [provider]  midodrine (PROAMATINE) 10 MG tablet Take 10 mg by mouth See admin instructions. Take 1 tablet (10 mg totally) by mouth three times a week with dialysis 09/29/18  Yes [provider]  mupirocin ointment (BACTROBAN) 2 % Place 1 application into the nose 2 (two) times daily. Patient taking differently: Place 1 application into the nose 2 (two) times daily as needed (for skin rash).  07/02/18  Yes HKayleen Memos DO  Nutritional Supplements (FEEDING SUPPLEMENT, NEPRO CARB STEADY,) LIQD Take 237 mLs by mouth 2 (two) times daily between meals. 12/16/17  Yes OBonnell Public MD  ondansetron (ZOFRAN) 4 MG tablet Take 1 tablet (4 mg total) by mouth every 6 (six) hours as needed for nausea. 07/02/18  Yes HKayleen Memos DO  oxyCODONE (ROXICODONE) 5 MG immediate release tablet Take 1 tablet (5 mg total) by mouth every 4 (four) hours as needed for severe pain. 06/30/18  Yes DAngelia Mould MD  pantoprazole (PROTONIX) 40 MG tablet Take 1 tablet (40 mg total) by mouth 2 (two) times daily before a meal. 12/16/17  Yes OBonnell Public MD  ranitidine (ZANTAC) 150 MG tablet Take 150 mg by mouth daily. 05/24/18  Yes [provider]  rOPINIRole (REQUIP) 0.25 MG tablet Take 0.25-0.5 mg by mouth at bedtime. 09/29/18  Yes [provider]  tamsulosin (FLOMAX) 0.4 MG CAPS capsule Take 1 capsule (0.4 mg total) by mouth daily. 12/16/17  Yes ODana AllanI, MD     Results for orders placed or performed during the hospital encounter of 10/14/18 (from the past 48 hour(s))  CBC with Differential/Platelet     Status: Abnormal   Collection Time:  10/14/18  5:46 PM  Result Value Ref Range   WBC 4.9 4.0 - 10.5 K/uL    Comment: QUESTIONABLE RESULTS, RECOMMEND RECOLLECT TO VERIFY NOTIFIED RN J GLOSTER AT 1942 10/14/18 THAT LABS APPEAR TO BE CONTAMINATED ([place])    RBC 1.26 (L) 4.22 - 5.81 MIL/uL    Comment: QUESTIONABLE RESULTS, RECOMMEND RECOLLECT TO VERIFY   Hemoglobin 3.8 (LL) 13.0 - 17.0 g/dL    Comment: REPEATED TO VERIFY THIS CRITICAL RESULT HAS VERIFIED AND BEEN CALLED TO RN H VANKRETSCHMAR BY LESLIE BENFIELD ON 11 09 2019 AT 1849, AND HAS BEEN READ BACK.  QUESTIONABLE RESULTS, RECOMMEND RECOLLECT TO VERIFY CORRECTED ON 11/09 AT 1945: PREVIOUSLY REPORTED AS 3.8 REPEATED TO VERIFY THIS CRITICAL RESULT HAS VERIFIED AND BEEN CALLED TO RN H VANKRETSCHMAR BY LESLIE BENFIELD ON 11 09 2019 AT 1849, AND HAS BEEN READ BACK.     HCT 13.1 (L) 39.0 - 52.0 %  Comment: QUESTIONABLE RESULTS, RECOMMEND RECOLLECT TO VERIFY   MCV 104.0 (H) 80.0 - 100.0 fL    Comment: QUESTIONABLE RESULTS, RECOMMEND RECOLLECT TO VERIFY   MCH 30.2 26.0 - 34.0 pg    Comment: QUESTIONABLE RESULTS, RECOMMEND RECOLLECT TO VERIFY   MCHC 29.0 (L) 30.0 - 36.0 g/dL    Comment: QUESTIONABLE RESULTS, RECOMMEND RECOLLECT TO VERIFY   RDW 15.9 (H) 11.5 - 15.5 %    Comment: QUESTIONABLE RESULTS, RECOMMEND RECOLLECT TO VERIFY   Platelets 86 (L) 150 - 400 K/uL    Comment: REPEATED TO VERIFY PLATELET COUNT CONFIRMED BY SMEAR SPECIMEN CHECKED FOR CLOTS Immature Platelet Fraction may be clinically indicated, consider ordering this additional test [id number] QUESTIONABLE RESULTS, RECOMMEND RECOLLECT TO VERIFY    nRBC 0.0 0.0 - 0.2 %    Comment: QUESTIONABLE RESULTS, RECOMMEND RECOLLECT TO VERIFY   Neutrophils Relative % 88 %    Comment: QUESTIONABLE RESULTS, RECOMMEND RECOLLECT TO VERIFY   Neutro Abs 4.3 1.7 - 7.7 K/uL    Comment: QUESTIONABLE RESULTS, RECOMMEND RECOLLECT TO VERIFY   Lymphocytes Relative 4 %    Comment: QUESTIONABLE RESULTS, RECOMMEND RECOLLECT TO  VERIFY   Lymphs Abs 0.2 (L) 0.7 - 4.0 K/uL    Comment: QUESTIONABLE RESULTS, RECOMMEND RECOLLECT TO VERIFY   Monocytes Relative 5 %    Comment: QUESTIONABLE RESULTS, RECOMMEND RECOLLECT TO VERIFY   Monocytes Absolute 0.3 0.1 - 1.0 K/uL    Comment: QUESTIONABLE RESULTS, RECOMMEND RECOLLECT TO VERIFY   Eosinophils Relative 1 %    Comment: QUESTIONABLE RESULTS, RECOMMEND RECOLLECT TO VERIFY   Eosinophils Absolute 0.0 0.0 - 0.5 K/uL    Comment: QUESTIONABLE RESULTS, RECOMMEND RECOLLECT TO VERIFY   Basophils Relative 0 %    Comment: QUESTIONABLE RESULTS, RECOMMEND RECOLLECT TO VERIFY   Basophils Absolute 0.0 0.0 - 0.1 K/uL    Comment: QUESTIONABLE RESULTS, RECOMMEND RECOLLECT TO VERIFY   Immature Granulocytes 2 %    Comment: QUESTIONABLE RESULTS, RECOMMEND RECOLLECT TO VERIFY   Abs Immature Granulocytes 0.10 (H) 0.00 - 0.07 K/uL    Comment: QUESTIONABLE RESULTS, RECOMMEND RECOLLECT TO VERIFY Performed at [facility], [address] [place], [place] [address]   I-stat troponin, ED     Status: Abnormal   Collection Time: 10/14/18  5:58 PM  Result Value Ref Range   Troponin i, poc 0.10 (HH) 0.00 - 0.08 ng/mL   Comment NOTIFIED PHYSICIAN    Comment 3            Comment: Due to the release kinetics of cTnI, a negative result within the first hours of the onset of symptoms does not rule out myocardial infarction with certainty. If myocardial infarction is still suspected, repeat the test at appropriate intervals.   I-stat Chem 8, ED     Status: Abnormal   Collection Time: 10/14/18  5:58 PM  Result Value Ref Range   Sodium 135 135 - 145 mmol/L   Potassium 4.5 3.5 - 5.1 mmol/L   Chloride 98 98 - 111 mmol/L   BUN 24 (H) 6 - 20 mg/dL   Creatinine, Ser 5.80 (H) 0.61 - 1.24 mg/dL   Glucose, Bld 203 (H) 70 - 99 mg/dL   Calcium, Ion 0.95 (L) 1.15 - 1.40 mmol/L   TCO2 28 22 - 32 mmol/L   Hemoglobin 16.0 13.0 - 17.0 g/dL   HCT 47.0 39.0 - 52.0 %  CBC with Differential/Platelet      Status: Abnormal   Collection Time: 10/14/18  7:20 PM  Result Value Ref Range   WBC 15.5 (H) 4.0 - 10.5 K/uL   RBC 4.26 4.22 - 5.81 MIL/uL   Hemoglobin 12.7 (L) 13.0 - 17.0 g/dL    Comment: REPEATED TO VERIFY RESULTS VERIFIED VIA RECOLLECT    HCT 40.2 39.0 - 52.0 %   MCV 94.4 80.0 - 100.0 fL    Comment: REPEATED TO VERIFY RESULTS VERIFIED VIA RECOLLECT    MCH 29.8 26.0 - 34.0 pg   MCHC 31.6 30.0 - 36.0 g/dL   RDW 16.0 (H) 11.5 - 15.5 %   Platelets 290 150 - 400 K/uL    Comment: REPEATED TO VERIFY RESULTS VERIFIED VIA RECOLLECT    nRBC 0.0 0.0 - 0.2 %   Neutrophils Relative % 88 %   Neutro Abs 13.7 (H) 1.7 - 7.7 K/uL   Lymphocytes Relative 5 %   Lymphs Abs 0.7 0.7 - 4.0 K/uL   Monocytes Relative 5 %   Monocytes Absolute 0.7 0.1 - 1.0 K/uL   Eosinophils Relative 0 %   Eosinophils Absolute 0.1 0.0 - 0.5 K/uL   Basophils Relative 0 %   Basophils Absolute 0.1 0.0 - 0.1 K/uL   Immature Granulocytes 2 %   Abs Immature Granulocytes 0.25 (H) 0.00 - 0.07 K/uL    Comment: Performed at [facility], [address], [place], [place] [address]  Basic metabolic panel     Status: Abnormal   Collection Time: 10/14/18  7:20 PM  Result Value Ref Range   Sodium 136 135 - 145 mmol/L   Potassium 3.7 3.5 - 5.1 mmol/L    Comment: DELTA CHECK NOTED   Chloride 94 (L) 98 - 111 mmol/L   CO2 28 22 - 32 mmol/L   Glucose, Bld 138 (H) 70 - 99 mg/dL   BUN 25 (H) 6 - 20 mg/dL   Creatinine, Ser 6.13 (H) 0.61 - 1.24 mg/dL   Calcium 9.2 8.9 - 10.3 mg/dL   GFR calc non Af Amer 9 (L) >60 mL/min   GFR calc Af Amer 10 (L) >60 mL/min    Comment: (NOTE) The eGFR has been calculated using the CKD EPI equation. This calculation has not been validated in all clinical situations. eGFR's persistently <60 mL/min signify possible Chronic Kidney Disease.    Anion gap 14 5 - 15    Comment: Performed at [facility] [address] [place], [place] [address]  I-stat troponin, ED     Status: Abnormal    Collection Time: 10/14/18  7:27 PM  Result Value Ref Range   Troponin i, poc 0.10 (HH) 0.00 - 0.08 ng/mL   Comment NOTIFIED PHYSICIAN    Comment 3            Comment: Due to the release kinetics of cTnI, a negative result within the first hours of the onset of symptoms does not rule out myocardial infarction with certainty. If myocardial infarction is still suspected, repeat the test at appropriate intervals.   I-stat chem 8, ed     Status: Abnormal   Collection Time: 10/14/18  7:46 PM  Result Value Ref Range   Sodium 137 135 - 145 mmol/L   Potassium 3.8 3.5 - 5.1 mmol/L   Chloride 95 (L) 98 - 111 mmol/L   BUN 27 (H) 6 - 20 mg/dL   Creatinine, Ser 6.20 (H) 0.61 - 1.24 mg/dL   Glucose, Bld 133 (H) 70 - 99 mg/dL   Calcium, Ion 1.02 (L) 1.15 - 1.40 mmol/L  TCO2 33 (H) 22 - 32 mmol/L   Hemoglobin 14.6 13.0 - 17.0 g/dL   HCT 43.0 39.0 - 52.0 %  Comprehensive metabolic panel     Status: Abnormal   Collection Time: 10/14/18  7:52 PM  Result Value Ref Range   Sodium 138 135 - 145 mmol/L   Potassium 4.0 3.5 - 5.1 mmol/L   Chloride 94 (L) 98 - 111 mmol/L   CO2 30 22 - 32 mmol/L   Glucose, Bld 120 (H) 70 - 99 mg/dL   BUN 25 (H) 6 - 20 mg/dL   Creatinine, Ser 6.20 (H) 0.61 - 1.24 mg/dL   Calcium 9.4 8.9 - 10.3 mg/dL   Total Protein 7.8 6.5 - 8.1 g/dL   Albumin 3.7 3.5 - 5.0 g/dL   AST 20 15 - 41 U/L   ALT 27 0 - 44 U/L   Alkaline Phosphatase 57 38 - 126 U/L   Total Bilirubin 0.8 0.3 - 1.2 mg/dL   GFR calc non Af Amer 9 (L) >60 mL/min   GFR calc Af Amer 10 (L) >60 mL/min    Comment: (NOTE) The eGFR has been calculated using the CKD EPI equation. This calculation has not been validated in all clinical situations. eGFR's persistently <60 mL/min signify possible Chronic Kidney Disease.    Anion gap 14 5 - 15    Comment: Performed at [facility] [address], [place], [place] [address]  Lipase, blood     Status: None   Collection Time: 10/14/18  7:52 PM  Result  Value Ref Range   Lipase 29 11 - 51 U/L    Comment: Performed at [facility] [address], [place], [place] [address]  Troponin I Once     Status: None   Collection Time: 10/14/18  7:52 PM  Result Value Ref Range   Troponin I <0.03 <0.03 ng/mL    Comment: Performed at [facility] [address], [place], [place] [address]  Troponin I Now Then Q6H     Status: Abnormal   Collection Time: 10/14/18 10:29 PM  Result Value Ref Range   Troponin I 0.15 (HH) <0.03 ng/mL    Comment: CRITICAL RESULT CALLED TO, READ BACK BY AND VERIFIED WITH: GLOSTER J,RN 10/14/18 2320 WAYK Performed at [facility] [address] [place], [place] [address]   CBG monitoring, ED     Status: None   Collection Time: 10/14/18 10:57 PM  Result Value Ref Range   Glucose-Capillary 84 70 - 99 mg/dL  Hemoglobin A1c     Status: Abnormal   Collection Time: 10/15/18 12:46 AM  Result Value Ref Range   Hgb A1c MFr Bld 7.0 (H) 4.8 - 5.6 %    Comment: (NOTE) Pre diabetes:          5.7%-6.4% Diabetes:              >6.4% Glycemic control for   <7.0% adults with diabetes    Mean Plasma Glucose 154.2 mg/dL    Comment: Performed at [facility] [address] [place], [place] [address]  Magnesium     Status: None   Collection Time: 10/15/18 12:46 AM  Result Value Ref Range   Magnesium 2.2 1.7 - 2.4 mg/dL    Comment: Performed at [facility] [address] [place], [place] [address]  Phosphorus     Status: None   Collection Time: 10/15/18 12:46 AM  Result Value  Ref Range   Phosphorus 4.6 2.5 - 4.6 mg/dL    Comment: Performed at [facility] [address] [place], [place] [address]  MRSA PCR Screening     Status: Abnormal   Collection Time: 10/15/18 12:54 AM  Result Value Ref Range   MRSA by PCR POSITIVE (A) NEGATIVE    Comment: RESULT CALLED TO, READ BACK BY AND VERIFIED WITH: RN TIM Sabino Snipes [id number] @0520  THANEY        The GeneXpert MRSA Assay (FDA approved for NASAL  specimens only), is one component of a comprehensive MRSA colonization surveillance program. It is not intended to diagnose MRSA infection nor to guide or monitor treatment for MRSA infections. Performed at [facility], [place] [address] [place], [place] [address]   Glucose, capillary     Status: None   Collection Time: 10/15/18  4:03 AM  Result Value Ref Range   Glucose-Capillary 94 70 - 99 mg/dL  Troponin I Now Then Q6H     Status: Abnormal   Collection Time: 10/15/18  4:31 AM  Result Value Ref Range   Troponin I 0.26 (HH) <0.03 ng/mL    Comment: CRITICAL VALUE NOTED.  VALUE IS CONSISTENT WITH PREVIOUSLY REPORTED AND CALLED VALUE. Performed at [facility], [place] [address] [place], [place] [address]   Troponin I Now Then Q6H     Status: Abnormal   Collection Time: 10/15/18  9:17 AM  Result Value Ref Range   Troponin I 0.20 (HH) <0.03 ng/mL    Comment: CRITICAL VALUE NOTED.  VALUE IS CONSISTENT WITH PREVIOUSLY REPORTED AND CALLED VALUE. Performed at [facility], [place] [address] [place], [place] [address]   Glucose, capillary     Status: Abnormal   Collection Time: 10/15/18 11:31 AM  Result Value Ref Range   Glucose-Capillary 277 (H) 70 - 99 mg/dL  Glucose, capillary     Status: None   Collection Time: 10/15/18  4:36 PM  Result Value Ref Range   Glucose-Capillary 82 70 - 99 mg/dL  Glucose, capillary     Status: Abnormal   Collection Time: 10/15/18  7:51 PM  Result Value Ref Range   Glucose-Capillary 154 (H) 70 - 99 mg/dL  CBC     Status: Abnormal   Collection Time: 10/16/18  3:08 AM  Result Value Ref Range   WBC 13.8 (H) 4.0 - 10.5 K/uL   RBC 3.76 (L) 4.22 - 5.81 MIL/uL   Hemoglobin 11.0 (L) 13.0 - 17.0 g/dL    Comment: DELTA CHECK NOTED REPEATED TO VERIFY    HCT 35.5 (L) 39.0 - 52.0 %   MCV 94.4 80.0 - 100.0 fL   MCH 29.3 26.0 - 34.0 pg   MCHC 31.0 30.0 - 36.0 g/dL   RDW 15.9 (H) 11.5 - 15.5 %   Platelets 311 150 - 400 K/uL   nRBC 0.0 0.0 -  0.2 %    Comment: Performed at [facility], [place] [address], [place], [place] [address]  Comprehensive metabolic panel     Status: Abnormal   Collection Time: 10/16/18  3:08 AM  Result Value Ref Range   Sodium 133 (L) 135 - 145 mmol/L   Potassium 5.2 (H) 3.5 - 5.1 mmol/L   Chloride 93 (L) 98 - 111 mmol/L   CO2 25 22 - 32 mmol/L   Glucose, Bld 114 (H) 70 - 99 mg/dL   BUN 55 (H) 6 - 20 mg/dL   Creatinine, Ser  9.95 (H) 0.61 - 1.24 mg/dL    Comment: DELTA CHECK NOTED   Calcium 9.0 8.9 - 10.3 mg/dL   Total Protein 6.5 6.5 - 8.1 g/dL   Albumin 3.0 (L) 3.5 - 5.0 g/dL   AST 14 (L) 15 - 41 U/L   ALT 19 0 - 44 U/L   Alkaline Phosphatase 60 38 - 126 U/L   Total Bilirubin 0.6 0.3 - 1.2 mg/dL   GFR calc non Af Amer 5 (L) >60 mL/min   GFR calc Af Amer 6 (L) >60 mL/min    Comment: (NOTE) The eGFR has been calculated using the CKD EPI equation. This calculation has not been validated in all clinical situations. eGFR's persistently <60 mL/min signify possible Chronic Kidney Disease.    Anion gap 15 5 - 15    Comment: Performed at [facility] [address], [place], [place] [address]  Glucose, capillary     Status: Abnormal   Collection Time: 10/16/18  6:46 AM  Result Value Ref Range   Glucose-Capillary 109 (H) 70 - 99 mg/dL    ROS: as in HPI  Physical Exam: Vitals:   10/16/18 0410 10/16/18 0835  BP: (!) 110/53 138/62  Pulse: 83   Resp: 13   Temp: 98.5 F (36.9 C) 98.5 F (36.9 C)  SpO2: 96%      General: alert  Hispanic Male NAD, HEENT: [place] , EOMI, Not icteric, MMM Neck: no jvd  Heart: RRR.  No m,r,g Lungs: CTA , no r,c,w Abdomen: BS pos.soft , tender Left quad More than Right , no rebound Extremities: No pedal edema  Skin: no overt rash,  Neuro: OX3, moves all extrem  Dialysis Access: Pos . Bruit RFA AVF  Dialysis Orders: Center:  [facility]  on TTS . EDW 74kg HD Bath 2k, 2.25Ca  Time 4hr  uF profile 2  Heparin 2000 . Access RFA AVF      Hectorol 3 mcg IV/HD   Last Mircera  75 mcg 10/12/18 with last hgb11.6  10/12/18 and no ESA since 09/07/18      Assessment/Plan 1. ESRD -  HD on TTS , Labs and vol ok today, next HD tomorrow on Schedule  2. Chest pain = wu per admit / card / Noted card plans Card Cath to eval  tomor .  3. Hypertension/volume  - no excess vol by exam or cxr / Hypotension post op hd noted 11/09  And is on Midodrine  10 mg Pre hd , Hospital admit list  Has still on Coreg, Losartan and hydralazine  All stopped at OP HD 2/2 Low bp  , 4. Anemia  - hgb 11.0  No esa as op  5. Metabolic bone disease -  Auryxia as  Phos bind and on hectorol  6. DM type 2 per admit  7. Nutrition - Renal carb mod , rena vit  8. Tobacco abuse - per admit counsel on need to stop   Ernest Haber, PA-C [facility] [phone] 10/16/2018, 11:08 AM   Pt seen, examined and agree w A/P as above.  [name] MD [employer] pager [phone]   10/16/2018, 12:09 PM

## 2018-10-16 NOTE — Progress Notes (Signed)
PROGRESS NOTE    Fernando Jones  ZOX:[id number] DOB: 1961/04/11 DOA: 10/14/2018 PCP: Patient, No Pcp Per    Brief Narrative:57 y.o.malewith a hx of ESRD on HD (T/Th/Sat), DM, HTN, GERD, tobacco abuse, and HLD who presented w/ L chest pain and body aches.  Assessment & Plan:   Active Problems:   DM (diabetes mellitus), type 2 with renal complications (HCC)   TOBACCO ABUSE   Essential hypertension   ESRD (end stage renal disease) (HCC)   Chest pain   Atypical chest pain: Possibly unstable angina.  Cardiology consulted and plan for cardiac cath in am.  CT angio of the chest, abdomen and pelvis shows atherosclerosis of the thoracic and abdominal aorta without aneurysm and dissection.  Resume aspirin and metoprolol.   ESRD on HD TTS: Nephrology consulted and recommendations given.    Hypertension:  Well controlled.    Diabetes Mellitus: CBG (last 3)  Recent Labs    10/16/18 0646 10/16/18 1109 10/16/18 1633  GLUCAP 109* 160* 89   Resume SSi.    Tobacco abuse:  Counseled.     DVT prophylaxis: sq heparin.  Code Status:  Full code.  Family Communication: none at bedside.  Disposition Plan: pending clinical improvement.    Consultants:   Cardiology.    Procedures: none.    Antimicrobials: none.    Subjective:  reports occasional chest discomfort.   Objective: Vitals:   10/15/18 2034 10/16/18 0410 10/16/18 0835 10/16/18 1419  BP: 116/60 (!) 110/53 138/62   Pulse: 88 83    Resp: 16 13  18   Temp: 99.2 F (37.3 C) 98.5 F (36.9 C) 98.5 F (36.9 C) 97.7 F (36.5 C)  TempSrc: Oral Oral Oral Oral  SpO2: 99% 96%    Weight:      Height:        Intake/Output Summary (Last 24 hours) at 10/16/2018 1625 Last data filed at 10/16/2018 0854 Gross per 24 hour  Intake 160 ml  Output 1 ml  Net 159 ml   Filed Weights   10/14/18 1738 10/14/18 2356  Weight: 70 kg 73.9 kg    Examination:  General exam: Appears calm and comfortable    Respiratory system: Clear to auscultation. Respiratory effort normal. Cardiovascular system: S1 & S2 heard, RRR. No JVD, murmurs, . No pedal edema. Gastrointestinal system: Abdomen is nondistended, soft and nontender. No organomegaly or masses felt. Normal bowel sounds heard. Central nervous system: Alert and oriented. No focal neurological deficits. Extremities: Symmetric 5 x 5 power. Skin: No rashes, lesions or ulcers Psychiatry: ANXIOUS    Data Reviewed: I have personally reviewed following labs and imaging studies  CBC: Recent Labs  Lab 10/14/18 1746 10/14/18 1758 10/14/18 1920 10/14/18 1946 10/16/18 0308  WBC 4.9  --  15.5*  --  13.8*  NEUTROABS 4.3  --  13.7*  --   --   HGB 3.8* 16.0 12.7* 14.6 11.0*  HCT 13.1* 47.0 40.2 43.0 35.5*  MCV 104.0*  --  94.4  --  94.4  PLT 86*  --  290  --  811   Basic Metabolic Panel: Recent Labs  Lab 10/14/18 1758 10/14/18 1920 10/14/18 1946 10/14/18 1952 10/15/18 0046 10/16/18 0308  NA 135 136 137 138  --  133*  K 4.5 3.7 3.8 4.0  --  5.2*  CL 98 94* 95* 94*  --  93*  CO2  --  28  --  30  --  25  GLUCOSE 203* 138* 133* 120*  --  114*  BUN 24* 25* 27* 25*  --  55*  CREATININE 5.80* 6.13* 6.20* 6.20*  --  9.95*  CALCIUM  --  9.2  --  9.4  --  9.0  MG  --   --   --   --  2.2  --   PHOS  --   --   --   --  4.6  --    GFR: Estimated Creatinine Clearance: 7.3 mL/min (A) (by C-G formula based on SCr of 9.95 mg/dL (H)). Liver Function Tests: Recent Labs  Lab 10/14/18 1952 10/16/18 0308  AST 20 14*  ALT 27 19  ALKPHOS 57 60  BILITOT 0.8 0.6  PROT 7.8 6.5  ALBUMIN 3.7 3.0*   Recent Labs  Lab 10/14/18 1952  LIPASE 29   No results for input(s): AMMONIA in the last 168 hours. Coagulation Profile: No results for input(s): INR, PROTIME in the last 168 hours. Cardiac Enzymes: Recent Labs  Lab 10/14/18 1952 10/14/18 2229 10/15/18 0431 10/15/18 0917  TROPONINI <0.03 0.15* 0.26* 0.20*   BNP (last 3 results) No results  for input(s): PROBNP in the last 8760 hours. HbA1C: Recent Labs    10/15/18 0046  HGBA1C 7.0*   CBG: Recent Labs  Lab 10/15/18 1131 10/15/18 1636 10/15/18 1951 10/16/18 0646 10/16/18 1109  GLUCAP 277* 82 154* 109* 160*   Lipid Profile: No results for input(s): CHOL, HDL, LDLCALC, TRIG, CHOLHDL, LDLDIRECT in the last 72 hours. Thyroid Function Tests: No results for input(s): TSH, T4TOTAL, FREET4, T3FREE, THYROIDAB in the last 72 hours. Anemia Panel: No results for input(s): VITAMINB12, FOLATE, FERRITIN, TIBC, IRON, RETICCTPCT in the last 72 hours. Sepsis Labs: No results for input(s): PROCALCITON, LATICACIDVEN in the last 168 hours.  Recent Results (from the past 240 hour(s))  MRSA PCR Screening     Status: Abnormal   Collection Time: 10/15/18 12:54 AM  Result Value Ref Range Status   MRSA by PCR POSITIVE (A) NEGATIVE Final    Comment: RESULT CALLED TO, READ BACK BY AND VERIFIED WITH: RN TIM IRBY [id number] @[id number]  THANEY        The GeneXpert MRSA Assay (FDA approved for NASAL specimens only), is one component of a comprehensive MRSA colonization surveillance program. It is not intended to diagnose MRSA infection nor to guide or monitor treatment for MRSA infections. Performed at [facility], [place] [address], [place], [place] [address]          Radiology Studies: Dg Chest 2 View  Result Date: 10/14/2018 CLINICAL DATA:  Sudden onset chest pain and shortness of breath, pain radiating to neck. EXAM: CHEST - 2 VIEW COMPARISON:  Chest x-ray dated 06/30/2018. FINDINGS: Heart size and mediastinal contours are within normal limits. Atherosclerotic changes noted at the aortic arch. Lungs are clear. No pleural effusion or pneumothorax seen. Osseous structures about the chest are unremarkable. IMPRESSION: No active cardiopulmonary disease. No evidence of pneumonia or pulmonary edema. Electronically Signed   By: Franki Cabot M.D.   On: 10/14/2018 19:06   Ct Angio Chest  Aorta W/cm &/or Wo/cm  Result Date: 10/15/2018 CLINICAL DATA:  Chest and abdominal pain. EXAM: CT ANGIOGRAPHY CHEST, ABDOMEN AND PELVIS TECHNIQUE: Multidetector CT imaging through the chest, abdomen and pelvis was performed using the standard protocol during bolus administration of intravenous contrast. Multiplanar reconstructed images and MIPs were obtained and reviewed to evaluate the vascular anatomy. CONTRAST:  133mL ISOVUE-370 IOPAMIDOL (ISOVUE-370) INJECTION 76% COMPARISON:  CT scan of December 06, 2017. FINDINGS:  CTA CHEST FINDINGS Cardiovascular: Atherosclerosis of thoracic aorta is noted without aneurysm or dissection. Normal cardiac size. No pericardial effusion. Coronary artery calcifications are noted. Mediastinum/Nodes: No enlarged mediastinal, hilar, or axillary lymph nodes. Thyroid gland, trachea, and esophagus demonstrate no significant findings. Lungs/Pleura: No pneumothorax or pleural effusion is noted. Left lung is clear. 7 mm nodule is noted in right lung base best seen on image number 116 of series 6. Musculoskeletal: No chest wall abnormality. No acute or significant osseous findings. Review of the MIP images confirms the above findings. CTA ABDOMEN AND PELVIS FINDINGS VASCULAR Aorta: Atherosclerosis of abdominal aorta is noted without aneurysm or dissection. Celiac: Patent without evidence of aneurysm, dissection, vasculitis or significant stenosis. SMA: Patent without evidence of aneurysm, dissection, vasculitis or significant stenosis. Renals: Moderate to severe atheromatous disease is seen diffusely involving both renal arteries. IMA: Patent without evidence of aneurysm, dissection, vasculitis or significant stenosis. Inflow: Moderate eccentric stenosis is seen involving the proximal left common iliac artery. Multifocal moderate stenoses are noted throughout both external iliac arteries. Veins: No obvious venous abnormality within the limitations of this arterial phase study. Review of the  MIP images confirms the above findings. NON-VASCULAR Hepatobiliary: No focal liver abnormality is seen. No gallstones, gallbladder wall thickening, or biliary dilatation. Pancreas: Unremarkable. No pancreatic ductal dilatation or surrounding inflammatory changes. Spleen: Normal in size without focal abnormality. Adrenals/Urinary Tract: Adrenal glands appear normal. Bilateral renal atrophy is noted consistent with history of end-stage renal disease. No hydronephrosis or renal obstruction is noted. Urinary bladder is decompressed. Stomach/Bowel: The stomach appears normal. There is no evidence of bowel obstruction or inflammation. Status post appendectomy. Stool is noted throughout the colon. Lymphatic: No significant adenopathy is noted. Reproductive: Prostate is unremarkable. Other: No abdominal wall hernia or abnormality. No abdominopelvic ascites. Musculoskeletal: No acute or significant osseous findings. Review of the MIP images confirms the above findings. IMPRESSION: Atherosclerosis of thoracic and abdominal aorta is noted without aneurysm or dissection. Coronary artery calcifications are noted suggesting coronary artery disease. 7 mm nodule is noted in right lung base. Non-contrast chest CT at 6-12 months is recommended. If the nodule is stable at time of repeat CT, then future CT at 18-24 months (from today's scan) is considered optional for low-risk patients, but is recommended for high-risk patients. This recommendation follows the consensus statement: Guidelines for Management of Incidental Pulmonary Nodules Detected on CT Images: From the Fleischner Society 2017; Radiology 2017; 284:228-243. No evidence of significant mesenteric artery stenosis. Moderate diffuse atheromatous disease is seen involving both renal arteries, with bilateral renal atrophy consistent with end-stage renal disease. Moderate eccentric stenosis is seen involving the proximal left common iliac artery. Multifocal moderate stenoses are  noted throughout both external iliac arteries. Electronically Signed   By: Marijo Conception, M.D.   On: 10/15/2018 20:08   Ct Angio Abd/pel W/ And/or W/o  Result Date: 10/15/2018 CLINICAL DATA:  Chest and abdominal pain. EXAM: CT ANGIOGRAPHY CHEST, ABDOMEN AND PELVIS TECHNIQUE: Multidetector CT imaging through the chest, abdomen and pelvis was performed using the standard protocol during bolus administration of intravenous contrast. Multiplanar reconstructed images and MIPs were obtained and reviewed to evaluate the vascular anatomy. CONTRAST:  177mL ISOVUE-370 IOPAMIDOL (ISOVUE-370) INJECTION 76% COMPARISON:  CT scan of December 06, 2017. FINDINGS: CTA CHEST FINDINGS Cardiovascular: Atherosclerosis of thoracic aorta is noted without aneurysm or dissection. Normal cardiac size. No pericardial effusion. Coronary artery calcifications are noted. Mediastinum/Nodes: No enlarged mediastinal, hilar, or axillary lymph nodes. Thyroid gland, trachea, and esophagus  demonstrate no significant findings. Lungs/Pleura: No pneumothorax or pleural effusion is noted. Left lung is clear. 7 mm nodule is noted in right lung base best seen on image number 116 of series 6. Musculoskeletal: No chest wall abnormality. No acute or significant osseous findings. Review of the MIP images confirms the above findings. CTA ABDOMEN AND PELVIS FINDINGS VASCULAR Aorta: Atherosclerosis of abdominal aorta is noted without aneurysm or dissection. Celiac: Patent without evidence of aneurysm, dissection, vasculitis or significant stenosis. SMA: Patent without evidence of aneurysm, dissection, vasculitis or significant stenosis. Renals: Moderate to severe atheromatous disease is seen diffusely involving both renal arteries. IMA: Patent without evidence of aneurysm, dissection, vasculitis or significant stenosis. Inflow: Moderate eccentric stenosis is seen involving the proximal left common iliac artery. Multifocal moderate stenoses are noted throughout  both external iliac arteries. Veins: No obvious venous abnormality within the limitations of this arterial phase study. Review of the MIP images confirms the above findings. NON-VASCULAR Hepatobiliary: No focal liver abnormality is seen. No gallstones, gallbladder wall thickening, or biliary dilatation. Pancreas: Unremarkable. No pancreatic ductal dilatation or surrounding inflammatory changes. Spleen: Normal in size without focal abnormality. Adrenals/Urinary Tract: Adrenal glands appear normal. Bilateral renal atrophy is noted consistent with history of end-stage renal disease. No hydronephrosis or renal obstruction is noted. Urinary bladder is decompressed. Stomach/Bowel: The stomach appears normal. There is no evidence of bowel obstruction or inflammation. Status post appendectomy. Stool is noted throughout the colon. Lymphatic: No significant adenopathy is noted. Reproductive: Prostate is unremarkable. Other: No abdominal wall hernia or abnormality. No abdominopelvic ascites. Musculoskeletal: No acute or significant osseous findings. Review of the MIP images confirms the above findings. IMPRESSION: Atherosclerosis of thoracic and abdominal aorta is noted without aneurysm or dissection. Coronary artery calcifications are noted suggesting coronary artery disease. 7 mm nodule is noted in right lung base. Non-contrast chest CT at 6-12 months is recommended. If the nodule is stable at time of repeat CT, then future CT at 18-24 months (from today's scan) is considered optional for low-risk patients, but is recommended for high-risk patients. This recommendation follows the consensus statement: Guidelines for Management of Incidental Pulmonary Nodules Detected on CT Images: From the Fleischner Society 2017; Radiology 2017; 284:228-243. No evidence of significant mesenteric artery stenosis. Moderate diffuse atheromatous disease is seen involving both renal arteries, with bilateral renal atrophy consistent with  end-stage renal disease. Moderate eccentric stenosis is seen involving the proximal left common iliac artery. Multifocal moderate stenoses are noted throughout both external iliac arteries. Electronically Signed   By: Marijo Conception, M.D.   On: 10/15/2018 20:08        Scheduled Meds: . aspirin EC  81 mg Oral Daily  . Chlorhexidine Gluconate Cloth  6 each Topical Q0600  . [START ON 10/17/2018] doxercalciferol  3 mcg Intravenous Q T,Th,Sa-HD  . heparin injection (subcutaneous)  5,000 Units Subcutaneous Q8H  . insulin aspart  0-9 Units Subcutaneous TID WC  . [START ON 10/17/2018] midodrine  10 mg Oral Q T,Th,Sa-HD  . mupirocin ointment  1 application Nasal BID  . pantoprazole  40 mg Oral BID AC  . rOPINIRole  0.25-0.5 mg Oral QHS   Continuous Infusions:   LOS: 0 days    Time spent: 35 minutes    Hosie Poisson, MD [facility] Pager [phone] If 7PM-7AM, please contact night-coverage [email or web address] Password [facility] 10/16/2018, 4:25 PM

## 2018-10-16 NOTE — Progress Notes (Signed)
Progress Note  Patient Name: Fernando Jones Date of Encounter: 10/16/2018  Primary Cardiologist: No primary care provider on file. new DR. Turner   Subjective   Pain, abd chest and Rt arm and into neck.  HR ST to 110  Inpatient Medications    Scheduled Meds: . aspirin EC  81 mg Oral Daily  . Chlorhexidine Gluconate Cloth  6 each Topical Q0600  . heparin injection (subcutaneous)  5,000 Units Subcutaneous Q8H  . insulin aspart  0-9 Units Subcutaneous TID WC  . [START ON 10/17/2018] midodrine  10 mg Oral Q T,Th,Sa-HD  . mupirocin ointment  1 application Nasal BID  . pantoprazole  40 mg Oral BID AC  . rOPINIRole  0.25-0.5 mg Oral QHS   Continuous Infusions:  PRN Meds: acetaminophen, alum & mag hydroxide-simeth, ondansetron (ZOFRAN) IV, oxyCODONE   Vital Signs    Vitals:   10/15/18 1135 10/15/18 2034 10/16/18 0410 10/16/18 0835  BP: (!) 86/60 116/60 (!) 110/53 138/62  Pulse: 88 88 83   Resp: (!) 28 16 13    Temp:  99.2 F (37.3 C) 98.5 F (36.9 C) 98.5 F (36.9 C)  TempSrc:  Oral Oral Oral  SpO2: 100% 99% 96%   Weight:      Height:        Intake/Output Summary (Last 24 hours) at 10/16/2018 1003 Last data filed at 10/15/2018 2000 Gross per 24 hour  Intake 240 ml  Output 1 ml  Net 239 ml   Filed Weights   10/14/18 1738 10/14/18 2356  Weight: 70 kg 73.9 kg    Telemetry    SR to ST - Personally Reviewed  ECG    No new - Personally Reviewed  Physical Exam   GEN: was sleeping then in pain abd and chest..   Neck: No JVD Cardiac: RRR, no murmurs, rubs, or gallops. Chest wall tender to touch and abd. Respiratory: Clear to auscultation bilaterally. GI: Soft, nontender, non-distended  MS: No edema; No deformity. Neuro:  Nonfocal  Psych: Normal affect   Labs    Chemistry Recent Labs  Lab 10/14/18 1920 10/14/18 1946 10/14/18 1952 10/16/18 0308  NA 136 137 138 133*  K 3.7 3.8 4.0 5.2*  CL 94* 95* 94* 93*  CO2 28  --  30 25  GLUCOSE 138*  133* 120* 114*  BUN 25* 27* 25* 55*  CREATININE 6.13* 6.20* 6.20* 9.95*  CALCIUM 9.2  --  9.4 9.0  PROT  --   --  7.8 6.5  ALBUMIN  --   --  3.7 3.0*  AST  --   --  20 14*  ALT  --   --  27 19  ALKPHOS  --   --  57 60  BILITOT  --   --  0.8 0.6  GFRNONAA 9*  --  9* 5*  GFRAA 10*  --  10* 6*  ANIONGAP 14  --  14 15     Hematology Recent Labs  Lab 10/14/18 1746  10/14/18 1920 10/14/18 1946 10/16/18 0308  WBC 4.9  --  15.5*  --  13.8*  RBC 1.26*  --  4.26  --  3.76*  HGB 3.8*   < > 12.7* 14.6 11.0*  HCT 13.1*   < > 40.2 43.0 35.5*  MCV 104.0*  --  94.4  --  94.4  MCH 30.2  --  29.8  --  29.3  MCHC 29.0*  --  31.6  --  31.0  RDW  15.9*  --  16.0*  --  15.9*  PLT 86*  --  290  --  311   < > = values in this interval not displayed.    Cardiac Enzymes Recent Labs  Lab 10/14/18 1952 10/14/18 2229 10/15/18 0431 10/15/18 0917  TROPONINI <0.03 0.15* 0.26* 0.20*    Recent Labs  Lab 10/14/18 1758 10/14/18 1927  TROPIPOC 0.10* 0.10*     BNPNo results for input(s): BNP, PROBNP in the last 168 hours.   DDimer No results for input(s): DDIMER in the last 168 hours.   Radiology    Dg Chest 2 View  Result Date: 10/14/2018 CLINICAL DATA:  Sudden onset chest pain and shortness of breath, pain radiating to neck. EXAM: CHEST - 2 VIEW COMPARISON:  Chest x-ray dated 06/30/2018. FINDINGS: Heart size and mediastinal contours are within normal limits. Atherosclerotic changes noted at the aortic arch. Lungs are clear. No pleural effusion or pneumothorax seen. Osseous structures about the chest are unremarkable. IMPRESSION: No active cardiopulmonary disease. No evidence of pneumonia or pulmonary edema. Electronically Signed   By: Franki Cabot M.D.   On: 10/14/2018 19:06   Ct Angio Chest Aorta W/cm &/or Wo/cm  Result Date: 10/15/2018 CLINICAL DATA:  Chest and abdominal pain. EXAM: CT ANGIOGRAPHY CHEST, ABDOMEN AND PELVIS TECHNIQUE: Multidetector CT imaging through the chest, abdomen  and pelvis was performed using the standard protocol during bolus administration of intravenous contrast. Multiplanar reconstructed images and MIPs were obtained and reviewed to evaluate the vascular anatomy. CONTRAST:  166mL ISOVUE-370 IOPAMIDOL (ISOVUE-370) INJECTION 76% COMPARISON:  CT scan of December 06, 2017. FINDINGS: CTA CHEST FINDINGS Cardiovascular: Atherosclerosis of thoracic aorta is noted without aneurysm or dissection. Normal cardiac size. No pericardial effusion. Coronary artery calcifications are noted. Mediastinum/Nodes: No enlarged mediastinal, hilar, or axillary lymph nodes. Thyroid gland, trachea, and esophagus demonstrate no significant findings. Lungs/Pleura: No pneumothorax or pleural effusion is noted. Left lung is clear. 7 mm nodule is noted in right lung base best seen on image number 116 of series 6. Musculoskeletal: No chest wall abnormality. No acute or significant osseous findings. Review of the MIP images confirms the above findings. CTA ABDOMEN AND PELVIS FINDINGS VASCULAR Aorta: Atherosclerosis of abdominal aorta is noted without aneurysm or dissection. Celiac: Patent without evidence of aneurysm, dissection, vasculitis or significant stenosis. SMA: Patent without evidence of aneurysm, dissection, vasculitis or significant stenosis. Renals: Moderate to severe atheromatous disease is seen diffusely involving both renal arteries. IMA: Patent without evidence of aneurysm, dissection, vasculitis or significant stenosis. Inflow: Moderate eccentric stenosis is seen involving the proximal left common iliac artery. Multifocal moderate stenoses are noted throughout both external iliac arteries. Veins: No obvious venous abnormality within the limitations of this arterial phase study. Review of the MIP images confirms the above findings. NON-VASCULAR Hepatobiliary: No focal liver abnormality is seen. No gallstones, gallbladder wall thickening, or biliary dilatation. Pancreas: Unremarkable. No  pancreatic ductal dilatation or surrounding inflammatory changes. Spleen: Normal in size without focal abnormality. Adrenals/Urinary Tract: Adrenal glands appear normal. Bilateral renal atrophy is noted consistent with history of end-stage renal disease. No hydronephrosis or renal obstruction is noted. Urinary bladder is decompressed. Stomach/Bowel: The stomach appears normal. There is no evidence of bowel obstruction or inflammation. Status post appendectomy. Stool is noted throughout the colon. Lymphatic: No significant adenopathy is noted. Reproductive: Prostate is unremarkable. Other: No abdominal wall hernia or abnormality. No abdominopelvic ascites. Musculoskeletal: No acute or significant osseous findings. Review of the MIP images confirms the above findings.  IMPRESSION: Atherosclerosis of thoracic and abdominal aorta is noted without aneurysm or dissection. Coronary artery calcifications are noted suggesting coronary artery disease. 7 mm nodule is noted in right lung base. Non-contrast chest CT at 6-12 months is recommended. If the nodule is stable at time of repeat CT, then future CT at 18-24 months (from today's scan) is considered optional for low-risk patients, but is recommended for high-risk patients. This recommendation follows the consensus statement: Guidelines for Management of Incidental Pulmonary Nodules Detected on CT Images: From the Fleischner Society 2017; Radiology 2017; 284:228-243. No evidence of significant mesenteric artery stenosis. Moderate diffuse atheromatous disease is seen involving both renal arteries, with bilateral renal atrophy consistent with end-stage renal disease. Moderate eccentric stenosis is seen involving the proximal left common iliac artery. Multifocal moderate stenoses are noted throughout both external iliac arteries. Electronically Signed   By: Marijo Conception, M.D.   On: 10/15/2018 20:08   Ct Angio Abd/pel W/ And/or W/o  Result Date: 10/15/2018 CLINICAL DATA:   Chest and abdominal pain. EXAM: CT ANGIOGRAPHY CHEST, ABDOMEN AND PELVIS TECHNIQUE: Multidetector CT imaging through the chest, abdomen and pelvis was performed using the standard protocol during bolus administration of intravenous contrast. Multiplanar reconstructed images and MIPs were obtained and reviewed to evaluate the vascular anatomy. CONTRAST:  164mL ISOVUE-370 IOPAMIDOL (ISOVUE-370) INJECTION 76% COMPARISON:  CT scan of December 06, 2017. FINDINGS: CTA CHEST FINDINGS Cardiovascular: Atherosclerosis of thoracic aorta is noted without aneurysm or dissection. Normal cardiac size. No pericardial effusion. Coronary artery calcifications are noted. Mediastinum/Nodes: No enlarged mediastinal, hilar, or axillary lymph nodes. Thyroid gland, trachea, and esophagus demonstrate no significant findings. Lungs/Pleura: No pneumothorax or pleural effusion is noted. Left lung is clear. 7 mm nodule is noted in right lung base best seen on image number 116 of series 6. Musculoskeletal: No chest wall abnormality. No acute or significant osseous findings. Review of the MIP images confirms the above findings. CTA ABDOMEN AND PELVIS FINDINGS VASCULAR Aorta: Atherosclerosis of abdominal aorta is noted without aneurysm or dissection. Celiac: Patent without evidence of aneurysm, dissection, vasculitis or significant stenosis. SMA: Patent without evidence of aneurysm, dissection, vasculitis or significant stenosis. Renals: Moderate to severe atheromatous disease is seen diffusely involving both renal arteries. IMA: Patent without evidence of aneurysm, dissection, vasculitis or significant stenosis. Inflow: Moderate eccentric stenosis is seen involving the proximal left common iliac artery. Multifocal moderate stenoses are noted throughout both external iliac arteries. Veins: No obvious venous abnormality within the limitations of this arterial phase study. Review of the MIP images confirms the above findings. NON-VASCULAR  Hepatobiliary: No focal liver abnormality is seen. No gallstones, gallbladder wall thickening, or biliary dilatation. Pancreas: Unremarkable. No pancreatic ductal dilatation or surrounding inflammatory changes. Spleen: Normal in size without focal abnormality. Adrenals/Urinary Tract: Adrenal glands appear normal. Bilateral renal atrophy is noted consistent with history of end-stage renal disease. No hydronephrosis or renal obstruction is noted. Urinary bladder is decompressed. Stomach/Bowel: The stomach appears normal. There is no evidence of bowel obstruction or inflammation. Status post appendectomy. Stool is noted throughout the colon. Lymphatic: No significant adenopathy is noted. Reproductive: Prostate is unremarkable. Other: No abdominal wall hernia or abnormality. No abdominopelvic ascites. Musculoskeletal: No acute or significant osseous findings. Review of the MIP images confirms the above findings. IMPRESSION: Atherosclerosis of thoracic and abdominal aorta is noted without aneurysm or dissection. Coronary artery calcifications are noted suggesting coronary artery disease. 7 mm nodule is noted in right lung base. Non-contrast chest CT at 6-12 months is recommended.  If the nodule is stable at time of repeat CT, then future CT at 18-24 months (from today's scan) is considered optional for low-risk patients, but is recommended for high-risk patients. This recommendation follows the consensus statement: Guidelines for Management of Incidental Pulmonary Nodules Detected on CT Images: From the Fleischner Society 2017; Radiology 2017; 284:228-243. No evidence of significant mesenteric artery stenosis. Moderate diffuse atheromatous disease is seen involving both renal arteries, with bilateral renal atrophy consistent with end-stage renal disease. Moderate eccentric stenosis is seen involving the proximal left common iliac artery. Multifocal moderate stenoses are noted throughout both external iliac arteries.  Electronically Signed   By: Marijo Conception, M.D.   On: 10/15/2018 20:08    Cardiac Studies   Echo 10/15/18 Study Conclusions  - Left ventricle: The cavity size was normal. Wall thickness was   increased in a pattern of mild LVH. Systolic function was normal.   The estimated ejection fraction was in the range of 60% to 65%.   Wall motion was normal; there were no regional wall motion   abnormalities. Doppler parameters are consistent with abnormal   left ventricular relaxation (grade 1 diastolic dysfunction). - Aortic valve: Mildly calcified annulus. Trileaflet; mildly   calcified leaflets. - Mitral valve: Mildly calcified annulus. There was trivial   regurgitation. - Right atrium: Central venous pressure (est): 3 mm Hg. - Tricuspid valve: There was trivial regurgitation. - Pulmonary arteries: Systolic pressure could not be accurately   estimated. - Pericardium, extracardiac: A small pericardial effusion was   identified circumferential to the heart with signs of   organization versus chronicity.   Patient Profile     57 y.o. male with a history of hypertension, hyperlipidemia, type 2 diabetes mellitus, and ESRD on hemodialysis.  He presents with fairly diffuse pain in the abdomen, thorax, neck and shoulders, also arms.  No obvious precipitant.  He reports compliance with hemodialysis and his medications.  Troponin I levels are mildly increased and cardiology is consulted for further evaluation  Assessment & Plan    Abnormal troponin with pk troponin 0.26 -- Echo with normal EF and normal wall motion.   ? nuc  With DM  Though this is more atypical pain.    HTN -  BP now 138/62 and 110/53   Hx of hypotension on HD.  On proAmiatine  ESRD on HD.        For questions or updates, please contact [facility] Please consult [email or web address] for contact info under        Signed, Cecilie Kicks, NP  10/16/2018, 10:03 AM

## 2018-10-16 NOTE — Progress Notes (Signed)
Discussed IV access with Dr. Radford Pax, will plan for cardiac cath in AM at 9 A with Dr. Ellyn Hack.    The patient understands that risks included but are not limited to stroke (1 in 1000), death (1 in 54), kidney failure [usually temporary] (1 in 500), bleeding (1 in 200), allergic reaction [possibly serious] (1 in 200).   Pt is willing to proceed.

## 2018-10-17 ENCOUNTER — Encounter (HOSPITAL_COMMUNITY)
Admission: EM | Disposition: A | Discharge status: Home / Self Care | Payer: Self-pay | Source: Home / Self Care | Attending: Family Medicine

## 2018-10-17 ENCOUNTER — Encounter (HOSPITAL_COMMUNITY): Payer: Self-pay | Admitting: Cardiology

## 2018-10-17 ENCOUNTER — Other Ambulatory Visit: Payer: Self-pay | Admitting: Cardiology

## 2018-10-17 DIAGNOSIS — E782 Mixed hyperlipidemia: Secondary | ICD-10-CM

## 2018-10-17 DIAGNOSIS — I2510 Atherosclerotic heart disease of native coronary artery without angina pectoris: Secondary | ICD-10-CM

## 2018-10-17 HISTORY — PX: LEFT HEART CATH AND CORONARY ANGIOGRAPHY: CATH118249

## 2018-10-17 HISTORY — PX: ULTRASOUND GUIDANCE FOR VASCULAR ACCESS: SHX6516

## 2018-10-17 LAB — CBC
HCT: 39.1 % (ref 39.0–52.0)
Hemoglobin: 12.1 g/dL — ABNORMAL LOW (ref 13.0–17.0)
MCH: 29 pg (ref 26.0–34.0)
MCHC: 30.9 g/dL (ref 30.0–36.0)
MCV: 93.8 fL (ref 80.0–100.0)
PLATELETS: 367 10*3/uL (ref 150–400)
RBC: 4.17 MIL/uL — ABNORMAL LOW (ref 4.22–5.81)
RDW: 15.2 % (ref 11.5–15.5)
WBC: 14 10*3/uL — ABNORMAL HIGH (ref 4.0–10.5)
nRBC: 0 % (ref 0.0–0.2)

## 2018-10-17 LAB — BASIC METABOLIC PANEL
Anion gap: 22 — ABNORMAL HIGH (ref 5–15)
BUN: 74 mg/dL — AB (ref 6–20)
CHLORIDE: 89 mmol/L — AB (ref 98–111)
CO2: 19 mmol/L — ABNORMAL LOW (ref 22–32)
CREATININE: 12.34 mg/dL — AB (ref 0.61–1.24)
Calcium: 9.3 mg/dL (ref 8.9–10.3)
GFR calc Af Amer: 5 mL/min — ABNORMAL LOW (ref >60)
GFR, EST NON AFRICAN AMERICAN: 4 mL/min — AB (ref >60)
Glucose, Bld: 96 mg/dL (ref 70–99)
Potassium: 5.3 mmol/L — ABNORMAL HIGH (ref 3.5–5.1)
SODIUM: 130 mmol/L — AB (ref 135–145)

## 2018-10-17 LAB — GLUCOSE, CAPILLARY
GLUCOSE-CAPILLARY: 149 mg/dL — AB (ref 70–99)
Glucose-Capillary: 102 mg/dL — ABNORMAL HIGH (ref 70–99)
Glucose-Capillary: 85 mg/dL (ref 70–99)
Glucose-Capillary: 111 mg/dL — ABNORMAL HIGH (ref 70–99)

## 2018-10-17 SURGERY — LEFT HEART CATH AND CORONARY ANGIOGRAPHY
Anesthesia: LOCAL

## 2018-10-17 MED ORDER — FENTANYL CITRATE (PF) 100 MCG/2ML IJ SOLN
INTRAMUSCULAR | Status: AC
  Filled 2018-10-17: qty 2

## 2018-10-17 MED ORDER — FENTANYL CITRATE (PF) 100 MCG/2ML IJ SOLN
INTRAMUSCULAR | Status: DC | PRN
  Administered 2018-10-17 (×2): 25 ug via INTRAVENOUS

## 2018-10-17 MED ORDER — LIDOCAINE HCL (PF) 1 % IJ SOLN
INTRAMUSCULAR | Status: AC
  Filled 2018-10-17: qty 30

## 2018-10-17 MED ORDER — HEPARIN (PORCINE) IN NACL 1000-0.9 UT/500ML-% IV SOLN
INTRAVENOUS | Status: AC
  Filled 2018-10-17: qty 1000

## 2018-10-17 MED ORDER — ATORVASTATIN CALCIUM 10 MG PO TABS
10.0000 mg | ORAL_TABLET | Freq: Every day | ORAL | Status: DC
  Administered 2018-10-17 – 2018-10-25 (×8): 10 mg via ORAL
  Filled 2018-10-17 (×8): qty 1

## 2018-10-17 MED ORDER — LOPERAMIDE HCL 2 MG PO CAPS
2.0000 mg | ORAL_CAPSULE | ORAL | Status: DC | PRN
  Administered 2018-10-17 – 2018-10-18 (×2): 2 mg via ORAL
  Filled 2018-10-17 (×3): qty 1

## 2018-10-17 MED ORDER — HEPARIN (PORCINE) IN NACL 1000-0.9 UT/500ML-% IV SOLN
INTRAVENOUS | Status: DC | PRN
  Administered 2018-10-17: 500 mL

## 2018-10-17 MED ORDER — IOHEXOL 350 MG/ML SOLN
INTRAVENOUS | Status: DC | PRN
  Administered 2018-10-17: 80 mL via INTRA_ARTERIAL

## 2018-10-17 MED ORDER — HYDROMORPHONE HCL 1 MG/ML IJ SOLN
INTRAMUSCULAR | Status: AC
  Filled 2018-10-17: qty 0.5

## 2018-10-17 MED ORDER — DOXERCALCIFEROL 4 MCG/2ML IV SOLN
INTRAVENOUS | Status: AC
  Administered 2018-10-17: 3 ug via INTRAVENOUS
  Filled 2018-10-17: qty 2

## 2018-10-17 MED ORDER — SODIUM CHLORIDE 0.9 % IV SOLN: 250.0000 mL | INTRAVENOUS | Status: DC | PRN

## 2018-10-17 MED ORDER — SODIUM CHLORIDE 0.9% FLUSH
3.0000 mL | Freq: Two times a day (BID) | INTRAVENOUS | Status: DC
  Administered 2018-10-17 – 2018-10-25 (×16): 3 mL via INTRAVENOUS

## 2018-10-17 MED ORDER — SODIUM CHLORIDE 0.9% FLUSH: 3.0000 mL | INTRAVENOUS | Status: DC | PRN

## 2018-10-17 MED ORDER — MIDODRINE HCL 5 MG PO TABS
ORAL_TABLET | ORAL | Status: AC
  Filled 2018-10-17: qty 2

## 2018-10-17 MED ORDER — LIDOCAINE HCL (PF) 1 % IJ SOLN
INTRAMUSCULAR | Status: DC | PRN
  Administered 2018-10-17: 10 mL

## 2018-10-17 MED ORDER — HYDROMORPHONE HCL 1 MG/ML IJ SOLN
0.5000 mg | INTRAMUSCULAR | Status: AC | PRN
  Administered 2018-10-17: 0.5 mg via INTRAVENOUS

## 2018-10-17 MED ORDER — MIDAZOLAM HCL 2 MG/2ML IJ SOLN
INTRAMUSCULAR | Status: AC
  Filled 2018-10-17: qty 2

## 2018-10-17 MED ORDER — MIDAZOLAM HCL 2 MG/2ML IJ SOLN
INTRAMUSCULAR | Status: DC | PRN
  Administered 2018-10-17 (×2): 1 mg via INTRAVENOUS

## 2018-10-17 SURGICAL SUPPLY — 9 items
CATH INFINITI MULTIPACK ST 5F (CATHETERS) ×1 IMPLANT
CLOSURE MYNX CONTROL 5F (Vascular Products) ×1 IMPLANT
KIT HEART LEFT (KITS) ×3 IMPLANT
PACK CARDIAC CATHETERIZATION (CUSTOM PROCEDURE TRAY) ×3 IMPLANT
SHEATH PINNACLE 5F 10CM (SHEATH) ×1 IMPLANT
SHEATH PROBE COVER 6X72 (BAG) ×1 IMPLANT
TRANSDUCER W/STOPCOCK (MISCELLANEOUS) ×3 IMPLANT
TUBING CIL FLEX 10 FLL-RA (TUBING) ×3 IMPLANT
WIRE EMERALD 3MM-J .035X150CM (WIRE) ×1 IMPLANT

## 2018-10-17 NOTE — Progress Notes (Unsigned)
Hep[at

## 2018-10-17 NOTE — Progress Notes (Signed)
[facility] Progress Note  Subjective: had LHC which showed mild/mod disease, no severe lesions.  LVEDP was normal to low. Having severe back pain on HD just now.   Vitals:   10/17/18 1009 10/17/18 1014 10/17/18 1019 10/17/18 1056  BP: 135/69 135/81 (!) 143/69 139/70  Pulse: (!) 101 100 100 99  Resp: 11 (!) 8 (!) 7 17  Temp:    98.4 F (36.9 C)  TempSrc:    Oral  SpO2: 99% 98% 98% 97%  Weight:      Height:        Inpatient medications: . aspirin EC  81 mg Oral Daily  . Chlorhexidine Gluconate Cloth  6 each Topical Q0600  . doxercalciferol  3 mcg Intravenous Q T,Th,Sa-HD  . heparin injection (subcutaneous)  5,000 Units Subcutaneous Q8H  . insulin aspart  0-9 Units Subcutaneous TID WC  . midodrine  10 mg Oral Q T,Th,Sa-HD  . mupirocin ointment  1 application Nasal BID  . pantoprazole  40 mg Oral BID AC  . rOPINIRole  0.25-0.5 mg Oral QHS  . sodium chloride flush  3 mL Intravenous Q12H   . sodium chloride     sodium chloride, acetaminophen, ondansetron (ZOFRAN) IV, oxyCODONE, sodium chloride flush  Iron/TIBC/Ferritin/ %Sat    Component Value Date/Time   IRON 39 (L) 12/07/2017 0349   TIBC 178 (L) 12/07/2017 0349   FERRITIN 189 12/07/2017 0349   IRONPCTSAT 22 12/07/2017 0349    Exam: General: alert  Hispanic Male NAD, HEENT: [place] , EOMI, Not icteric, MMM Neck: no jvd  Heart: RRR.  No m,r,g Lungs: CTA , no r,c,w Abdomen: BS pos.soft , tender Left quad More than Right , no rebound Extremities: No pedal edema  Skin: no overt rash,  Neuro: OX3, moves all extrem  Dialysis Access: Pos . Bruit RFA AVF  Dialysis Orders: Center:  [place]  on TTS . EDW 74kg HD Bath 2k, 2.25Ca  Time 4hr  uF profile 2  Heparin 2000 . Access RFA AVF      Hectorol 3 mcg IV/HD  Last Mircera  75 mcg 10/12/18 with last hgb11.6  10/12/18 and no ESA since 09/07/18      Assessment/Plan 1. ESRD -  HD on TTS. HD today.   2. Chest pain - sp LHC w/o sig  disease 3. Hypertension/volume  - no excess vol by exam or cxr / Hypotension post op hd noted 11/09  And is on Midodrine  10 mg Pre hd , Hospital admit list has still on Coreg, Losartan and hydralazine  All stopped at OP HD 2/2 Low bp 4. Anemia  - hgb 11.0  No esa as op  5. Metabolic bone disease -  Auryxia as  Phos bind and on hectorol  6. DM type 2 per admit  7. Nutrition - Renal carb mod , rena vit  8. Tobacco abuse - per admit counsel on need to stop    [name] MD [place] pager [phone]   10/17/2018, 4:28 PM     Recent Labs  Lab 10/14/18 1952 10/15/18 0046 10/16/18 0308 10/17/18 0311  NA 138  --  133* 130*  K 4.0  --  5.2* 5.3*  CL 94*  --  93* 89*  CO2 30  --  25 19*  GLUCOSE 120*  --  114* 96  BUN 25*  --  55* 74*  CREATININE 6.20*  --  9.95* 12.34*  CALCIUM 9.4  --  9.0 9.3  PHOS  --  4.6  --   --   ALBUMIN 3.7  --  3.0*  --    Recent Labs  Lab 10/14/18 1952 10/16/18 0308  AST 20 14*  ALT 27 19  ALKPHOS 57 60  BILITOT 0.8 0.6  PROT 7.8 6.5   Recent Labs  Lab 10/14/18 1746  10/14/18 1920  10/16/18 0308 10/17/18 0311  WBC 4.9  --  15.5*  --  13.8* 14.0*  NEUTROABS 4.3  --  13.7*  --   --   --   HGB 3.8*   < > 12.7*   < > 11.0* 12.1*  HCT 13.1*   < > 40.2   < > 35.5* 39.1  MCV 104.0*  --  94.4  --  94.4 93.8  PLT 86*  --  290  --  311 367   < > = values in this interval not displayed.

## 2018-10-17 NOTE — Progress Notes (Addendum)
Reviewed cath from today which showed 40% D1 in the ostium and 40% prox LAD and 50% side branch in D2.  No significant CAD and normal to low normal LVEDP and normal LVF.  Recommend aggressive risk factor modification.  Start ASA 81mg  daily and continue BB.  Start Lipitor 10mg  daily and get FLP and ALT in 6 weeks. Followup with PCP for elevated HbA1C.   [facility] will sign off.   Medication Recommendations:  ASA 81mg  daily, Lipitor 10mg  daily BP meds per [facility] Other recommendations (labs, testing, etc):  FLp and ALT in 6 weeks  Follow up as an outpatient:  PCP for followup of Hyperlipidemia and DM.  Followup in our office in 6 weeks

## 2018-10-17 NOTE — Interval H&P Note (Signed)
History and Physical Interval Note:  10/17/2018 9:33 AM  Fernando Jones  has presented today for surgery, with the diagnosis of elevated trop -possible non-ST elevation MI. Initial plans for CT angiogram of the heart were aborted because of difficulty with IV access.  The decision was then made to proceed with cardiac catheterization.    The various methods of treatment have been discussed with the patient and family. After consideration of risks, benefits and other options for treatment, the patient has consented to  Procedure(s): LEFT HEART CATH AND CORONARY ANGIOGRAPHY (N/A) with possible PERCUTANEOUS CORONARY INTERVENTION as a surgical intervention .  The patient's history has been reviewed, patient examined, no change in status, stable for surgery.  I have reviewed the patient's chart and labs.  Questions were answered to the patient's satisfaction.    Cath Lab Visit (complete for each Cath Lab visit)  Clinical Evaluation Leading to the Procedure:   ACS: Yes.    Non-ACS:    Anginal Classification: CCS III  Anti-ischemic medical therapy: Minimal Therapy (1 class of medications)  Non-Invasive Test Results: No non-invasive testing performed  Prior CABG: No previous CABG   Glenetta Hew

## 2018-10-17 NOTE — Progress Notes (Signed)
PROGRESS NOTE    Fernando Jones  AOZ:[id number] DOB: 05/28/1961 DOA: 10/14/2018 PCP: Patient, No Pcp Per    Brief Narrative:57 y.o.malewith a hx of ESRD on HD (T/Th/Sat), DM, HTN, GERD, tobacco abuse, and HLD who presented w/ L chest pain and body aches.  Assessment & Plan:   Active Problems:   DM (diabetes mellitus), type 2 with renal complications (HCC)   TOBACCO ABUSE   Essential hypertension   ESRD (end stage renal disease) (HCC)   Chest pain   Atypical chest pain: Improved,Cardiology consulted patient underwent cardiac catheterization showing proximal LAD lesion about 40% stenosis and 50% stenosis of the OST second diag.  Mild to moderate LAD and diagonal  disease not significant stenosis.  Normal EF by echocardiogram medical management for microvascular disease recommend to continue with aspirin 81 mg. CT angio of the chest, abdomen and pelvis shows atherosclerosis of the thoracic and abdominal aorta without aneurysm and dissection.  Resume aspirin and metoprolol.   ESRD on HD TTS: Nephrology consulted and recommendations given.    Hypertension:  Well controlled.   GERD Stable   Diabetes Mellitus type II controlled: CBG (last 3)  Recent Labs    10/16/18 2231 10/17/18 0606 10/17/18 1054  GLUCAP 87 102* 85   Resume SSi.    Tobacco abuse:  Counseled.     DVT prophylaxis: sq heparin.  Code Status:  Full code.  Family Communication: none at bedside.  Disposition Plan: Possible DC back to SNF in 24 hours   Consultants:   Cardiology.    Procedures cardiac catheterization on 10/17/2018   Antimicrobials: none.    Subjective: Patient reports feeling better no vomiting or abdominal pain  Objective: Vitals:   10/17/18 1009 10/17/18 1014 10/17/18 1019 10/17/18 1056  BP: 135/69 135/81 (!) 143/69 139/70  Pulse: (!) 101 100 100 99  Resp: 11 (!) 8 (!) 7 17  Temp:    98.4 F (36.9 C)  TempSrc:    Oral  SpO2: 99% 98% 98% 97%  Weight:        Height:        Intake/Output Summary (Last 24 hours) at 10/17/2018 1105 Last data filed at 10/17/2018 0700 Gross per 24 hour  Intake 240 ml  Output -  Net 240 ml   Filed Weights   10/14/18 1738 10/14/18 2356 10/16/18 1929  Weight: 70 kg 73.9 kg 78 kg    Examination:  General exam: Appears calm and comfortable not in any kind of distress Respiratory system: Clear to auscultation. Respiratory effort normal.  No wheezing or rhonchi Cardiovascular system: S1 & S2 heard, RRR. No JVD, murmurs, . No pedal edema. Gastrointestinal system: Abdomen is soft, nontender, nondistended with good bowel sounds Central nervous system: Alert and oriented.  Nonfocal Extremities: Symmetric 5 x 5 power. Skin: No rashes, lesions or ulcers Psychiatry: Mood appropriate    Data Reviewed: I have personally reviewed following labs and imaging studies  CBC: Recent Labs  Lab 10/14/18 1746 10/14/18 1758 10/14/18 1920 10/14/18 1946 10/16/18 0308 10/17/18 0311  WBC 4.9  --  15.5*  --  13.8* 14.0*  NEUTROABS 4.3  --  13.7*  --   --   --   HGB 3.8* 16.0 12.7* 14.6 11.0* 12.1*  HCT 13.1* 47.0 40.2 43.0 35.5* 39.1  MCV 104.0*  --  94.4  --  94.4 93.8  PLT 86*  --  290  --  311 962   Basic Metabolic Panel: Recent Labs  Lab 10/14/18 1920  10/14/18 1946 10/14/18 1952 10/15/18 0046 10/16/18 0308 10/17/18 0311  NA 136 137 138  --  133* 130*  K 3.7 3.8 4.0  --  5.2* 5.3*  CL 94* 95* 94*  --  93* 89*  CO2 28  --  30  --  25 19*  GLUCOSE 138* 133* 120*  --  114* 96  BUN 25* 27* 25*  --  55* 74*  CREATININE 6.13* 6.20* 6.20*  --  9.95* 12.34*  CALCIUM 9.2  --  9.4  --  9.0 9.3  MG  --   --   --  2.2  --   --   PHOS  --   --   --  4.6  --   --    GFR: Estimated Creatinine Clearance: 6.4 mL/min (A) (by C-G formula based on SCr of 12.34 mg/dL (H)). Liver Function Tests: Recent Labs  Lab 10/14/18 1952 10/16/18 0308  AST 20 14*  ALT 27 19  ALKPHOS 57 60  BILITOT 0.8 0.6  PROT 7.8 6.5   ALBUMIN 3.7 3.0*   Recent Labs  Lab 10/14/18 1952  LIPASE 29   No results for input(s): AMMONIA in the last 168 hours. Coagulation Profile: No results for input(s): INR, PROTIME in the last 168 hours. Cardiac Enzymes: Recent Labs  Lab 10/14/18 1952 10/14/18 2229 10/15/18 0431 10/15/18 0917  TROPONINI <0.03 0.15* 0.26* 0.20*   BNP (last 3 results) No results for input(s): PROBNP in the last 8760 hours. HbA1C: Recent Labs    10/15/18 0046  HGBA1C 7.0*   CBG: Recent Labs  Lab 10/16/18 1109 10/16/18 1633 10/16/18 2231 10/17/18 0606 10/17/18 1054  GLUCAP 160* 89 87 102* 85   Lipid Profile: No results for input(s): CHOL, HDL, LDLCALC, TRIG, CHOLHDL, LDLDIRECT in the last 72 hours. Thyroid Function Tests: No results for input(s): TSH, T4TOTAL, FREET4, T3FREE, THYROIDAB in the last 72 hours. Anemia Panel: No results for input(s): VITAMINB12, FOLATE, FERRITIN, TIBC, IRON, RETICCTPCT in the last 72 hours. Sepsis Labs: No results for input(s): PROCALCITON, LATICACIDVEN in the last 168 hours.  Recent Results (from the past 240 hour(s))  MRSA PCR Screening     Status: Abnormal   Collection Time: 10/15/18 12:54 AM  Result Value Ref Range Status   MRSA by PCR POSITIVE (A) NEGATIVE Final    Comment: RESULT CALLED TO, READ BACK BY AND VERIFIED WITH: RN TIM IRBY [id number] @0520  THANEY        The GeneXpert MRSA Assay (FDA approved for NASAL specimens only), is one component of a comprehensive MRSA colonization surveillance program. It is not intended to diagnose MRSA infection nor to guide or monitor treatment for MRSA infections. Performed at [facility], [place] [address] [place], [place] [address]          Radiology Studies: Ct Angio Chest Aorta W/cm &/or Wo/cm  Result Date: 10/15/2018 CLINICAL DATA:  Chest and abdominal pain. EXAM: CT ANGIOGRAPHY CHEST, ABDOMEN AND PELVIS TECHNIQUE: Multidetector CT imaging through the chest, abdomen and pelvis was  performed using the standard protocol during bolus administration of intravenous contrast. Multiplanar reconstructed images and MIPs were obtained and reviewed to evaluate the vascular anatomy. CONTRAST:  145mL ISOVUE-370 IOPAMIDOL (ISOVUE-370) INJECTION 76% COMPARISON:  CT scan of December 06, 2017. FINDINGS: CTA CHEST FINDINGS Cardiovascular: Atherosclerosis of thoracic aorta is noted without aneurysm or dissection. Normal cardiac size. No pericardial effusion. Coronary artery calcifications are noted. Mediastinum/Nodes: No enlarged mediastinal, hilar, or axillary lymph nodes. Thyroid gland,  trachea, and esophagus demonstrate no significant findings. Lungs/Pleura: No pneumothorax or pleural effusion is noted. Left lung is clear. 7 mm nodule is noted in right lung base best seen on image number 116 of series 6. Musculoskeletal: No chest wall abnormality. No acute or significant osseous findings. Review of the MIP images confirms the above findings. CTA ABDOMEN AND PELVIS FINDINGS VASCULAR Aorta: Atherosclerosis of abdominal aorta is noted without aneurysm or dissection. Celiac: Patent without evidence of aneurysm, dissection, vasculitis or significant stenosis. SMA: Patent without evidence of aneurysm, dissection, vasculitis or significant stenosis. Renals: Moderate to severe atheromatous disease is seen diffusely involving both renal arteries. IMA: Patent without evidence of aneurysm, dissection, vasculitis or significant stenosis. Inflow: Moderate eccentric stenosis is seen involving the proximal left common iliac artery. Multifocal moderate stenoses are noted throughout both external iliac arteries. Veins: No obvious venous abnormality within the limitations of this arterial phase study. Review of the MIP images confirms the above findings. NON-VASCULAR Hepatobiliary: No focal liver abnormality is seen. No gallstones, gallbladder wall thickening, or biliary dilatation. Pancreas: Unremarkable. No pancreatic ductal  dilatation or surrounding inflammatory changes. Spleen: Normal in size without focal abnormality. Adrenals/Urinary Tract: Adrenal glands appear normal. Bilateral renal atrophy is noted consistent with history of end-stage renal disease. No hydronephrosis or renal obstruction is noted. Urinary bladder is decompressed. Stomach/Bowel: The stomach appears normal. There is no evidence of bowel obstruction or inflammation. Status post appendectomy. Stool is noted throughout the colon. Lymphatic: No significant adenopathy is noted. Reproductive: Prostate is unremarkable. Other: No abdominal wall hernia or abnormality. No abdominopelvic ascites. Musculoskeletal: No acute or significant osseous findings. Review of the MIP images confirms the above findings. IMPRESSION: Atherosclerosis of thoracic and abdominal aorta is noted without aneurysm or dissection. Coronary artery calcifications are noted suggesting coronary artery disease. 7 mm nodule is noted in right lung base. Non-contrast chest CT at 6-12 months is recommended. If the nodule is stable at time of repeat CT, then future CT at 18-24 months (from today's scan) is considered optional for low-risk patients, but is recommended for high-risk patients. This recommendation follows the consensus statement: Guidelines for Management of Incidental Pulmonary Nodules Detected on CT Images: From the Fleischner Society 2017; Radiology 2017; 284:228-243. No evidence of significant mesenteric artery stenosis. Moderate diffuse atheromatous disease is seen involving both renal arteries, with bilateral renal atrophy consistent with end-stage renal disease. Moderate eccentric stenosis is seen involving the proximal left common iliac artery. Multifocal moderate stenoses are noted throughout both external iliac arteries. Electronically Signed   By: Marijo Conception, M.D.   On: 10/15/2018 20:08   Ct Angio Abd/pel W/ And/or W/o  Result Date: 10/15/2018 CLINICAL DATA:  Chest and  abdominal pain. EXAM: CT ANGIOGRAPHY CHEST, ABDOMEN AND PELVIS TECHNIQUE: Multidetector CT imaging through the chest, abdomen and pelvis was performed using the standard protocol during bolus administration of intravenous contrast. Multiplanar reconstructed images and MIPs were obtained and reviewed to evaluate the vascular anatomy. CONTRAST:  131mL ISOVUE-370 IOPAMIDOL (ISOVUE-370) INJECTION 76% COMPARISON:  CT scan of December 06, 2017. FINDINGS: CTA CHEST FINDINGS Cardiovascular: Atherosclerosis of thoracic aorta is noted without aneurysm or dissection. Normal cardiac size. No pericardial effusion. Coronary artery calcifications are noted. Mediastinum/Nodes: No enlarged mediastinal, hilar, or axillary lymph nodes. Thyroid gland, trachea, and esophagus demonstrate no significant findings. Lungs/Pleura: No pneumothorax or pleural effusion is noted. Left lung is clear. 7 mm nodule is noted in right lung base best seen on image number 116 of series 6. Musculoskeletal: No  chest wall abnormality. No acute or significant osseous findings. Review of the MIP images confirms the above findings. CTA ABDOMEN AND PELVIS FINDINGS VASCULAR Aorta: Atherosclerosis of abdominal aorta is noted without aneurysm or dissection. Celiac: Patent without evidence of aneurysm, dissection, vasculitis or significant stenosis. SMA: Patent without evidence of aneurysm, dissection, vasculitis or significant stenosis. Renals: Moderate to severe atheromatous disease is seen diffusely involving both renal arteries. IMA: Patent without evidence of aneurysm, dissection, vasculitis or significant stenosis. Inflow: Moderate eccentric stenosis is seen involving the proximal left common iliac artery. Multifocal moderate stenoses are noted throughout both external iliac arteries. Veins: No obvious venous abnormality within the limitations of this arterial phase study. Review of the MIP images confirms the above findings. NON-VASCULAR Hepatobiliary: No  focal liver abnormality is seen. No gallstones, gallbladder wall thickening, or biliary dilatation. Pancreas: Unremarkable. No pancreatic ductal dilatation or surrounding inflammatory changes. Spleen: Normal in size without focal abnormality. Adrenals/Urinary Tract: Adrenal glands appear normal. Bilateral renal atrophy is noted consistent with history of end-stage renal disease. No hydronephrosis or renal obstruction is noted. Urinary bladder is decompressed. Stomach/Bowel: The stomach appears normal. There is no evidence of bowel obstruction or inflammation. Status post appendectomy. Stool is noted throughout the colon. Lymphatic: No significant adenopathy is noted. Reproductive: Prostate is unremarkable. Other: No abdominal wall hernia or abnormality. No abdominopelvic ascites. Musculoskeletal: No acute or significant osseous findings. Review of the MIP images confirms the above findings. IMPRESSION: Atherosclerosis of thoracic and abdominal aorta is noted without aneurysm or dissection. Coronary artery calcifications are noted suggesting coronary artery disease. 7 mm nodule is noted in right lung base. Non-contrast chest CT at 6-12 months is recommended. If the nodule is stable at time of repeat CT, then future CT at 18-24 months (from today's scan) is considered optional for low-risk patients, but is recommended for high-risk patients. This recommendation follows the consensus statement: Guidelines for Management of Incidental Pulmonary Nodules Detected on CT Images: From the Fleischner Society 2017; Radiology 2017; 284:228-243. No evidence of significant mesenteric artery stenosis. Moderate diffuse atheromatous disease is seen involving both renal arteries, with bilateral renal atrophy consistent with end-stage renal disease. Moderate eccentric stenosis is seen involving the proximal left common iliac artery. Multifocal moderate stenoses are noted throughout both external iliac arteries. Electronically Signed    By: Marijo Conception, M.D.   On: 10/15/2018 20:08        Scheduled Meds: . aspirin EC  81 mg Oral Daily  . Chlorhexidine Gluconate Cloth  6 each Topical Q0600  . doxercalciferol  3 mcg Intravenous Q T,Th,Sa-HD  . heparin injection (subcutaneous)  5,000 Units Subcutaneous Q8H  . insulin aspart  0-9 Units Subcutaneous TID WC  . midodrine  10 mg Oral Q T,Th,Sa-HD  . mupirocin ointment  1 application Nasal BID  . pantoprazole  40 mg Oral BID AC  . rOPINIRole  0.25-0.5 mg Oral QHS   Continuous Infusions:   LOS: 1 day    Time spent: 25 minutes    Hosie Poisson, MD [facility] Pager [phone] If 7PM-7AM, please contact night-coverage [email or web address] Password [email or web address] 10/17/2018, 11:05 AM

## 2018-10-18 ENCOUNTER — Inpatient Hospital Stay (HOSPITAL_COMMUNITY): Payer: Self-pay

## 2018-10-18 DIAGNOSIS — R1011 Right upper quadrant pain: Secondary | ICD-10-CM

## 2018-10-18 LAB — CBC
HCT: 33 % — ABNORMAL LOW (ref 39.0–52.0)
Hemoglobin: 10.6 g/dL — ABNORMAL LOW (ref 13.0–17.0)
MCH: 29.7 pg (ref 26.0–34.0)
MCHC: 32.1 g/dL (ref 30.0–36.0)
MCV: 92.4 fL (ref 80.0–100.0)
NRBC: 0 % (ref 0.0–0.2)
Platelets: 309 10*3/uL (ref 150–400)
RBC: 3.57 MIL/uL — AB (ref 4.22–5.81)
RDW: 15.5 % (ref 11.5–15.5)
WBC: 11.6 10*3/uL — AB (ref 4.0–10.5)

## 2018-10-18 LAB — GLUCOSE, CAPILLARY
GLUCOSE-CAPILLARY: 113 mg/dL — AB (ref 70–99)
GLUCOSE-CAPILLARY: 108 mg/dL — AB (ref 70–99)
Glucose-Capillary: 137 mg/dL — ABNORMAL HIGH (ref 70–99)
Glucose-Capillary: 80 mg/dL (ref 70–99)

## 2018-10-18 MED ORDER — CHLORHEXIDINE GLUCONATE CLOTH 2 % EX PADS: 6.0000 | MEDICATED_PAD | Freq: Every day | CUTANEOUS | Status: DC

## 2018-10-18 NOTE — Progress Notes (Signed)
[facility] Progress Note  Subjective: c/o RUQ pain today  Vitals:   10/17/18 1933 10/17/18 2333 10/18/18 0445 10/18/18 0752  BP: (!) 98/59 (!) 100/51 (!) 117/56 (!) 108/58  Pulse: (!) 101 (!) 102 (!) 101 91  Resp: (!) 23 (!) 26 16 18   Temp: 99 F (37.2 C) 98.8 F (37.1 C) 99.1 F (37.3 C) 98.7 F (37.1 C)  TempSrc: Oral Oral Oral Oral  SpO2: 98% 100% 98% 94%  Weight:      Height:        Inpatient medications: . aspirin EC  81 mg Oral Daily  . atorvastatin  10 mg Oral q1800  . Chlorhexidine Gluconate Cloth  6 each Topical Q0600  . doxercalciferol  3 mcg Intravenous Q T,Th,Sa-HD  . heparin injection (subcutaneous)  5,000 Units Subcutaneous Q8H  . insulin aspart  0-9 Units Subcutaneous TID WC  . midodrine  10 mg Oral Q T,Th,Sa-HD  . mupirocin ointment  1 application Nasal BID  . pantoprazole  40 mg Oral BID AC  . rOPINIRole  0.25-0.5 mg Oral QHS  . sodium chloride flush  3 mL Intravenous Q12H   . sodium chloride 10 mL/hr at 10/17/18 1719   sodium chloride, acetaminophen, loperamide, ondansetron (ZOFRAN) IV, oxyCODONE, sodium chloride flush  Iron/TIBC/Ferritin/ %Sat    Component Value Date/Time   IRON 39 (L) 12/07/2017 0349   TIBC 178 (L) 12/07/2017 0349   FERRITIN 189 12/07/2017 0349   IRONPCTSAT 22 12/07/2017 0349    Exam: General: alert  Hispanic Male NAD, no distress HEENT:  , EOMI, Not icteric, MMM Neck: no jvd  Heart: RRR.  No m,r,g Lungs: CTA , no r,c,w Abdomen: BS +, mild tenderness, soft, no ascites , no hsm,  no rebound Extremities: No pedal edema  Skin: no overt rash,  Neuro: OX3, moves all extrem  Dialysis Access: Pos . Bruit RFA AVF  Dialysis: [facility]TS 4h  2/2.25  74kg   P2   RFA AVF  Hep 2000 Hectorol 3 mcg IV/HD  Last Mircera  75 mcg 10/12/18 with last hgb11.6  10/12/18 and no ESA since 09/07/18      Assessment/Plan 1. ESRD -  HD TTS   2. Abd pain/ new diarrhea - RUQ, had CT abd already here w/o any sig changes/  stones, etc. Having diarrhea now, ordered Cdif.  3. Chest pain - sp LHC w/o sig CAD 4. Hypertension/volume - patient on midodrine pre HD , all his HTN meds were stopped at OP HD recently due to Low bp's 5. Anemia  - hgb 11.0  No esa as op  6. Metabolic bone disease -  Auryxia as  Phos bind and on hectorol  7. DM type 2 per admit  8. Nutrition - Renal carb mod , rena vit     [name] MD [facility] pager [phone]   10/18/2018, 10:34 AM     Recent Labs  Lab 10/14/18 1952 10/15/18 0046 10/16/18 0308 10/17/18 0311  NA 138  --  133* 130*  K 4.0  --  5.2* 5.3*  CL 94*  --  93* 89*  CO2 30  --  25 19*  GLUCOSE 120*  --  114* 96  BUN 25*  --  55* 74*  CREATININE 6.20*  --  9.95* 12.34*  CALCIUM 9.4  --  9.0 9.3  PHOS  --  4.6  --   --   ALBUMIN 3.7  --  3.0*  --  Recent Labs  Lab 10/14/18 1952 10/16/18 0308  AST 20 14*  ALT 27 19  ALKPHOS 57 60  BILITOT 0.8 0.6  PROT 7.8 6.5   Recent Labs  Lab 10/14/18 1746  10/14/18 1920  10/17/18 0311 10/18/18 0259  WBC 4.9  --  15.5*   < > 14.0* 11.6*  NEUTROABS 4.3  --  13.7*  --   --   --   HGB 3.8*   < > 12.7*   < > 12.1* 10.6*  HCT 13.1*   < > 40.2   < > 39.1 33.0*  MCV 104.0*  --  94.4   < > 93.8 92.4  PLT 86*  --  290   < > 367 309   < > = values in this interval not displayed.

## 2018-10-18 NOTE — Progress Notes (Signed)
Order in place to collect c-diff sample. Pt has had no bowel movement today. MD aware.

## 2018-10-18 NOTE — Progress Notes (Signed)
PROGRESS NOTE    Fernando Jones  JKD:[id number] DOB: 07/12/1961 DOA: 10/14/2018 PCP: Patient, No Pcp Per   Brief Narrative: Fernando Jones is a 57 y.o. malewitha hxof ESRD on HD (T/Th/Sat),DM, HTN, GERD, tobacco abuse,andHLD. He presented with left sided chest pain concerning for ACS. ACS ruled out but now with RUQ pain.   Assessment & Plan:   Active Problems:   DM (diabetes mellitus), type 2 with renal complications (HCC)   TOBACCO ABUSE   Essential hypertension   ESRD (end stage renal disease) (HCC)   Chest pain   Chest pain Unknown etiology. Atypical. Patient underwent left heart cath on 11/12 significant for mild-moderate disease. Chest pain resolved. -Cardiology recommendations: medical management  CAD Underwent left heart cath significant for mild-moderate LAD/diagonal disease -Cardiology recommendations: Medical management/outpatient follow-up  RUQ abdominal pain Appears to be new per patient. Associated diarrhea. No recent antibiotics. Last regular bowel movement was three days ago. Recent CT abdomen significant for stool with normal appearing gallbladder and liver. -C. Difficile ordered and is pending -Continue oxycodone prn -Abdominal x-ray  Diarrhea No recent antibiotics. Afebrile, elevated WBC, which has trended down slightly. C. Difficile test previously placed and is pending.  Diabetes mellitus, type 2 Patient with a remote history. Not on medication as an outpatient. Current hemoglobin A1C of 7.0%.   ESRD on HD HD every TTS -Per nephrology  Essential hypertension Blood pressure on the lower side. On carvedilol and hydralazine as an outpatient. -Continue to hold antihypertensives.  Lung nodule Incidental finding. Recommendations for repeat non-contrast CT as an outpatient in 6-12 months  Pressure Injury Documentation:     DVT prophylaxis: Heparin subq Code Status:   Code Status: Full Code Family Communication: None at  bedside Disposition Plan: Discharge pending improvement of abdominal pain   Consultants:   Nephrology  Cardiology  Procedures:   Transthoracic Echocardiogram (11/10)  Study Conclusions  - Left ventricle: The cavity size was normal. Wall thickness was   increased in a pattern of mild LVH. Systolic function was normal.   The estimated ejection fraction was in the range of 60% to 65%.   Wall motion was normal; there were no regional wall motion   abnormalities. Doppler parameters are consistent with abnormal   left ventricular relaxation (grade 1 diastolic dysfunction). - Aortic valve: Mildly calcified annulus. Trileaflet; mildly   calcified leaflets. - Mitral valve: Mildly calcified annulus. There was trivial   regurgitation. - Right atrium: Central venous pressure (est): 3 mm Hg. - Tricuspid valve: There was trivial regurgitation. - Pulmonary arteries: Systolic pressure could not be accurately   estimated. - Pericardium, extracardiac: A small pericardial effusion was   identified circumferential to the heart with signs of   organization versus chronicity.   Left heart cath (09/98) LV end diastolic pressure is normal. There is no aortic valve stenosis. Ost 1st Diag lesion is 40% stenosed. Prox LAD lesion is 40% stenosed with 50% stenosed side branch in Ost 2nd Diag.   SUMMARY Mild to moderate LAD and diagonal disease, but no significant stenosis. Low to normal LVEDP.  Normal EF by echo.   RECOMMENDATIONS Return to nursing care for monitoring post cath.  Medical management for possible microvascular disease. Recommend Aspirin 81mg  daily for moderate CAD.   Antimicrobials:  None    Subjective: RUQ pain. Sharp. Started overnight. No radiation. Oxycodone helping with pain. Associated diarrhea.  Objective: Vitals:   10/17/18 1933 10/17/18 2333 10/18/18 0445 10/18/18 0752  BP: (!) 98/59 (!) 100/51 Marland Kitchen)  117/56 (!) 108/58  Pulse: (!) 101 (!) 102 (!) 101 91   Resp: (!) 23 (!) 26 16 18   Temp: 99 F (37.2 C) 98.8 F (37.1 C) 99.1 F (37.3 C) 98.7 F (37.1 C)  TempSrc: Oral Oral Oral Oral  SpO2: 98% 100% 98% 94%  Weight:      Height:        Intake/Output Summary (Last 24 hours) at 10/18/2018 0934 Last data filed at 10/17/2018 2230 Gross per 24 hour  Intake 537.84 ml  Output 951 ml  Net -413.16 ml   Filed Weights   10/16/18 1929 10/17/18 1241 10/17/18 1657  Weight: 78 kg 77.7 kg 76.9 kg    Examination:  General exam: Appears calm and comfortable Respiratory system: Clear to auscultation. Respiratory effort normal. Cardiovascular system: S1 & S2 heard, RRR. No murmurs, rubs, gallops or clicks. Gastrointestinal system: Abdomen is nondistended, soft and tender in RUQ. No organomegaly or masses felt. Normal bowel sounds heard. Central nervous system: Alert and oriented. No focal neurological deficits. Extremities: No edema. No calf tenderness Skin: No cyanosis. No rashes Psychiatry: Judgement and insight appear normal. Mood & affect appropriate.     Data Reviewed: I have personally reviewed following labs and imaging studies  CBC: Recent Labs  Lab 10/14/18 1746  10/14/18 1920 10/14/18 1946 10/16/18 0308 10/17/18 0311 10/18/18 0259  WBC 4.9  --  15.5*  --  13.8* 14.0* 11.6*  NEUTROABS 4.3  --  13.7*  --   --   --   --   HGB 3.8*   < > 12.7* 14.6 11.0* 12.1* 10.6*  HCT 13.1*   < > 40.2 43.0 35.5* 39.1 33.0*  MCV 104.0*  --  94.4  --  94.4 93.8 92.4  PLT 86*  --  290  --  311 367 309   < > = values in this interval not displayed.   Basic Metabolic Panel: Recent Labs  Lab 10/14/18 1920 10/14/18 1946 10/14/18 1952 10/15/18 0046 10/16/18 0308 10/17/18 0311  NA 136 137 138  --  133* 130*  K 3.7 3.8 4.0  --  5.2* 5.3*  CL 94* 95* 94*  --  93* 89*  CO2 28  --  30  --  25 19*  GLUCOSE 138* 133* 120*  --  114* 96  BUN 25* 27* 25*  --  55* 74*  CREATININE 6.13* 6.20* 6.20*  --  9.95* 12.34*  CALCIUM 9.2  --  9.4  --   9.0 9.3  MG  --   --   --  2.2  --   --   PHOS  --   --   --  4.6  --   --    GFR: Estimated Creatinine Clearance: 6.4 mL/min (A) (by C-G formula based on SCr of 12.34 mg/dL (H)). Liver Function Tests: Recent Labs  Lab 10/14/18 1952 10/16/18 0308  AST 20 14*  ALT 27 19  ALKPHOS 57 60  BILITOT 0.8 0.6  PROT 7.8 6.5  ALBUMIN 3.7 3.0*   Recent Labs  Lab 10/14/18 1952  LIPASE 29   No results for input(s): AMMONIA in the last 168 hours. Coagulation Profile: No results for input(s): INR, PROTIME in the last 168 hours. Cardiac Enzymes: Recent Labs  Lab 10/14/18 1952 10/14/18 2229 10/15/18 0431 10/15/18 0917  TROPONINI <0.03 0.15* 0.26* 0.20*   BNP (last 3 results) No results for input(s): PROBNP in the last 8760 hours. HbA1C: No results for input(s): HGBA1C in  the last 72 hours. CBG: Recent Labs  Lab 10/17/18 0606 10/17/18 1054 10/17/18 1715 10/17/18 2106 10/18/18 0628  GLUCAP 102* 85 149* 111* 113*   Lipid Profile: No results for input(s): CHOL, HDL, LDLCALC, TRIG, CHOLHDL, LDLDIRECT in the last 72 hours. Thyroid Function Tests: No results for input(s): TSH, T4TOTAL, FREET4, T3FREE, THYROIDAB in the last 72 hours. Anemia Panel: No results for input(s): VITAMINB12, FOLATE, FERRITIN, TIBC, IRON, RETICCTPCT in the last 72 hours. Sepsis Labs: No results for input(s): PROCALCITON, LATICACIDVEN in the last 168 hours.  Recent Results (from the past 240 hour(s))  MRSA PCR Screening     Status: Abnormal   Collection Time: 10/15/18 12:54 AM  Result Value Ref Range Status   MRSA by PCR POSITIVE (A) NEGATIVE Final    Comment: RESULT CALLED TO, READ BACK BY AND VERIFIED WITH: RN TIM IRBY [id number] @0520  THANEY        The GeneXpert MRSA Assay (FDA approved for NASAL specimens only), is one component of a comprehensive MRSA colonization surveillance program. It is not intended to diagnose MRSA infection nor to guide or monitor treatment for MRSA  infections. Performed at [facility], [place] [address], [place], [place] [address]          Radiology Studies: No results found.      Scheduled Meds: . aspirin EC  81 mg Oral Daily  . atorvastatin  10 mg Oral q1800  . Chlorhexidine Gluconate Cloth  6 each Topical Q0600  . doxercalciferol  3 mcg Intravenous Q T,Th,Sa-HD  . heparin injection (subcutaneous)  5,000 Units Subcutaneous Q8H  . insulin aspart  0-9 Units Subcutaneous TID WC  . midodrine  10 mg Oral Q T,Th,Sa-HD  . mupirocin ointment  1 application Nasal BID  . pantoprazole  40 mg Oral BID AC  . rOPINIRole  0.25-0.5 mg Oral QHS  . sodium chloride flush  3 mL Intravenous Q12H   Continuous Infusions: . sodium chloride 10 mL/hr at 10/17/18 1719     LOS: 2 days     Cordelia Poche, MD [facility] 10/18/2018, 9:34 AM  If 7PM-7AM, please contact night-coverage [email or web address]

## 2018-10-19 ENCOUNTER — Inpatient Hospital Stay (HOSPITAL_COMMUNITY): Payer: Self-pay

## 2018-10-19 DIAGNOSIS — E1122 Type 2 diabetes mellitus with diabetic chronic kidney disease: Secondary | ICD-10-CM

## 2018-10-19 DIAGNOSIS — Z992 Dependence on renal dialysis: Secondary | ICD-10-CM

## 2018-10-19 LAB — RENAL FUNCTION PANEL
ANION GAP: 18 — AB (ref 5–15)
Albumin: 2.7 g/dL — ABNORMAL LOW (ref 3.5–5.0)
BUN: 55 mg/dL — AB (ref 6–20)
CALCIUM: 8.7 mg/dL — AB (ref 8.9–10.3)
CO2: 20 mmol/L — AB (ref 22–32)
Chloride: 94 mmol/L — ABNORMAL LOW (ref 98–111)
Creatinine, Ser: 10.27 mg/dL — ABNORMAL HIGH (ref 0.61–1.24)
GFR calc Af Amer: 6 mL/min — ABNORMAL LOW (ref >60)
GFR calc non Af Amer: 5 mL/min — ABNORMAL LOW (ref >60)
GLUCOSE: 107 mg/dL — AB (ref 70–99)
Phosphorus: 6.4 mg/dL — ABNORMAL HIGH (ref 2.5–4.6)
Potassium: 4.9 mmol/L (ref 3.5–5.1)
Sodium: 132 mmol/L — ABNORMAL LOW (ref 135–145)

## 2018-10-19 LAB — CBC
HEMATOCRIT: 33.5 % — AB (ref 39.0–52.0)
Hemoglobin: 10.4 g/dL — ABNORMAL LOW (ref 13.0–17.0)
MCH: 28.9 pg (ref 26.0–34.0)
MCHC: 31 g/dL (ref 30.0–36.0)
MCV: 93.1 fL (ref 80.0–100.0)
NRBC: 0 % (ref 0.0–0.2)
PLATELETS: 351 10*3/uL (ref 150–400)
RBC: 3.6 MIL/uL — AB (ref 4.22–5.81)
RDW: 15.5 % (ref 11.5–15.5)
WBC: 15.6 10*3/uL — ABNORMAL HIGH (ref 4.0–10.5)

## 2018-10-19 LAB — GLUCOSE, CAPILLARY
GLUCOSE-CAPILLARY: 125 mg/dL — AB (ref 70–99)
GLUCOSE-CAPILLARY: 104 mg/dL — AB (ref 70–99)
Glucose-Capillary: 100 mg/dL — ABNORMAL HIGH (ref 70–99)
Glucose-Capillary: 103 mg/dL — ABNORMAL HIGH (ref 70–99)
Glucose-Capillary: 134 mg/dL — ABNORMAL HIGH (ref 70–99)

## 2018-10-19 MED ORDER — HEPARIN SODIUM (PORCINE) 1000 UNIT/ML IJ SOLN
INTRAMUSCULAR | Status: AC
  Administered 2018-10-19: 2000 [IU] via INTRAVENOUS_CENTRAL
  Filled 2018-10-19: qty 2

## 2018-10-19 MED ORDER — HYDROMORPHONE HCL 1 MG/ML IJ SOLN
1.0000 mg | INTRAMUSCULAR | Status: DC | PRN
  Administered 2018-10-19 (×2): 1 mg via INTRAVENOUS

## 2018-10-19 MED ORDER — HEPARIN SODIUM (PORCINE) 1000 UNIT/ML DIALYSIS
2000.0000 [IU] | Freq: Once | INTRAMUSCULAR | Status: AC
  Administered 2018-10-19: 2000 [IU] via INTRAVENOUS_CENTRAL

## 2018-10-19 MED ORDER — DOXERCALCIFEROL 4 MCG/2ML IV SOLN
INTRAVENOUS | Status: AC
  Administered 2018-10-19: 3 ug via INTRAVENOUS
  Filled 2018-10-19: qty 2

## 2018-10-19 MED ORDER — MIDODRINE HCL 5 MG PO TABS
ORAL_TABLET | ORAL | Status: AC
  Administered 2018-10-19: 10 mg via ORAL
  Filled 2018-10-19: qty 2

## 2018-10-19 MED ORDER — POLYETHYLENE GLYCOL 3350 17 G PO PACK: 17.0000 g | PACK | Freq: Every day | ORAL | Status: DC

## 2018-10-19 MED ORDER — HYDROMORPHONE HCL 1 MG/ML IJ SOLN
INTRAMUSCULAR | Status: AC
  Filled 2018-10-19: qty 1

## 2018-10-19 MED ORDER — FERRIC CITRATE 1 GM 210 MG(FE) PO TABS
210.0000 mg | ORAL_TABLET | Freq: Three times a day (TID) | ORAL | Status: DC
  Administered 2018-10-19 – 2018-10-25 (×15): 210 mg via ORAL
  Filled 2018-10-19 (×22): qty 1

## 2018-10-19 MED ORDER — HYDROMORPHONE HCL 1 MG/ML IJ SOLN
INTRAMUSCULAR | Status: AC
  Administered 2018-10-19: 1 mg via INTRAVENOUS
  Filled 2018-10-19: qty 1

## 2018-10-19 MED ORDER — HYDROMORPHONE HCL 1 MG/ML IJ SOLN
1.0000 mg | INTRAMUSCULAR | Status: AC | PRN
  Administered 2018-10-20 – 2018-10-21 (×2): 1 mg via INTRAVENOUS
  Filled 2018-10-19 (×2): qty 1

## 2018-10-19 NOTE — Progress Notes (Signed)
PROGRESS NOTE    Fernando Jones  CBS:[id number] DOB: [birth year] DOA: 10/14/2018 PCP: Patient, No Pcp Per   Brief Narrative: Fernando Jones is a 57 y.o. malewitha hxof ESRD on HD (T/Th/Sat),DM, HTN, GERD, tobacco abuse,andHLD. He presented with left sided chest pain concerning for ACS. ACS ruled out but now with RUQ pain.   Assessment & Plan:   Active Problems:   DM (diabetes mellitus), type 2 with renal complications (HCC)   TOBACCO ABUSE   Essential hypertension   ESRD (end stage renal disease) (HCC)   Chest pain   Chest pain Unknown etiology. Atypical. Patient underwent left heart cath on 11/12 significant for mild-moderate disease. Chest pain resolved. -Cardiology recommendations: medical management  CAD Underwent left heart cath significant for mild-moderate LAD/diagonal disease -Cardiology recommendations: Medical management/outpatient follow-up  RUQ abdominal pain Appears to be new per patient. Associated diarrhea. No recent antibiotics. Last regular bowel movement was four days ago. Recent CT abdomen significant for stool with normal appearing gallbladder and liver. No recurrent bowel movement. Pain still present -Discontinue C. Difficile order -Continue oxycodone prn -RUQ ultrasound  Diarrhea No recent antibiotics. Afebrile, elevated WBC, which has trended down slightly. C. Difficile test previously placed. No recurrent diarrhea. -Discontinued C. Difficile order  Diabetes mellitus, type 2 Patient with a remote history. Not on medication as an outpatient. Current hemoglobin A1C of 7.0%.  -Continue SSI  ESRD on HD HD every TTS -Per nephrology  Essential hypertension Blood pressure on the lower side. On carvedilol and hydralazine as an outpatient. -Continue to hold antihypertensives.  Lung nodule Incidental finding. Recommendations for repeat non-contrast CT as an outpatient in 6-12 months  Pressure Injury Documentation:     DVT  prophylaxis: Heparin subq Code Status:   Code Status: Full Code Family Communication: None at bedside Disposition Plan: Discharge pending workup/improvement of abdominal pain   Consultants:   Nephrology  Cardiology  Procedures:   Transthoracic Echocardiogram (11/10)  Study Conclusions  - Left ventricle: The cavity size was normal. Wall thickness was   increased in a pattern of mild LVH. Systolic function was normal.   The estimated ejection fraction was in the range of 60% to 65%.   Wall motion was normal; there were no regional wall motion   abnormalities. Doppler parameters are consistent with abnormal   left ventricular relaxation (grade 1 diastolic dysfunction). - Aortic valve: Mildly calcified annulus. Trileaflet; mildly   calcified leaflets. - Mitral valve: Mildly calcified annulus. There was trivial   regurgitation. - Right atrium: Central venous pressure (est): 3 mm Hg. - Tricuspid valve: There was trivial regurgitation. - Pulmonary arteries: Systolic pressure could not be accurately   estimated. - Pericardium, extracardiac: A small pericardial effusion was   identified circumferential to the heart with signs of   organization versus chronicity.   Left heart cath (84/66) LV end diastolic pressure is normal. There is no aortic valve stenosis. Ost 1st Diag lesion is 40% stenosed. Prox LAD lesion is 40% stenosed with 50% stenosed side branch in Ost 2nd Diag.   SUMMARY Mild to moderate LAD and diagonal disease, but no significant stenosis. Low to normal LVEDP.  Normal EF by echo.   RECOMMENDATIONS Return to nursing care for monitoring post cath.  Medical management for possible microvascular disease. Recommend Aspirin 81mg  daily for moderate CAD.   Antimicrobials:  None    Subjective: Pain is still present and similar. Mild improvement with analgesics. No recurrent diarrhea. No bloody stools.  Objective: Vitals:   10/18/18  9211 10/18/18 1131  10/18/18 1952 10/19/18 0357  BP: (!) 108/58 (!) 115/53 128/60 117/61  Pulse: 91 95 94 (!) 115  Resp: 18 18 15 16   Temp: 98.7 F (37.1 C) 98.8 F (37.1 C) 98.2 F (36.8 C) 98.9 F (37.2 C)  TempSrc: Oral Oral Oral Axillary  SpO2: 94% 97% 100% 99%  Weight:    75.2 kg  Height:        Intake/Output Summary (Last 24 hours) at 10/19/2018 0939 Last data filed at 10/19/2018 0400 Gross per 24 hour  Intake 120 ml  Output -  Net 120 ml   Filed Weights   10/17/18 1241 10/17/18 1657 10/19/18 0357  Weight: 77.7 kg 76.9 kg 75.2 kg    Examination:  General exam: Appears calm and comfortable Respiratory system: Clear to auscultation. Respiratory effort normal. Cardiovascular system: S1 & S2 heard, RRR. No murmurs, rubs, gallops or clicks. Gastrointestinal system: Abdomen is nondistended, soft and tender in RUQ with mild guarding. No organomegaly or masses felt. Normal bowel sounds heard. Central nervous system: Alert and oriented. No focal neurological deficits. Extremities: No edema. No calf tenderness Skin: No cyanosis. No rashes Psychiatry: Judgement and insight appear normal. Mood & affect appropriate.     Data Reviewed: I have personally reviewed following labs and imaging studies  CBC: Recent Labs  Lab 10/14/18 1746  10/14/18 1920 10/14/18 1946 10/16/18 0308 10/17/18 0311 10/18/18 0259 10/19/18 0346  WBC 4.9  --  15.5*  --  13.8* 14.0* 11.6* 15.6*  NEUTROABS 4.3  --  13.7*  --   --   --   --   --   HGB 3.8*   < > 12.7* 14.6 11.0* 12.1* 10.6* 10.4*  HCT 13.1*   < > 40.2 43.0 35.5* 39.1 33.0* 33.5*  MCV 104.0*  --  94.4  --  94.4 93.8 92.4 93.1  PLT 86*  --  290  --  311 367 309 351   < > = values in this interval not displayed.   Basic Metabolic Panel: Recent Labs  Lab 10/14/18 1920 10/14/18 1946 10/14/18 1952 10/15/18 0046 10/16/18 0308 10/17/18 0311 10/19/18 0828  NA 136 137 138  --  133* 130* 132*  K 3.7 3.8 4.0  --  5.2* 5.3* 4.9  CL 94* 95* 94*  --   93* 89* 94*  CO2 28  --  30  --  25 19* 20*  GLUCOSE 138* 133* 120*  --  114* 96 107*  BUN 25* 27* 25*  --  55* 74* 55*  CREATININE 6.13* 6.20* 6.20*  --  9.95* 12.34* 10.27*  CALCIUM 9.2  --  9.4  --  9.0 9.3 8.7*  MG  --   --   --  2.2  --   --   --   PHOS  --   --   --  4.6  --   --  6.4*   GFR: Estimated Creatinine Clearance: 7.1 mL/min (A) (by C-G formula based on SCr of 10.27 mg/dL (H)). Liver Function Tests: Recent Labs  Lab 10/14/18 1952 10/16/18 0308 10/19/18 0828  AST 20 14*  --   ALT 27 19  --   ALKPHOS 57 60  --   BILITOT 0.8 0.6  --   PROT 7.8 6.5  --   ALBUMIN 3.7 3.0* 2.7*   Recent Labs  Lab 10/14/18 1952  LIPASE 29   No results for input(s): AMMONIA in the last 168 hours. Coagulation Profile: No  results for input(s): INR, PROTIME in the last 168 hours. Cardiac Enzymes: Recent Labs  Lab 10/14/18 1952 10/14/18 2229 10/15/18 0431 10/15/18 0917  TROPONINI <0.03 0.15* 0.26* 0.20*   BNP (last 3 results) No results for input(s): PROBNP in the last 8760 hours. HbA1C: No results for input(s): HGBA1C in the last 72 hours. CBG: Recent Labs  Lab 10/18/18 0628 10/18/18 1127 10/18/18 1700 10/18/18 2003 10/19/18 0625  GLUCAP 113* 108* 137* 80 100*   Lipid Profile: No results for input(s): CHOL, HDL, LDLCALC, TRIG, CHOLHDL, LDLDIRECT in the last 72 hours. Thyroid Function Tests: No results for input(s): TSH, T4TOTAL, FREET4, T3FREE, THYROIDAB in the last 72 hours. Anemia Panel: No results for input(s): VITAMINB12, FOLATE, FERRITIN, TIBC, IRON, RETICCTPCT in the last 72 hours. Sepsis Labs: No results for input(s): PROCALCITON, LATICACIDVEN in the last 168 hours.  Recent Results (from the past 240 hour(s))  MRSA PCR Screening     Status: Abnormal   Collection Time: 10/15/18 12:54 AM  Result Value Ref Range Status   MRSA by PCR POSITIVE (A) NEGATIVE Final    Comment: RESULT CALLED TO, READ BACK BY AND VERIFIED WITH: RN TIM IRBY [id number] @0520  THANEY         The GeneXpert MRSA Assay (FDA approved for NASAL specimens only), is one component of a comprehensive MRSA colonization surveillance program. It is not intended to diagnose MRSA infection nor to guide or monitor treatment for MRSA infections. Performed at [facility], [place] [address] [place], [place] [address]   Culture, blood (Routine X 2) w Reflex to ID Panel     Status: None (Preliminary result)   Collection Time: 10/18/18 10:50 AM  Result Value Ref Range Status   Specimen Description BLOOD LEFT ANTECUBITAL  Final   Special Requests   Final    BOTTLES DRAWN AEROBIC AND ANAEROBIC Blood Culture adequate volume   Culture   Final    NO GROWTH < 24 HOURS Performed at [facility], [place] [address], [place], [place] [address]    Report Status PENDING  Incomplete  Culture, blood (Routine X 2) w Reflex to ID Panel     Status: None (Preliminary result)   Collection Time: 10/18/18 11:00 AM  Result Value Ref Range Status   Specimen Description BLOOD LEFT FOREARM  Final   Special Requests   Final    BOTTLES DRAWN AEROBIC AND ANAEROBIC Blood Culture adequate volume   Culture   Final    NO GROWTH < 24 HOURS Performed at [facility], [place] [address] [place], [place] [address]    Report Status PENDING  Incomplete         Radiology Studies: Dg Abd Portable 1v  Result Date: 10/18/2018 CLINICAL DATA:  Acute onset of generalized abdominal pain. Constipation. EXAM: PORTABLE ABDOMEN - 1 VIEW COMPARISON:  CT of the abdomen and pelvis performed 10/15/2018 FINDINGS: The visualized bowel gas pattern is unremarkable. Scattered air and stool filled loops of colon are seen; no abnormal dilatation of small bowel loops is seen to suggest small bowel obstruction. No free intra-abdominal air is identified, though evaluation for free air is limited on a single supine view. The visualized osseous structures are within normal limits; the sacroiliac joints are unremarkable in  appearance. IMPRESSION: Unremarkable bowel gas pattern; no free intra-abdominal air seen. Small amount of stool noted in the colon. Electronically Signed   By: Garald Balding M.D.   On: 10/18/2018 21:28  Scheduled Meds: . aspirin EC  81 mg Oral Daily  . atorvastatin  10 mg Oral q1800  . Chlorhexidine Gluconate Cloth  6 each Topical Q0600  . doxercalciferol  3 mcg Intravenous Q T,Th,Sa-HD  . heparin  2,000 Units Dialysis Once in dialysis  . heparin injection (subcutaneous)  5,000 Units Subcutaneous Q8H  . insulin aspart  0-9 Units Subcutaneous TID WC  . midodrine  10 mg Oral Q T,Th,Sa-HD  . mupirocin ointment  1 application Nasal BID  . pantoprazole  40 mg Oral BID AC  . rOPINIRole  0.25-0.5 mg Oral QHS  . sodium chloride flush  3 mL Intravenous Q12H   Continuous Infusions: . sodium chloride 10 mL/hr at 10/17/18 1719     LOS: 3 days     Cordelia Poche, MD [facility] 10/19/2018, 9:39 AM  If 7PM-7AM, please contact night-coverage [email or web address]

## 2018-10-19 NOTE — Progress Notes (Signed)
Per pt, he ate sandwich in dialysis. Ultrasound tech stated that abd ultrasound would have to be postponed until 4 pm. Pt aware to not eat or drink anything until then.

## 2018-10-19 NOTE — Progress Notes (Signed)
[facility] Progress Note  Subjective: c/o abd pain  Vitals:   10/18/18 1131 10/18/18 1952 10/19/18 0357 10/19/18 0734  BP: (!) 115/53 128/60 117/61 (!) 100/48  Pulse: 95 94 (!) 115 98  Resp: 18 15 16  (!) 22  Temp: 98.8 F (37.1 C) 98.2 F (36.8 C) 98.9 F (37.2 C) 99.5 F (37.5 C)  TempSrc: Oral Oral Axillary Oral  SpO2: 97% 100% 99% 98%  Weight:   75.2 kg 75.3 kg  Height:        Inpatient medications: . aspirin EC  81 mg Oral Daily  . atorvastatin  10 mg Oral q1800  . Chlorhexidine Gluconate Cloth  6 each Topical Q0600  . doxercalciferol  3 mcg Intravenous Q T,Th,Sa-HD  . ferric citrate  210 mg Oral TID WC  . heparin injection (subcutaneous)  5,000 Units Subcutaneous Q8H  . insulin aspart  0-9 Units Subcutaneous TID WC  . midodrine  10 mg Oral Q T,Th,Sa-HD  . mupirocin ointment  1 application Nasal BID  . pantoprazole  40 mg Oral BID AC  . rOPINIRole  0.25-0.5 mg Oral QHS  . sodium chloride flush  3 mL Intravenous Q12H   . sodium chloride 10 mL/hr at 10/17/18 1719   sodium chloride, acetaminophen, HYDROmorphone (DILAUDID) injection, loperamide, ondansetron (ZOFRAN) IV, oxyCODONE, sodium chloride flush  Iron/TIBC/Ferritin/ %Sat    Component Value Date/Time   IRON 39 (L) 12/07/2017 0349   TIBC 178 (L) 12/07/2017 0349   FERRITIN 189 12/07/2017 0349   IRONPCTSAT 22 12/07/2017 0349    Exam: General: alert  Hispanic Male NAD, writhing from pain HEENT: [place] , EOMI, Not icteric, MMM Neck: no jvd  Heart: RRR.  No m,r,g Lungs: CTA , no r,c,w Abdomen: BS +, mild tenderness, soft, no ascites , no hsm,  no rebound Extremities: No pedal edema  Skin: no overt rash,  Neuro: OX3, moves all extrem  Dialysis Access: Pos . Bruit RFA AVF  Dialysis: [facility] TTS 4h  2/2.25  74kg   P2   RFA AVF  Hep 2000 Hectorol 3 mcg IV/HD  Last Mircera  75 mcg 10/12/18 with last hgb11.6  10/12/18 and no ESA since 09/07/18      Assessment/Plan 1. ESRD -  HD TTS. HD  today.  2. Abd pain - patient writhing in pain on HD, gave him dilaudid. Have d/w primary team, they are aware.  Had neg abd CT this admit, RUQ [place] ordered.  3. Chest pain - sp LHC w/o sig CAD 4. Hypertension/volume - patient on midodrine pre HD , all his HTN meds were stopped at OP HD recently due to Low bp's. BP's are normal here.  5. Anemia  - hgb 11.0  No esa as op  6. Metabolic bone disease -  Auryxia as  Phos bind and on hectorol  7. DM type 2 per admit  8. Nutrition - Renal carb mod , rena vit     [name] MD [facility] pager [phone]   10/19/2018, 11:25 AM     Recent Labs  Lab 10/15/18 0046 10/16/18 0308 10/17/18 0311 10/19/18 0828  NA  --  133* 130* 132*  K  --  5.2* 5.3* 4.9  CL  --  93* 89* 94*  CO2  --  25 19* 20*  GLUCOSE  --  114* 96 107*  BUN  --  55* 74* 55*  CREATININE  --  9.95* 12.34* 10.27*  CALCIUM  --  9.0 9.3 8.7*  PHOS 4.6  --   --  6.4*  ALBUMIN  --  3.0*  --  2.7*   Recent Labs  Lab 10/14/18 1952 10/16/18 0308  AST 20 14*  ALT 27 19  ALKPHOS 57 60  BILITOT 0.8 0.6  PROT 7.8 6.5   Recent Labs  Lab 10/14/18 1746  10/14/18 1920  10/18/18 0259 10/19/18 0346  WBC 4.9  --  15.5*   < > 11.6* 15.6*  NEUTROABS 4.3  --  13.7*  --   --   --   HGB 3.8*   < > 12.7*   < > 10.6* 10.4*  HCT 13.1*   < > 40.2   < > 33.0* 33.5*  MCV 104.0*  --  94.4   < > 92.4 93.1  PLT 86*  --  290   < > 309 351   < > = values in this interval not displayed.

## 2018-10-19 NOTE — Plan of Care (Signed)
  Problem: Activity: Goal: Risk for activity intolerance will decrease Outcome: Progressing   Problem: Coping: Goal: Level of anxiety will decrease Outcome: Progressing   

## 2018-10-20 ENCOUNTER — Inpatient Hospital Stay (HOSPITAL_COMMUNITY): Payer: Self-pay

## 2018-10-20 LAB — CBC
HEMATOCRIT: 32.7 % — AB (ref 39.0–52.0)
Hemoglobin: 10.3 g/dL — ABNORMAL LOW (ref 13.0–17.0)
MCH: 29.4 pg (ref 26.0–34.0)
MCHC: 31.5 g/dL (ref 30.0–36.0)
MCV: 93.4 fL (ref 80.0–100.0)
Platelets: 348 10*3/uL (ref 150–400)
RBC: 3.5 MIL/uL — ABNORMAL LOW (ref 4.22–5.81)
RDW: 15.5 % (ref 11.5–15.5)
WBC: 11.7 10*3/uL — ABNORMAL HIGH (ref 4.0–10.5)
nRBC: 0 % (ref 0.0–0.2)

## 2018-10-20 LAB — GLUCOSE, CAPILLARY
GLUCOSE-CAPILLARY: 179 mg/dL — AB (ref 70–99)
GLUCOSE-CAPILLARY: 63 mg/dL — AB (ref 70–99)
Glucose-Capillary: 117 mg/dL — ABNORMAL HIGH (ref 70–99)
Glucose-Capillary: 176 mg/dL — ABNORMAL HIGH (ref 70–99)
Glucose-Capillary: 93 mg/dL (ref 70–99)

## 2018-10-20 LAB — OCCULT BLOOD X 1 CARD TO LAB, STOOL: FECAL OCCULT BLD: NEGATIVE

## 2018-10-20 MED ORDER — CHLORHEXIDINE GLUCONATE CLOTH 2 % EX PADS
6.0000 | MEDICATED_PAD | Freq: Every day | CUTANEOUS | Status: DC
  Administered 2018-10-25: 6 via TOPICAL

## 2018-10-20 MED ORDER — PROMETHAZINE HCL 25 MG/ML IJ SOLN
12.5000 mg | Freq: Once | INTRAMUSCULAR | Status: AC
  Administered 2018-10-20: 12.5 mg via INTRAVENOUS
  Filled 2018-10-20: qty 1

## 2018-10-20 NOTE — Progress Notes (Signed)
PROGRESS NOTE    Fernando Jones  PRF:[id number] DOB: 04-13-1961 DOA: 10/14/2018 PCP: Patient, No Pcp Per   Brief Narrative: Fernando Jones is a 57 y.o. malewitha hxof ESRD on HD (T/Th/Sat),DM, HTN, GERD, tobacco abuse,andHLD. He presented with left sided chest pain concerning for ACS. ACS ruled out but now with RUQ pain.   Assessment & Plan:   Active Problems:   DM (diabetes mellitus), type 2 with renal complications (HCC)   TOBACCO ABUSE   Essential hypertension   ESRD (end stage renal disease) (HCC)   Chest pain   Chest pain Unknown etiology. Atypical. Patient underwent left heart cath on 11/12 significant for mild-moderate disease. Chest pain resolved. -Cardiology recommendations: medical management  CAD Underwent left heart cath significant for mild-moderate LAD/diagonal disease -Cardiology recommendations: Medical management/outpatient follow-up  RUQ abdominal pain Appears to be new per patient. Associated diarrhea. No recent antibiotics. Last regular bowel movement was four days ago. Recent CT abdomen significant for stool with normal appearing gallbladder and liver. No recurrent bowel movement. Pain still present. Patient produces a little bit of urine. No history of kidney stones. Ultrasound significant for some bowel gas. -CT renal study  Diarrhea No recent antibiotics. Afebrile, elevated WBC, which has trended down slightly. C. Difficile test previously placed. No recurrent diarrhea.  Diabetes mellitus, type 2 Patient with a remote history. Not on medication as an outpatient. Current hemoglobin A1C of 7.0%.  -Continue SSI  ESRD on HD HD every TTS -Per nephrology  Essential hypertension Blood pressure on the lower side. On carvedilol and hydralazine as an outpatient. -Continue to hold antihypertensives.  Lung nodule Incidental finding. Recommendations for repeat non-contrast CT as an outpatient in 6-12 months  Pressure Injury  Documentation:     DVT prophylaxis: Heparin subq Code Status:   Code Status: Full Code Family Communication: None at bedside Disposition Plan: Discharge pending workup/improvement of abdominal pain   Consultants:   Nephrology  Cardiology  Procedures:   Transthoracic Echocardiogram (11/10)  Study Conclusions  - Left ventricle: The cavity size was normal. Wall thickness was   increased in a pattern of mild LVH. Systolic function was normal.   The estimated ejection fraction was in the range of 60% to 65%.   Wall motion was normal; there were no regional wall motion   abnormalities. Doppler parameters are consistent with abnormal   left ventricular relaxation (grade 1 diastolic dysfunction). - Aortic valve: Mildly calcified annulus. Trileaflet; mildly   calcified leaflets. - Mitral valve: Mildly calcified annulus. There was trivial   regurgitation. - Right atrium: Central venous pressure (est): 3 mm Hg. - Tricuspid valve: There was trivial regurgitation. - Pulmonary arteries: Systolic pressure could not be accurately   estimated. - Pericardium, extracardiac: A small pericardial effusion was   identified circumferential to the heart with signs of   organization versus chronicity.   Left heart cath (93/57) LV end diastolic pressure is normal. There is no aortic valve stenosis. Ost 1st Diag lesion is 40% stenosed. Prox LAD lesion is 40% stenosed with 50% stenosed side branch in Ost 2nd Diag.   SUMMARY Mild to moderate LAD and diagonal disease, but no significant stenosis. Low to normal LVEDP.  Normal EF by echo.   RECOMMENDATIONS Return to nursing care for monitoring post cath.  Medical management for possible microvascular disease. Recommend Aspirin 81mg  daily for moderate CAD.   Antimicrobials:  None    Subjective: Pain is consistent. Required IV analgesics last night. Bowel movement yesterday that was soft.  Objective: Vitals:   10/19/18 2348 10/20/18  0511 10/20/18 0821 10/20/18 0933  BP: (!) 97/44 (!) 107/52 (!) 90/53 (!) 94/49  Pulse:  (!) 102 90 (!) 104  Resp:  16 18 18   Temp: 98.9 F (37.2 C) 98.9 F (37.2 C) 99.5 F (37.5 C)   TempSrc: Oral Oral Oral   SpO2:  91% 98%   Weight:  74.1 kg    Height:        Intake/Output Summary (Last 24 hours) at 10/20/2018 1031 Last data filed at 10/20/2018 0457 Gross per 24 hour  Intake 240 ml  Output -  Net 240 ml   Filed Weights   10/19/18 0734 10/19/18 1214 10/20/18 0511  Weight: 75.3 kg 74.2 kg 74.1 kg    Examination:  General exam: Appears calm and comfortable Respiratory system: Clear to auscultation. Respiratory effort normal. Cardiovascular system: S1 & S2 heard, RRR. No murmurs, rubs, gallops or clicks. Gastrointestinal system: Abdomen is nondistended, soft and mildly tender in RUQ. Normal bowel sounds heard. Central nervous system: Alert and oriented. No focal neurological deficits. Extremities: No edema. No calf tenderness Skin: No cyanosis. No rashes Psychiatry: Judgement and insight appear normal. Anxious.    Data Reviewed: I have personally reviewed following labs and imaging studies  CBC: Recent Labs  Lab 10/14/18 1746  10/14/18 1920  10/16/18 0308 10/17/18 0311 10/18/18 0259 10/19/18 0346 10/20/18 0332  WBC 4.9  --  15.5*  --  13.8* 14.0* 11.6* 15.6* 11.7*  NEUTROABS 4.3  --  13.7*  --   --   --   --   --   --   HGB 3.8*   < > 12.7*   < > 11.0* 12.1* 10.6* 10.4* 10.3*  HCT 13.1*   < > 40.2   < > 35.5* 39.1 33.0* 33.5* 32.7*  MCV 104.0*  --  94.4  --  94.4 93.8 92.4 93.1 93.4  PLT 86*  --  290  --  311 367 309 351 348   < > = values in this interval not displayed.   Basic Metabolic Panel: Recent Labs  Lab 10/14/18 1920 10/14/18 1946 10/14/18 1952 10/15/18 0046 10/16/18 0308 10/17/18 0311 10/19/18 0828  NA 136 137 138  --  133* 130* 132*  K 3.7 3.8 4.0  --  5.2* 5.3* 4.9  CL 94* 95* 94*  --  93* 89* 94*  CO2 28  --  30  --  25 19* 20*    GLUCOSE 138* 133* 120*  --  114* 96 107*  BUN 25* 27* 25*  --  55* 74* 55*  CREATININE 6.13* 6.20* 6.20*  --  9.95* 12.34* 10.27*  CALCIUM 9.2  --  9.4  --  9.0 9.3 8.7*  MG  --   --   --  2.2  --   --   --   PHOS  --   --   --  4.6  --   --  6.4*   GFR: Estimated Creatinine Clearance: 7.1 mL/min (A) (by C-G formula based on SCr of 10.27 mg/dL (H)). Liver Function Tests: Recent Labs  Lab 10/14/18 1952 10/16/18 0308 10/19/18 0828  AST 20 14*  --   ALT 27 19  --   ALKPHOS 57 60  --   BILITOT 0.8 0.6  --   PROT 7.8 6.5  --   ALBUMIN 3.7 3.0* 2.7*   Recent Labs  Lab 10/14/18 1952  LIPASE 29   No results for  input(s): AMMONIA in the last 168 hours. Coagulation Profile: No results for input(s): INR, PROTIME in the last 168 hours. Cardiac Enzymes: Recent Labs  Lab 10/14/18 1952 10/14/18 2229 10/15/18 0431 10/15/18 0917  TROPONINI <0.03 0.15* 0.26* 0.20*   BNP (last 3 results) No results for input(s): PROBNP in the last 8760 hours. HbA1C: No results for input(s): HGBA1C in the last 72 hours. CBG: Recent Labs  Lab 10/19/18 0941 10/19/18 1405 10/19/18 1611 10/19/18 2144 10/20/18 0606  GLUCAP 103* 134* 125* 104* 117*   Lipid Profile: No results for input(s): CHOL, HDL, LDLCALC, TRIG, CHOLHDL, LDLDIRECT in the last 72 hours. Thyroid Function Tests: No results for input(s): TSH, T4TOTAL, FREET4, T3FREE, THYROIDAB in the last 72 hours. Anemia Panel: No results for input(s): VITAMINB12, FOLATE, FERRITIN, TIBC, IRON, RETICCTPCT in the last 72 hours. Sepsis Labs: No results for input(s): PROCALCITON, LATICACIDVEN in the last 168 hours.  Recent Results (from the past 240 hour(s))  MRSA PCR Screening     Status: Abnormal   Collection Time: 10/15/18 12:54 AM  Result Value Ref Range Status   MRSA by PCR POSITIVE (A) NEGATIVE Final    Comment: RESULT CALLED TO, READ BACK BY AND VERIFIED WITH: RN TIM IRBY [id number] @0520  THANEY        The GeneXpert MRSA Assay  (FDA approved for NASAL specimens only), is one component of a comprehensive MRSA colonization surveillance program. It is not intended to diagnose MRSA infection nor to guide or monitor treatment for MRSA infections. Performed at [facility], [place] [address] [place], [place] [address]   Culture, blood (Routine X 2) w Reflex to ID Panel     Status: None (Preliminary result)   Collection Time: 10/18/18 10:50 AM  Result Value Ref Range Status   Specimen Description BLOOD LEFT ANTECUBITAL  Final   Special Requests   Final    BOTTLES DRAWN AEROBIC AND ANAEROBIC Blood Culture adequate volume   Culture   Final    NO GROWTH 2 DAYS Performed at [facility], [place] [address], [place], [place] [address]    Report Status PENDING  Incomplete  Culture, blood (Routine X 2) w Reflex to ID Panel     Status: None (Preliminary result)   Collection Time: 10/18/18 11:00 AM  Result Value Ref Range Status   Specimen Description BLOOD LEFT FOREARM  Final   Special Requests   Final    BOTTLES DRAWN AEROBIC AND ANAEROBIC Blood Culture adequate volume   Culture   Final    NO GROWTH 2 DAYS Performed at [facility], [place] [address] [place], [place] [address]    Report Status PENDING  Incomplete         Radiology Studies: Dg Abd Portable 1v  Result Date: 10/18/2018 CLINICAL DATA:  Acute onset of generalized abdominal pain. Constipation. EXAM: PORTABLE ABDOMEN - 1 VIEW COMPARISON:  CT of the abdomen and pelvis performed 10/15/2018 FINDINGS: The visualized bowel gas pattern is unremarkable. Scattered air and stool filled loops of colon are seen; no abnormal dilatation of small bowel loops is seen to suggest small bowel obstruction. No free intra-abdominal air is identified, though evaluation for free air is limited on a single supine view. The visualized osseous structures are within normal limits; the sacroiliac joints are unremarkable in appearance. IMPRESSION: Unremarkable bowel gas  pattern; no free intra-abdominal air seen. Small amount of stool noted in the colon. Electronically Signed   By: Francoise Schaumann.D.  On: 10/18/2018 21:28   US Abdomen Limited Ruq  Result Date: 10/19/2018 CLINICAL DATA:  Right upper quadrant pain EXAM: ULTRASOUND ABDOMEN LIMITED RIGHT UPPER QUADRANT COMPARISON:  None. FINDINGS: Gallbladder: Gallbladder is moderately distended without mural thickening or calculus. no sonographic Murphy sign noted by sonographer. Common bile duct: Diameter: 3 mm Liver: No focal lesion identified. The left hepatic lobe is partially obscured by bowel gas. Within normal limits in parenchymal echogenicity. Portal vein is patent on color Doppler imaging with normal direction of blood flow towards the liver. Other: Slight renal cortical thinning. IMPRESSION: No sonographic findings for the patient's right upper quadrant pain. Electronically Signed   By: Ashley Royalty M.D.   On: 10/19/2018 19:25        Scheduled Meds: . aspirin EC  81 mg Oral Daily  . atorvastatin  10 mg Oral q1800  . doxercalciferol  3 mcg Intravenous Q T,Th,Sa-HD  . ferric citrate  210 mg Oral TID WC  . heparin injection (subcutaneous)  5,000 Units Subcutaneous Q8H  . insulin aspart  0-9 Units Subcutaneous TID WC  . midodrine  10 mg Oral Q T,Th,Sa-HD  . pantoprazole  40 mg Oral BID AC  . rOPINIRole  0.25-0.5 mg Oral QHS  . sodium chloride flush  3 mL Intravenous Q12H   Continuous Infusions: . sodium chloride 10 mL/hr at 10/17/18 1719     LOS: 4 days     Cordelia Poche, MD [facility] 10/20/2018, 10:31 AM  If 7PM-7AM, please contact night-coverage [email or web address]

## 2018-10-20 NOTE — Plan of Care (Signed)
  Problem: Activity: Goal: Risk for activity intolerance will decrease Outcome: Progressing   

## 2018-10-20 NOTE — Progress Notes (Signed)
Pt walked back from bathroom to bed. Pt reported feeling weak and was unsteady walking to the bed. Pt vomited large amount in trash can at bedside. IV zofran administered. Will continue to monitor.   Ara Kussmaul BSN, RN

## 2018-10-20 NOTE — Progress Notes (Signed)
[name] Progress Note  Subjective: c/o abd pain and chest pain, worse w/ deep breath, Pain "all over", very poor historian  Vitals:   10/20/18 0511 10/20/18 0821 10/20/18 0933 10/20/18 1122  BP: (!) 107/52 (!) 90/53 (!) 94/49 (!) 98/53  Pulse: (!) 102 90 (!) 104 99  Resp: 16 18 18 17   Temp: 98.9 F (37.2 C) 99.5 F (37.5 C)  99.5 F (37.5 C)  TempSrc: Oral Oral  Oral  SpO2: 91% 98%  99%  Weight: 74.1 kg     Height:        Inpatient medications: . aspirin EC  81 mg Oral Daily  . atorvastatin  10 mg Oral q1800  . doxercalciferol  3 mcg Intravenous Q T,Th,Sa-HD  . ferric citrate  210 mg Oral TID WC  . heparin injection (subcutaneous)  5,000 Units Subcutaneous Q8H  . insulin aspart  0-9 Units Subcutaneous TID WC  . midodrine  10 mg Oral Q T,Th,Sa-HD  . pantoprazole  40 mg Oral BID AC  . rOPINIRole  0.25-0.5 mg Oral QHS  . sodium chloride flush  3 mL Intravenous Q12H   . sodium chloride 10 mL/hr at 10/17/18 1719   sodium chloride, acetaminophen, HYDROmorphone, loperamide, ondansetron (ZOFRAN) IV, oxyCODONE, sodium chloride flush  Iron/TIBC/Ferritin/ %Sat    Component Value Date/Time   IRON 39 (L) 12/07/2017 0349   TIBC 178 (L) 12/07/2017 0349   FERRITIN 189 12/07/2017 0349   IRONPCTSAT 22 12/07/2017 0349    Exam: General: alert  Hispanic Male NAD, writhing from pain HEENT: [name] , EOMI, Not icteric, MMM Neck: no jvd  Heart: RRR.  No m,r,g Lungs: CTA , no r,c,w Abdomen: BS +, mild tenderness, soft, no ascites , no hsm,  no rebound Extremities: No pedal edema , no back rash or blistering Skin: no overt rash,  Neuro: OX3, moves all extrem  Dialysis Access: Pos . Bruit RFA AVF  Dialysis: [facility] TTS 4h  2/2.25  74kg   P2   RFA AVF  Hep 2000 Hectorol 3 mcg IV/HD  Last Mircera  75 mcg 10/12/18 with last hgb11.6  10/12/18 and no ESA since 09/07/18      Assessment/Plan 1. ESRD -  HD TTS. HD tomorrow.  2. Abd pain/ chest CT - has already had sig  imaging w/ CTA chest/ aorta, abd CT, RUQ [place].  Has not had eval for PE though, will d/w primary, this is a long-shot but nothing else at this time to explain his persistent CP/ abd pain w/ ^WBC.   3. Chest pain - sp LHC w/o sig CAD 4. HypOtension/volume - patient on midodrine pre HD , all his HTN meds were stopped at OP HD recently due to Low bp's. BP's are normal here. At dry wt. BP's soft.  5. Anemia  - hgb 11.0  No esa as op  6. Metabolic bone disease -  Auryxia as  Phos bind and on hectorol  7. DM type 2 per admit  8. Nutrition - Renal carb mod , rena vit     [name] MD [facility] pager [phone]   10/20/2018, 1:29 PM     Recent Labs  Lab 10/15/18 0046 10/16/18 0308 10/17/18 0311 10/19/18 0828  NA  --  133* 130* 132*  K  --  5.2* 5.3* 4.9  CL  --  93* 89* 94*  CO2  --  25 19* 20*  GLUCOSE  --  114* 96 107*  BUN  --  55* 74* 55*  CREATININE  --  9.95* 12.34* 10.27*  CALCIUM  --  9.0 9.3 8.7*  PHOS 4.6  --   --  6.4*  ALBUMIN  --  3.0*  --  2.7*   Recent Labs  Lab 10/14/18 1952 10/16/18 0308  AST 20 14*  ALT 27 19  ALKPHOS 57 60  BILITOT 0.8 0.6  PROT 7.8 6.5   Recent Labs  Lab 10/14/18 1746  10/14/18 1920  10/19/18 0346 10/20/18 0332  WBC 4.9  --  15.5*   < > 15.6* 11.7*  NEUTROABS 4.3  --  13.7*  --   --   --   HGB 3.8*   < > 12.7*   < > 10.4* 10.3*  HCT 13.1*   < > 40.2   < > 33.5* 32.7*  MCV 104.0*  --  94.4   < > 93.1 93.4  PLT 86*  --  290   < > 351 348   < > = values in this interval not displayed.

## 2018-10-21 ENCOUNTER — Inpatient Hospital Stay (HOSPITAL_COMMUNITY): Payer: Self-pay

## 2018-10-21 LAB — GLUCOSE, CAPILLARY
GLUCOSE-CAPILLARY: 97 mg/dL (ref 70–99)
GLUCOSE-CAPILLARY: 59 mg/dL — AB (ref 70–99)
GLUCOSE-CAPILLARY: 87 mg/dL (ref 70–99)
Glucose-Capillary: 79 mg/dL (ref 70–99)
Glucose-Capillary: 115 mg/dL — ABNORMAL HIGH (ref 70–99)

## 2018-10-21 LAB — BASIC METABOLIC PANEL
Anion gap: 17 — ABNORMAL HIGH (ref 5–15)
BUN: 48 mg/dL — AB (ref 6–20)
CO2: 23 mmol/L (ref 22–32)
CREATININE: 9.12 mg/dL — AB (ref 0.61–1.24)
Calcium: 9.1 mg/dL (ref 8.9–10.3)
Chloride: 95 mmol/L — ABNORMAL LOW (ref 98–111)
GFR calc Af Amer: 6 mL/min — ABNORMAL LOW (ref >60)
GFR, EST NON AFRICAN AMERICAN: 6 mL/min — AB (ref >60)
Glucose, Bld: 98 mg/dL (ref 70–99)
POTASSIUM: 4.2 mmol/L (ref 3.5–5.1)
SODIUM: 135 mmol/L (ref 135–145)

## 2018-10-21 LAB — CBC
HCT: 36.5 % — ABNORMAL LOW (ref 39.0–52.0)
Hemoglobin: 11.1 g/dL — ABNORMAL LOW (ref 13.0–17.0)
MCH: 28.4 pg (ref 26.0–34.0)
MCHC: 30.4 g/dL (ref 30.0–36.0)
MCV: 93.4 fL (ref 80.0–100.0)
Platelets: 414 10*3/uL — ABNORMAL HIGH (ref 150–400)
RBC: 3.91 MIL/uL — AB (ref 4.22–5.81)
RDW: 15.5 % (ref 11.5–15.5)
WBC: 11.6 10*3/uL — ABNORMAL HIGH (ref 4.0–10.5)
nRBC: 0 % (ref 0.0–0.2)

## 2018-10-21 MED ORDER — MIDODRINE HCL 5 MG PO TABS
ORAL_TABLET | ORAL | Status: AC
  Filled 2018-10-21: qty 2

## 2018-10-21 MED ORDER — DOXERCALCIFEROL 4 MCG/2ML IV SOLN
INTRAVENOUS | Status: AC
  Filled 2018-10-21: qty 2

## 2018-10-21 MED ORDER — ALUM & MAG HYDROXIDE-SIMETH 200-200-20 MG/5ML PO SUSP
30.0000 mL | ORAL | Status: DC | PRN
  Administered 2018-10-21 – 2018-10-22 (×4): 30 mL via ORAL
  Filled 2018-10-21 (×4): qty 30

## 2018-10-21 MED ORDER — DICLOFENAC SODIUM 1 % TD GEL
2.0000 g | Freq: Four times a day (QID) | TRANSDERMAL | Status: DC
  Administered 2018-10-21 – 2018-10-25 (×15): 2 g via TOPICAL
  Filled 2018-10-21: qty 100

## 2018-10-21 NOTE — Progress Notes (Signed)
[facility] Progress Note  Subjective: c/o vague abd pain and upper chest pains again today  Vitals:   10/21/18 1030 10/21/18 1100 10/21/18 1130 10/21/18 1200  BP: (!) 96/47 112/60 (!) 106/58 110/75  Pulse: 92 73 74 95  Resp: 17 16 18 19   Temp:    98.9 F (37.2 C)  TempSrc:    Oral  SpO2:    98%  Weight:    72.9 kg  Height:        Inpatient medications: . aspirin EC  81 mg Oral Daily  . atorvastatin  10 mg Oral q1800  . Chlorhexidine Gluconate Cloth  6 each Topical Q0600  . doxercalciferol  3 mcg Intravenous Q T,Th,Sa-HD  . ferric citrate  210 mg Oral TID WC  . heparin injection (subcutaneous)  5,000 Units Subcutaneous Q8H  . insulin aspart  0-9 Units Subcutaneous TID WC  . midodrine  10 mg Oral Q T,Th,Sa-HD  . pantoprazole  40 mg Oral BID AC  . rOPINIRole  0.25-0.5 mg Oral QHS  . sodium chloride flush  3 mL Intravenous Q12H   . sodium chloride 10 mL/hr at 10/17/18 1719   sodium chloride, acetaminophen, HYDROmorphone, loperamide, ondansetron (ZOFRAN) IV, oxyCODONE, sodium chloride flush  Iron/TIBC/Ferritin/ %Sat    Component Value Date/Time   IRON 39 (L) 12/07/2017 0349   TIBC 178 (L) 12/07/2017 0349   FERRITIN 189 12/07/2017 0349   IRONPCTSAT 22 12/07/2017 0349    Exam: General: alert  Hispanic Male , looks fine, not in distress, looks better today HEENT: [place] , EOMI, Not icteric, MMM Neck: no jvd  Heart: RRR.  No m,r,g Lungs: CTA , no r,c,w Abdomen: BS +, mild tenderness, soft, no ascites , no hsm,  no rebound Extremities: No pedal edema , no back rash or blistering Skin: no overt rash,  Neuro: OX3, moves all extrem  Dialysis Access: Pos . Bruit RFA AVF  Dialysis: [facility] TTS 4h  2/2.25  74kg   P2   RFA AVF  Hep 2000 Hectorol 3 mcg IV/HD  Last Mircera  75 mcg 10/12/18 with last hgb11.6  10/12/18 and no ESA since 09/07/18      Assessment/Plan 1. ESRD -  HD TTS. HD today.  2. Abd pain - w/u per primary w/o sig findings thus  far 3. Chest pain - sp LHC w/o sig CAD 4. Hypotension - chronic issue, patient on midodrine pre HD , all his HTN meds were stopped at OP HD recently due to Low bp's. BP's are normal here. At dry wt.  5. Volume - 1 kg under, stable on exam 6. Anemia  - hgb 11.0  No esa as op  7. Metabolic bone disease -  Auryxia as  Phos bind and on hectorol  8. DM type 2 per admit  9. Nutrition - Renal carb mod , rena vit     [name] MD [facility] pager [phone]   10/21/2018, 12:47 PM     Recent Labs  Lab 10/15/18 0046 10/16/18 0308  10/19/18 0828 10/21/18 0329  NA  --  133*   < > 132* 135  K  --  5.2*   < > 4.9 4.2  CL  --  93*   < > 94* 95*  CO2  --  25   < > 20* 23  GLUCOSE  --  114*   < > 107* 98  BUN  --  55*   < > 55* 48*  CREATININE  --  9.95*   < > 10.27* 9.12*  CALCIUM  --  9.0   < > 8.7* 9.1  PHOS 4.6  --   --  6.4*  --   ALBUMIN  --  3.0*  --  2.7*  --    < > = values in this interval not displayed.   Recent Labs  Lab 10/14/18 1952 10/16/18 0308  AST 20 14*  ALT 27 19  ALKPHOS 57 60  BILITOT 0.8 0.6  PROT 7.8 6.5   Recent Labs  Lab 10/14/18 1746  10/14/18 1920  10/20/18 0332 10/21/18 0329  WBC 4.9  --  15.5*   < > 11.7* 11.6*  NEUTROABS 4.3  --  13.7*  --   --   --   HGB 3.8*   < > 12.7*   < > 10.3* 11.1*  HCT 13.1*   < > 40.2   < > 32.7* 36.5*  MCV 104.0*  --  94.4   < > 93.4 93.4  PLT 86*  --  290   < > 348 414*   < > = values in this interval not displayed.

## 2018-10-21 NOTE — Progress Notes (Signed)
Hypoglycemic Event  CBG: 59  Treatment: 15 GM carbohydrate snack  Symptoms: None  Follow-up CBG: Time:2128 CBG Result:87  Possible Reasons for Event: Inadequate meal intake      [place]

## 2018-10-21 NOTE — Progress Notes (Signed)
Patient back from dialysis at this time. Telemetry applied and CCMD notified. V/s and assessment done.

## 2018-10-21 NOTE — Progress Notes (Signed)
PROGRESS NOTE    Fernando Jones  NID:[id number] DOB: 1960-12-29 DOA: 10/14/2018 PCP: Patient, No Pcp Per   Brief Narrative: Fernando Jones is a 57 y.o. malewitha hxof ESRD on HD (T/Th/Sat),DM, HTN, GERD, tobacco abuse,andHLD. He presented with left sided chest pain concerning for ACS. ACS ruled out but now with RUQ pain.   Assessment & Plan:   Active Problems:   DM (diabetes mellitus), type 2 with renal complications (HCC)   TOBACCO ABUSE   Essential hypertension   ESRD (end stage renal disease) (HCC)   Chest pain   Chest pain Unknown etiology. Atypical. Patient underwent left heart cath on 11/12 significant for mild-moderate disease. Chest pain resolved. -Cardiology recommendations: medical management  CAD Underwent left heart cath significant for mild-moderate LAD/diagonal disease -Cardiology recommendations: Medical management/outpatient follow-up  RUQ abdominal pain Appears to be new per patient. Associated diarrhea. No recent antibiotics. Last regular bowel movement was four days ago. Recent CT abdomen significant for stool with normal appearing gallbladder and liver. No recurrent bowel movement. Pain still present. Patient produces a little bit of urine. No history of kidney stones. Ultrasound significant for some bowel gas. Repeat CT unremarkable for etiology. No objective finding for this abdominal pain. Associated vomiting yesterday with mention of black stool (FOBT negative). Bowel sounds diminished today -Abdominal x-ray -Bowel regimen  Diarrhea No recent antibiotics. Afebrile, elevated WBC, which has trended down slightly. C. Difficile test previously placed. No recurrent diarrhea.  Diabetes mellitus, type 2 Patient with a remote history. Not on medication as an outpatient. Current hemoglobin A1C of 7.0%.  -Continue SSI  ESRD on HD HD every TTS -Per nephrology  Essential hypertension Blood pressure on the lower side. On carvedilol and  hydralazine as an outpatient. -Continue to hold antihypertensives.  Lung nodule Incidental finding. Recommendations for repeat non-contrast CT as an outpatient in 6-12 months  Pressure Injury Documentation:     DVT prophylaxis: Heparin subq Code Status:   Code Status: Full Code Family Communication: None at bedside Disposition Plan: Discharge pending workup/improvement of abdominal pain   Consultants:   Nephrology  Cardiology  Procedures:   Transthoracic Echocardiogram (11/10)  Study Conclusions  - Left ventricle: The cavity size was normal. Wall thickness was   increased in a pattern of mild LVH. Systolic function was normal.   The estimated ejection fraction was in the range of 60% to 65%.   Wall motion was normal; there were no regional wall motion   abnormalities. Doppler parameters are consistent with abnormal   left ventricular relaxation (grade 1 diastolic dysfunction). - Aortic valve: Mildly calcified annulus. Trileaflet; mildly   calcified leaflets. - Mitral valve: Mildly calcified annulus. There was trivial   regurgitation. - Right atrium: Central venous pressure (est): 3 mm Hg. - Tricuspid valve: There was trivial regurgitation. - Pulmonary arteries: Systolic pressure could not be accurately   estimated. - Pericardium, extracardiac: A small pericardial effusion was   identified circumferential to the heart with signs of   organization versus chronicity.   Left heart cath (14/43) LV end diastolic pressure is normal. There is no aortic valve stenosis. Ost 1st Diag lesion is 40% stenosed. Prox LAD lesion is 40% stenosed with 50% stenosed side branch in Ost 2nd Diag.   SUMMARY Mild to moderate LAD and diagonal disease, but no significant stenosis. Low to normal LVEDP.  Normal EF by echo.   RECOMMENDATIONS Return to nursing care for monitoring post cath.  Medical management for possible microvascular disease. Recommend Aspirin 81mg   daily for  moderate CAD.   Antimicrobials:  None    Subjective: Pain is consistent. Required IV analgesics last night. Bowel movement yesterday that was soft.  Objective: Vitals:   10/21/18 0900 10/21/18 0930 10/21/18 1000 10/21/18 1030  BP: (!) 96/49 (!) (P) 95/48 (!) (P) 99/46 (!) 96/47  Pulse: 84 (P) 83 (P) 91 92  Resp: 15 (P) 16 (P) 16 17  Temp:      TempSrc:      SpO2:      Weight:      Height:       No intake or output data in the 24 hours ending 10/21/18 1148 Filed Weights   10/20/18 0511 10/21/18 0533 10/21/18 0745  Weight: 74.1 kg 72.8 kg 72.9 kg    Examination:  General exam: Appears calm and comfortable Respiratory system: Clear to auscultation. Respiratory effort normal. Cardiovascular system: S1 & S2 heard, RRR. No murmurs, rubs, gallops or clicks. Gastrointestinal system: Abdomen is nondistended, soft and mildly tender in RUQ. Normal bowel sounds heard. Central nervous system: Alert and oriented. No focal neurological deficits. Extremities: No edema. No calf tenderness Skin: No cyanosis. No rashes Psychiatry: Judgement and insight appear normal. Anxious.    Data Reviewed: I have personally reviewed following labs and imaging studies  CBC: Recent Labs  Lab 10/14/18 1746  10/14/18 1920  10/17/18 0311 10/18/18 0259 10/19/18 0346 10/20/18 0332 10/21/18 0329  WBC 4.9  --  15.5*   < > 14.0* 11.6* 15.6* 11.7* 11.6*  NEUTROABS 4.3  --  13.7*  --   --   --   --   --   --   HGB 3.8*   < > 12.7*   < > 12.1* 10.6* 10.4* 10.3* 11.1*  HCT 13.1*   < > 40.2   < > 39.1 33.0* 33.5* 32.7* 36.5*  MCV 104.0*  --  94.4   < > 93.8 92.4 93.1 93.4 93.4  PLT 86*  --  290   < > 367 309 351 348 414*   < > = values in this interval not displayed.   Basic Metabolic Panel: Recent Labs  Lab 10/14/18 1952 10/15/18 0046 10/16/18 0308 10/17/18 0311 10/19/18 0828 10/21/18 0329  NA 138  --  133* 130* 132* 135  K 4.0  --  5.2* 5.3* 4.9 4.2  CL 94*  --  93* 89* 94* 95*  CO2 30   --  25 19* 20* 23  GLUCOSE 120*  --  114* 96 107* 98  BUN 25*  --  55* 74* 55* 48*  CREATININE 6.20*  --  9.95* 12.34* 10.27* 9.12*  CALCIUM 9.4  --  9.0 9.3 8.7* 9.1  MG  --  2.2  --   --   --   --   PHOS  --  4.6  --   --  6.4*  --    GFR: Estimated Creatinine Clearance: 8 mL/min (A) (by C-G formula based on SCr of 9.12 mg/dL (H)). Liver Function Tests: Recent Labs  Lab 10/14/18 1952 10/16/18 0308 10/19/18 0828  AST 20 14*  --   ALT 27 19  --   ALKPHOS 57 60  --   BILITOT 0.8 0.6  --   PROT 7.8 6.5  --   ALBUMIN 3.7 3.0* 2.7*   Recent Labs  Lab 10/14/18 1952  LIPASE 29   No results for input(s): AMMONIA in the last 168 hours. Coagulation Profile: No results for input(s): INR, PROTIME in  the last 168 hours. Cardiac Enzymes: Recent Labs  Lab 10/14/18 1952 10/14/18 2229 10/15/18 0431 10/15/18 0917  TROPONINI <0.03 0.15* 0.26* 0.20*   BNP (last 3 results) No results for input(s): PROBNP in the last 8760 hours. HbA1C: No results for input(s): HGBA1C in the last 72 hours. CBG: Recent Labs  Lab 10/20/18 1129 10/20/18 1657 10/20/18 2105 10/20/18 2142 10/21/18 0605  GLUCAP 176* 179* 63* 93 97   Lipid Profile: No results for input(s): CHOL, HDL, LDLCALC, TRIG, CHOLHDL, LDLDIRECT in the last 72 hours. Thyroid Function Tests: No results for input(s): TSH, T4TOTAL, FREET4, T3FREE, THYROIDAB in the last 72 hours. Anemia Panel: No results for input(s): VITAMINB12, FOLATE, FERRITIN, TIBC, IRON, RETICCTPCT in the last 72 hours. Sepsis Labs: No results for input(s): PROCALCITON, LATICACIDVEN in the last 168 hours.  Recent Results (from the past 240 hour(s))  MRSA PCR Screening     Status: Abnormal   Collection Time: 10/15/18 12:54 AM  Result Value Ref Range Status   MRSA by PCR POSITIVE (A) NEGATIVE Final    Comment: RESULT CALLED TO, READ BACK BY AND VERIFIED WITH: RN TIM IRBY [id number] @0520  THANEY        The GeneXpert MRSA Assay (FDA approved for NASAL  specimens only), is one component of a comprehensive MRSA colonization surveillance program. It is not intended to diagnose MRSA infection nor to guide or monitor treatment for MRSA infections. Performed at [facility], [place] [address], [place], [place] [address]   Culture, blood (Routine X 2) w Reflex to ID Panel     Status: None (Preliminary result)   Collection Time: 10/18/18 10:50 AM  Result Value Ref Range Status   Specimen Description BLOOD LEFT ANTECUBITAL  Final   Special Requests   Final    BOTTLES DRAWN AEROBIC AND ANAEROBIC Blood Culture adequate volume   Culture   Final    NO GROWTH 3 DAYS Performed at [facility], [place] [address], [place], [place] [address]    Report Status PENDING  Incomplete  Culture, blood (Routine X 2) w Reflex to ID Panel     Status: None (Preliminary result)   Collection Time: 10/18/18 11:00 AM  Result Value Ref Range Status   Specimen Description BLOOD LEFT FOREARM  Final   Special Requests   Final    BOTTLES DRAWN AEROBIC AND ANAEROBIC Blood Culture adequate volume   Culture   Final    NO GROWTH 3 DAYS Performed at [facility], [place] [address], [place], [place] [address]    Report Status PENDING  Incomplete         Radiology Studies: Ct Renal Stone Study  Result Date: 10/20/2018 CLINICAL DATA:  RIGHT flank pain, weakness, suspected stone disease, history hyperlipidemia, end-stage renal disease, nephrolithiasis, hypertension, type II diabetes mellitus EXAM: CT ABDOMEN AND PELVIS WITHOUT CONTRAST TECHNIQUE: Multidetector CT imaging of the abdomen and pelvis was performed following the standard protocol without IV contrast. Sagittal and coronal MPR images reconstructed from axial data set. No oral contrast administered for this indication. Motion artifacts at mid abdomen, for which repeat imaging was performed. COMPARISON:  10/15/2018 FINDINGS: Lower chest: Lung bases clear Hepatobiliary: Dependent density within  gallbladder question vicarious excretion of previously administered contrast from recent CT, not present on prior recent exam. Gallbladder and liver normal appearance Pancreas: Normal appearance Spleen: Normal appearance Adrenals/Urinary Tract: Adrenal glands normal appearance. Numerous calcifications in the kidneys, majority appear to be renovascular in origin though tiny calculi  are not excluded. No hydronephrosis or renal mass. No ureteral calcification or dilatation. Excreted contrast material within urinary bladder. Bladder wall appears thickened but bladder is significantly underdistended. Stomach/Bowel: Appendix surgically absent by history. Question medication tablets at gastric antrum. Stomach and bowel loops otherwise normal appearance. Vascular/Lymphatic: Extensive atherosclerotic calcifications aorta, iliac arteries, visceral arteries, coronary arteries. Aorta normal caliber. No adenopathy. Reproductive: Prostatic enlargement, gland 5.3 x 3.1 x 3.5 cm Other: No free air or free fluid. No acute inflammatory process. BILATERAL inguinal hernias containing fat. Musculoskeletal: Stranding at RIGHT inguinal region, per LEFT front medical record had recent heart catheterization on 10/17/2018 consistent with access site. No focal hematoma. Bones appear demineralized. Mild degenerative disc disease changes L5-S1. IMPRESSION: No acute intra-abdominal intrapelvic abnormalities. BILATERAL inguinal hernias containing fat. Extensive atherosclerotic disease including coronary arteries. Prostatic enlargement. Stranding at RIGHT inguinal region from recent heart catheterization. Electronically Signed   By: Lavonia Dana M.D.   On: 10/20/2018 11:18   US Abdomen Limited Ruq  Result Date: 10/19/2018 CLINICAL DATA:  Right upper quadrant pain EXAM: ULTRASOUND ABDOMEN LIMITED RIGHT UPPER QUADRANT COMPARISON:  None. FINDINGS: Gallbladder: Gallbladder is moderately distended without mural thickening or calculus. no  sonographic Murphy sign noted by sonographer. Common bile duct: Diameter: 3 mm Liver: No focal lesion identified. The left hepatic lobe is partially obscured by bowel gas. Within normal limits in parenchymal echogenicity. Portal vein is patent on color Doppler imaging with normal direction of blood flow towards the liver. Other: Slight renal cortical thinning. IMPRESSION: No sonographic findings for the patient's right upper quadrant pain. Electronically Signed   By: Ashley Royalty M.D.   On: 10/19/2018 19:25        Scheduled Meds: . aspirin EC  81 mg Oral Daily  . atorvastatin  10 mg Oral q1800  . Chlorhexidine Gluconate Cloth  6 each Topical Q0600  . doxercalciferol  3 mcg Intravenous Q T,Th,Sa-HD  . ferric citrate  210 mg Oral TID WC  . heparin injection (subcutaneous)  5,000 Units Subcutaneous Q8H  . insulin aspart  0-9 Units Subcutaneous TID WC  . midodrine  10 mg Oral Q T,Th,Sa-HD  . pantoprazole  40 mg Oral BID AC  . rOPINIRole  0.25-0.5 mg Oral QHS  . sodium chloride flush  3 mL Intravenous Q12H   Continuous Infusions: . sodium chloride 10 mL/hr at 10/17/18 1719     LOS: 5 days     Cordelia Poche, MD [facility] 10/21/2018, 11:48 AM  If 7PM-7AM, please contact night-coverage [email or web address]

## 2018-10-22 LAB — GLUCOSE, CAPILLARY
GLUCOSE-CAPILLARY: 97 mg/dL (ref 70–99)
GLUCOSE-CAPILLARY: 88 mg/dL (ref 70–99)
Glucose-Capillary: 112 mg/dL — ABNORMAL HIGH (ref 70–99)
Glucose-Capillary: 116 mg/dL — ABNORMAL HIGH (ref 70–99)

## 2018-10-22 LAB — SEDIMENTATION RATE: Sed Rate: 119 mm/hr — ABNORMAL HIGH (ref 0–16)

## 2018-10-22 LAB — CBC
HCT: 32.9 % — ABNORMAL LOW (ref 39.0–52.0)
Hemoglobin: 10 g/dL — ABNORMAL LOW (ref 13.0–17.0)
MCH: 28.8 pg (ref 26.0–34.0)
MCHC: 30.4 g/dL (ref 30.0–36.0)
MCV: 94.8 fL (ref 80.0–100.0)
NRBC: 0 % (ref 0.0–0.2)
PLATELETS: 488 10*3/uL — AB (ref 150–400)
RBC: 3.47 MIL/uL — AB (ref 4.22–5.81)
RDW: 15.6 % — ABNORMAL HIGH (ref 11.5–15.5)
WBC: 13 10*3/uL — AB (ref 4.0–10.5)

## 2018-10-22 LAB — CK: CK TOTAL: 42 U/L — AB (ref 49–397)

## 2018-10-22 LAB — C-REACTIVE PROTEIN: CRP: 26.8 mg/dL — AB (ref <1.0)

## 2018-10-22 MED ORDER — DICYCLOMINE HCL 20 MG PO TABS
20.0000 mg | ORAL_TABLET | Freq: Three times a day (TID) | ORAL | Status: DC
  Administered 2018-10-22 – 2018-10-25 (×11): 20 mg via ORAL
  Filled 2018-10-22 (×11): qty 1

## 2018-10-22 NOTE — Progress Notes (Signed)
PROGRESS NOTE    Chetan Mehring  YBW:[id number] DOB: 10-01-1961 DOA: 10/14/2018 PCP: Patient, No Pcp Per   Brief Narrative: Graysin Luczynski is a 57 y.o. malewitha hxof ESRD on HD (T/Th/Sat),DM, HTN, GERD, tobacco abuse,andHLD. He presented with left sided chest pain concerning for ACS. ACS ruled out but now with RUQ pain.   Assessment & Plan:   Active Problems:   DM (diabetes mellitus), type 2 with renal complications (HCC)   TOBACCO ABUSE   Essential hypertension   ESRD (end stage renal disease) (HCC)   Chest pain   Chest pain Unknown etiology. Atypical. Patient underwent left heart cath on 11/12 significant for mild-moderate disease. Chest pain resolved. -Cardiology recommendations: medical management  CAD Underwent left heart cath significant for mild-moderate LAD/diagonal disease -Cardiology recommendations: Medical management/outpatient follow-up  RUQ abdominal pain Appears to be new per patient. Associated diarrhea. No recent antibiotics. Last regular bowel movement was four days ago. Recent CT abdomen significant for stool with normal appearing gallbladder and liver. No recurrent bowel movement. Pain still present. Patient produces a little bit of urine. No history of kidney stones. Ultrasound significant for some bowel gas. Repeat CT unremarkable for etiology. No objective finding for this abdominal pain. Associated vomiting yesterday with mention of black stool (FOBT negative). Repeat abdominal x-ray normal. -Bowel regimen -Bentyl -Voltaren gel -ESR, CRP, CK  Diarrhea No recent antibiotics. Afebrile, elevated WBC, which has trended down slightly. C. Difficile test previously placed. No recurrent diarrhea.  Diabetes mellitus, type 2 Patient with a remote history. Not on medication as an outpatient. Current hemoglobin A1C of 7.0%. Patient with hypoglycemia overnight in setting of poor oral intake -Continue SSI -Start outpatient regimen on  discharge  ESRD on HD HD every TTS -Per nephrology  Essential hypertension Blood pressure on the lower side. On carvedilol and hydralazine as an outpatient. -Continue to hold antihypertensives.  Lung nodule Incidental finding. Recommendations for repeat non-contrast CT as an outpatient in 6-12 months  Pressure Injury Documentation:     DVT prophylaxis: Heparin subq Code Status:   Code Status: Full Code Family Communication: None at bedside Disposition Plan: Discharge likely in 24 hours if able to tolerate PO and no hypoglycemia   Consultants:   Nephrology  Cardiology  Procedures:   Transthoracic Echocardiogram (11/10)  Study Conclusions  - Left ventricle: The cavity size was normal. Wall thickness was   increased in a pattern of mild LVH. Systolic function was normal.   The estimated ejection fraction was in the range of 60% to 65%.   Wall motion was normal; there were no regional wall motion   abnormalities. Doppler parameters are consistent with abnormal   left ventricular relaxation (grade 1 diastolic dysfunction). - Aortic valve: Mildly calcified annulus. Trileaflet; mildly   calcified leaflets. - Mitral valve: Mildly calcified annulus. There was trivial   regurgitation. - Right atrium: Central venous pressure (est): 3 mm Hg. - Tricuspid valve: There was trivial regurgitation. - Pulmonary arteries: Systolic pressure could not be accurately   estimated. - Pericardium, extracardiac: A small pericardial effusion was   identified circumferential to the heart with signs of   organization versus chronicity.   Left heart cath (76/81) LV end diastolic pressure is normal. There is no aortic valve stenosis. Ost 1st Diag lesion is 40% stenosed. Prox LAD lesion is 40% stenosed with 50% stenosed side branch in Ost 2nd Diag.   SUMMARY Mild to moderate LAD and diagonal disease, but no significant stenosis. Low to normal LVEDP.  Normal EF by  echo.   RECOMMENDATIONS Return to nursing care for monitoring post cath.  Medical management for possible microvascular disease. Recommend Aspirin '81mg'$  daily for moderate CAD.   Antimicrobials:  None    Subjective: Abdominal pain improved with voltaren gel. Diarrhea improved with iron tablets.  Objective: Vitals:   10/21/18 1700 10/21/18 2024 10/22/18 0509 10/22/18 0514  BP: (!) 89/40 (!) 113/46 (!) 108/52   Pulse: 96 88 78   Resp:  (!) 24 15   Temp:  98.8 F (37.1 C) 99.3 F (37.4 C)   TempSrc:  Oral Oral   SpO2: 92% 98% 98%   Weight:    72.8 kg  Height:        Intake/Output Summary (Last 24 hours) at 10/22/2018 0753 Last data filed at 10/21/2018 2026 Gross per 24 hour  Intake 240 ml  Output 0 ml  Net 240 ml   Filed Weights   10/21/18 0745 10/21/18 1200 10/22/18 0514  Weight: 72.9 kg 72.9 kg 72.8 kg    Examination:  General exam: Appears calm and comfortable Respiratory system: Clear to auscultation. Respiratory effort normal. Cardiovascular system: S1 & S2 heard, RRR. No murmurs, rubs, gallops or clicks. Gastrointestinal system: Abdomen is nondistended, soft and tender with some guarding. No organomegaly or masses felt. Normal bowel sounds heard. Central nervous system: Alert and oriented. No focal neurological deficits. Extremities: No edema. No calf tenderness Skin: No cyanosis. No rashes Psychiatry: Judgement and insight appear normal. Mood & affect appropriate.     Data Reviewed: I have personally reviewed following labs and imaging studies  CBC: Recent Labs  Lab 10/18/18 0259 10/19/18 0346 10/20/18 0332 10/21/18 0329 10/22/18 0314  WBC 11.6* 15.6* 11.7* 11.6* 13.0*  HGB 10.6* 10.4* 10.3* 11.1* 10.0*  HCT 33.0* 33.5* 32.7* 36.5* 32.9*  MCV 92.4 93.1 93.4 93.4 94.8  PLT 309 351 348 414* 322*   Basic Metabolic Panel: Recent Labs  Lab 10/16/18 0308 10/17/18 0311 10/19/18 0828 10/21/18 0329  NA 133* 130* 132* 135  K 5.2* 5.3* 4.9 4.2   CL 93* 89* 94* 95*  CO2 25 19* 20* 23  GLUCOSE 114* 96 107* 98  BUN 55* 74* 55* 48*  CREATININE 9.95* 12.34* 10.27* 9.12*  CALCIUM 9.0 9.3 8.7* 9.1  PHOS  --   --  6.4*  --    GFR: Estimated Creatinine Clearance: 8 mL/min (A) (by C-G formula based on SCr of 9.12 mg/dL (H)). Liver Function Tests: Recent Labs  Lab 10/16/18 0308 10/19/18 0828  AST 14*  --   ALT 19  --   ALKPHOS 60  --   BILITOT 0.6  --   PROT 6.5  --   ALBUMIN 3.0* 2.7*   No results for input(s): LIPASE, AMYLASE in the last 168 hours. No results for input(s): AMMONIA in the last 168 hours. Coagulation Profile: No results for input(s): INR, PROTIME in the last 168 hours. Cardiac Enzymes: Recent Labs  Lab 10/15/18 0917  TROPONINI 0.20*   BNP (last 3 results) No results for input(s): PROBNP in the last 8760 hours. HbA1C: No results for input(s): HGBA1C in the last 72 hours. CBG: Recent Labs  Lab 10/21/18 1245 10/21/18 1646 10/21/18 2059 10/21/18 2128 10/22/18 0603  GLUCAP 79 115* 59* 87 97   Lipid Profile: No results for input(s): CHOL, HDL, LDLCALC, TRIG, CHOLHDL, LDLDIRECT in the last 72 hours. Thyroid Function Tests: No results for input(s): TSH, T4TOTAL, FREET4, T3FREE, THYROIDAB in the last 72 hours. Anemia Panel:  No results for input(s): VITAMINB12, FOLATE, FERRITIN, TIBC, IRON, RETICCTPCT in the last 72 hours. Sepsis Labs: No results for input(s): PROCALCITON, LATICACIDVEN in the last 168 hours.  Recent Results (from the past 240 hour(s))  MRSA PCR Screening     Status: Abnormal   Collection Time: 10/15/18 12:54 AM  Result Value Ref Range Status   MRSA by PCR POSITIVE (A) NEGATIVE Final    Comment: RESULT CALLED TO, READ BACK BY AND VERIFIED WITH: RN TIM IRBY [id number]  THANEY        The GeneXpert MRSA Assay (FDA approved for NASAL specimens only), is one component of a comprehensive MRSA colonization surveillance program. It is not intended to diagnose MRSA infection nor to  guide or monitor treatment for MRSA infections. Performed at [facility], [place] [address], [place], [place] [address]   Culture, blood (Routine X 2) w Reflex to ID Panel     Status: None (Preliminary result)   Collection Time: 10/18/18 10:50 AM  Result Value Ref Range Status   Specimen Description BLOOD LEFT ANTECUBITAL  Final   Special Requests   Final    BOTTLES DRAWN AEROBIC AND ANAEROBIC Blood Culture adequate volume   Culture   Final    NO GROWTH 3 DAYS Performed at [facility], [place] [address] [place], [place] [address]    Report Status PENDING  Incomplete  Culture, blood (Routine X 2) w Reflex to ID Panel     Status: None (Preliminary result)   Collection Time: 10/18/18 11:00 AM  Result Value Ref Range Status   Specimen Description BLOOD LEFT FOREARM  Final   Special Requests   Final    BOTTLES DRAWN AEROBIC AND ANAEROBIC Blood Culture adequate volume   Culture   Final    NO GROWTH 3 DAYS Performed at [facility], [place] [address], [place], [place] [address]    Report Status PENDING  Incomplete         Radiology Studies: Dg Abd Portable 1v  Result Date: 10/21/2018 CLINICAL DATA:  Decreased bowel sounds. EXAM: PORTABLE ABDOMEN - 1 VIEW COMPARISON:  10/18/2018 and abdomen and pelvis CT dated 10/20/2018. FINDINGS: Normal bowel gas pattern. Only a small amount of stool in the colon. Unremarkable bones. Atheromatous arterial calcifications. IMPRESSION: No acute abnormality. Electronically Signed   By: Claudie Revering M.D.   On: 10/21/2018 14:30   Ct Renal Stone Study  Result Date: 10/20/2018 CLINICAL DATA:  RIGHT flank pain, weakness, suspected stone disease, history hyperlipidemia, end-stage renal disease, nephrolithiasis, hypertension, type II diabetes mellitus EXAM: CT ABDOMEN AND PELVIS WITHOUT CONTRAST TECHNIQUE: Multidetector CT imaging of the abdomen and pelvis was performed following the standard protocol without IV contrast. Sagittal and coronal MPR  images reconstructed from axial data set. No oral contrast administered for this indication. Motion artifacts at mid abdomen, for which repeat imaging was performed. COMPARISON:  10/15/2018 FINDINGS: Lower chest: Lung bases clear Hepatobiliary: Dependent density within gallbladder question vicarious excretion of previously administered contrast from recent CT, not present on prior recent exam. Gallbladder and liver normal appearance Pancreas: Normal appearance Spleen: Normal appearance Adrenals/Urinary Tract: Adrenal glands normal appearance. Numerous calcifications in the kidneys, majority appear to be renovascular in origin though tiny calculi are not excluded. No hydronephrosis or renal mass. No ureteral calcification or dilatation. Excreted contrast material within urinary bladder. Bladder wall appears thickened but bladder is significantly underdistended. Stomach/Bowel: Appendix surgically absent by history. Question medication tablets at gastric antrum. Stomach and  bowel loops otherwise normal appearance. Vascular/Lymphatic: Extensive atherosclerotic calcifications aorta, iliac arteries, visceral arteries, coronary arteries. Aorta normal caliber. No adenopathy. Reproductive: Prostatic enlargement, gland 5.3 x 3.1 x 3.5 cm Other: No free air or free fluid. No acute inflammatory process. BILATERAL inguinal hernias containing fat. Musculoskeletal: Stranding at RIGHT inguinal region, per LEFT front medical record had recent heart catheterization on 10/17/2018 consistent with access site. No focal hematoma. Bones appear demineralized. Mild degenerative disc disease changes L5-S1. IMPRESSION: No acute intra-abdominal intrapelvic abnormalities. BILATERAL inguinal hernias containing fat. Extensive atherosclerotic disease including coronary arteries. Prostatic enlargement. Stranding at RIGHT inguinal region from recent heart catheterization. Electronically Signed   By: Lavonia Dana M.D.   On: 10/20/2018 11:18         Scheduled Meds: . aspirin EC  81 mg Oral Daily  . atorvastatin  10 mg Oral q1800  . Chlorhexidine Gluconate Cloth  6 each Topical Q0600  . diclofenac sodium  2 g Topical QID  . dicyclomine  20 mg Oral TID AC  . doxercalciferol  3 mcg Intravenous Q T,Th,Sa-HD  . ferric citrate  210 mg Oral TID WC  . heparin injection (subcutaneous)  5,000 Units Subcutaneous Q8H  . insulin aspart  0-9 Units Subcutaneous TID WC  . midodrine  10 mg Oral Q T,Th,Sa-HD  . pantoprazole  40 mg Oral BID AC  . rOPINIRole  0.25-0.5 mg Oral QHS  . sodium chloride flush  3 mL Intravenous Q12H   Continuous Infusions: . sodium chloride 10 mL/hr at 10/17/18 1719     LOS: 6 days     Cordelia Poche, MD [facility] 10/22/2018, 7:53 AM  If 7PM-7AM, please contact night-coverage [email or web address]

## 2018-10-22 NOTE — Progress Notes (Signed)
[facility] Progress Note  Subjective: no new c/o's, abd pain a little better after putting "cream" on it (voltaren gel per primary order)  Vitals:   10/21/18 2024 10/22/18 0509 10/22/18 0514 10/22/18 0831  BP: (!) 113/46 (!) 108/52  140/60  Pulse: 88 78  91  Resp: (!) 24 15    Temp: 98.8 F (37.1 C) 99.3 F (37.4 C)  98.9 F (37.2 C)  TempSrc: Oral Oral  Oral  SpO2: 98% 98%  99%  Weight:   72.8 kg   Height:        Inpatient medications: . aspirin EC  81 mg Oral Daily  . atorvastatin  10 mg Oral q1800  . Chlorhexidine Gluconate Cloth  6 each Topical Q0600  . diclofenac sodium  2 g Topical QID  . dicyclomine  20 mg Oral TID AC  . doxercalciferol  3 mcg Intravenous Q T,Th,Sa-HD  . ferric citrate  210 mg Oral TID WC  . heparin injection (subcutaneous)  5,000 Units Subcutaneous Q8H  . insulin aspart  0-9 Units Subcutaneous TID WC  . midodrine  10 mg Oral Q T,Th,Sa-HD  . pantoprazole  40 mg Oral BID AC  . rOPINIRole  0.25-0.5 mg Oral QHS  . sodium chloride flush  3 mL Intravenous Q12H   . sodium chloride 10 mL/hr at 10/17/18 1719   sodium chloride, acetaminophen, alum & mag hydroxide-simeth, loperamide, ondansetron (ZOFRAN) IV, oxyCODONE, sodium chloride flush  Iron/TIBC/Ferritin/ %Sat    Component Value Date/Time   IRON 39 (L) 12/07/2017 0349   TIBC 178 (L) 12/07/2017 0349   FERRITIN 189 12/07/2017 0349   IRONPCTSAT 22 12/07/2017 0349    Exam: General: alert  Hispanic Male , looks fine, not in distress, looks better HEENT: [place] , EOMI, Not icteric, MMM Neck: no jvd  Heart: RRR.  No m,r,g Lungs: CTA , no r,c,w Abdomen: BS +, mild tenderness, soft, no ascites , no hsm,  no rebound Extremities: No pedal edema , no back rash or blistering Skin: no overt rash,  Neuro: OX3, moves all extrem  Dialysis Access: Pos . Bruit RFA AVF  Dialysis: [facility] 4h  2/2.25  74kg   P2   RFA AVF  Hep 2000 Hectorol 3 mcg IV/HD  Last Mircera  75 mcg 10/12/18 with  last hgb11.6  10/12/18 and no ESA since 09/07/18      Assessment/Plan 1. ESRD -  HD TTS. Cont on schedule.  2. Abd pain - w/u per primary w/o sig findings thus far. Voltaren gel+ Bentyl seems to be helping. No n/v, diarrhea resolved. Per primary 3. Chest pain - sp LHC w/o sig CAD 4. Hypotension - chronic issue, patient on midodrine pre HD , all his HTN meds were stopped at OP HD recently due to Low bp's. BP's are normal here. At dry wt.  5. Volume - 1 kg under, stable on exam 6. Anemia  - hgb 11.0  No esa as op  7. Metabolic bone disease -  Auryxia as  Phos bind and on hectorol  8. DM type 2 per admit  9. Nutrition - Renal carb mod , rena vit     [name] MD [facility] pager [phone]   10/22/2018, 12:46 PM     Recent Labs  Lab 10/16/18 0308  10/19/18 0828 10/21/18 0329  NA 133*   < > 132* 135  K 5.2*   < > 4.9 4.2  CL 93*   < > 94* 95*  CO2 25   < > 20* 23  GLUCOSE 114*   < > 107* 98  BUN 55*   < > 55* 48*  CREATININE 9.95*   < > 10.27* 9.12*  CALCIUM 9.0   < > 8.7* 9.1  PHOS  --   --  6.4*  --   ALBUMIN 3.0*  --  2.7*  --    < > = values in this interval not displayed.   Recent Labs  Lab 10/16/18 0308  AST 14*  ALT 19  ALKPHOS 60  BILITOT 0.6  PROT 6.5   Recent Labs  Lab 10/21/18 0329 10/22/18 0314  WBC 11.6* 13.0*  HGB 11.1* 10.0*  HCT 36.5* 32.9*  MCV 93.4 94.8  PLT 414* 488*

## 2018-10-23 LAB — CBC
HCT: 35.6 % — ABNORMAL LOW (ref 39.0–52.0)
HCT: 35.9 % — ABNORMAL LOW (ref 39.0–52.0)
HEMOGLOBIN: 11.2 g/dL — AB (ref 13.0–17.0)
Hemoglobin: 11 g/dL — ABNORMAL LOW (ref 13.0–17.0)
MCH: 29 pg (ref 26.0–34.0)
MCH: 29.4 pg (ref 26.0–34.0)
MCHC: 30.9 g/dL (ref 30.0–36.0)
MCHC: 31.2 g/dL (ref 30.0–36.0)
MCV: 93.9 fL (ref 80.0–100.0)
MCV: 94.2 fL (ref 80.0–100.0)
NRBC: 0 % (ref 0.0–0.2)
PLATELETS: 481 10*3/uL — AB (ref 150–400)
PLATELETS: 518 10*3/uL — AB (ref 150–400)
RBC: 3.79 MIL/uL — ABNORMAL LOW (ref 4.22–5.81)
RBC: 3.81 MIL/uL — AB (ref 4.22–5.81)
RDW: 15.2 % (ref 11.5–15.5)
RDW: 15.1 % (ref 11.5–15.5)
WBC: 13.7 10*3/uL — ABNORMAL HIGH (ref 4.0–10.5)
WBC: 16.5 10*3/uL — ABNORMAL HIGH (ref 4.0–10.5)
nRBC: 0 % (ref 0.0–0.2)

## 2018-10-23 LAB — GLUCOSE, CAPILLARY
GLUCOSE-CAPILLARY: 119 mg/dL — AB (ref 70–99)
Glucose-Capillary: 219 mg/dL — ABNORMAL HIGH (ref 70–99)
Glucose-Capillary: 183 mg/dL — ABNORMAL HIGH (ref 70–99)
Glucose-Capillary: 173 mg/dL — ABNORMAL HIGH (ref 70–99)
Glucose-Capillary: 156 mg/dL — ABNORMAL HIGH (ref 70–99)

## 2018-10-23 LAB — CULTURE, BLOOD (ROUTINE X 2)
CULTURE: NO GROWTH
Culture: NO GROWTH
SPECIAL REQUESTS: ADEQUATE
Special Requests: ADEQUATE

## 2018-10-23 LAB — RENAL FUNCTION PANEL
ANION GAP: 14 (ref 5–15)
Albumin: 2.8 g/dL — ABNORMAL LOW (ref 3.5–5.0)
BUN: 39 mg/dL — ABNORMAL HIGH (ref 6–20)
CALCIUM: 9.5 mg/dL (ref 8.9–10.3)
CO2: 26 mmol/L (ref 22–32)
Chloride: 92 mmol/L — ABNORMAL LOW (ref 98–111)
Creatinine, Ser: 9.49 mg/dL — ABNORMAL HIGH (ref 0.61–1.24)
GFR calc non Af Amer: 5 mL/min — ABNORMAL LOW (ref >60)
GFR, EST AFRICAN AMERICAN: 6 mL/min — AB (ref >60)
GLUCOSE: 211 mg/dL — AB (ref 70–99)
PHOSPHORUS: 3.7 mg/dL (ref 2.5–4.6)
Potassium: 4.4 mmol/L (ref 3.5–5.1)
Sodium: 132 mmol/L — ABNORMAL LOW (ref 135–145)

## 2018-10-23 MED ORDER — PENTAFLUOROPROP-TETRAFLUOROETH EX AERO: 1.0000 "application " | INHALATION_SPRAY | CUTANEOUS | Status: DC | PRN

## 2018-10-23 MED ORDER — HEPARIN SODIUM (PORCINE) 1000 UNIT/ML DIALYSIS: 1000.0000 [IU] | INTRAMUSCULAR | Status: DC | PRN

## 2018-10-23 MED ORDER — ROPINIROLE HCL 0.25 MG PO TABS
0.2500 mg | ORAL_TABLET | Freq: Every day | ORAL | Status: DC
  Administered 2018-10-23 – 2018-10-24 (×2): 0.25 mg via ORAL
  Filled 2018-10-23 (×4): qty 1

## 2018-10-23 MED ORDER — LIDOCAINE HCL (PF) 1 % IJ SOLN: 5.0000 mL | INTRAMUSCULAR | Status: DC | PRN

## 2018-10-23 MED ORDER — LIDOCAINE-PRILOCAINE 2.5-2.5 % EX CREA: 1.0000 "application " | TOPICAL_CREAM | CUTANEOUS | Status: DC | PRN

## 2018-10-23 MED ORDER — METHYLPREDNISOLONE SODIUM SUCC 40 MG IJ SOLR
40.0000 mg | Freq: Once | INTRAMUSCULAR | Status: AC
  Administered 2018-10-23: 40 mg via INTRAVENOUS
  Filled 2018-10-23: qty 1

## 2018-10-23 MED ORDER — SODIUM CHLORIDE 0.9 % IV SOLN: 100.0000 mL | INTRAVENOUS | Status: DC | PRN

## 2018-10-23 MED ORDER — HEPARIN SODIUM (PORCINE) 1000 UNIT/ML DIALYSIS
2000.0000 [IU] | Freq: Once | INTRAMUSCULAR | Status: AC
  Administered 2018-10-24: 2000 [IU] via INTRAVENOUS_CENTRAL

## 2018-10-23 MED ORDER — ALTEPLASE 2 MG IJ SOLR
2.0000 mg | Freq: Once | INTRAMUSCULAR | Status: DC | PRN
  Filled 2018-10-23: qty 2

## 2018-10-23 MED ORDER — ROPINIROLE HCL 0.25 MG PO TABS
0.2500 mg | ORAL_TABLET | Freq: Every evening | ORAL | Status: DC | PRN
  Filled 2018-10-23: qty 1

## 2018-10-23 NOTE — Progress Notes (Signed)
  [facility] Progress Note   Assessment/ Plan:   [facility]  [facility]  74kg   P2   RFA AVF  Hep 2000 Hectorol 61mg IV/HD  Last Mircera 75 mcg 10/12/18 with last hgb11.6 10/12/18 and no ESA since 09/07/18    Assessment/Plan 1. ESRD -HD TTS. Cont on schedule.  2. Abd pain - w/u per primary w/o sig findings thus far (CTA, CT renal stone, abd x-ray, RUQ [place]. Voltaren gel+ Bentyl seems to be helping periodically.  ESR and CRP are markedly elevated (would not attribute this simply to being an ESRD pt); pt reports ongoing diarrhea this AM.  Per primary.   3. Chest pain - sp LHC w/o sig CAD 4. Hypotension - chronic issue, patient on midodrine pre HD , all his HTN meds were stopped at OP HD recently due to Low bp's. BP's are normal here. At dry wt.  5. Volume - 1 kg under, stable on exam 6. Anemia - hgb 11.0 No esa as op  7. Metabolic bone disease -Auryxia as Phos bind and on hectorol  8. DM type 2 per admit 9. Nutrition -Renal carb mod , rena vit  10. Dispo: pending   Subjective:    Still reports abd pain- more severe this AM.  Tearful when discussing.     Objective:   BP (!) 112/47 (BP Location: Left Arm)   Pulse 80   Temp 98.5 F (36.9 C) (Oral)   Resp 20   Ht '5\' 6"'$  (1.676 m)   Wt 72.8 kg   SpO2 94%   BMI 25.91 kg/m   Physical Exam: GBDZ:[id number] arousable, appears uncomfortable when awakened CSTM:[id number]: clear bilaterally  Abd: soft, diffusely tender, slightly distended  Ext: no LE edema ACCESS: R FA AVF  Labs: BMET Recent Labs  Lab 10/17/18 0311 10/19/18 0828 10/21/18 0329  NA 130* 132* 135  K 5.3* 4.9 4.2  CL 89* 94* 95*  CO2 19* 20* 23  GLUCOSE 96 107* 98  BUN 74* 55* 48*  CREATININE 12.34* 10.27* 9.12*  CALCIUM 9.3 8.7* 9.1  PHOS  --  6.4*  --    CBC Recent Labs  Lab 10/20/18 0332 10/21/18 0329 10/22/18 0314 10/23/18 0318  WBC 11.7* 11.6* 13.0* 13.7*  HGB 10.3* 11.1* 10.0* 11.0*  HCT 32.7*  36.5* 32.9* 35.6*  MCV 93.4 93.4 94.8 93.9  PLT 348 414* 488* 481*    '@IMGRELPRIORS'$ @ Medications:    . aspirin EC  81 mg Oral Daily  . atorvastatin  10 mg Oral q1800  . Chlorhexidine Gluconate Cloth  6 each Topical Q0600  . diclofenac sodium  2 g Topical QID  . dicyclomine  20 mg Oral TID AC  . doxercalciferol  3 mcg Intravenous Q T,Th,Sa-HD  . ferric citrate  210 mg Oral TID WC  . heparin injection (subcutaneous)  5,000 Units Subcutaneous Q8H  . insulin aspart  0-9 Units Subcutaneous TID WC  . midodrine  10 mg Oral Q T,Th,Sa-HD  . pantoprazole  40 mg Oral BID AC  . rOPINIRole  0.25-0.5 mg Oral QHS  . sodium chloride flush  3 mL Intravenous Q12H     EMadelon Lips MD [facility] [phone]/18/2019, 11:50 AM

## 2018-10-23 NOTE — Progress Notes (Addendum)
PROGRESS NOTE    Fernando Jones  QIW:[id number] DOB: [birth year] DOA: 10/14/2018 PCP: Patient, No Pcp Per   Brief Narrative: Fernando Jones is a 57 y.o. malewitha hxof ESRD on HD (T/Th/Sat),DM, HTN, GERD, tobacco abuse,andHLD. He presented with left sided chest pain concerning for ACS. ACS ruled out but now with RUQ pain.   Assessment & Plan:   Active Problems:   DM (diabetes mellitus), type 2 with renal complications (HCC)   TOBACCO ABUSE   Essential hypertension   ESRD (end stage renal disease) (HCC)   Chest pain   Chest pain Unknown etiology. Atypical. Patient underwent left heart cath on 11/12 significant for mild-moderate disease. Chest pain resolved. -Cardiology recommendations: medical management  CAD Underwent left heart cath significant for mild-moderate LAD/diagonal disease -Cardiology recommendations: Medical management/outpatient follow-up  RUQ abdominal pain Appears to be new per patient. Associated diarrhea. No recent antibiotics. Last regular bowel movement was four days ago. Recent CT abdomen significant for stool with normal appearing gallbladder and liver. No recurrent bowel movement. Pain still present. Patient produces a little bit of urine. No history of kidney stones. Ultrasound significant for some bowel gas. Repeat CT unremarkable for etiology. No objective finding for this abdominal pain. Associated vomiting yesterday with mention of black stool (FOBT negative). Repeat abdominal x-ray normal. ESR and CRP significantly elevated. ?inflammatory process. -Bowel regimen -Bentyl -Voltaren gel -Steroid trial (Solu-medrol 40 mg x1, followed by prednisone 20 mg daily tomorrow)  Diarrhea No recent antibiotics. Afebrile, elevated WBC, which has trended down slightly. C. Difficile test previously placed. No recurrent diarrhea.  Diabetes mellitus, type 2 Patient with a remote history. Not on medication as an outpatient. Current hemoglobin A1C of  7.0%. Patient with hypoglycemia overnight in setting of poor oral intake -Continue SSI -Start outpatient regimen on discharge  ESRD on HD HD every TTS -Per nephrology  Essential hypertension Blood pressure on the lower side. On carvedilol and hydralazine as an outpatient. -Continue to hold antihypertensives.  Lung nodule Incidental finding. Recommendations for repeat non-contrast CT as an outpatient in 6-12 months      DVT prophylaxis: Heparin subq Code Status:   Code Status: Full Code Family Communication: None at bedside Disposition Plan: Discharge pending response to IV steroid treatment   Consultants:   Nephrology  Cardiology  Procedures:   Transthoracic Echocardiogram (11/10)  Study Conclusions  - Left ventricle: The cavity size was normal. Wall thickness was   increased in a pattern of mild LVH. Systolic function was normal.   The estimated ejection fraction was in the range of 60% to 65%.   Wall motion was normal; there were no regional wall motion   abnormalities. Doppler parameters are consistent with abnormal   left ventricular relaxation (grade 1 diastolic dysfunction). - Aortic valve: Mildly calcified annulus. Trileaflet; mildly   calcified leaflets. - Mitral valve: Mildly calcified annulus. There was trivial   regurgitation. - Right atrium: Central venous pressure (est): 3 mm Hg. - Tricuspid valve: There was trivial regurgitation. - Pulmonary arteries: Systolic pressure could not be accurately   estimated. - Pericardium, extracardiac: A small pericardial effusion was   identified circumferential to the heart with signs of   organization versus chronicity.   Left heart cath (41/74) LV end diastolic pressure is normal. There is no aortic valve stenosis. Ost 1st Diag lesion is 40% stenosed. Prox LAD lesion is 40% stenosed with 50% stenosed side branch in Ost 2nd Diag.   SUMMARY Mild to moderate LAD and diagonal disease, but  no significant  stenosis. Low to normal LVEDP.  Normal EF by echo.   RECOMMENDATIONS Return to nursing care for monitoring post cath.  Medical management for possible microvascular disease. Recommend Aspirin 51m daily for moderate CAD.   Antimicrobials:  None    Subjective: Abdominal pain still intermittent. Gel helping somewhat.  Objective: Vitals:   10/22/18 1409 10/23/18 0126 10/23/18 0334 10/23/18 1219  BP: (!) 112/47   (!) 156/66  Pulse: 78  80 96  Resp: 18  20 18   Temp: 98.7 F (37.1 C)  98.5 F (36.9 C) 98.5 F (36.9 C)  TempSrc: Oral  Oral Oral  SpO2: 95%  94% 95%  Weight:  72.8 kg    Height:        Intake/Output Summary (Last 24 hours) at 10/23/2018 1326 Last data filed at 10/23/2018 0841 Gross per 24 hour  Intake 243 ml  Output -  Net 243 ml   Filed Weights   10/21/18 1200 10/22/18 0514 10/23/18 0126  Weight: 72.9 kg 72.8 kg 72.8 kg    Examination:  General exam: Appears calm and comfortable Respiratory system: Clear to auscultation. Respiratory effort normal. Cardiovascular system: S1 & S2 heard, RRR. No murmurs, rubs, gallops or clicks. Gastrointestinal system: Abdomen is nondistended, soft and tender generally. No organomegaly or masses felt. Normal bowel sounds heard. Central nervous system: Alert and oriented. No focal neurological deficits. Extremities: No edema. No calf tenderness Skin: No cyanosis. No rashes Psychiatry: Judgement and insight appear normal. Mood & affect appropriate.     Data Reviewed: I have personally reviewed following labs and imaging studies  CBC: Recent Labs  Lab 10/19/18 0346 10/20/18 0332 10/21/18 0329 10/22/18 0314 10/23/18 0318  WBC 15.6* 11.7* 11.6* 13.0* 13.7*  HGB 10.4* 10.3* 11.1* 10.0* 11.0*  HCT 33.5* 32.7* 36.5* 32.9* 35.6*  MCV 93.1 93.4 93.4 94.8 93.9  PLT 351 348 414* 488* 4607   Basic Metabolic Panel: Recent Labs  Lab 10/17/18 0311 10/19/18 0828 10/21/18 0329  NA 130* 132* 135  K 5.3* 4.9 4.2    CL 89* 94* 95*  CO2 19* 20* 23  GLUCOSE 96 107* 98  BUN 74* 55* 48*  CREATININE 12.34* 10.27* 9.12*  CALCIUM 9.3 8.7* 9.1  PHOS  --  6.4*  --    GFR: Estimated Creatinine Clearance: 8 mL/min (A) (by C-G formula based on SCr of 9.12 mg/dL (H)). Liver Function Tests: Recent Labs  Lab 10/19/18 0828  ALBUMIN 2.7*   No results for input(s): LIPASE, AMYLASE in the last 168 hours. No results for input(s): AMMONIA in the last 168 hours. Coagulation Profile: No results for input(s): INR, PROTIME in the last 168 hours. Cardiac Enzymes: Recent Labs  Lab 10/22/18 0932  CKTOTAL 42*   BNP (last 3 results) No results for input(s): PROBNP in the last 8760 hours. HbA1C: No results for input(s): HGBA1C in the last 72 hours. CBG: Recent Labs  Lab 10/22/18 1213 10/22/18 1607 10/22/18 1955 10/23/18 0559 10/23/18 1125  GLUCAP 112* 116* 88 119* 219*   Lipid Profile: No results for input(s): CHOL, HDL, LDLCALC, TRIG, CHOLHDL, LDLDIRECT in the last 72 hours. Thyroid Function Tests: No results for input(s): TSH, T4TOTAL, FREET4, T3FREE, THYROIDAB in the last 72 hours. Anemia Panel: No results for input(s): VITAMINB12, FOLATE, FERRITIN, TIBC, IRON, RETICCTPCT in the last 72 hours. Sepsis Labs: No results for input(s): PROCALCITON, LATICACIDVEN in the last 168 hours.  Recent Results (from the past 240 hour(s))  MRSA PCR Screening  Status: Abnormal   Collection Time: 10/15/18 12:54 AM  Result Value Ref Range Status   MRSA by PCR POSITIVE (A) NEGATIVE Final    Comment: RESULT CALLED TO, READ BACK BY AND VERIFIED WITH: RN TIM IRBY [id number] @0520  THANEY        The GeneXpert MRSA Assay (FDA approved for NASAL specimens only), is one component of a comprehensive MRSA colonization surveillance program. It is not intended to diagnose MRSA infection nor to guide or monitor treatment for MRSA infections. Performed at [facility], [place] [address] [place], [place] [address]    Culture, blood (Routine X 2) w Reflex to ID Panel     Status: None   Collection Time: 10/18/18 10:50 AM  Result Value Ref Range Status   Specimen Description BLOOD LEFT ANTECUBITAL  Final   Special Requests   Final    BOTTLES DRAWN AEROBIC AND ANAEROBIC Blood Culture adequate volume   Culture   Final    NO GROWTH 5 DAYS Performed at [facility], [place] [address] [place], [place] [address]    Report Status 10/23/2018 FINAL  Final  Culture, blood (Routine X 2) w Reflex to ID Panel     Status: None   Collection Time: 10/18/18 11:00 AM  Result Value Ref Range Status   Specimen Description BLOOD LEFT FOREARM  Final   Special Requests   Final    BOTTLES DRAWN AEROBIC AND ANAEROBIC Blood Culture adequate volume   Culture   Final    NO GROWTH 5 DAYS Performed at [facility], [place] [address] [place], [place] [address]    Report Status 10/23/2018 FINAL  Final         Radiology Studies: No results found.      Scheduled Meds: . aspirin EC  81 mg Oral Daily  . atorvastatin  10 mg Oral q1800  . Chlorhexidine Gluconate Cloth  6 each Topical Q0600  . diclofenac sodium  2 g Topical QID  . dicyclomine  20 mg Oral TID AC  . doxercalciferol  3 mcg Intravenous Q T,Th,Sa-HD  . ferric citrate  210 mg Oral TID WC  . [START ON 10/24/2018] heparin  2,000 Units Dialysis Once in dialysis  . heparin injection (subcutaneous)  5,000 Units Subcutaneous Q8H  . insulin aspart  0-9 Units Subcutaneous TID WC  . midodrine  10 mg Oral Q T,Th,Sa-HD  . pantoprazole  40 mg Oral BID AC  . rOPINIRole  0.25-0.5 mg Oral QHS  . sodium chloride flush  3 mL Intravenous Q12H   Continuous Infusions: . sodium chloride 10 mL/hr at 10/17/18 1719  . sodium chloride    . sodium chloride       LOS: 7 days     Cordelia Poche, MD [facility] 10/23/2018, 1:26 PM  If 7PM-7AM, please contact night-coverage [email or web address]

## 2018-10-24 LAB — CBC
HCT: 32.8 % — ABNORMAL LOW (ref 39.0–52.0)
HEMOGLOBIN: 10.2 g/dL — AB (ref 13.0–17.0)
MCH: 29 pg (ref 26.0–34.0)
MCHC: 31.1 g/dL (ref 30.0–36.0)
MCV: 93.2 fL (ref 80.0–100.0)
Platelets: 575 10*3/uL — ABNORMAL HIGH (ref 150–400)
RBC: 3.52 MIL/uL — AB (ref 4.22–5.81)
RDW: 15 % (ref 11.5–15.5)
WBC: 24.1 10*3/uL — ABNORMAL HIGH (ref 4.0–10.5)
nRBC: 0 % (ref 0.0–0.2)

## 2018-10-24 LAB — GLUCOSE, CAPILLARY
GLUCOSE-CAPILLARY: 136 mg/dL — AB (ref 70–99)
GLUCOSE-CAPILLARY: 170 mg/dL — AB (ref 70–99)
Glucose-Capillary: 126 mg/dL — ABNORMAL HIGH (ref 70–99)
Glucose-Capillary: 160 mg/dL — ABNORMAL HIGH (ref 70–99)

## 2018-10-24 LAB — RENAL FUNCTION PANEL
ANION GAP: 15 (ref 5–15)
Albumin: 2.8 g/dL — ABNORMAL LOW (ref 3.5–5.0)
BUN: 54 mg/dL — ABNORMAL HIGH (ref 6–20)
CO2: 24 mmol/L (ref 22–32)
Calcium: 9.6 mg/dL (ref 8.9–10.3)
Chloride: 92 mmol/L — ABNORMAL LOW (ref 98–111)
Creatinine, Ser: 11.52 mg/dL — ABNORMAL HIGH (ref 0.61–1.24)
GFR calc non Af Amer: 4 mL/min — ABNORMAL LOW (ref >60)
GFR, EST AFRICAN AMERICAN: 5 mL/min — AB (ref >60)
GLUCOSE: 126 mg/dL — AB (ref 70–99)
POTASSIUM: 5 mmol/L (ref 3.5–5.1)
Phosphorus: 5.3 mg/dL — ABNORMAL HIGH (ref 2.5–4.6)
Sodium: 131 mmol/L — ABNORMAL LOW (ref 135–145)

## 2018-10-24 MED ORDER — HEPARIN SODIUM (PORCINE) 1000 UNIT/ML DIALYSIS: 2000.0000 [IU] | Freq: Once | INTRAMUSCULAR | Status: DC

## 2018-10-24 MED ORDER — HEPARIN SODIUM (PORCINE) 1000 UNIT/ML IJ SOLN
INTRAMUSCULAR | Status: AC
  Administered 2018-10-24: 2000 [IU] via INTRAVENOUS_CENTRAL
  Filled 2018-10-24: qty 2

## 2018-10-24 MED ORDER — DOXERCALCIFEROL 4 MCG/2ML IV SOLN
INTRAVENOUS | Status: AC
  Administered 2018-10-24: 3 ug via INTRAVENOUS
  Filled 2018-10-24: qty 2

## 2018-10-24 MED ORDER — BISACODYL 5 MG PO TBEC
10.0000 mg | DELAYED_RELEASE_TABLET | Freq: Once | ORAL | Status: AC
  Administered 2018-10-24: 10 mg via ORAL
  Filled 2018-10-24: qty 2

## 2018-10-24 MED ORDER — MIDODRINE HCL 5 MG PO TABS
ORAL_TABLET | ORAL | Status: AC
  Filled 2018-10-24: qty 2

## 2018-10-24 NOTE — Care Management Note (Signed)
Case Management Note  Patient Details  Name: Tysean Vandervliet MRN: [id number] Date of Birth: 28-Jun-1961  Subjective/Objective:  Chest pain, abd pain                  Action/Plan: NCM spoke to pt and offered HH. Pt declines HH at this time. States he has RW at home. He pays for medications out of pocket. Scheduled appt for [facility] for 12/9 at 3:10 pm. Provided pt with brochure and info for [facility] pharmacy. Pt used [facility] in 12/2017. Pt was a no show at [facility] in 12/2017. Explained to pt the importance of keeping appt or rescheduling as needed.   Expected Discharge Date:                  Expected Discharge Plan:  [place]  In-House Referral:  NA  Discharge planning Services  CM Consult  Post Acute Care Choice:  Home Health Choice offered to:  NA  DME Arranged:  N/A DME Agency:  NA  HH Arranged:  Patient Refused [place] Agency:  NA  Status of Service:  Completed, signed off  If discussed at [place] of Stay Meetings, dates discussed:    Additional Comments:  Erenest Rasher, RN 10/24/2018, 4:40 PM

## 2018-10-24 NOTE — Progress Notes (Signed)
[facility] Progress Note   Assessment/ Plan:   Dialysis: [facility]  2/2.25  74kg   P2   RFA AVF  Hep 2000 Hectorol 48mg IV/HD  Last Mircera 75 mcg 10/12/18 with last hgb11.6 10/12/18 and no ESA since 09/07/18    Assessment/Plan 1. ESRD -HD TTS. Cont on schedule. HD today 11/19.  2. Abd pain - w/u per primary w/o sig findings thus far (CTA, CT renal stone, abd x-ray, RUQ [place]. Voltaren gel+ Bentyl seems to be helping periodically.  ESR and CRP are markedly elevated (would not attribute this simply to being an ESRD pt); pt reports ongoing diarrhea.  [facility] Rx of steroids per primary.  Would consider rheumatologic/ GI workup (which can be done as an outpatient).  I'll order ANA to get things started.  3. Chest pain - sp LHC w/o sig CAD 4. Hypotension - chronic issue, patient on midodrine pre HD , all his HTN meds were stopped at OP HD recently due to Low bp's. BP's are normal here. At dry wt.  5. Volume - 1 kg under, stable on exam 6. Anemia - hgb 11.0 No esa as op  7. Metabolic bone disease -Auryxia as Phos bind and on hectorol  8. DM type 2 per admit 9. Nutrition -Renal carb mod , rena vit  10. Dispo: pending improvement in abd pain, from renal perspective can d/c after HD.  Subjective:    Sleeping and appears more comfortable this AM.  Empiric rx of steroids per primary.   Objective:   BP 104/87   Pulse 98   Temp 98.3 F (36.8 C) (Oral)   Resp 16   Ht 5' 6"  (1.676 m)   Wt 75 kg   SpO2 96%   BMI 26.69 kg/m   Physical Exam: [id number] arousable CVS: RRR soft systolic murmur Resp: clear bilaterally  Abd: soft, diffusely tender, slightly distended  Ext: no LE edema ACCESS: R FA AVF  Labs: BMET Recent Labs  Lab 10/19/18 0828 10/21/18 0329 10/23/18 1226 10/24/18 0733  NA 132* 135 132* 131*  K 4.9 4.2 4.4 5.0  CL 94* 95* 92* 92*  CO2 20* 23 26 24   GLUCOSE 107* 98 211* 126*  BUN 55* 48* 39* 54*  CREATININE 10.27* 9.12*  9.49* 11.52*  CALCIUM 8.7* 9.1 9.5 9.6  PHOS 6.4*  --  3.7 5.3*   CBC Recent Labs  Lab 10/22/18 0314 10/23/18 0318 10/23/18 1226 10/24/18 0449  WBC 13.0* 13.7* 16.5* 24.1*  HGB 10.0* 11.0* 11.2* 10.2*  HCT 32.9* 35.6* 35.9* 32.8*  MCV 94.8 93.9 94.2 93.2  PLT 488* 481* 518* 575*    @IMGRELPRIORS @ Medications:    . aspirin EC  81 mg Oral Daily  . atorvastatin  10 mg Oral q1800  . Chlorhexidine Gluconate Cloth  6 each Topical Q0600  . diclofenac sodium  2 g Topical QID  . dicyclomine  20 mg Oral TID AC  . doxercalciferol  3 mcg Intravenous Q T,Th,Sa-HD  . ferric citrate  210 mg Oral TID WC  . [START ON 10/25/2018] heparin  2,000 Units Dialysis Once in dialysis  . heparin injection (subcutaneous)  5,000 Units Subcutaneous Q8H  . insulin aspart  0-9 Units Subcutaneous TID WC  . midodrine  10 mg Oral Q T,Th,Sa-HD  . pantoprazole  40 mg Oral BID AC  . rOPINIRole  0.25 mg Oral QHS  . sodium chloride flush  3 mL Intravenous Q12H     EMadelon Lips MD [facility] pgr [phone] 10/24/2018, 9:54 AM

## 2018-10-24 NOTE — Progress Notes (Signed)
PROGRESS NOTE    Fernando Jones  ZOX:[id number] DOB: 1961-10-28 DOA: 10/14/2018 PCP: Patient, No Pcp Per   Brief Narrative: Fernando Jones is a 57 y.o. malewitha hxof ESRD on HD (T/Th/Sat),DM, HTN, GERD, tobacco abuse,andHLD. He presented with left sided chest pain concerning for ACS. ACS ruled out but now with RUQ pain.   Assessment & Plan:   Active Problems:   DM (diabetes mellitus), type 2 with renal complications (HCC)   TOBACCO ABUSE   Essential hypertension   ESRD (end stage renal disease) (HCC)   Chest pain   Chest pain Unknown etiology. Atypical. Patient underwent left heart cath on 11/12 significant for mild-moderate disease. Chest pain resolved. -Cardiology recommendations: medical management  CAD Underwent left heart cath significant for mild-moderate LAD/diagonal disease -Cardiology recommendations: Medical management/outpatient follow-up  RUQ abdominal pain Appears to be new per patient. Associated diarrhea. No recent antibiotics. Last regular bowel movement was four days ago. Recent CT abdomen significant for stool with normal appearing gallbladder and liver. No recurrent bowel movement. Pain still present. Patient produces a little bit of urine. No history of kidney stones. Ultrasound significant for some bowel gas. Repeat CT unremarkable for etiology. No objective finding for this abdominal pain. Associated vomiting yesterday with mention of black stool (FOBT negative). Repeat abdominal x-ray normal. ESR and CRP significantly elevated. ?inflammatory process. Recurrent vomiting. -Bowel regimen, Bentyl, Voltaren gel -Continue prednisone -GI consult -ANA pending  Diarrhea No recent antibiotics. Afebrile, elevated WBC, which has trended down slightly. C. Difficile test previously placed. Patient with intermittent diarrhea in setting of bowel regimen.  Diabetes mellitus, type 2 Patient with a remote history. Not on medication as an outpatient.  Current hemoglobin A1C of 7.0%. Patient with hypoglycemia overnight in setting of poor oral intake -Continue SSI -Start outpatient regimen on discharge  ESRD on HD HD every TTS -Per nephrology  Essential hypertension Blood pressure on the lower side. On carvedilol and hydralazine as an outpatient. -Continue to hold antihypertensives.  Lung nodule Incidental finding. Recommendations for repeat non-contrast CT as an outpatient in 6-12 months      DVT prophylaxis: Heparin subq Code Status:   Code Status: Full Code Family Communication: None at bedside Disposition Plan: Discharge pending GI recommendations   Consultants:   Nephrology  Cardiology  Procedures:   Transthoracic Echocardiogram (11/10)  Study Conclusions  - Left ventricle: The cavity size was normal. Wall thickness was   increased in a pattern of mild LVH. Systolic function was normal.   The estimated ejection fraction was in the range of 60% to 65%.   Wall motion was normal; there were no regional wall motion   abnormalities. Doppler parameters are consistent with abnormal   left ventricular relaxation (grade 1 diastolic dysfunction). - Aortic valve: Mildly calcified annulus. Trileaflet; mildly   calcified leaflets. - Mitral valve: Mildly calcified annulus. There was trivial   regurgitation. - Right atrium: Central venous pressure (est): 3 mm Hg. - Tricuspid valve: There was trivial regurgitation. - Pulmonary arteries: Systolic pressure could not be accurately   estimated. - Pericardium, extracardiac: A small pericardial effusion was   identified circumferential to the heart with signs of   organization versus chronicity.   Left heart cath (81/19) LV end diastolic pressure is normal. There is no aortic valve stenosis. Ost 1st Diag lesion is 40% stenosed. Prox LAD lesion is 40% stenosed with 50% stenosed side branch in Ost 2nd Diag.   SUMMARY Mild to moderate LAD and diagonal disease, but no  significant stenosis. Low to normal LVEDP.  Normal EF by echo.   RECOMMENDATIONS Return to nursing care for monitoring post cath.  Medical management for possible microvascular disease. Recommend Aspirin 31m daily for moderate CAD.   Antimicrobials:  None    Subjective: Abdominal pain persistent. Emesis this morning.  Objective: Vitals:   10/24/18 1000 10/24/18 1030 10/24/18 1100 10/24/18 1123  BP: (!) 98/58 (!) 98/54 (!) 97/52 99/80  Pulse: (!) 103 (!) 105 (!) 105 (!) 107  Resp:    16  Temp:    97.6 F (36.4 C)  TempSrc:    Oral  SpO2:    96%  Weight:    72.3 kg  Height:        Intake/Output Summary (Last 24 hours) at 10/24/2018 1501 Last data filed at 10/24/2018 1300 Gross per 24 hour  Intake 220 ml  Output 2500 ml  Net -2280 ml   Filed Weights   10/23/18 2050 10/24/18 0720 10/24/18 1123  Weight: 72.8 kg 75 kg 72.3 kg    Examination:  General exam: Appears calm and comfortable Respiratory system: Clear to auscultation. Respiratory effort normal. Cardiovascular system: S1 & S2 heard, RRR. No murmurs, rubs, gallops or clicks. Gastrointestinal system: Abdomen is nondistended, soft and mildly diffusely tender. Decreased bowel sounds heard. Central nervous system: Alert and oriented. No focal neurological deficits. Extremities: No edema. No calf tenderness Skin: No cyanosis. No rashes Psychiatry: Judgement and insight appear normal. Mood & affect appropriate.     Data Reviewed: I have personally reviewed following labs and imaging studies  CBC: Recent Labs  Lab 10/21/18 0329 10/22/18 0314 10/23/18 0318 10/23/18 1226 10/24/18 0449  WBC 11.6* 13.0* 13.7* 16.5* 24.1*  HGB 11.1* 10.0* 11.0* 11.2* 10.2*  HCT 36.5* 32.9* 35.6* 35.9* 32.8*  MCV 93.4 94.8 93.9 94.2 93.2  PLT 414* 488* 481* 518* 5664   Basic Metabolic Panel: Recent Labs  Lab 10/19/18 0828 10/21/18 0329 10/23/18 1226 10/24/18 0733  NA 132* 135 132* 131*  K 4.9 4.2 4.4 5.0  CL 94*  95* 92* 92*  CO2 20* _0 GLUCOSE 107* 98 211* 126*  BUN 55* 48* 39* 54*  CREATININE 10.27* 9.12* 9.49* 11.52*  CALCIUM 8.7* 9.1 9.5 9.6  PHOS 6.4*  --  3.7 5.3*   GFR: Estimated Creatinine Clearance: 6.3 mL/min (A) (by C-G formula based on SCr of 11.52 mg/dL (H)). Liver Function Tests: Recent Labs  Lab 10/19/18 0828 10/23/18 1226 10/24/18 0733  ALBUMIN 2.7* 2.8* 2.8*   No results for input(s): LIPASE, AMYLASE in the last 168 hours. No results for input(s): AMMONIA in the last 168 hours. Coagulation Profile: No results for input(s): INR, PROTIME in the last 168 hours. Cardiac Enzymes: Recent Labs  Lab 10/22/18 0932  CKTOTAL 42*   BNP (last 3 results) No results for input(s): PROBNP in the last 8760 hours. HbA1C: No results for input(s): HGBA1C in the last 72 hours. CBG: Recent Labs  Lab 10/23/18 1609 10/23/18 1859 10/23/18 2050 10/24/18 1052 10/24/18 1158  GLUCAP 183* 173* 156* 126* 136*   Lipid Profile: No results for input(s): CHOL, HDL, LDLCALC, TRIG, CHOLHDL, LDLDIRECT in the last 72 hours. Thyroid Function Tests: No results for input(s): TSH, T4TOTAL, FREET4, T3FREE, THYROIDAB in the last 72 hours. Anemia Panel: No results for input(s): VITAMINB12, FOLATE, FERRITIN, TIBC, IRON, RETICCTPCT in the last 72 hours. Sepsis Labs: No results for input(s): PROCALCITON, LATICACIDVEN in the last 168 hours.  Recent Results (from the past 240 hour(s))  MRSA PCR Screening     Status: Abnormal   Collection Time: 10/15/18 12:54 AM  Result Value Ref Range Status   MRSA by PCR POSITIVE (A) NEGATIVE Final    Comment: RESULT CALLED TO, READ BACK BY AND VERIFIED WITH: RN TIM IRBY [id number]  THANEY        The GeneXpert MRSA Assay (FDA approved for NASAL specimens only), is one component of a comprehensive MRSA colonization surveillance program. It is not intended to diagnose MRSA infection nor to guide or monitor treatment for MRSA infections. Performed at  [facility], [place] [address] [place], [place] [address]   Culture, blood (Routine X 2) w Reflex to ID Panel     Status: None   Collection Time: 10/18/18 10:50 AM  Result Value Ref Range Status   Specimen Description BLOOD LEFT ANTECUBITAL  Final   Special Requests   Final    BOTTLES DRAWN AEROBIC AND ANAEROBIC Blood Culture adequate volume   Culture   Final    NO GROWTH 5 DAYS Performed at [facility], [place] [address], [place], [place] [address]    Report Status 10/23/2018 FINAL  Final  Culture, blood (Routine X 2) w Reflex to ID Panel     Status: None   Collection Time: 10/18/18 11:00 AM  Result Value Ref Range Status   Specimen Description BLOOD LEFT FOREARM  Final   Special Requests   Final    BOTTLES DRAWN AEROBIC AND ANAEROBIC Blood Culture adequate volume   Culture   Final    NO GROWTH 5 DAYS Performed at [facility], [place] [address] [place],  [address]    Report Status 10/23/2018 FINAL  Final         Radiology Studies: No results found.      Scheduled Meds: . aspirin EC  81 mg Oral Daily  . atorvastatin  10 mg Oral q1800  . Chlorhexidine Gluconate Cloth  6 each Topical Q0600  . diclofenac sodium  2 g Topical QID  . dicyclomine  20 mg Oral TID AC  . doxercalciferol  3 mcg Intravenous Q T,Th,Sa-HD  . ferric citrate  210 mg Oral TID WC  . heparin injection (subcutaneous)  5,000 Units Subcutaneous Q8H  . insulin aspart  0-9 Units Subcutaneous TID WC  . midodrine  10 mg Oral Q T,Th,Sa-HD  . pantoprazole  40 mg Oral BID AC  . rOPINIRole  0.25 mg Oral QHS  . sodium chloride flush  3 mL Intravenous Q12H   Continuous Infusions: . sodium chloride 10 mL/hr at 10/17/18 1719     LOS: 8 days     Cordelia Poche, MD [facility] 10/24/2018, 3:01 PM  If 7PM-7AM, please contact night-coverage [email or web address]

## 2018-10-24 NOTE — Procedures (Signed)
Patient seen and examined on Hemodialysis. QB 400 mL /min UF goal 2.5L  Tolerating well.   Treatment adjusted as needed.  Madelon Lips MD [facility] pgr [phone] 9:56 AM

## 2018-10-24 NOTE — Progress Notes (Signed)
Pt. Ate breakfast sandwich approximately 5 minutes after pt. Vomit small amount.

## 2018-10-24 NOTE — Progress Notes (Signed)
PT Cancellation Note  Patient Details Name: Jermarcus Mcfadyen MRN: [id number] DOB: May 04, 1961   Cancelled Treatment:    Reason Eval/Treat Not Completed: Patient at procedure or test/unavailable Pt currently in HD. Will follow up as schedule allows.   Leighton Ruff, PT, DPT  Acute Rehabilitation Services  Pager: [phone] Office: [phone]    Rudean Hitt 10/24/2018, 10:23 AM

## 2018-10-24 NOTE — Evaluation (Signed)
Physical Therapy Evaluation Patient Details Name: Fernando Jones MRN: [id number] DOB: 14-May-1961 Today's Date: 10/24/2018   History of Present Illness  Pt is a 57 y/o male admitted secondary to chest pain and abdominal pain. Pt is s/p L heart cath which showed mild to moderate CAD and per notes, to be managed medically. Abdominal pain of unknown etiology, however, per notes, pending GI consult. CT of abdomen negative for acute abnormality. PMH includes HTN, ESRD on HD TTS, tobacco abuse, and DM.   Clinical Impression  Pt admitted secondary to problem above with deficits below. Pt limited in activity tolerance secondary to abdominal pain. Pt initially requiring min A to stand secondary to dizziness, however, after going to get BP machine, witnessed pt performing stand pivot to chair without assist. Pt able to perform another stand pivot transfer back to bed with supervision. Anticipate pt will progress well once abdominal pain improves. Will continue to follow acutely to maximize functional mobility independence and safety.    Follow Up Recommendations Home health PT;Supervision for mobility/OOB    Equipment Recommendations  Rolling walker with 5" wheels    Recommendations for Other Services OT consult     Precautions / Restrictions Precautions Precautions: Fall Restrictions Weight Bearing Restrictions: No      Mobility  Bed Mobility Overal bed mobility: Modified Independent             General bed mobility comments: Increased time, however, no physical assist required.   Transfers Overall transfer level: Needs assistance Equipment used: 1 person hand held assist;None Transfers: Sit to/from [facility] Sit to Stand: Min assist Stand pivot transfers: Supervision       General transfer comment: Initially requiring min A for lift assist and steadying to stand X2. Pt reports some dizziness, so left room to get BP machine. Upon return, saw pt  transferring to chair with supervision. Able to transfer back to bed with supervision as well. Further mobility deferred. BP checked and at 100/63 mmHg  Ambulation/Gait                Stairs            Wheelchair Mobility    Modified Rankin (Stroke Patients Only)       Balance Overall balance assessment: Needs assistance Sitting-balance support: No upper extremity supported;Feet supported Sitting balance-Leahy Scale: Good     Standing balance support: No upper extremity supported;Single extremity supported;During functional activity Standing balance-Leahy Scale: Fair                               Pertinent Vitals/Pain Pain Assessment: Faces Faces Pain Scale: Hurts even more Pain Location: abdomen  Pain Descriptors / Indicators: Grimacing;Guarding Pain Intervention(s): Limited activity within patient's tolerance;Monitored during session;Repositioned    Home Living Family/patient expects to be discharged to:: Private residence Living Arrangements: Non-relatives/Friends Available Help at Discharge: Friend(s);Available PRN/intermittently Type of Home: Apartment Home Access: Level entry     Home Layout: One level Home Equipment: Wheelchair - manual      Prior Function Level of Independence: Independent with assistive device(s)         Comments: Reports he occasionally uses WC on dialysis days, however, reports he is independent with ambulation when he is feeling well.      Hand Dominance   Dominant Hand: Left    Extremity/Trunk Assessment   Upper Extremity Assessment Upper Extremity Assessment: Defer to OT evaluation  Lower Extremity Assessment Lower Extremity Assessment: Generalized weakness    Cervical / Trunk Assessment Cervical / Trunk Assessment: Normal  Communication   Communication: No difficulties  Cognition Arousal/Alertness: Awake/alert Behavior During Therapy: WFL for tasks assessed/performed Overall Cognitive  Status: Within Functional Limits for tasks assessed                                        General Comments      Exercises     Assessment/Plan    PT Assessment Patient needs continued PT services  PT Problem List Decreased strength;Decreased activity tolerance;Decreased mobility;Decreased knowledge of use of DME;Pain       PT Treatment Interventions DME instruction;Gait training;Functional mobility training;Therapeutic activities;Therapeutic exercise;Balance training;Patient/family education    PT Goals (Current goals can be found in the Care Plan section)  Acute Rehab PT Goals Patient Stated Goal: "for my stomach to feel better"  PT Goal Formulation: With patient Time For Goal Achievement: 11/07/18 Potential to Achieve Goals: Fair    Frequency Min 3X/week   Barriers to discharge Decreased caregiver support      Co-evaluation               AM-PAC PT "6 Clicks" Daily Activity  Outcome Measure Difficulty turning over in bed (including adjusting bedclothes, sheets and blankets)?: A Little Difficulty moving from lying on back to sitting on the side of the bed? : A Little Difficulty sitting down on and standing up from a chair with arms (e.g., wheelchair, bedside commode, etc,.)?: Unable Help needed moving to and from a bed to chair (including a wheelchair)?: A Little Help needed walking in hospital room?: A Little Help needed climbing 3-5 steps with a railing? : A Lot 6 Click Score: 15    End of Session Equipment Utilized During Treatment: Gait belt Activity Tolerance: Patient limited by pain Patient left: in bed;with call bell/phone within reach Nurse Communication: Mobility status;Other (comment)(continued abdominal pain ) PT Visit Diagnosis: Other abnormalities of gait and mobility (R26.89);Muscle weakness (generalized) (M62.81);Dizziness and giddiness (R42);Pain Pain - part of body: (abdominal )    Time: 6967-8938 PT Time Calculation (min)  (ACUTE ONLY): 17 min   Charges:   PT Evaluation $PT Eval Moderate Complexity: [place], PT, DPT  Acute Rehabilitation Services  Pager: [phone] Office: [phone]   Rudean Hitt 10/24/2018, 3:44 PM

## 2018-10-25 ENCOUNTER — Inpatient Hospital Stay (HOSPITAL_COMMUNITY): Payer: Self-pay

## 2018-10-25 DIAGNOSIS — I779 Disorder of arteries and arterioles, unspecified: Secondary | ICD-10-CM

## 2018-10-25 DIAGNOSIS — I259 Chronic ischemic heart disease, unspecified: Secondary | ICD-10-CM

## 2018-10-25 LAB — CBC WITH DIFFERENTIAL/PLATELET
ABS IMMATURE GRANULOCYTES: 0.5 10*3/uL — AB (ref 0.00–0.07)
BASOS ABS: 0.2 10*3/uL — AB (ref 0.0–0.1)
BASOS PCT: 1 %
EOS ABS: 0.7 10*3/uL — AB (ref 0.0–0.5)
Eosinophils Relative: 4 %
HEMATOCRIT: 36.2 % — AB (ref 39.0–52.0)
HEMOGLOBIN: 11 g/dL — AB (ref 13.0–17.0)
Lymphocytes Relative: 22 %
Lymphs Abs: 3.7 10*3/uL (ref 0.7–4.0)
MCH: 28.6 pg (ref 26.0–34.0)
MCHC: 30.4 g/dL (ref 30.0–36.0)
MCV: 94.3 fL (ref 80.0–100.0)
METAMYELOCYTES PCT: 1 %
MONOS PCT: 5 %
Monocytes Absolute: 0.8 10*3/uL (ref 0.1–1.0)
Myelocytes: 1 %
NEUTROS ABS: 11 10*3/uL — AB (ref 1.7–7.7)
NEUTROS PCT: 65 %
NRBC: 0 % (ref 0.0–0.2)
NRBC: 0 /100{WBCs}
Platelets: 654 10*3/uL — ABNORMAL HIGH (ref 150–400)
Promyelocytes Relative: 1 %
RBC: 3.84 MIL/uL — AB (ref 4.22–5.81)
RDW: 15.3 % (ref 11.5–15.5)
WBC: 16.9 10*3/uL — ABNORMAL HIGH (ref 4.0–10.5)

## 2018-10-25 LAB — COMPREHENSIVE METABOLIC PANEL
ALBUMIN: 2.7 g/dL — AB (ref 3.5–5.0)
ALT: 58 U/L — ABNORMAL HIGH (ref 0–44)
ANION GAP: 13 (ref 5–15)
AST: 25 U/L (ref 15–41)
Alkaline Phosphatase: 100 U/L (ref 38–126)
BILIRUBIN TOTAL: 0.3 mg/dL (ref 0.3–1.2)
BUN: 43 mg/dL — ABNORMAL HIGH (ref 6–20)
CALCIUM: 9.4 mg/dL (ref 8.9–10.3)
CO2: 26 mmol/L (ref 22–32)
Chloride: 95 mmol/L — ABNORMAL LOW (ref 98–111)
Creatinine, Ser: 7.92 mg/dL — ABNORMAL HIGH (ref 0.61–1.24)
GFR calc non Af Amer: 7 mL/min — ABNORMAL LOW (ref >60)
GFR, EST AFRICAN AMERICAN: 8 mL/min — AB (ref >60)
Glucose, Bld: 150 mg/dL — ABNORMAL HIGH (ref 70–99)
POTASSIUM: 4.1 mmol/L (ref 3.5–5.1)
SODIUM: 134 mmol/L — AB (ref 135–145)
TOTAL PROTEIN: 6.7 g/dL (ref 6.5–8.1)

## 2018-10-25 LAB — GLUCOSE, CAPILLARY
GLUCOSE-CAPILLARY: 166 mg/dL — AB (ref 70–99)
Glucose-Capillary: 133 mg/dL — ABNORMAL HIGH (ref 70–99)
Glucose-Capillary: 135 mg/dL — ABNORMAL HIGH (ref 70–99)

## 2018-10-25 LAB — ANA W/REFLEX IF POSITIVE: ANA: NEGATIVE

## 2018-10-25 MED ORDER — DICLOFENAC SODIUM 1 % TD GEL: 2.0000 g | Freq: Four times a day (QID) | TRANSDERMAL | 3 refills | Status: DC

## 2018-10-25 MED ORDER — ATORVASTATIN CALCIUM 10 MG PO TABS: 10.0000 mg | ORAL_TABLET | Freq: Every day | ORAL | 3 refills | Status: DC

## 2018-10-25 MED ORDER — ONDANSETRON HCL 4 MG PO TABS: 4.0000 mg | ORAL_TABLET | Freq: Four times a day (QID) | ORAL | 0 refills | Status: DC | PRN

## 2018-10-25 MED ORDER — PANTOPRAZOLE SODIUM 40 MG PO TBEC: 40.0000 mg | DELAYED_RELEASE_TABLET | Freq: Two times a day (BID) | ORAL | 2 refills | Status: DC

## 2018-10-25 MED ORDER — SORBITOL 70 % SOLN
30.0000 mL | Freq: Three times a day (TID) | Status: DC | PRN
  Administered 2018-10-25: 30 mL via ORAL
  Filled 2018-10-25: qty 30

## 2018-10-25 MED ORDER — DICYCLOMINE HCL 20 MG PO TABS: 20.0000 mg | ORAL_TABLET | Freq: Three times a day (TID) | ORAL | 3 refills | Status: DC

## 2018-10-25 MED FILL — PANTOPRAZOLE SOD DR 40 MG T: 40 | 30 days supply | Qty: 60 | Fill #0

## 2018-10-25 MED FILL — DICLOFENAC SODIUM 1% GEL: 1 | 12 days supply | Qty: 100 | Fill #0

## 2018-10-25 MED FILL — DICYCLOMINE 20 MG TABLET: 20 | 30 days supply | Qty: 90 | Fill #0

## 2018-10-25 MED FILL — ONDANSETRON HCL 4 MG TABLET: 4 | 5 days supply | Qty: 20 | Fill #0

## 2018-10-25 MED FILL — ATORVASTATIN 10 MG TABLET: 10 | 30 days supply | Qty: 30 | Fill #0

## 2018-10-25 NOTE — Progress Notes (Signed)
Subjective:  Eating lunch  Co some constipation  , tolerated hd yest   Objective Vital signs in last 24 hours: Vitals:   10/24/18 1623 10/24/18 2034 10/25/18 0425 10/25/18 0756  BP: (!) 109/58 (!) 101/48 111/75 120/65  Pulse: 75 72 97 98  Resp: _0 (!) 21  Temp: 98.1 F (36.7 C) 98.4 F (36.9 C) 98.6 F (37 C) 98 F (36.7 C)  TempSrc: Oral Oral Oral Oral  SpO2: 92% 93% 100% 100%  Weight:      Height:       Weight change: 2.2 kg  Physical Exam: General: alert nad  Eating lucnch Heart: RRR 1/6 sem  Lungs: CTA Abdomen: BS pos., tender diffusely , min distention  Extremities: No pedal edema  Dialysis Access: +   Dialysis: [facility]h 2/2.25 74kg P2 RFA AVF Hep 2000 Hectorol 45mg IV/HD  Last Mircera 75 mcg 10/12/18 with last hgb11.6 10/12/18 and no ESA since 09/07/18   Problem/Plan: 1. Abd pain = wu  Per admit  Noted  CTA, CT renal stone, abd x-ray, RUQ [place]. Voltaren gel+ Bentyl seems to be helping periodically.  ESR and CRP are markedly elevated / ANA = neg  2. ESRD - HD MWF  3. Chest pain - sp LHC w/o sig CAD 4. Hypotension - chronic issue, patient on midodrine pre HD , all his HTN meds were stopped at OP HD recently due to Low bp's. BP's are normal here. At dry wt.  5. Volume - 1 kg under, stable on exam 6. Anemia - hgb 11.0 No esa as op  7. Metabolic bone disease -Auryxia as Phos bind and on hectorol  8. DM type 2 per admit 9. Dispo: pending improvement in abd pain, from renal perspective stable renal issues   DErnest Haber PA-C [facility] [phone]/20/2019,12:28 PM  LOS: 9 days   Labs: Basic Metabolic Panel: Recent Labs  Lab 10/19/18 0828  10/23/18 1226 10/24/18 0733 10/25/18 0925  NA 132*   < > 132* 131* 134*  K 4.9   < > 4.4 5.0 4.1  CL 94*   < > 92* 92* 95*  CO2 20*   < > _1 GLUCOSE 107*   < > 211* 126* 150*  BUN 55*   < > 39* 54* 43*  CREATININE 10.27*   < > 9.49* 11.52* 7.92*  CALCIUM  8.7*   < > 9.5 9.6 9.4  PHOS 6.4*  --  3.7 5.3*  --    < > = values in this interval not displayed.   Liver Function Tests: Recent Labs  Lab 10/23/18 1226 10/24/18 0733 10/25/18 0925  AST  --   --  25  ALT  --   --  58*  ALKPHOS  --   --  100  BILITOT  --   --  0.3  PROT  --   --  6.7  ALBUMIN 2.8* 2.8* 2.7*   No results for input(s): LIPASE, AMYLASE in the last 168 hours. No results for input(s): AMMONIA in the last 168 hours. CBC: Recent Labs  Lab 10/22/18 0314 10/23/18 0318 10/23/18 1226 10/24/18 0449 10/25/18 0925  WBC 13.0* 13.7* 16.5* 24.1* 16.9*  NEUTROABS  --   --   --   --  11.0*  HGB 10.0* 11.0* 11.2* 10.2* 11.0*  HCT 32.9* 35.6* 35.9* 32.8* 36.2*  MCV 94.8 93.9 94.2 93.2 94.3  PLT 488* 481* 518* 575* 654*   Cardiac  Enzymes: Recent Labs  Lab 10/22/18 0932  CKTOTAL 42*   CBG: Recent Labs  Lab 10/24/18 1158 10/24/18 1622 10/24/18 2034 10/25/18 0755 10/25/18 1106  GLUCAP 136* 170* 160* 133* 166*    Studies/Results: Ct Cervical Spine Wo Contrast  Result Date: 10/25/2018 CLINICAL DATA:  Cervical radiculopathy. Right shoulder and arm pain. EXAM: CT CERVICAL SPINE WITHOUT CONTRAST TECHNIQUE: Multidetector CT imaging of the cervical spine was performed without intravenous contrast. Multiplanar CT image reconstructions were also generated. COMPARISON:  None. FINDINGS: Alignment: Normal Skull base and vertebrae: Negative for fracture or mass lesion. Soft tissues and spinal canal: Negative for soft tissue mass. Atherosclerotic calcification aortic arch proximal great vessels and carotid bifurcation bilaterally. Disc levels:  C2-3: Negative C3-4: Minimal central disc protrusion without stenosis C4-5: Minimal central disc protrusion. Mild disc degeneration without stenosis C5-6: Mild disc degeneration and mild facet degeneration. Negative for stenosis C6-7: Moderate disc degeneration and spondylosis. Mild foraminal stenosis bilaterally due to spurring. Spinal canal  adequate in size C7-T1: Negative Upper chest: Negative Other: None IMPRESSION: Mild degenerative changes cervical spine most prominent at C6-7. Mild foraminal narrowing bilaterally C6-7 without definite neural impingement. Atherosclerotic disease. Electronically Signed   By: Franchot Gallo M.D.   On: 10/25/2018 09:27   Medications: . sodium chloride 10 mL/hr at 10/17/18 1719   . aspirin EC  81 mg Oral Daily  . atorvastatin  10 mg Oral q1800  . Chlorhexidine Gluconate Cloth  6 each Topical Q0600  . diclofenac sodium  2 g Topical QID  . dicyclomine  20 mg Oral TID AC  . doxercalciferol  3 mcg Intravenous Q T,Th,Sa-HD  . ferric citrate  210 mg Oral TID WC  . heparin injection (subcutaneous)  5,000 Units Subcutaneous Q8H  . insulin aspart  0-9 Units Subcutaneous TID WC  . midodrine  10 mg Oral Q T,Th,Sa-HD  . pantoprazole  40 mg Oral BID AC  . rOPINIRole  0.25 mg Oral QHS  . sodium chloride flush  3 mL Intravenous Q12H

## 2018-10-25 NOTE — Care Management Note (Addendum)
Case Management Note Previous note created by Jonnie Finner  Patient Details  Name: Fernando Jones MRN: [id number] Date of Birth: 1961-08-01  Subjective/Objective:  Chest pain, abd pain                  Action/Plan: NCM spoke to pt and offered HH. Pt declines HH at this time. States he has RW at home. He pays for medications out of pocket. Scheduled appt for [facility] for 12/9 at 3:10 pm. Provided pt with brochure and info for [facility]. Pt used [facility] in 12/2017. Pt was a no show at [facility] in 12/2017. Explained to pt the importance of keeping appt or rescheduling as needed.   Expected Discharge Date:  10/25/18               Expected Discharge Plan:  [place]  In-House Referral:  NA  Discharge planning Services  CM Consult  Post Acute Care Choice:  Home Health Choice offered to:  NA  DME Arranged:  N/A DME Agency:  NA  HH Arranged:  Patient Refused [facility] Agency:  NA  Status of Service:  Completed, signed off  If discussed at [place] of Stay Meetings, dates discussed:    Additional Comments: 10/25/2018 CM spoke with pt prior to discharge - pt continues to decline HH as recommended.  Pt informed CM that he can not afford to pay the cost for discharge medications - pt informed CM that he may be able to "scrape up the money next week". Pt told CM that his family/friends are unable to help him with medication cost.   CM acknowledges that pt utilized  [facility] during this calendar year however pt will have to go without medications if [facility] is not available at this time.  CM was able to successfully [facility] pt - pt also unable to pay copay - override was submitted for copays.  CM contacted [facility] - referral accepted and medications will be delivered to bedside before pt leaves [facility].  Bedside will take written prescriptions to [place].   Maryclare Labrador, RN 10/25/2018, 11:46 AM

## 2018-10-25 NOTE — Progress Notes (Signed)
Physical Therapy Treatment Patient Details Name: Fernando Jones MRN: [id number] DOB: 1961/07/17 Today's Date: 10/25/2018    History of Present Illness Pt is a 57 y/o male admitted secondary to chest pain and abdominal pain. Pt is s/p L heart cath which showed mild to moderate CAD and per notes, to be managed medically. Abdominal pain of unknown etiology, however, per notes, pending GI consult. CT of abdomen negative for acute abnormality. PMH includes HTN, ESRD on HD TTS, tobacco abuse, and DM.     PT Comments    Pt with continued severe abdominal pain which limited session and pt's ability to perform functional mobility. He was not able to tolerate stair training this session due to abdominal pain. Unclear if the patient has a walker at home already - if he does not, will need one prior to d/c. Would like to see him functioning a little more independently prior to d/c, and may benefit from additional time in the hospital. Will continue to follow.   Follow Up Recommendations  Home health PT;Supervision for mobility/OOB     Equipment Recommendations  Rolling walker with 5" wheels    Recommendations for Other Services OT consult     Precautions / Restrictions Precautions Precautions: Fall Restrictions Weight Bearing Restrictions: No    Mobility  Bed Mobility Overal bed mobility: Needs Assistance Bed Mobility: Supine to Sit     Supine to sit: Modified independent (Device/Increase time) Sit to supine: Min assist(ASSIST TO BRING LEGS INTO BED)   General bed mobility comments: Increased time due to pain, however no assist required.   Transfers Overall transfer level: Needs assistance Equipment used: 1 person hand held assist Transfers: Sit to/from Stand Sit to Stand: Min assist Stand pivot transfers: Min guard       General transfer comment: Pt reaching for therapist for support. Pt Was able to power-up to full stand with min assist for balance support.    Ambulation/Gait Ambulation/Gait assistance: Min assist Gait Distance (Feet): 175 Feet Assistive device: 1 person hand held assist Gait Pattern/deviations: Step-through pattern;Decreased stride length;Trunk flexed Gait velocity: Decreased Gait velocity interpretation: <1.31 ft/sec, indicative of household ambulator General Gait Details: Pt holding abdomen throughout gait training due to pain. Heavy min assist provided for balance support. Pt required several standing rest breaks due to pain throughout gait training.    Stairs             Wheelchair Mobility    Modified Rankin (Stroke Patients Only)       Balance Overall balance assessment: Needs assistance Sitting-balance support: No upper extremity supported;Feet supported Sitting balance-Leahy Scale: Good     Standing balance support: No upper extremity supported;Single extremity supported;During functional activity Standing balance-Leahy Scale: Fair                              Cognition Arousal/Alertness: Awake/alert Behavior During Therapy: WFL for tasks assessed/performed Overall Cognitive Status: Within Functional Limits for tasks assessed                                        Exercises      General Comments        Pertinent Vitals/Pain Pain Assessment: Faces Pain Score: 3  Faces Pain Scale: Hurts whole lot Pain Location: abdomen  Pain Descriptors / Indicators: Aching;Grimacing;Guarding Pain Intervention(s): Limited activity within patient's tolerance;Monitored  during session;Repositioned;Patient requesting pain meds-RN notified    Home Living Family/patient expects to be discharged to:: Private residence Living Arrangements: Non-relatives/Friends Available Help at Discharge: Friend(s);Available PRN/intermittently Type of Home: Apartment Home Access: Level entry   Home Layout: One level Home Equipment: Wheelchair - manual;Grab bars - toilet;Grab bars -  tub/shower      Prior Function Level of Independence: Independent with assistive device(s)      Comments: patient states he usually walks    PT Goals (current goals can now be found in the care plan section) Acute Rehab PT Goals Patient Stated Goal: Decrease pain PT Goal Formulation: With patient Time For Goal Achievement: 11/07/18 Potential to Achieve Goals: Fair Progress towards PT goals: Progressing toward goals    Frequency    Min 3X/week      PT Plan Current plan remains appropriate    Co-evaluation              AM-PAC PT "6 Clicks" Daily Activity  Outcome Measure  Difficulty turning over in bed (including adjusting bedclothes, sheets and blankets)?: A Little Difficulty moving from lying on back to sitting on the side of the bed? : A Little Difficulty sitting down on and standing up from a chair with arms (e.g., wheelchair, bedside commode, etc,.)?: Unable Help needed moving to and from a bed to chair (including a wheelchair)?: A Little Help needed walking in hospital room?: A Little Help needed climbing 3-5 steps with a railing? : A Lot 6 Click Score: 15    End of Session Equipment Utilized During Treatment: Gait belt Activity Tolerance: Patient limited by pain Patient left: in bed;with call bell/phone within reach Nurse Communication: Mobility status;Patient requests pain meds PT Visit Diagnosis: Other abnormalities of gait and mobility (R26.89);Muscle weakness (generalized) (M62.81);Dizziness and giddiness (R42);Pain Pain - part of body: (abdominal )     Time: 5110-2111 PT Time Calculation (min) (ACUTE ONLY): 18 min  Charges:  $Gait Training: 8-22 mins                     Rolinda Roan, PT, DPT Acute Rehabilitation Services Pager: [phone] Office: [phone]    Thelma Comp 10/25/2018, 12:14 PM

## 2018-10-25 NOTE — Discharge Planning (Signed)
Pt reinstated in [facility] program to assist pt with obtaining medications.  Override of co-pay deemed necessary as pt is in [place] pending status and can not afford Rx co-pay.  No further CM needs identified at this time.

## 2018-10-25 NOTE — Discharge Summary (Signed)
Physician Discharge Summary   Patient ID: Fernando Jones MRN: [id number] DOB/AGE: June 29, 1961 57 y.o.  Admit date: 10/14/2018 Discharge date: 10/25/2018  Primary Care Physician:  Patient, No Pcp Per   Recommendations for Outpatient Follow-up:  1. PCP appointment scheduled on 11/13/2018 2. Strongly recommend rheumatology outpatient referral  Home Health:  Home health PT Equipment/Devices: DME rolling walker  Discharge Condition: stable  CODE STATUS: FULL  Diet recommendation: Renal diet   Discharge Diagnoses:   Right upper quadrant abdominal pain, unclear etiology  . DM (diabetes mellitus), type 2 with renal complications ([place]) . TOBACCO ABUSE . Essential hypertension . ESRD (end stage renal disease) ([place]) . Atypical chest pain   Consults: Gastroenterology Nephrology    Allergies:  No Known Allergies   DISCHARGE MEDICATIONS: Allergies as of 10/25/2018   No Known Allergies     Medication List    STOP taking these medications   carvedilol 12.5 MG tablet Commonly known as:  COREG   hydrALAZINE 25 MG tablet Commonly known as:  APRESOLINE   losartan 50 MG tablet Commonly known as:  COZAAR   mupirocin ointment 2 % Commonly known as:  BACTROBAN     TAKE these medications   aspirin 81 MG EC tablet Take 1 tablet (81 mg total) by mouth daily.   atorvastatin 10 MG tablet Commonly known as:  LIPITOR Take 1 tablet (10 mg total) by mouth at bedtime.   camphor-menthol lotion Commonly known as:  SARNA Apply 1 application topically every 8 (eight) hours as needed for itching.   Darbepoetin Alfa 200 MCG/0.4ML Sosy injection Commonly known as:  ARANESP 236mg to be given with dialysis every 14 days   diclofenac sodium 1 % Gel Commonly known as:  VOLTAREN Apply 2 g topically 4 (four) times daily. Apply to abdomen   dicyclomine 20 MG tablet Commonly known as:  BENTYL Take 1 tablet (20 mg total) by mouth 3 (three) times daily before meals.   feeding  supplement (NEPRO CARB STEADY) Liqd Take 237 mLs by mouth 2 (two) times daily between meals.   ferric citrate 1 GM 210 MG(Fe) tablet Commonly known as:  AURYXIA Take 1 tablet (210 mg total) by mouth 3 (three) times daily with meals.   midodrine 10 MG tablet Commonly known as:  PROAMATINE Take 10 mg by mouth See admin instructions. Take 1 tablet (10 mg totally) by mouth three times a week with dialysis   ondansetron 4 MG tablet Commonly known as:  ZOFRAN Take 1 tablet (4 mg total) by mouth every 6 (six) hours as needed for nausea.   oxyCODONE 5 MG immediate release tablet Commonly known as:  Oxy IR/ROXICODONE Take 1 tablet (5 mg total) by mouth every 4 (four) hours as needed for severe pain.   pantoprazole 40 MG tablet Commonly known as:  PROTONIX Take 1 tablet (40 mg total) by mouth 2 (two) times daily before a meal.   ranitidine 150 MG tablet Commonly known as:  ZANTAC Take 150 mg by mouth daily.   rOPINIRole 0.25 MG tablet Commonly known as:  REQUIP Take 0.25-0.5 mg by mouth at bedtime.   tamsulosin 0.4 MG Caps capsule Commonly known as:  FLOMAX Take 1 capsule (0.4 mg total) by mouth daily.            Durable Medical Equipment  (From admission, onward)         Start     Ordered   10/24/18 1610  For home use only DME Walker rolling  Once    Question:  Patient needs a walker to treat with the following condition  Answer:  Weakness   10/24/18 1611           Brief H and P: For complete details please refer to admission H and P, but in brief Fernando Jones is a 57 y.o. malewitha hxof ESRD on HD (T/Th/Sat),DM, HTN, GERD, tobacco abuse,andHLD. He presented with left sided chest pain concerning for ACS. ACS ruled out.  Hospital Course:   Atypical chest pain Patient underwent left cardiac catheterization on 11/12 which showed mild to moderate disease LAD/diagonal, chest pain has resolved Cardiology recommended medical management  Right upper  quadrant abdominal pain of unclear etiology -Possibly gastroparesis versus constipation versus inflammatory process -Patient has significant amount of work-up including recent CT angiogram of the abdomen and pelvis which showed atherosclerosis of thoracic and abdominal aorta without any aneurysm or dissection, 7 mm nodule in the right lung base, no significant mesenteric artery stenosis, moderate diffuse atheromatous disease in both renal arteries.  -CT renal stone study on 11/15 showed no acute intra-abdominal intrapelvic abnormalities. -Patient also reported neck pain which showed mild degenerative changes at C6-C7, no definitive neural impingement -Ultrasound significant for some bowel gas, FOBT negative, abdominal x-ray negative.  ESR CRP elevated however he has ESRD hence somewhat inconclusive.  ANA negative. -GI was consulted, seen by Dr. Paulita Fujita, had a benign abdominal exam and did not feel patient had any intrinsic GI tract process.  Patient reports that pain is improved with Bentyl and Voltaren gel -Recommended rheumatology opinion.  No inpatient rheumatology available, patient recommended outpatient rheumatology referral through PCP, I have also sent rheumatology referral via epic.  Constipation, diarrhea?  IBS -Today complaining of constipation, was given sorbitol   ESRD on hemodialysis TTS Patient underwent hemodialysis per his schedule, nephrology was consulted.  Essential hypertension -BP was on softer side, hence losartan, beta-blocker, hydralazine held    Lung nodule Incidental finding. Recommendations for repeat non-contrast CT as an outpatient in 6-12 months    Day of Discharge S: Vague complaints including neck pain right arm pain, abdominal pain, constipation.  Tolerating diet  BP 120/65 (BP Location: Left Arm)   Pulse 98   Temp 98 F (36.7 C) (Oral)   Resp (!) 21   Ht _0  (1.676 m)   Wt 72.3 kg   SpO2 100%   BMI 25.73 kg/m   Physical Exam: General:  Alert and awake oriented x3 not in any acute distress. HEENT: anicteric sclera, pupils reactive to light and accommodation CVS: S1-S2 clear no murmur rubs or gallops Chest: clear to auscultation bilaterally, no wheezing rales or rhonchi Abdomen: soft mild generalized tenderness, no rebound or guarding, nondistended, normal bowel sounds Extremities: no cyanosis, clubbing or edema noted bilaterally Neuro: Cranial nerves II-XII intact, no focal neurological deficits   The results of significant diagnostics from this hospitalization (including imaging, microbiology, ancillary and laboratory) are listed below for reference.      Procedures/Studies:  Dg Chest 2 View  Result Date: 10/14/2018 CLINICAL DATA:  Sudden onset chest pain and shortness of breath, pain radiating to neck. EXAM: CHEST - 2 VIEW COMPARISON:  Chest x-ray dated 06/30/2018. FINDINGS: Heart size and mediastinal contours are within normal limits. Atherosclerotic changes noted at the aortic arch. Lungs are clear. No pleural effusion or pneumothorax seen. Osseous structures about the chest are unremarkable. IMPRESSION: No active cardiopulmonary disease. No evidence of pneumonia or pulmonary edema. Electronically Signed   By: Cherlynn Kaiser  Enriqueta Shutter M.D.   On: 10/14/2018 19:06   Ct Cervical Spine Wo Contrast  Result Date: 10/25/2018 CLINICAL DATA:  Cervical radiculopathy. Right shoulder and arm pain. EXAM: CT CERVICAL SPINE WITHOUT CONTRAST TECHNIQUE: Multidetector CT imaging of the cervical spine was performed without intravenous contrast. Multiplanar CT image reconstructions were also generated. COMPARISON:  None. FINDINGS: Alignment: Normal Skull base and vertebrae: Negative for fracture or mass lesion. Soft tissues and spinal canal: Negative for soft tissue mass. Atherosclerotic calcification aortic arch proximal great vessels and carotid bifurcation bilaterally. Disc levels:  C2-3: Negative C3-4: Minimal central disc protrusion without  stenosis C4-5: Minimal central disc protrusion. Mild disc degeneration without stenosis C5-6: Mild disc degeneration and mild facet degeneration. Negative for stenosis C6-7: Moderate disc degeneration and spondylosis. Mild foraminal stenosis bilaterally due to spurring. Spinal canal adequate in size C7-T1: Negative Upper chest: Negative Other: None IMPRESSION: Mild degenerative changes cervical spine most prominent at C6-7. Mild foraminal narrowing bilaterally C6-7 without definite neural impingement. Atherosclerotic disease. Electronically Signed   By: Franchot Gallo M.D.   On: 10/25/2018 09:27   Dg Abd Portable 1v  Result Date: 10/21/2018 CLINICAL DATA:  Decreased bowel sounds. EXAM: PORTABLE ABDOMEN - 1 VIEW COMPARISON:  10/18/2018 and abdomen and pelvis CT dated 10/20/2018. FINDINGS: Normal bowel gas pattern. Only a small amount of stool in the colon. Unremarkable bones. Atheromatous arterial calcifications. IMPRESSION: No acute abnormality. Electronically Signed   By: Claudie Revering M.D.   On: 10/21/2018 14:30   Dg Abd Portable 1v  Result Date: 10/18/2018 CLINICAL DATA:  Acute onset of generalized abdominal pain. Constipation. EXAM: PORTABLE ABDOMEN - 1 VIEW COMPARISON:  CT of the abdomen and pelvis performed 10/15/2018 FINDINGS: The visualized bowel gas pattern is unremarkable. Scattered air and stool filled loops of colon are seen; no abnormal dilatation of small bowel loops is seen to suggest small bowel obstruction. No free intra-abdominal air is identified, though evaluation for free air is limited on a single supine view. The visualized osseous structures are within normal limits; the sacroiliac joints are unremarkable in appearance. IMPRESSION: Unremarkable bowel gas pattern; no free intra-abdominal air seen. Small amount of stool noted in the colon. Electronically Signed   By: Garald Balding M.D.   On: 10/18/2018 21:28   Ct Angio Chest Aorta W/cm &/or Wo/cm  Result Date: 10/15/2018 CLINICAL  DATA:  Chest and abdominal pain. EXAM: CT ANGIOGRAPHY CHEST, ABDOMEN AND PELVIS TECHNIQUE: Multidetector CT imaging through the chest, abdomen and pelvis was performed using the standard protocol during bolus administration of intravenous contrast. Multiplanar reconstructed images and MIPs were obtained and reviewed to evaluate the vascular anatomy. CONTRAST:  154m ISOVUE-370 IOPAMIDOL (ISOVUE-370) INJECTION 76% COMPARISON:  CT scan of December 06, 2017. FINDINGS: CTA CHEST FINDINGS Cardiovascular: Atherosclerosis of thoracic aorta is noted without aneurysm or dissection. Normal cardiac size. No pericardial effusion. Coronary artery calcifications are noted. Mediastinum/Nodes: No enlarged mediastinal, hilar, or axillary lymph nodes. Thyroid gland, trachea, and esophagus demonstrate no significant findings. Lungs/Pleura: No pneumothorax or pleural effusion is noted. Left lung is clear. 7 mm nodule is noted in right lung base best seen on image number 116 of series 6. Musculoskeletal: No chest wall abnormality. No acute or significant osseous findings. Review of the MIP images confirms the above findings. CTA ABDOMEN AND PELVIS FINDINGS VASCULAR Aorta: Atherosclerosis of abdominal aorta is noted without aneurysm or dissection. Celiac: Patent without evidence of aneurysm, dissection, vasculitis or significant stenosis. SMA: Patent without evidence of aneurysm, dissection, vasculitis or significant stenosis.  Renals: Moderate to severe atheromatous disease is seen diffusely involving both renal arteries. IMA: Patent without evidence of aneurysm, dissection, vasculitis or significant stenosis. Inflow: Moderate eccentric stenosis is seen involving the proximal left common iliac artery. Multifocal moderate stenoses are noted throughout both external iliac arteries. Veins: No obvious venous abnormality within the limitations of this arterial phase study. Review of the MIP images confirms the above findings. NON-VASCULAR  Hepatobiliary: No focal liver abnormality is seen. No gallstones, gallbladder wall thickening, or biliary dilatation. Pancreas: Unremarkable. No pancreatic ductal dilatation or surrounding inflammatory changes. Spleen: Normal in size without focal abnormality. Adrenals/Urinary Tract: Adrenal glands appear normal. Bilateral renal atrophy is noted consistent with history of end-stage renal disease. No hydronephrosis or renal obstruction is noted. Urinary bladder is decompressed. Stomach/Bowel: The stomach appears normal. There is no evidence of bowel obstruction or inflammation. Status post appendectomy. Stool is noted throughout the colon. Lymphatic: No significant adenopathy is noted. Reproductive: Prostate is unremarkable. Other: No abdominal wall hernia or abnormality. No abdominopelvic ascites. Musculoskeletal: No acute or significant osseous findings. Review of the MIP images confirms the above findings. IMPRESSION: Atherosclerosis of thoracic and abdominal aorta is noted without aneurysm or dissection. Coronary artery calcifications are noted suggesting coronary artery disease. 7 mm nodule is noted in right lung base. Non-contrast chest CT at 6-12 months is recommended. If the nodule is stable at time of repeat CT, then future CT at 18-24 months (from today's scan) is considered optional for low-risk patients, but is recommended for high-risk patients. This recommendation follows the consensus statement: Guidelines for Management of Incidental Pulmonary Nodules Detected on CT Images: From the Fleischner Society 2017; Radiology 2017; 284:228-243. No evidence of significant mesenteric artery stenosis. Moderate diffuse atheromatous disease is seen involving both renal arteries, with bilateral renal atrophy consistent with end-stage renal disease. Moderate eccentric stenosis is seen involving the proximal left common iliac artery. Multifocal moderate stenoses are noted throughout both external iliac arteries.  Electronically Signed   By: Marijo Conception, M.D.   On: 10/15/2018 20:08   Ct Renal Stone Study  Result Date: 10/20/2018 CLINICAL DATA:  RIGHT flank pain, weakness, suspected stone disease, history hyperlipidemia, end-stage renal disease, nephrolithiasis, hypertension, type II diabetes mellitus EXAM: CT ABDOMEN AND PELVIS WITHOUT CONTRAST TECHNIQUE: Multidetector CT imaging of the abdomen and pelvis was performed following the standard protocol without IV contrast. Sagittal and coronal MPR images reconstructed from axial data set. No oral contrast administered for this indication. Motion artifacts at mid abdomen, for which repeat imaging was performed. COMPARISON:  10/15/2018 FINDINGS: Lower chest: Lung bases clear Hepatobiliary: Dependent density within gallbladder question vicarious excretion of previously administered contrast from recent CT, not present on prior recent exam. Gallbladder and liver normal appearance Pancreas: Normal appearance Spleen: Normal appearance Adrenals/Urinary Tract: Adrenal glands normal appearance. Numerous calcifications in the kidneys, majority appear to be renovascular in origin though tiny calculi are not excluded. No hydronephrosis or renal mass. No ureteral calcification or dilatation. Excreted contrast material within urinary bladder. Bladder wall appears thickened but bladder is significantly underdistended. Stomach/Bowel: Appendix surgically absent by history. Question medication tablets at gastric antrum. Stomach and bowel loops otherwise normal appearance. Vascular/Lymphatic: Extensive atherosclerotic calcifications aorta, iliac arteries, visceral arteries, coronary arteries. Aorta normal caliber. No adenopathy. Reproductive: Prostatic enlargement, gland 5.3 x 3.1 x 3.5 cm Other: No free air or free fluid. No acute inflammatory process. BILATERAL inguinal hernias containing fat. Musculoskeletal: Stranding at RIGHT inguinal region, per LEFT front medical record had recent  heart  catheterization on 10/17/2018 consistent with access site. No focal hematoma. Bones appear demineralized. Mild degenerative disc disease changes L5-S1. IMPRESSION: No acute intra-abdominal intrapelvic abnormalities. BILATERAL inguinal hernias containing fat. Extensive atherosclerotic disease including coronary arteries. Prostatic enlargement. Stranding at RIGHT inguinal region from recent heart catheterization. Electronically Signed   By: Lavonia Dana M.D.   On: 10/20/2018 11:18   Ct Angio Abd/pel W/ And/or W/o  Result Date: 10/15/2018 CLINICAL DATA:  Chest and abdominal pain. EXAM: CT ANGIOGRAPHY CHEST, ABDOMEN AND PELVIS TECHNIQUE: Multidetector CT imaging through the chest, abdomen and pelvis was performed using the standard protocol during bolus administration of intravenous contrast. Multiplanar reconstructed images and MIPs were obtained and reviewed to evaluate the vascular anatomy. CONTRAST:  116m ISOVUE-370 IOPAMIDOL (ISOVUE-370) INJECTION 76% COMPARISON:  CT scan of December 06, 2017. FINDINGS: CTA CHEST FINDINGS Cardiovascular: Atherosclerosis of thoracic aorta is noted without aneurysm or dissection. Normal cardiac size. No pericardial effusion. Coronary artery calcifications are noted. Mediastinum/Nodes: No enlarged mediastinal, hilar, or axillary lymph nodes. Thyroid gland, trachea, and esophagus demonstrate no significant findings. Lungs/Pleura: No pneumothorax or pleural effusion is noted. Left lung is clear. 7 mm nodule is noted in right lung base best seen on image number 116 of series 6. Musculoskeletal: No chest wall abnormality. No acute or significant osseous findings. Review of the MIP images confirms the above findings. CTA ABDOMEN AND PELVIS FINDINGS VASCULAR Aorta: Atherosclerosis of abdominal aorta is noted without aneurysm or dissection. Celiac: Patent without evidence of aneurysm, dissection, vasculitis or significant stenosis. SMA: Patent without evidence of aneurysm,  dissection, vasculitis or significant stenosis. Renals: Moderate to severe atheromatous disease is seen diffusely involving both renal arteries. IMA: Patent without evidence of aneurysm, dissection, vasculitis or significant stenosis. Inflow: Moderate eccentric stenosis is seen involving the proximal left common iliac artery. Multifocal moderate stenoses are noted throughout both external iliac arteries. Veins: No obvious venous abnormality within the limitations of this arterial phase study. Review of the MIP images confirms the above findings. NON-VASCULAR Hepatobiliary: No focal liver abnormality is seen. No gallstones, gallbladder wall thickening, or biliary dilatation. Pancreas: Unremarkable. No pancreatic ductal dilatation or surrounding inflammatory changes. Spleen: Normal in size without focal abnormality. Adrenals/Urinary Tract: Adrenal glands appear normal. Bilateral renal atrophy is noted consistent with history of end-stage renal disease. No hydronephrosis or renal obstruction is noted. Urinary bladder is decompressed. Stomach/Bowel: The stomach appears normal. There is no evidence of bowel obstruction or inflammation. Status post appendectomy. Stool is noted throughout the colon. Lymphatic: No significant adenopathy is noted. Reproductive: Prostate is unremarkable. Other: No abdominal wall hernia or abnormality. No abdominopelvic ascites. Musculoskeletal: No acute or significant osseous findings. Review of the MIP images confirms the above findings. IMPRESSION: Atherosclerosis of thoracic and abdominal aorta is noted without aneurysm or dissection. Coronary artery calcifications are noted suggesting coronary artery disease. 7 mm nodule is noted in right lung base. Non-contrast chest CT at 6-12 months is recommended. If the nodule is stable at time of repeat CT, then future CT at 18-24 months (from today's scan) is considered optional for low-risk patients, but is recommended for high-risk patients. This  recommendation follows the consensus statement: Guidelines for Management of Incidental Pulmonary Nodules Detected on CT Images: From the Fleischner Society 2017; Radiology 2017; 284:228-243. No evidence of significant mesenteric artery stenosis. Moderate diffuse atheromatous disease is seen involving both renal arteries, with bilateral renal atrophy consistent with end-stage renal disease. Moderate eccentric stenosis is seen involving the proximal left common iliac artery. Multifocal moderate stenoses  are noted throughout both external iliac arteries. Electronically Signed   By: Marijo Conception, M.D.   On: 10/15/2018 20:08   US Abdomen Limited Ruq  Result Date: 10/19/2018 CLINICAL DATA:  Right upper quadrant pain EXAM: ULTRASOUND ABDOMEN LIMITED RIGHT UPPER QUADRANT COMPARISON:  None. FINDINGS: Gallbladder: Gallbladder is moderately distended without mural thickening or calculus. no sonographic Murphy sign noted by sonographer. Common bile duct: Diameter: 3 mm Liver: No focal lesion identified. The left hepatic lobe is partially obscured by bowel gas. Within normal limits in parenchymal echogenicity. Portal vein is patent on color Doppler imaging with normal direction of blood flow towards the liver. Other: Slight renal cortical thinning. IMPRESSION: No sonographic findings for the patient's right upper quadrant pain. Electronically Signed   By: Ashley Royalty M.D.   On: 10/19/2018 19:25      LAB RESULTS: Basic Metabolic Panel: Recent Labs  Lab 10/24/18 0733 10/25/18 0925  NA 131* 134*  K 5.0 4.1  CL 92* 95*  CO2 24 26  GLUCOSE 126* 150*  BUN 54* 43*  CREATININE 11.52* 7.92*  CALCIUM 9.6 9.4  PHOS 5.3*  --    Liver Function Tests: Recent Labs  Lab 10/24/18 0733 10/25/18 0925  AST  --  25  ALT  --  58*  ALKPHOS  --  100  BILITOT  --  0.3  PROT  --  6.7  ALBUMIN 2.8* 2.7*   No results for input(s): LIPASE, AMYLASE in the last 168 hours. No results for input(s): AMMONIA in the last  168 hours. CBC: Recent Labs  Lab 10/24/18 0449 10/25/18 0925  WBC 24.1* 16.9*  NEUTROABS  --  11.0*  HGB 10.2* 11.0*  HCT 32.8* 36.2*  MCV 93.2 94.3  PLT 575* 654*   Cardiac Enzymes: Recent Labs  Lab 10/22/18 0932  CKTOTAL 42*   BNP: Invalid input(s): POCBNP CBG: Recent Labs  Lab 10/25/18 0755 10/25/18 1106  GLUCAP 133* 166*      Disposition and Follow-up: Discharge Instructions    Ambulatory referral to Rheumatology   Complete by:  As directed    57 year old male with abdominal pain, GI work-up negative, inflammatory markers elevated, recommended rheumatology work-up.   Diet Carb Modified   Complete by:  As directed    Increase activity slowly   Complete by:  As directed        DISPOSITION: Home   DISCHARGE FOLLOW-UP Follow-up Information    Sueanne Margarita, MD Follow up.   Specialty:  Cardiology Why:  the office will call with date and time Contact information: [address] [place] [address] [phone]        [facility] Follow up.   Specialty:  Family Medicine Why:  appt scheduled for 11/13/2018 at 3:10 pm, please call to reschedule if you are unable to keep appointment Contact information: [address] [place] [address] [phone]           Time coordinating discharge:  35 minutes Signed:   Estill Cotta M.D. [facility] 10/25/2018, 2:01 PM Pager: [phone]

## 2018-10-25 NOTE — Evaluation (Signed)
Occupational Therapy Evaluation Patient Details Name: Fernando Jones MRN: [id number] DOB: 1961/03/20 Today's Date: 10/25/2018    History of Present Illness Pt is a 57 y/o male admitted secondary to chest pain and abdominal pain. Pt is s/p L heart cath which showed mild to moderate CAD and per notes, to be managed medically. Abdominal pain of unknown etiology, however, per notes, pending GI consult. CT of abdomen negative for acute abnormality. PMH includes HTN, ESRD on HD TTS, tobacco abuse, and DM.    Clinical Impression   PATIENT HAS DECREASED I AND SAFETY WITH ADLS, AND MOBILITY. PATIENT IS HAVING INCREASED DIFFICULTY WITH PERFORMING LE ADLS SECONDARY TO ABD PAIN AND MAY NEED INSTRUCTION ON USE OF AE. ACUTE OT TO FOLLOW.     Follow Up Recommendations  Home health OT    Equipment Recommendations       Recommendations for Other Services       Precautions / Restrictions Precautions Precautions: Fall Restrictions Weight Bearing Restrictions: No      Mobility Bed Mobility Overal bed mobility: Needs Assistance Bed Mobility: Supine to Sit;Sit to Supine     Supine to sit: Modified independent (Device/Increase time) Sit to supine: Min assist(ASSIST TO BRING LEGS INTO BED)      Transfers       Sit to Stand: Min assist Stand pivot transfers: Min guard            Balance                                           ADL either performed or assessed with clinical judgement   ADL Overall ADL's : Needs assistance/impaired Eating/Feeding: Independent   Grooming: Wash/dry hands;Wash/dry face;Supervision/safety;Standing   Upper Body Bathing: Supervision/ safety;Set up;Sitting   Lower Body Bathing: Minimal assistance;Sit to/from stand   Upper Body Dressing : Supervision/safety;Set up;Sitting   Lower Body Dressing: Minimal assistance;Sit to/from stand   Toilet Transfer: Minimal assistance;Ambulation;Comfort height toilet   Toileting- Clothing  Manipulation and Hygiene: Min guard;Sit to/from stand       Functional mobility during ADLs: Minimal assistance General ADL Comments: PATIENT HAD DIFFICULTY WITH LE ADSL SECONDARY TO ABD PAIN. PATIENT MAY BENEFIT FROM AE TRAINING.      Vision Baseline Vision/History: Wears glasses Wears Glasses: At all times Patient Visual Report: No change from baseline       Perception     Praxis      Pertinent Vitals/Pain Pain Assessment: 0-10 Pain Score: 3  Pain Location: abdomen  Pain Descriptors / Indicators: Aching Pain Intervention(s): Limited activity within patient's tolerance;Monitored during session;Premedicated before session     Hand Dominance Left   Extremity/Trunk Assessment Upper Extremity Assessment Upper Extremity Assessment: Generalized weakness           Communication Communication Communication: No difficulties   Cognition Arousal/Alertness: Awake/alert Behavior During Therapy: WFL for tasks assessed/performed Overall Cognitive Status: Within Functional Limits for tasks assessed                                     General Comments       Exercises     Shoulder Instructions      Home Living Family/patient expects to be discharged to:: Private residence Living Arrangements: Non-relatives/Friends Available Help at Discharge: Friend(s);Available PRN/intermittently Type of Home: Apartment Home Access:  Level entry     Home Layout: One level     Bathroom Shower/Tub: Teacher, early years/pre: Handicapped height     Home Equipment: Wheelchair - manual;Grab bars - toilet;Grab bars - tub/shower          Prior Functioning/Environment Level of Independence: Independent with assistive device(s)        Comments: patient states he usually walks         OT Problem List:        OT Treatment/Interventions: Self-care/ADL training;DME and/or AE instruction;Therapeutic activities;Patient/family education    OT Goals(Current  goals can be found in the care plan section) Acute Rehab OT Goals Patient Stated Goal: I WANT TO GO HOME OT Goal Formulation: With patient Time For Goal Achievement: 11/08/18 Potential to Achieve Goals: Good  OT Frequency: Min 2X/week   Barriers to D/C: Decreased caregiver support          Co-evaluation              AM-PAC PT "6 Clicks" Daily Activity     Outcome Measure Help from another person eating meals?: None Help from another person taking care of personal grooming?: A Little Help from another person toileting, which includes using toliet, bedpan, or urinal?: A Little Help from another person bathing (including washing, rinsing, drying)?: A Little Help from another person to put on and taking off regular upper body clothing?: A Little Help from another person to put on and taking off regular lower body clothing?: A Lot 6 Click Score: 18   End of Session Equipment Utilized During Treatment: Gait belt Nurse Communication: (ok therapy)  Activity Tolerance: Patient limited by fatigue Patient left: in bed;with call bell/phone within reach;with bed alarm set  OT Visit Diagnosis: Unsteadiness on feet (R26.81);Pain Pain - Right/Left: (abdom)                Time: 4497-5300 OT Time Calculation (min): 38 min Charges:  OT General Charges $OT Visit: 1 Visit OT Evaluation $OT Eval Low Complexity: 1 Low OT Treatments $Self Care/Home Management : 5-11 mins  6 CLICKS  Elara Cocke 10/25/2018, 11:11 AM

## 2018-10-25 NOTE — Consult Note (Signed)
[facility] Consultation Note  Referring Provider: Dr. Ripudeep Rai ([facility]) Primary Care Physician:  Patient, No Pcp Per  Reason for Consultation:  Abdominal pain  HPI: Fernando Jones is a 57 y.o. male we're seeing for abdominal pain.  Is generalized, fleeting, burning, migratory, no clear alleviating or exacerbating factors.  Ongoing for few weeks, but other vague symptoms of numbness/tingling, headaches, weakness started after starting dialysis.  No blood in stool.  Had different types of abdominal pain years ago, saw GI MD in [place], reports having endoscopy and colonoscopy, all unrevealing.  Except for inflammatory markers, multiple other labs and imaging studies unrevealing here in hospital.   Past Medical History:  Diagnosis Date  . Erectile dysfunction 2012  . ESRD (end stage renal disease) (HCC)    w/Left ureteral stone/hydronephrosis/notes 12/06/2017  . Hepatitis 1973   "? kind"  . Hyperlipidemia 2012  . Hypertension   . Tobacco abuse 2012   1/2 pack per day  . Type II diabetes mellitus (HCC)     Past Surgical History:  Procedure Laterality Date  . APPENDECTOMY    . AV FISTULA PLACEMENT Right 12/09/2017   Procedure: ARTERIOVENOUS (AV) FISTULA CREATION;  Surgeon: Early, Todd F, MD;  Location: [facility] OR;  Service: Vascular;  Laterality: Right;  . CARDIAC CATHETERIZATION  05/12/2009   /notes 04/07/2011  . CYSTOSCOPY/URETEROSCOPY/HOLMIUM LASER/STENT PLACEMENT Left 12/08/2017   Procedure: CYSTOSCOPY/RETROGRADE PYLEOGRAM LEFT URETEROSCOPY AND STONE EXTRACTION;  Surgeon: Eskridge, Matthew, MD;  Location: [facility] ORS;  Service: Urology;  Laterality: Left;  . HOLMIUM LASER APPLICATION Left 12/08/2017   Procedure: HOLMIUM LASER APPLICATION;  Surgeon: Eskridge, Matthew, MD;  Location: [facility];  Service: Urology;  Laterality: Left;  . INSERTION OF DIALYSIS CATHETER Right 12/09/2017   Procedure: EXCHANGE  OF TUNNELED  DIALYSIS CATHETER RIGHT INTERNAL JUGULAR.;  Surgeon: Early, Todd F,  MD;  Location: [facility] OR;  Service: Vascular;  Laterality: Right;  . IR AV DIALY SHUNT INTRO NEEDLE/INTRACATH INITIAL W/PTA/IMG RIGHT Right 05/05/2018  . IR FLUORO GUIDE CV LINE RIGHT  12/07/2017  . IR REMOVAL TUN CV CATH W/O FL  06/05/2018  . IR US GUIDE VASC ACCESS RIGHT  12/07/2017  . IR US GUIDE VASC ACCESS RIGHT  05/05/2018  . LEFT HEART CATH AND CORONARY ANGIOGRAPHY N/A 10/17/2018   Procedure: LEFT HEART CATH AND CORONARY ANGIOGRAPHY;  Surgeon: Harding, David W, MD;  Location: [facility] IN[facility]ASIVE [facility];  Service: Cardiovascular;  Laterality: N/A;  . THROMBECTOMY W/ EMBOLECTOMY Right 06/30/2018   Procedure: THROMBECTOMY and revision ARTERIOVENOUS FISTULA right RADIOCEPHALIC;  Surgeon: Dickson, Christopher S, MD;  Location: [facility] OR;  Service: Vascular;  Laterality: Right;  . ULTRASOUND GUIDANCE FOR VASCULAR ACCESS  10/17/2018   Procedure: Ultrasound Guidance For Vascular Access;  Surgeon: Harding, David W, MD;  Location: [facility];  Service: Cardiovascular;;    Prior to Admission medications   Medication Sig Start Date End Date Taking? Authorizing Provider  aspirin EC 81 MG EC tablet Take 1 tablet (81 mg total) by mouth daily. 12/17/17  Yes Ogbata, Sylvester I, MD  camphor-menthol (SARNA) lotion Apply 1 application topically every 8 (eight) hours as needed for itching. 07/02/18  Yes Hall, Carole N, DO  carvedilol (COREG) 12.5 MG tablet Take 1 tablet (12.5 mg total) by mouth 2 (two) times daily with a meal. 12/16/17  Yes Ogbata, Sylvester I, MD  Darbepoetin Alfa (ARANESP) 200 MCG/0.4ML SOSY injection 200mcg to be given with dialysis every 14 days 12/16/17  Yes Ogbata, Sylvester I, MD    ferric citrate (AURYXIA) 1 GM 210 MG(Fe) tablet Take 1 tablet (210 mg total) by mouth 3 (three) times daily with meals. 07/02/18  Yes Hall, Carole N, DO  hydrALAZINE (APRESOLINE) 25 MG tablet Take 1 tablet (25 mg total) by mouth every 8 (eight) hours. 12/16/17  Yes Ogbata, Sylvester I, MD  losartan (COZAAR) 50 MG tablet Take 50  mg by mouth daily. 05/24/18  Yes [provider]  midodrine (PROAMATINE) 10 MG tablet Take 10 mg by mouth See admin instructions. Take 1 tablet (10 mg totally) by mouth three times a week with dialysis 09/29/18  Yes [provider]  mupirocin ointment (BACTROBAN) 2 % Place 1 application into the nose 2 (two) times daily. Patient taking differently: Place 1 application into the nose 2 (two) times daily as needed (for skin rash).  07/02/18  Yes Hall, Carole N, DO  Nutritional Supplements (FEEDING SUPPLEMENT, NEPRO CARB STEADY,) LIQD Take 237 mLs by mouth 2 (two) times daily between meals. 12/16/17  Yes Ogbata, Sylvester I, MD  oxyCODONE (ROXICODONE) 5 MG immediate release tablet Take 1 tablet (5 mg total) by mouth every 4 (four) hours as needed for severe pain. 06/30/18  Yes Dickson, Christopher S, MD  ranitidine (ZANTAC) 150 MG tablet Take 150 mg by mouth daily. 05/24/18  Yes [provider]  rOPINIRole (REQUIP) 0.25 MG tablet Take 0.25-0.5 mg by mouth at bedtime. 09/29/18  Yes [provider]  tamsulosin (FLOMAX) 0.4 MG CAPS capsule Take 1 capsule (0.4 mg total) by mouth daily. 12/16/17  Yes Ogbata, Sylvester I, MD  atorvastatin (LIPITOR) 10 MG tablet Take 1 tablet (10 mg total) by mouth at bedtime. 10/25/18   Rai, Ripudeep K, MD  diclofenac sodium (VOLTAREN) 1 % GEL Apply 2 g topically 4 (four) times daily. Apply to abdomen 10/25/18   Rai, Ripudeep K, MD  dicyclomine (BENTYL) 20 MG tablet Take 1 tablet (20 mg total) by mouth 3 (three) times daily before meals. 10/25/18   Rai, Ripudeep K, MD  ondansetron (ZOFRAN) 4 MG tablet Take 1 tablet (4 mg total) by mouth every 6 (six) hours as needed for nausea. 10/25/18   Rai, Ripudeep K, MD  pantoprazole (PROTONIX) 40 MG tablet Take 1 tablet (40 mg total) by mouth 2 (two) times daily before a meal. 10/25/18   Rai, Ripudeep K, MD    Current Facility-Administered Medications  Medication Dose Route Frequency Provider Last Rate Last  Dose  . 0.9 %  sodium chloride infusion  250 mL Intravenous PRN Harding, David W, MD 10 mL/hr at 10/17/18 1719    . acetaminophen (TYLENOL) tablet 650 mg  650 mg Oral Q6H PRN Harding, David W, MD   650 mg at 10/24/18 0118  . alum & mag hydroxide-simeth (MAALOX/MYLANTA) 200-200-20 MG/5ML suspension 30 mL  30 mL Oral Q4H PRN Nettey, Ralph A, MD   30 mL at 10/22/18 2118  . aspirin EC tablet 81 mg  81 mg Oral Daily Harding, David W, MD   81 mg at 10/25/18 1022  . atorvastatin (LIPITOR) tablet 10 mg  10 mg Oral q1800 Turner, Traci R, MD   10 mg at 10/24/18 1735  . Chlorhexidine Gluconate Cloth 2 % PADS 6 each  6 each Topical Q0600 Schertz, Robert, MD   6 each at 10/25/18 0654  . diclofenac sodium (VOLTAREN) 1 % transdermal gel 2 g  2 g Topical QID Nettey, Ralph A, MD   2 g at 10/25/18 1023  . dicyclomine (BENTYL) tablet 20 mg    20 mg Oral TID AC Nettey, Ralph A, MD   20 mg at 10/25/18 1205  . doxercalciferol (HECTOROL) injection 3 mcg  3 mcg Intravenous Q T,Th,Sa-HD Harding, David W, MD   3 mcg at 10/24/18 1126  . ferric citrate (AURYXIA) tablet 210 mg  210 mg Oral TID WC Schertz, Robert, MD   210 mg at 10/25/18 1204  . heparin injection 5,000 Units  5,000 Units Subcutaneous Q8H Harding, David W, MD   5,000 Units at 10/25/18 0651  . insulin aspart (novoLOG) injection 0-9 Units  0-9 Units Subcutaneous TID WC Harding, David W, MD   2 Units at 10/25/18 1204  . loperamide (IMODIUM) capsule 2 mg  2 mg Oral PRN Akula, Vijaya, MD   2 mg at 10/18/18 0223  . midodrine (PROAMATINE) tablet 10 mg  10 mg Oral Q T,Th,Sa-HD Harding, David W, MD   10 mg at 10/24/18 1019  . ondansetron (ZOFRAN) injection 4 mg  4 mg Intravenous Q6H PRN Harding, David W, MD   4 mg at 10/24/18 0120  . oxyCODONE (Oxy IR/ROXICODONE) immediate release tablet 5 mg  5 mg Oral Q4H PRN Harding, David W, MD   5 mg at 10/25/18 1204  . pantoprazole (PROTONIX) EC tablet 40 mg  40 mg Oral BID AC Harding, David W, MD   40 mg at 10/25/18 0825  .  rOPINIRole (REQUIP) tablet 0.25 mg  0.25 mg Oral QHS Nettey, Ralph A, MD   0.25 mg at 10/24/18 2245  . rOPINIRole (REQUIP) tablet 0.25 mg  0.25 mg Oral QHS PRN Nettey, Ralph A, MD      . sodium chloride flush (NS) 0.9 % injection 3 mL  3 mL Intravenous Q12H Harding, David W, MD   3 mL at 10/25/18 1023  . sodium chloride flush (NS) 0.9 % injection 3 mL  3 mL Intravenous PRN Harding, David W, MD      . sorbitol 70 % solution 30 mL  30 mL Oral TID PRN Zeyfang, David, PA-C        Allergies as of 10/14/2018  . (No Known Allergies)    Family History  Problem Relation Age of Onset  . Congestive Heart Failure Mother   . Diabetes Neg Hx     Social History   Socioeconomic History  . Marital status: Divorced    Spouse name: Not on file  . Number of children: Not on file  . Years of education: Not on file  . Highest education level: Not on file  Occupational History  . Not on file  Social Needs  . Financial resource strain: Not on file  . Food insecurity:    Worry: Not on file    Inability: Not on file  . Transportation needs:    Medical: Not on file    Non-medical: Not on file  Tobacco Use  . Smoking status: Former Smoker    Packs/day: 1.00    Types: Cigarettes    Last attempt to quit: 12/06/2016    Years since quitting: 1.8  . Smokeless tobacco: Never Used  Substance and Sexual Activity  . Alcohol use: No  . Drug use: No  . Sexual activity: Not Currently  Lifestyle  . Physical activity:    Days per week: Not on file    Minutes per session: Not on file  . Stress: Not on file  Relationships  . Social connections:    Talks on phone: Not on file    Gets together: Not on   file    Attends religious service: Not on file    Active member of club or organization: Not on file    Attends meetings of clubs or organizations: Not on file    Relationship status: Not on file  . Intimate partner violence:    Fear of current or ex partner: Not on file    Emotionally abused: Not on file     Physically abused: Not on file    Forced sexual activity: Not on file  Other Topics Concern  . Not on file  Social History Narrative  . Not on file    Review of Systems: As per HPI, all others negative  Physical Exam: Vital signs in last 24 hours: Temp:  [98 F (36.7 C)-98.6 F (37 C)] 98 F (36.7 C) (11/20 0756) Pulse Rate:  [72-98] 98 (11/20 0756) Resp:  [18-21] 21 (11/20 0756) BP: (101-120)/(48-75) 120/65 (11/20 0756) SpO2:  [92 %-100 %] 100 % (11/20 0756) Last BM Date: 10/21/18 General:   Alert,  Older-appearing than stated age, pleasant and cooperative in NAD Head:  Normocephalic and atraumatic. Eyes:  Sclera clear, no icterus.   Conjunctiva pink. Ears:  Normal auditory acuity. Nose:  No deformity, discharge,  or lesions. Mouth:  No deformity or lesions.  Oropharynx pink & moist. Neck:  Supple; no masses or thyromegaly. Lungs:  Clear throughout to auscultation.   No wheezes, crackles, or rhonchi. No acute distress. Heart:  Regular rate and rhythm; no murmurs, clicks, rubs,  or gallops. Abdomen:  Soft, generalized tenderness without peritonitis, nondistended. No masses, hepatosplenomegaly or hernias noted. Normal bowel sounds, without guarding, and without rebound.     Msk:  Symmetrical without gross deformities. Normal posture. Pulses:  Normal pulses noted. Extremities:  Without clubbing or edema. Neurologic:  Alert and  oriented x4;  grossly normal neurologically. Skin:  Intact without significant lesions or rashes. Psych:  Alert and cooperative. Normal mood and affect.   Lab Results: Recent Labs    10/23/18 1226 10/24/18 0449 10/25/18 0925  WBC 16.5* 24.1* 16.9*  HGB 11.2* 10.2* 11.0*  HCT 35.9* 32.8* 36.2*  PLT 518* 575* 654*   BMET Recent Labs    10/23/18 1226 10/24/18 0733 10/25/18 0925  NA 132* 131* 134*  K 4.4 5.0 4.1  CL 92* 92* 95*  CO2 26 24 26  GLUCOSE 211* 126* 150*  BUN 39* 54* 43*  CREATININE 9.49* 11.52* 7.92*  CALCIUM 9.5 9.6 9.4    LFT Recent Labs    10/25/18 0925  PROT 6.7  ALBUMIN 2.7*  AST 25  ALT 58*  ALKPHOS 100  BILITOT 0.3   PT/INR No results for input(s): LABPROT, INR in the last 72 hours.  Studies/Results: Ct Cervical Spine Wo Contrast  Result Date: 10/25/2018 CLINICAL DATA:  Cervical radiculopathy. Right shoulder and arm pain. EXAM: CT CERVICAL SPINE WITHOUT CONTRAST TECHNIQUE: Multidetector CT imaging of the cervical spine was performed without intravenous contrast. Multiplanar CT image reconstructions were also generated. COMPARISON:  None. FINDINGS: Alignment: Normal Skull base and vertebrae: Negative for fracture or mass lesion. Soft tissues and spinal canal: Negative for soft tissue mass. Atherosclerotic calcification aortic arch proximal great vessels and carotid bifurcation bilaterally. Disc levels:  C2-3: Negative C3-4: Minimal central disc protrusion without stenosis C4-5: Minimal central disc protrusion. Mild disc degeneration without stenosis C5-6: Mild disc degeneration and mild facet degeneration. Negative for stenosis C6-7: Moderate disc degeneration and spondylosis. Mild foraminal stenosis bilaterally due to spurring. Spinal canal adequate in size C7-T1:   Negative Upper chest: Negative Other: None IMPRESSION: Mild degenerative changes cervical spine most prominent at C6-7. Mild foraminal narrowing bilaterally C6-7 without definite neural impingement. Atherosclerotic disease. Electronically Signed   By: Franchot Gallo M.D.   On: 10/25/2018 09:27    Impression:  1.  Abdominal pain.  Very vague, migratory, multiple other symptoms (extremity numbness, radiation to arm and neck).  CT abd/pelvis angio and CT cervical spine and U/S and labs  (except for CRP/ESR) negative.  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  Maybe some of his symptoms are also related to dialysis (?), patient reports pain worse since starting dialysis.   Although patient reports this pain is different, he does report having other abdominal pains in past, saw GI doctor in [place] years ago, and was told he needed to see psychiatrist for the pain at that time. 2.  Constipation, diarrhea.  Irritable bowel?  No durable improvement with antispasmodics.  Plan:  1.  Beyond ESR/CRP, patient's scans and labs are unrevealing.  He has a benign abdominal exam.  Patient's symptoms are very fleeting and vague and not consistent with any particular intrinsic GI tract process.   2.  Would see if rheumatology has any input re: patient's symptoms with elevated inflammatory markers.  I, for one, don't feel that empiric trial of steroids is indicated without a better idea of what is going on. 3.  I do not recommend any further GI work-up; and if the rheumatology folks don't have any further suggestions, he can follow-up with his gastroenterologist in [facility] if desired; otherwise, would pursue outpatient rheumatology consultation followed by (if rheumatology has no further insight into patient's symptoms) tertiary care center GI evaluation for second opinion of his abdominal pain might at the very least rule out very rare conditions (porphyria, etc.) and provide some sense of reassurance. 3.  [facility] will sign-off; thank you for the consultation.   LOS: 9 days   Aylan Bayona M  10/25/2018, 1:11 PM  Cell [phone] If no answer or after 5 PM call [phone]

## 2018-10-26 LAB — PATHOLOGIST SMEAR REVIEW

## 2018-11-13 ENCOUNTER — Inpatient Hospital Stay (INDEPENDENT_AMBULATORY_CARE_PROVIDER_SITE_OTHER): Payer: Self-pay | Admitting: Physician Assistant

## 2018-11-15 ENCOUNTER — Other Ambulatory Visit: Payer: Self-pay

## 2018-11-21 ENCOUNTER — Encounter: Payer: Self-pay | Admitting: Physician Assistant

## 2018-11-21 NOTE — Progress Notes (Deleted)
Cardiology Office Note    Date:  11/21/2018  ID:  Fernando Jones, DOB [birth year], MRN [id number] PCP:  Patient, No Pcp Per  Cardiologist:  Fransico Him, MD   Chief Complaint: f/u cath  History of Present Illness:  Fernando Jones is a 57 y.o. male with history of ESRD on hemodialysis, hypertension with history of hypotension with dialysis, hyperlipidemia, type 2 diabetes mellitus, and recently diagnosed nonobstructive CAD, pulm nodule, PAD by imaging. He was admitted 10/2018 with fairly diffuse persistent pain in the abdomen, thorax, neck and shoulders, also arms without obvious precipitant. He had reported compliance with hemodialysis and his medications. Troponins were mildly elevated (0.15, 0.26, and 0.20). 2D echo showed mild LVH, EF 60-65%, grade 1 DD, normal RV function, small pericardial effusion was identified circumferential to the heart with signs of organization versus chronicity. Cardiac cath 10/17/18 showed low-normal LVEDP, 40% ostial D1, 40% prox LAD with 50% stenosed side branch in ostial 2nd diag. Dr. Radford Pax recommended starting ASA and low dose Lipitor. CT angio imaging showed atherosclerosis of thoracic and abdominal aorta without aneurysm or dissection, 10m pulm nodule, moderate diffuse atheromatous disease of both renal arteries, moderate eccentric stenosis is seen involving the proximal left common iliac artery, multifocal moderate stenoses are noted throughout both external iliac arteries, no pericardial effusion. GI was consulted, seen by Dr. OPaulita Fujita had a benign abdominal exam and did not feel patient had any intrinsic GI tract process. Patient reports that pain improved with Bentyl and Voltaren gel. OP rheumatology evaluation was recommended. Last labs during admission showed Na 134, K 4.1, albumin 2.7, AST 25, ALT 58, leukocytosis with WBC 24.1k, Hgb 10.2, ESR and sed rate elevated, negative hemoccult, lipase wnl, no recent lipids on file. It appears carvedilol,  hydralazine, and losartan were stopped by IM.   Cath site pulm nodule pcp Rheum Liver/lipids r/s Pericardial effusion  Mild CAD HTN Hyperlipidemia Pericardial effusion   Past Medical History:  Diagnosis Date  . Anemia   . Erectile dysfunction 2012  . [place] ([place] disease[place]    w/Left ureteral stone/hydronephrosis/notes 12/06/2017  . Hepatitis 1973   "? kind"  . Hyperlipidemia 2012  . Hypertension   . Leukocytosis   . Mild CAD    a. by cath 10/2018.  .Marland KitchenPericardial effusion    a. small by echo 10/2018.  . Pulmonary nodule    a. by CT 10/2018.  . Tobacco abuse 2012   1/2 pack per day  . [place]     Past Surgical History:  Procedure Laterality Date  . APPENDECTOMY    . AV FISTULA PLACEMENT Right 12/09/2017   Procedure: ARTERIOVENOUS (AV) FISTULA CREATION;  Surgeon: ERosetta Posner MD;  [facility]  Service: Vascular;  Laterality: Right;  . CARDIAC CATHETERIZATION  05/12/2009   /Archie Endo5/01/2011  . CYSTOSCOPY/URETEROSCOPY/HOLMIUM LASER/STENT PLACEMENT Left 12/08/2017   Procedure: CYSTOSCOPY/RETROGRADE PYLEOGRAM LEFT URETEROSCOPY AND STONE EXTRACTION;  Surgeon: EFestus Aloe MD;  [facility];  Service: Urology;  Laterality: Left;  . HOLMIUM LASER APPLICATION Left 13/04/3613  Procedure: HOLMIUM LASER APPLICATION;  Surgeon: EFestus Aloe MD;  [facility];  Service: Urology;  Laterality: Left;  . INSERTION OF DIALYSIS CATHETER Right 12/09/2017   Procedure: EXCHANGE  OF TUNNELED  DIALYSIS CATHETER RIGHT INTERNAL JUGULAR.;  Surgeon: ERosetta Posner MD;  [facility]  Service: Vascular;  Laterality: Right;  . IR AV DIALY SHUNT INTRO NEEDLE/INTRACATH INITIAL W/PTA/IMG RIGHT Right 05/05/2018  .  IR FLUORO GUIDE CV LINE RIGHT  12/07/2017  . IR REMOVAL TUN CV CATH W/O FL  06/05/2018  . IR US GUIDE VASC ACCESS RIGHT  12/07/2017  . IR US GUIDE VASC ACCESS RIGHT  05/05/2018  . LEFT HEART CATH AND CORONARY ANGIOGRAPHY N/A 10/17/2018    Procedure: LEFT HEART CATH AND CORONARY ANGIOGRAPHY;  Surgeon: Leonie Man, MD;  Location: [facility];  Service: Cardiovascular;  Laterality: N/A;  . THROMBECTOMY W/ EMBOLECTOMY Right 06/30/2018   Procedure: THROMBECTOMY and revision ARTERIOVENOUS FISTULA right RADIOCEPHALIC;  Surgeon: Angelia Mould, MD;  Location: [facility];  Service: Vascular;  Laterality: Right;  . ULTRASOUND GUIDANCE FOR VASCULAR ACCESS  10/17/2018   Procedure: Ultrasound Guidance For Vascular Access;  Surgeon: Leonie Man, MD;  Location: [facility];  Service: Cardiovascular;;    Current Medications: No outpatient medications have been marked as taking for the 11/22/18 encounter (Appointment) with Charlie Pitter, PA-C.   ***   Allergies:   Patient has no known allergies.   Social History   Socioeconomic History  . Marital status: Divorced    Spouse name: Not on file  . Number of children: Not on file  . Years of education: Not on file  . Highest education level: Not on file  Occupational History  . Not on file  Social Needs  . Financial resource strain: Not on file  . Food insecurity:    Worry: Not on file    Inability: Not on file  . Transportation needs:    Medical: Not on file    Non-medical: Not on file  Tobacco Use  . Smoking status: Former Smoker    Packs/day: 1.00    Types: Cigarettes    Last attempt to quit: 12/06/2016    Years since quitting: 1.9  . Smokeless tobacco: Never Used  Substance and Sexual Activity  . Alcohol use: No  . Drug use: No  . Sexual activity: Not Currently  Lifestyle  . Physical activity:    Days per week: Not on file    Minutes per session: Not on file  . Stress: Not on file  Relationships  . Social connections:    Talks on phone: Not on file    Gets together: Not on file    Attends religious service: Not on file    Active member of club or organization: Not on file    Attends meetings of clubs or organizations: Not on file     Relationship status: Not on file  Other Topics Concern  . Not on file  Social History Narrative  . Not on file     Family History:  The patient's ***family history includes Congestive Heart Failure in his mother. There is no history of Diabetes.  ROS:   Please see the history of present illness. Otherwise, review of systems is positive for ***.  All other systems are reviewed and otherwise negative.    PHYSICAL EXAM:   VS:  There were no vitals taken for this visit.  BMI: There is no height or weight on file to calculate BMI. GEN: Well nourished, well developed, in no acute distress HEENT: normocephalic, atraumatic Neck: no JVD, carotid bruits, or masses Cardiac: ***RRR; no murmurs, rubs, or gallops, no edema  Respiratory:  clear to auscultation bilaterally, normal work of breathing GI: soft, nontender, nondistended, + BS MS: no deformity or atrophy Skin: warm and dry, no rash Neuro:  Alert and Oriented x 3, Strength and sensation  are intact, follows commands Psych: euthymic mood, full affect  Wt Readings from Last 3 Encounters:  10/24/18 159 lb 6.3 oz (72.3 kg)  07/02/18 156 lb 4.9 oz (70.9 kg)  02/21/18 143 lb 12.8 oz (65.2 kg)      Studies/Labs Reviewed:   EKG:  EKG was ordered today and personally reviewed by me and demonstrates *** EKG was not ordered today.***  Recent Labs: 12/06/2017: B Natriuretic Peptide 2,214.9 12/11/2017: TSH 1.731 10/15/2018: Magnesium 2.2 10/25/2018: ALT 58; BUN 43; Creatinine, Ser 7.92; Hemoglobin 11.0; Platelets 654; Potassium 4.1; Sodium 134   Lipid Panel    Component Value Date/Time   CHOL 224 (H) 06/23/2009 2155   TRIG 407 (H) 06/23/2009 2155   HDL 30 (L) 06/23/2009 2155   CHOLHDL 7.5 Ratio 06/23/2009 2155   VLDL NOT CALC mg/dL 06/23/2009 2155   [place] See Comment mg/dL 06/23/2009 2155    Additional studies/ records that were reviewed today include: Summarized above.***    ASSESSMENT & PLAN:   1. ***  Disposition: F/u  with ***   Medication Adjustments/Labs and Tests Ordered: Current medicines are reviewed at length with the patient today.  Concerns regarding medicines are outlined above. Medication changes, Labs and Tests ordered today are summarized above and listed in the Patient Instructions accessible in Encounters.   Signed, Charlie Pitter, PA-C  11/21/2018 8:19 AM    [address], [place], [place]  [address] Phone: [phone]; Fax: [phone]

## 2018-11-22 ENCOUNTER — Ambulatory Visit: Payer: Self-pay | Admitting: Physician Assistant

## 2018-11-23 ENCOUNTER — Telehealth: Payer: Self-pay | Admitting: Physician Assistant

## 2018-11-23 ENCOUNTER — Encounter: Payer: Self-pay | Admitting: Physician Assistant

## 2018-11-23 NOTE — Telephone Encounter (Signed)
Records received from [facility] on 11/22/18, Appt 11/28/18 @ 11:00PM. NV

## 2018-11-28 ENCOUNTER — Ambulatory Visit: Payer: Self-pay | Admitting: Physician Assistant

## 2018-12-15 ENCOUNTER — Encounter: Payer: Self-pay | Admitting: Physician Assistant

## 2018-12-15 ENCOUNTER — Other Ambulatory Visit: Payer: Self-pay | Admitting: Physician Assistant

## 2018-12-15 ENCOUNTER — Ambulatory Visit (INDEPENDENT_AMBULATORY_CARE_PROVIDER_SITE_OTHER): Payer: Self-pay | Admitting: Physician Assistant

## 2018-12-15 VITALS — BP 98/62 | HR 102 | Ht 66.0 in | Wt 160.0 lb

## 2018-12-15 DIAGNOSIS — Z992 Dependence on renal dialysis: Secondary | ICD-10-CM

## 2018-12-15 DIAGNOSIS — R1084 Generalized abdominal pain: Secondary | ICD-10-CM

## 2018-12-15 DIAGNOSIS — E119 Type 2 diabetes mellitus without complications: Secondary | ICD-10-CM

## 2018-12-15 DIAGNOSIS — N186 End stage renal disease: Secondary | ICD-10-CM

## 2018-12-15 DIAGNOSIS — I2510 Atherosclerotic heart disease of native coronary artery without angina pectoris: Secondary | ICD-10-CM

## 2018-12-15 DIAGNOSIS — I10 Essential (primary) hypertension: Secondary | ICD-10-CM

## 2018-12-15 DIAGNOSIS — E782 Mixed hyperlipidemia: Secondary | ICD-10-CM

## 2018-12-15 NOTE — Patient Instructions (Signed)
Medication Instructions:  NO CHANGE If you need a refill on your cardiac medications before your next appointment, please call your pharmacy.   Lab work: Your physician recommends that you return for lab work WHEN CONVENIENT =PRIOR TO EATING IN 6 WEEKS Your physician recommends that you return for lab work FIRST OF NEXT WEEK If you have labs (blood work) drawn today and your tests are completely normal, you will receive your results only by: Marland Kitchen MyChart Message (if you have MyChart) OR . A paper copy in the mail If you have any lab test that is abnormal or we need to change your treatment, we will call you to review the results.  Follow-Up: At [facility], you and your health needs are our priority.  As part of our continuing mission to provide you with exceptional heart care, we have created designated Provider Care Teams.  These Care Teams include your primary Cardiologist (physician) and Advanced Practice Providers (APPs -  Physician Assistants and Nurse Practitioners) who all work together to provide you with the care you need, when you need it.  Your physician recommends that you schedule a follow-up appointment in: [place]

## 2018-12-15 NOTE — Progress Notes (Addendum)
Cardiology Office Note    Date:  12/17/2018   ID:  Fernando Jones, Fernando Jones 24-Sep-1961, MRN [id number]  PCP:  Patient, No Pcp Per  Cardiologist: Dr. Ellyn Hack  Chief Complaint  Patient presents with  . Follow-up    seen for Dr. Ellyn Hack.     History of Present Illness:  Fernando Jones is a 58 y.o. male with PMH of ESRD on HD ([facility]), HTN, HLD, GERD, DM II, tobacco abuse.  He had echocardiogram in 2017 which revealed PA pressure of 52 mmHg, EF of 55 to 60%, grade 3 DD with LVH.  He recently presented to the hospital with diffuse body pain and found to have a troponin of 0.1.  Troponin I was as high as 0.07.  White blood cell count was 15,000 and was left shift.  The symptom was atypical for ischemia.  Echocardiogram obtained during this admission showed a normal EF of 60 to 65%, no regional wall motion abnormality.  He had a small pericardial effusion but this was not noted on the chest CT.  CT of the chest showed coronary artery atherosclerosis, 7 mm nodule noted in the right lung base, no significant evidence of mesenteric artery stenosis, moderate stenosis involving proximal left common iliac artery, multifocal moderate stenosis noted throughout the external iliac artery, no thoracic aortic dissection or aneurysm.  Cardiac catheterization was eventually performed on 10/17/2018 which showed no aortic valve stenosis, 40% ostial D1, 40% proximal LAD with 50% ostial D2 lesion, normal to low LVEDP.  Patient was started on aspirin and Lipitor.  It was plan to obtain fasting lipid panel and LFT in 6 weeks.  He also has significant abdominal pain during the admission.  Ultrasound significant for bowel gas.  He had associated diarrhea and vomiting.  GI service was consulted and the patient was evaluated by Dr. Paulita Fujita, no further GI work-up was also recommended.  Patient presents today for cardiology office visit.  We discussed the recent catheter report.  He is aware that he will need to focus on  controlling risk factors such as hyperlipidemia.  He will need a lipid panel and LFTs in 2 months.  His main concern today is diffuse abdominal pain.  The symptom is the worst in the epigastric area but also involve right upper quadrant and left upper quadrant.  His recent CT scan was negative for acute process.  I am concerned of possibility of gastritis versus ulcers.  I plan to obtain a H. pylori urea breath test as initial work-up.  He will need to establish with primary care provider and a gastroenterologist.   Interview was conducted using a [place] translator [name]   Past Medical History:  Diagnosis Date  . Anemia   . Erectile dysfunction 2012  . ESRD (end stage renal disease) ([place])    w/Left ureteral stone/hydronephrosis/notes 12/06/2017  . Hepatitis 1973   "? kind"  . Hyperlipidemia 2012  . Hypertension   . Leukocytosis   . Mild CAD    a. by cath 10/2018.  Marland Kitchen Pericardial effusion    a. small by echo 10/2018.  . Pulmonary nodule    a. by CT 10/2018.  . Tobacco abuse 2012   1/2 pack per day  . Type II diabetes mellitus ([place])     Past Surgical History:  Procedure Laterality Date  . APPENDECTOMY    . AV FISTULA PLACEMENT Right 12/09/2017   Procedure: ARTERIOVENOUS (AV) FISTULA CREATION;  Surgeon: Rosetta Posner, MD;  [facility];  Service: Vascular;  Laterality: Right;  . CARDIAC CATHETERIZATION  05/12/2009   Archie Endo 04/07/2011  . CYSTOSCOPY/URETEROSCOPY/HOLMIUM LASER/STENT PLACEMENT Left 12/08/2017   Procedure: CYSTOSCOPY/RETROGRADE PYLEOGRAM LEFT URETEROSCOPY AND STONE EXTRACTION;  Surgeon: Festus Aloe, MD;  [facility];  Service: Urology;  Laterality: Left;  . HOLMIUM LASER APPLICATION Left 07/08/3824   Procedure: HOLMIUM LASER APPLICATION;  Surgeon: Festus Aloe, MD;  [facility];  Service: Urology;  Laterality: Left;  . INSERTION OF DIALYSIS CATHETER Right 12/09/2017   Procedure: EXCHANGE  OF TUNNELED  DIALYSIS CATHETER RIGHT INTERNAL JUGULAR.;   Surgeon: Rosetta Posner, MD;  Location: [facility];  Service: Vascular;  Laterality: Right;  . IR AV DIALY SHUNT INTRO NEEDLE/INTRACATH INITIAL W/PTA/IMG RIGHT Right 05/05/2018  . IR FLUORO GUIDE CV LINE RIGHT  12/07/2017  . IR REMOVAL TUN CV CATH W/O FL  06/05/2018  . IR US GUIDE VASC ACCESS RIGHT  12/07/2017  . IR US GUIDE VASC ACCESS RIGHT  05/05/2018  . LEFT HEART CATH AND CORONARY ANGIOGRAPHY N/A 10/17/2018   Procedure: LEFT HEART CATH AND CORONARY ANGIOGRAPHY;  Surgeon: Leonie Man, MD;  Location: [facility];  Service: Cardiovascular;  Laterality: N/A;  . THROMBECTOMY W/ EMBOLECTOMY Right 06/30/2018   Procedure: THROMBECTOMY and revision ARTERIOVENOUS FISTULA right RADIOCEPHALIC;  Surgeon: Angelia Mould, MD;  Location: [facility];  Service: Vascular;  Laterality: Right;  . ULTRASOUND GUIDANCE FOR VASCULAR ACCESS  10/17/2018   Procedure: Ultrasound Guidance For Vascular Access;  Surgeon: Leonie Man, MD;  Location:  [facility];  Service: Cardiovascular;;    Current Medications: Outpatient Medications Prior to Visit  Medication Sig Dispense Refill  . aspirin EC 81 MG EC tablet Take 1 tablet (81 mg total) by mouth daily. 30 tablet 1  . atorvastatin (LIPITOR) 10 MG tablet Take 1 tablet (10 mg total) by mouth at bedtime. 30 tablet 3  . dicyclomine (BENTYL) 20 MG tablet Take 1 tablet (20 mg total) by mouth 3 (three) times daily before meals. 90 tablet 3  . ferric citrate (AURYXIA) 1 GM 210 MG(Fe) tablet Take 1 tablet (210 mg total) by mouth 3 (three) times daily with meals. 270 tablet 0  . ondansetron (ZOFRAN) 4 MG tablet Take 1 tablet (4 mg total) by mouth every 6 (six) hours as needed for nausea. 20 tablet 0  . pantoprazole (PROTONIX) 40 MG tablet Take 1 tablet (40 mg total) by mouth 2 (two) times daily before a meal. 60 tablet 2  . ranitidine (ZANTAC) 150 MG tablet Take 150 mg by mouth daily.  3  . rOPINIRole (REQUIP) 0.25 MG tablet Take 0.25-0.5 mg by mouth at bedtime.  3    . tamsulosin (FLOMAX) 0.4 MG CAPS capsule Take 1 capsule (0.4 mg total) by mouth daily. 30 capsule 0  . carvedilol (COREG) 12.5 MG tablet Take 12.5 mg by mouth 2 (two) times daily with a meal.    . losartan (COZAAR) 50 MG tablet Take 50 mg by mouth daily.    . camphor-menthol (SARNA) lotion Apply 1 application topically every 8 (eight) hours as needed for itching. 222 mL 0  . Darbepoetin Alfa (ARANESP) 200 MCG/0.4ML SOSY injection 221mcg to be given with dialysis every 14 days 1.68 mL 0  . diclofenac sodium (VOLTAREN) 1 % GEL Apply 2 g topically 4 (four) times daily. Apply to abdomen 100 g 3  . midodrine (PROAMATINE) 10 MG tablet Take 10 mg by mouth See admin instructions. Take 1 tablet (10  mg totally) by mouth three times a week with dialysis  3  . Nutritional Supplements (FEEDING SUPPLEMENT, NEPRO CARB STEADY,) LIQD Take 237 mLs by mouth 2 (two) times daily between meals. 60 Can 1  . oxyCODONE (ROXICODONE) 5 MG immediate release tablet Take 1 tablet (5 mg total) by mouth every 4 (four) hours as needed for severe pain. 12 tablet 0   No facility-administered medications prior to visit.      Allergies:   Patient has no known allergies.   Social History   Socioeconomic History  . Marital status: Divorced    Spouse name: Not on file  . Number of children: Not on file  . Years of education: Not on file  . Highest education level: Not on file  Occupational History  . Not on file  Social Needs  . Financial resource strain: Not on file  . Food insecurity:    Worry: Not on file    Inability: Not on file  . Transportation needs:    Medical: Not on file    Non-medical: Not on file  Tobacco Use  . Smoking status: Former Smoker    Packs/day: 1.00    Types: Cigarettes    Last attempt to quit: 12/06/2016    Years since quitting: 2.0  . Smokeless tobacco: Never Used  Substance and Sexual Activity  . Alcohol use: No  . Drug use: No  . Sexual activity: Not Currently  Lifestyle  . Physical  activity:    Days per week: Not on file    Minutes per session: Not on file  . Stress: Not on file  Relationships  . Social connections:    Talks on phone: Not on file    Gets together: Not on file    Attends religious service: Not on file    Active member of club or organization: Not on file    Attends meetings of clubs or organizations: Not on file    Relationship status: Not on file  Other Topics Concern  . Not on file  Social History Narrative  . Not on file     Family History:  The patient's family history includes Congestive Heart Failure in his mother.   ROS:   Please see the history of present illness.    ROS All other systems reviewed and are negative.   PHYSICAL EXAM:   VS:  BP 98/62   Pulse (!) 102   Ht 5\' 6"  (1.676 m)   Wt 160 lb (72.6 kg)   BMI 25.82 kg/m    GEN: Well nourished, well developed, in no acute distress  HEENT: normal  Neck: no JVD, carotid bruits, or masses Cardiac: RRR; no murmurs, rubs, or gallops,no edema  Respiratory:  clear to auscultation bilaterally, normal work of breathing GI: soft, nontender, nondistended, + BS MS: no deformity or atrophy  Skin: warm and dry, no rash Neuro:  Alert and Oriented x 3, Strength and sensation are intact Psych: euthymic mood, full affect  Wt Readings from Last 3 Encounters:  12/15/18 160 lb (72.6 kg)  10/24/18 159 lb 6.3 oz (72.3 kg)  07/02/18 156 lb 4.9 oz (70.9 kg)      Studies/Labs Reviewed:   EKG:  EKG is ordered today.  The ekg ordered today demonstrates sinus tach with TWI in lateral leads.  Recent Labs: 10/15/2018: Magnesium 2.2 10/25/2018: ALT 58; BUN 43; Creatinine, Ser 7.92; Hemoglobin 11.0; Platelets 654; Potassium 4.1; Sodium 134   Lipid Panel    Component Value  Date/Time   CHOL 224 (H) 06/23/2009 2155   TRIG 407 (H) 06/23/2009 2155   HDL 30 (L) 06/23/2009 2155   CHOLHDL 7.5 Ratio 06/23/2009 2155   VLDL NOT CALC mg/dL 06/23/2009 2155   [place] See Comment mg/dL 06/23/2009 2155     Additional studies/ records that were reviewed today include:   Echo 10/15/2018 LV EF: 60% -   65%  ------------------------------------------------------------------- Indications:      Chest pain 786.51.  ------------------------------------------------------------------- History:   Risk factors:  End stage renal disease. Hypertension. Diabetes mellitus.  ------------------------------------------------------------------- Study Conclusions  - Left ventricle: The cavity size was normal. Wall thickness was   increased in a pattern of mild LVH. Systolic function was normal.   The estimated ejection fraction was in the range of 60% to 65%.   Wall motion was normal; there were no regional wall motion   abnormalities. Doppler parameters are consistent with abnormal   left ventricular relaxation (grade 1 diastolic dysfunction). - Aortic valve: Mildly calcified annulus. Trileaflet; mildly   calcified leaflets. - Mitral valve: Mildly calcified annulus. There was trivial   regurgitation. - Right atrium: Central venous pressure (est): 3 mm Hg. - Tricuspid valve: There was trivial regurgitation. - Pulmonary arteries: Systolic pressure could not be accurately   estimated. - Pericardium, extracardiac: A small pericardial effusion was   identified circumferential to the heart with signs of   organization versus chronicity.   Cath 46/50/3546  LV end diastolic pressure is normal.  There is no aortic valve stenosis.  Ost 1st Diag lesion is 40% stenosed.  Prox LAD lesion is 40% stenosed with 50% stenosed side branch in Ost 2nd Diag.   SUMMARY  Mild to moderate LAD and diagonal disease, but no significant stenosis.  Low to normal LVEDP.  Normal EF by echo.   RECOMMENDATIONS  Return to nursing care for monitoring post cath.  Medical management for possible microvascular disease.  Recommend Aspirin 81mg  daily for moderate CAD.     ASSESSMENT:    1. Coronary artery  disease involving native coronary artery of native heart without angina pectoris   2. Generalized abdominal pain   3. Mixed hyperlipidemia   4. ESRD on dialysis ([place])   5. Essential hypertension   6. Controlled type 2 diabetes mellitus without complication, without long-term current use of insulin (HCC)      PLAN:  In order of problems listed above:  1. CAD: On aspirin and Lipitor.  Obtain fasting lipid panel and LFT in 6 weeks  2. Generalized abdominal pain: This is noncardiac in nature.  Worst area of abdominal pain is in his epigastric area, but it also involves the right upper quadrant and the left upper quadrant abdomen.  Lipase level was negative during the recent hospitalization.  CT was negative for acute process.  I will obtain H. pylori breath test  3. Hypertension: Blood pressure stable on current medication  4. Hyperlipidemia: On Lipitor 10 mg daily, fasting lipid panel and LFT in 6 weeks  5. DM2: Managed by primary care provider  6. End-stage renal disease on dialysis: Managed by nephrology.    Medication Adjustments/Labs and Tests Ordered: Current medicines are reviewed at length with the patient today.  Concerns regarding medicines are outlined above.  Medication changes, Labs and Tests ordered today are listed in the Patient Instructions below. Patient Instructions  Medication Instructions:  NO CHANGE If you need a refill on your cardiac medications before your next appointment, please call your pharmacy.   Lab  work: Naval architect recommends that you return for lab work WHEN CONVENIENT =PRIOR TO EATING IN [place] physician recommends that you return for lab work FIRST OF NEXT WEEK If you have labs (blood work) drawn today and your tests are completely normal, you will receive your results only by: Marland Kitchen MyChart Message (if you have MyChart) OR . A paper copy in the mail If you have any lab test that is abnormal or we need to change your treatment, we will call  you to review the results.  Follow-Up: At [facility], you and your health needs are our priority.  As part of our continuing mission to provide you with exceptional heart care, we have created designated Provider Care Teams.  These Care Teams include your primary Cardiologist (physician) and Advanced Practice Providers (APPs -  Physician Assistants and Nurse Practitioners) who all work together to provide you with the care you need, when you need it.  Your physician recommends that you schedule a follow-up appointment in: [place]      Signed, [place], [place]  12/17/2018 11:52 PM    [address], [place], [place]  [address] Phone: [phone]; Fax: [phone]

## 2018-12-17 ENCOUNTER — Encounter: Payer: Self-pay | Admitting: Physician Assistant

## 2018-12-19 LAB — H. PYLORI BREATH TEST: H PYLORI BREATH TEST: POSITIVE — AB

## 2018-12-20 ENCOUNTER — Telehealth: Payer: Self-pay | Admitting: Physician Assistant

## 2018-12-20 ENCOUNTER — Other Ambulatory Visit: Payer: Self-pay

## 2018-12-20 ENCOUNTER — Other Ambulatory Visit: Payer: Self-pay | Admitting: Physician Assistant

## 2018-12-20 DIAGNOSIS — A048 Other specified bacterial intestinal infections: Secondary | ICD-10-CM

## 2018-12-20 MED ORDER — PANTOPRAZOLE SODIUM 40 MG PO TBEC: 40.0000 mg | DELAYED_RELEASE_TABLET | Freq: Two times a day (BID) | ORAL | 0 refills | Status: DC

## 2018-12-20 MED ORDER — CLARITHROMYCIN 500 MG PO TABS: 500.0000 mg | ORAL_TABLET | Freq: Two times a day (BID) | ORAL | 0 refills | Status: AC

## 2018-12-20 MED ORDER — AMOXICILLIN 500 MG PO CAPS: 1000.0000 mg | ORAL_CAPSULE | Freq: Two times a day (BID) | ORAL | 0 refills | Status: AC

## 2018-12-20 NOTE — Telephone Encounter (Signed)
I have spoken with the patient over the phone with help of Merwyn Katos (ID [phone]). He denies any allergy toward medication. He will be on triple therapy for H Pylori. He understands we have placed a referral to Dr. Paulita Fujita for further management. Will defer to Dr. Paulita Fujita to decide whether to do endoscopy. When asked about whether he has a GI physician, he cannot recall the name of his previous GI physician but prefer to see Dr. Paulita Fujita.   I will defer the confirmation test to GI service, he is aware the triple therapy only has a success rate of about 70-80% and needs a confirmation test later to confirm the eradication of H pylori, if he fails the triple therapy, may need quad therapy later.   Hilbert Corrigan PA Pager: [phone]

## 2018-12-20 NOTE — Telephone Encounter (Signed)
Attempt to contact [name] without success. Answer machine is not set up to leave voice mail.   Hilbert Corrigan PA Pager: [phone]

## 2019-01-15 LAB — LIPID PANEL
CHOLESTEROL TOTAL: 201 mg/dL — AB (ref 100–199)
Chol/HDL Ratio: 4.7 ratio (ref 0.0–5.0)
HDL: 43 mg/dL (ref >39)
LDL CALC: 119 mg/dL — AB (ref 0–99)
Triglycerides: 194 mg/dL — ABNORMAL HIGH (ref 0–149)
VLDL CHOLESTEROL CAL: 39 mg/dL (ref 5–40)

## 2019-01-15 LAB — HEPATIC FUNCTION PANEL
ALK PHOS: 104 IU/L (ref 39–117)
ALT: 17 IU/L (ref 0–44)
AST: 11 IU/L (ref 0–40)
Albumin: 4.2 g/dL (ref 3.8–4.9)
BILIRUBIN TOTAL: 0.2 mg/dL (ref 0.0–1.2)
Bilirubin, Direct: 0.1 mg/dL (ref 0.00–0.40)
Total Protein: 6.8 g/dL (ref 6.0–8.5)

## 2019-01-16 ENCOUNTER — Other Ambulatory Visit: Payer: Self-pay

## 2019-01-16 MED ORDER — ATORVASTATIN CALCIUM 40 MG PO TABS: 40.0000 mg | ORAL_TABLET | Freq: Every day | ORAL | 2 refills | Status: DC

## 2019-01-16 NOTE — Progress Notes (Signed)
With the help of [place] [phone] the patient was  notified of the result and verbalized understanding.  All questions (if any) were answered. A new Rx for the Atorvastatin was sent to the patient's pharmacy. Jacqulynn Cadet, [place] 01/16/2019 5:01 PM

## 2019-01-17 ENCOUNTER — Other Ambulatory Visit: Payer: Self-pay

## 2019-01-17 DIAGNOSIS — E782 Mixed hyperlipidemia: Secondary | ICD-10-CM

## 2019-03-11 IMAGING — CT CT ABD-PELV W/O CM
2 of 4 series · 16 of 46 positions shown, 18 images · non-contrast
Comparison: 10/13/2016 renal ultrasound.  No prior CT.

CLINICAL DATA: Left lower quadrant pain. Pertinent pressure for 7
days. Nausea vomiting and fatigue for 7 days. Diverticulitis
suspected.

EXAM:
CT ABDOMEN AND PELVIS WITHOUT CONTRAST
TECHNIQUE: Multidetector CT imaging of the abdomen and pelvis was performed
following the standard protocol without IV contrast.

[Series 2: axial st · axial · 0.81mm/px · z∈[-710,-330]mm · 13 of 88 slices shown, 15 images]
[im 6/88  soft-tissue]
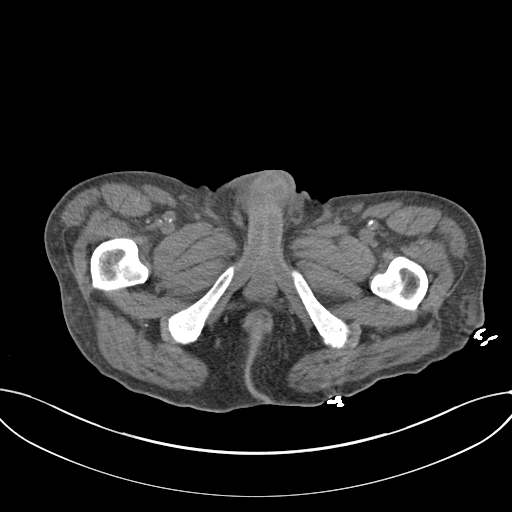
[im 6/88  bone]
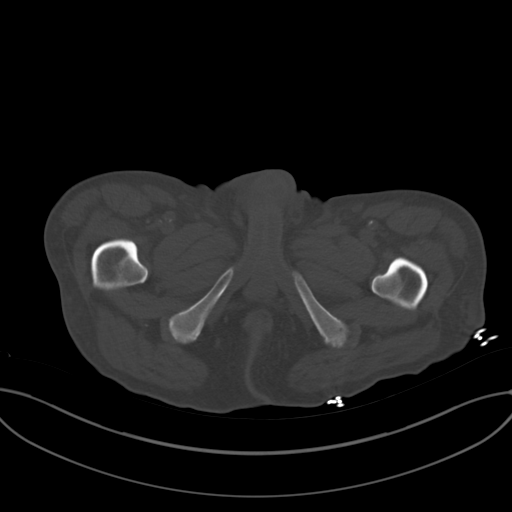
[im 11/88  soft-tissue]
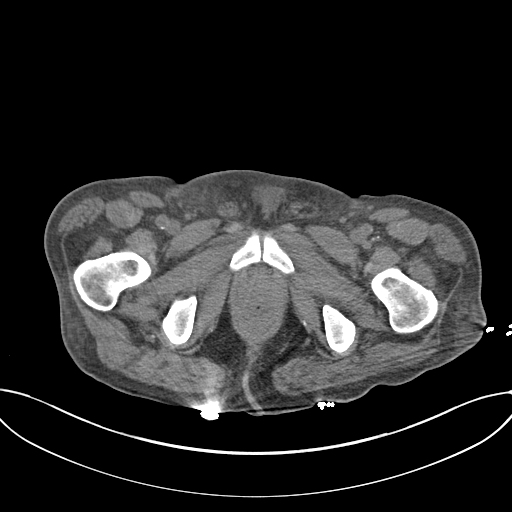
[im 21/88  soft-tissue]
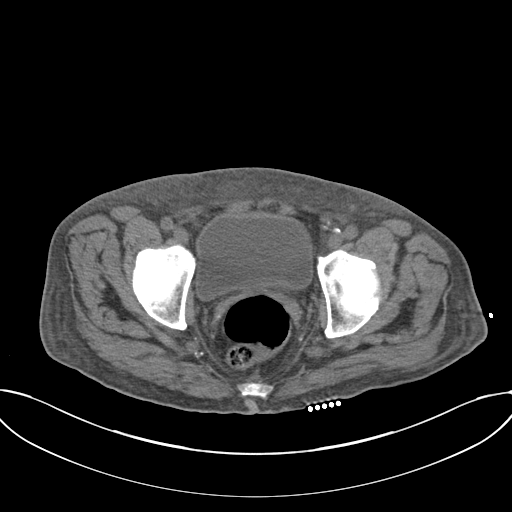
[im 26/88  soft-tissue]
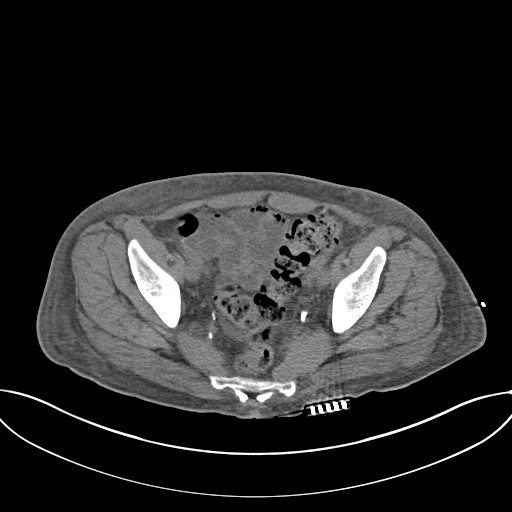
[im 31/88  soft-tissue]
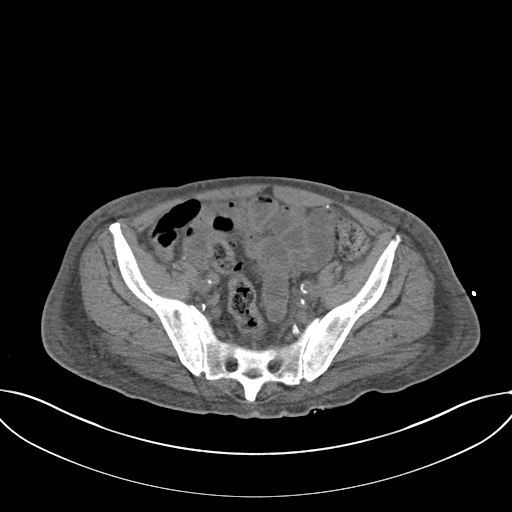
[im 36/88  soft-tissue]
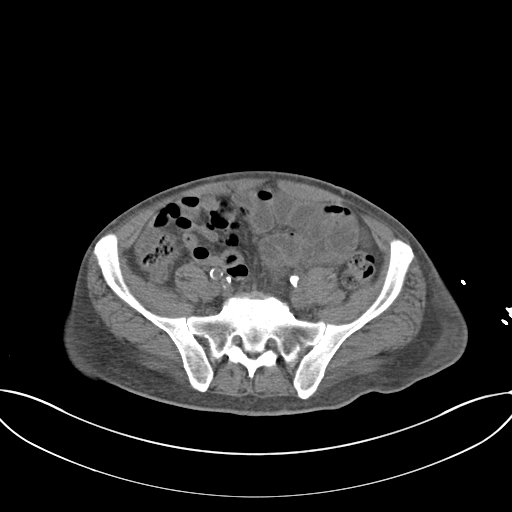
[im 47/88  soft-tissue]
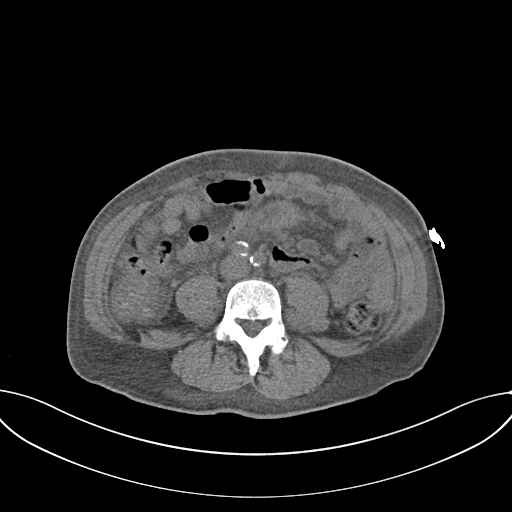
[im 52/88  soft-tissue]
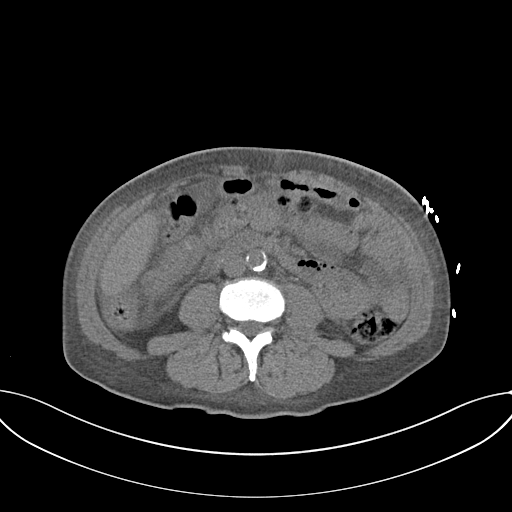
[im 57/88  soft-tissue]
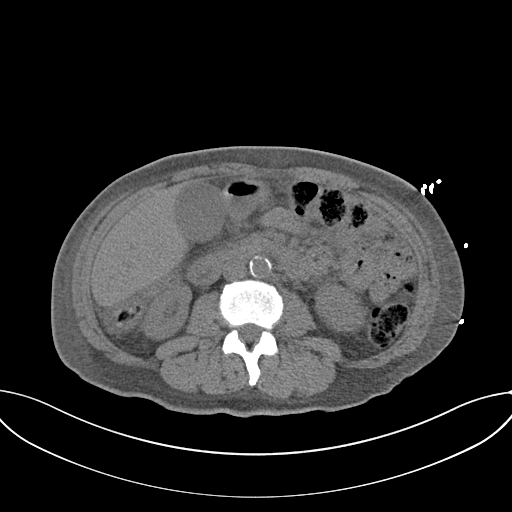
[im 57/88  bone]
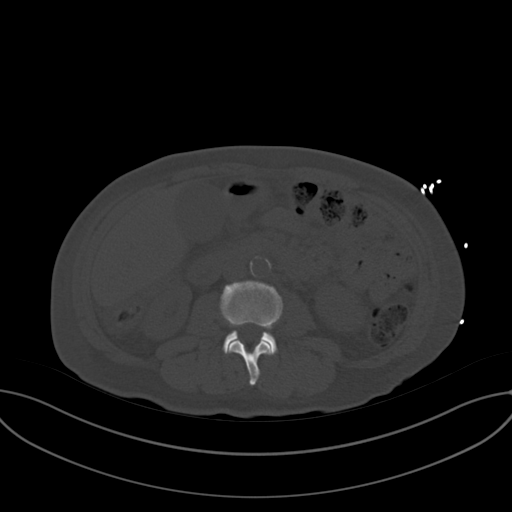
[im 62/88  soft-tissue]
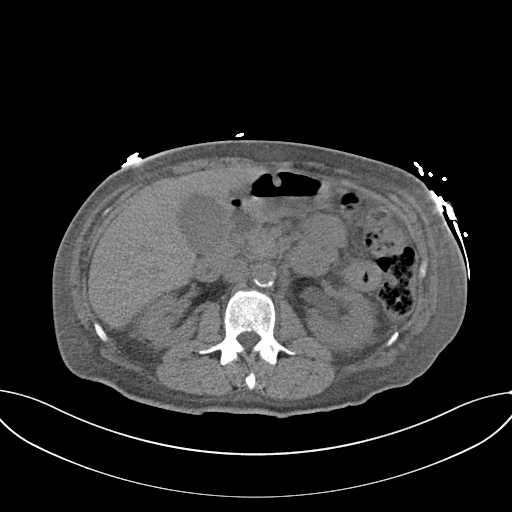
[im 67/88  soft-tissue]
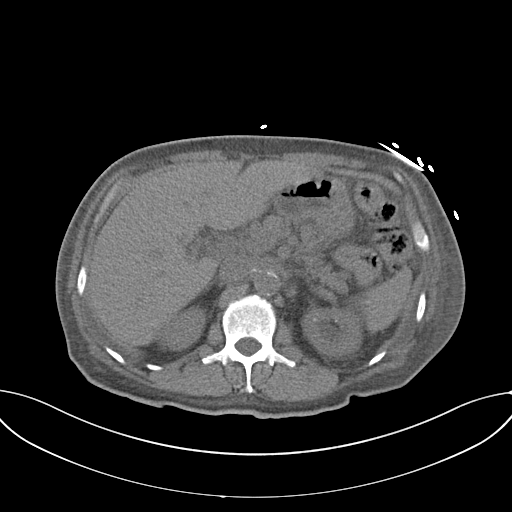
[im 77/88  soft-tissue]
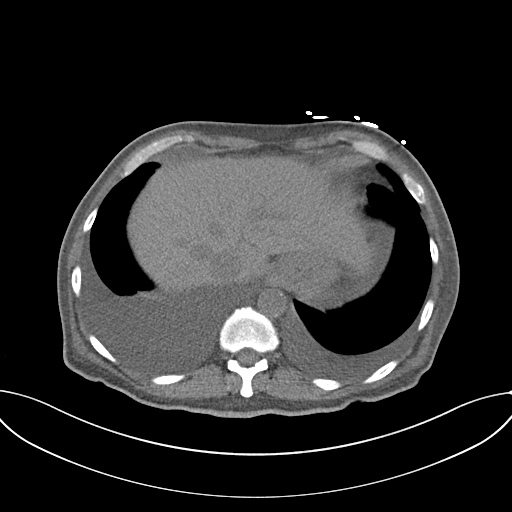
[im 82/88  soft-tissue]
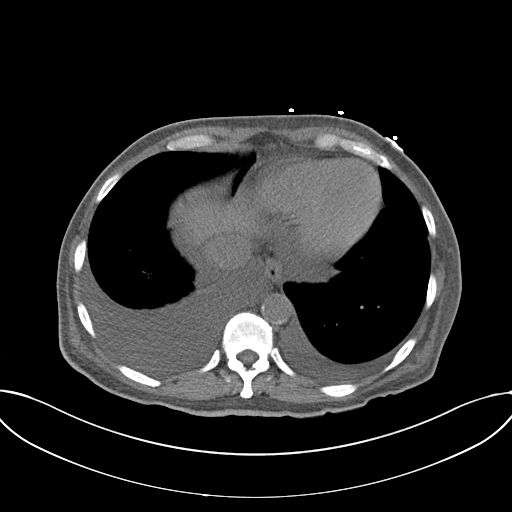

[Series 4: coronal st · coronal · 0.98mm/px · 3 of 76 slices shown]
[im 26/76  soft-tissue]
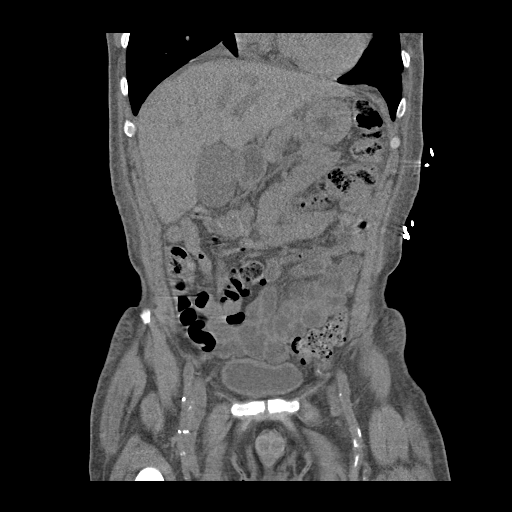
[im 34/76  soft-tissue]
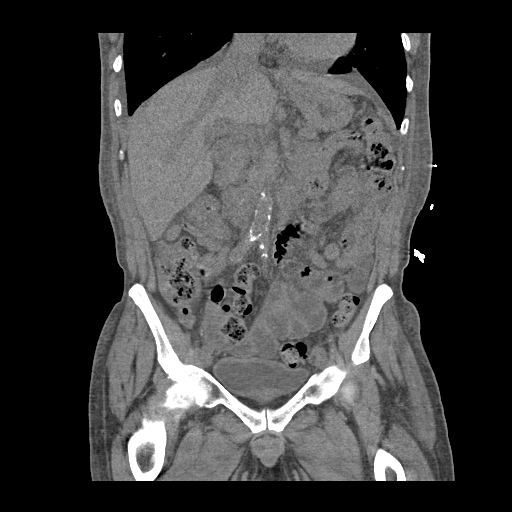
[im 42/76  soft-tissue]
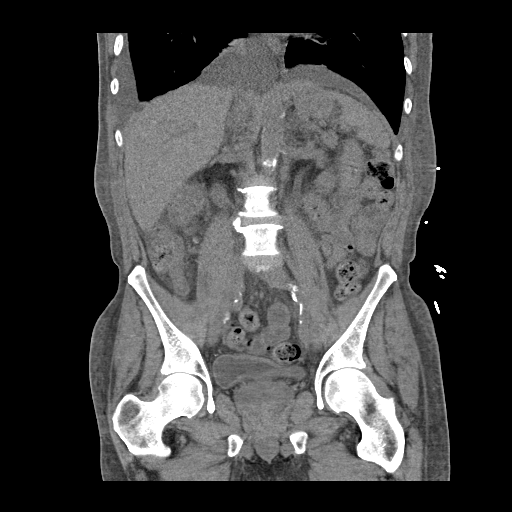

[16 of 46 positions shown; findings below may reference images not displayed]

FINDINGS: Lower chest: Clear lung bases. Small right larger than left pleural
effusions. Borderline cardiomegaly.

Hepatobiliary: Normal liver. Normal gallbladder, without biliary
ductal dilatation.

Pancreas: Normal, without mass or ductal dilatation.

Spleen: Normal in size, without focal abnormality.

Adrenals/Urinary Tract: Normal adrenal glands. Bilateral renal
cortical thinning. Bilateral renal vascular calcifications. Possible
concurrent bilateral collecting system calculi. Mild left-sided
hydroureteronephrosis, followed to the level of a distal left
ureteric 4 mm stone on image 66/series 2. No bladder calculi.

Stomach/Bowel: Proximal gastric underdistention. Sigmoid colon
diverticulosis. Ascending colonic underdistention. Apparent wall
thickening is at least partially felt to be secondary. Example image
41/series 2. Normal terminal ileum. Normal small bowel.

Vascular/Lymphatic: Aortic and branch vessel atherosclerosis. No
abdominopelvic adenopathy.

Reproductive: Mild prostatomegaly.

Other: Trace cul-de-sac fluid.  Anasarca.

Musculoskeletal: Degenerative disc disease at the lumbosacral
junction.
IMPRESSION: 1. Mild left-sided urinary tract obstruction secondary to a distal
left ureteric calculus.
2. Ascending colonic underdistention. Apparent wall thickening is at
least partially secondary. Cannot exclude concurrent colitis.
3. Bilateral pleural effusions and small volume pelvic fluid.
Question fluid overload.
4.  Aortic Atherosclerosis ([id number]-KR2.2).
5. Limited exam secondary to lack of oral or IV contrast.

## 2019-03-11 IMAGING — CR DG CHEST 2V
2 series · 2 of 2 positions shown · non-contrast
Comparison: October 12, 2016

CLINICAL DATA: Left upper quadrant pain and pressure for 7 days

EXAM:
CHEST  2 VIEW

[w chest lat]
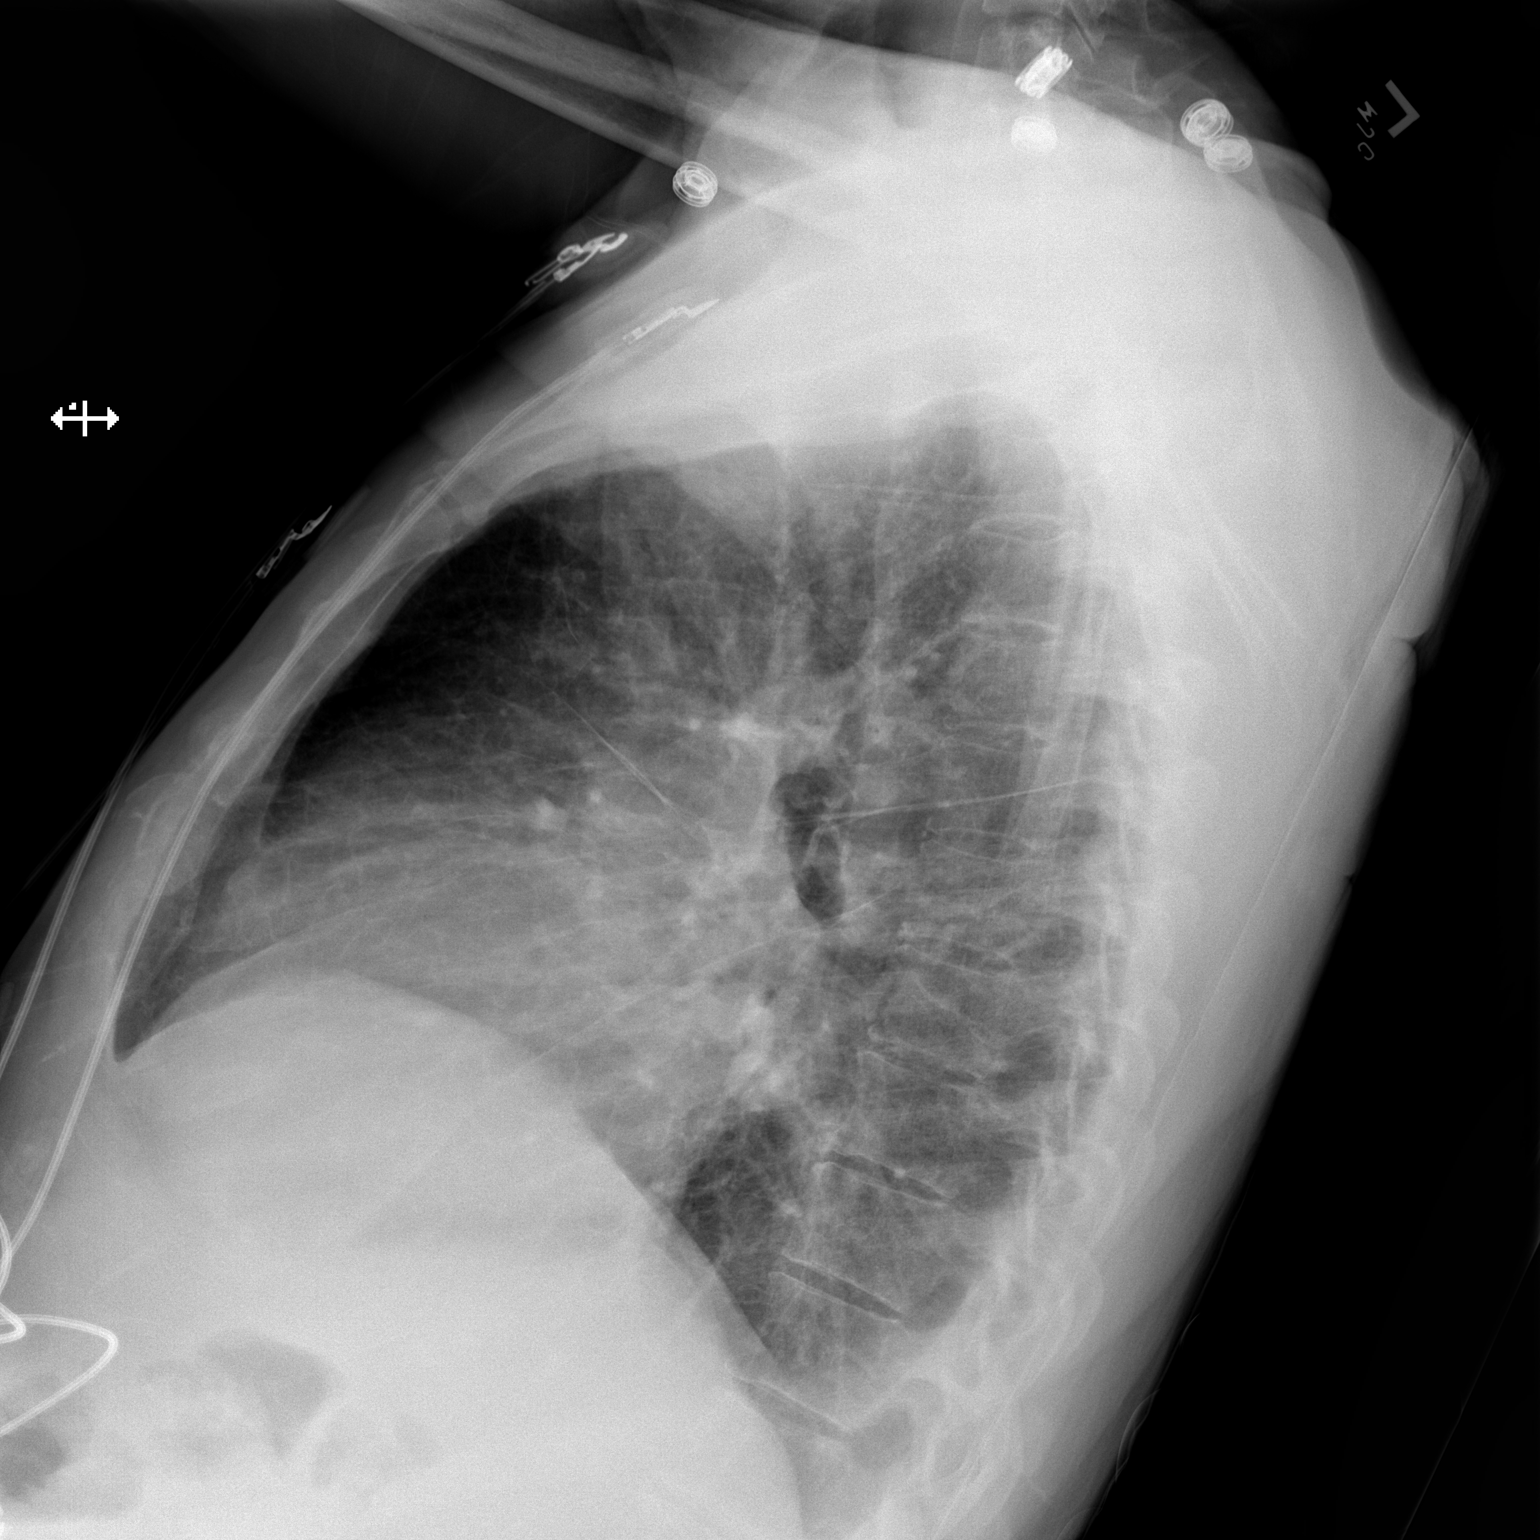

[x chest ap]
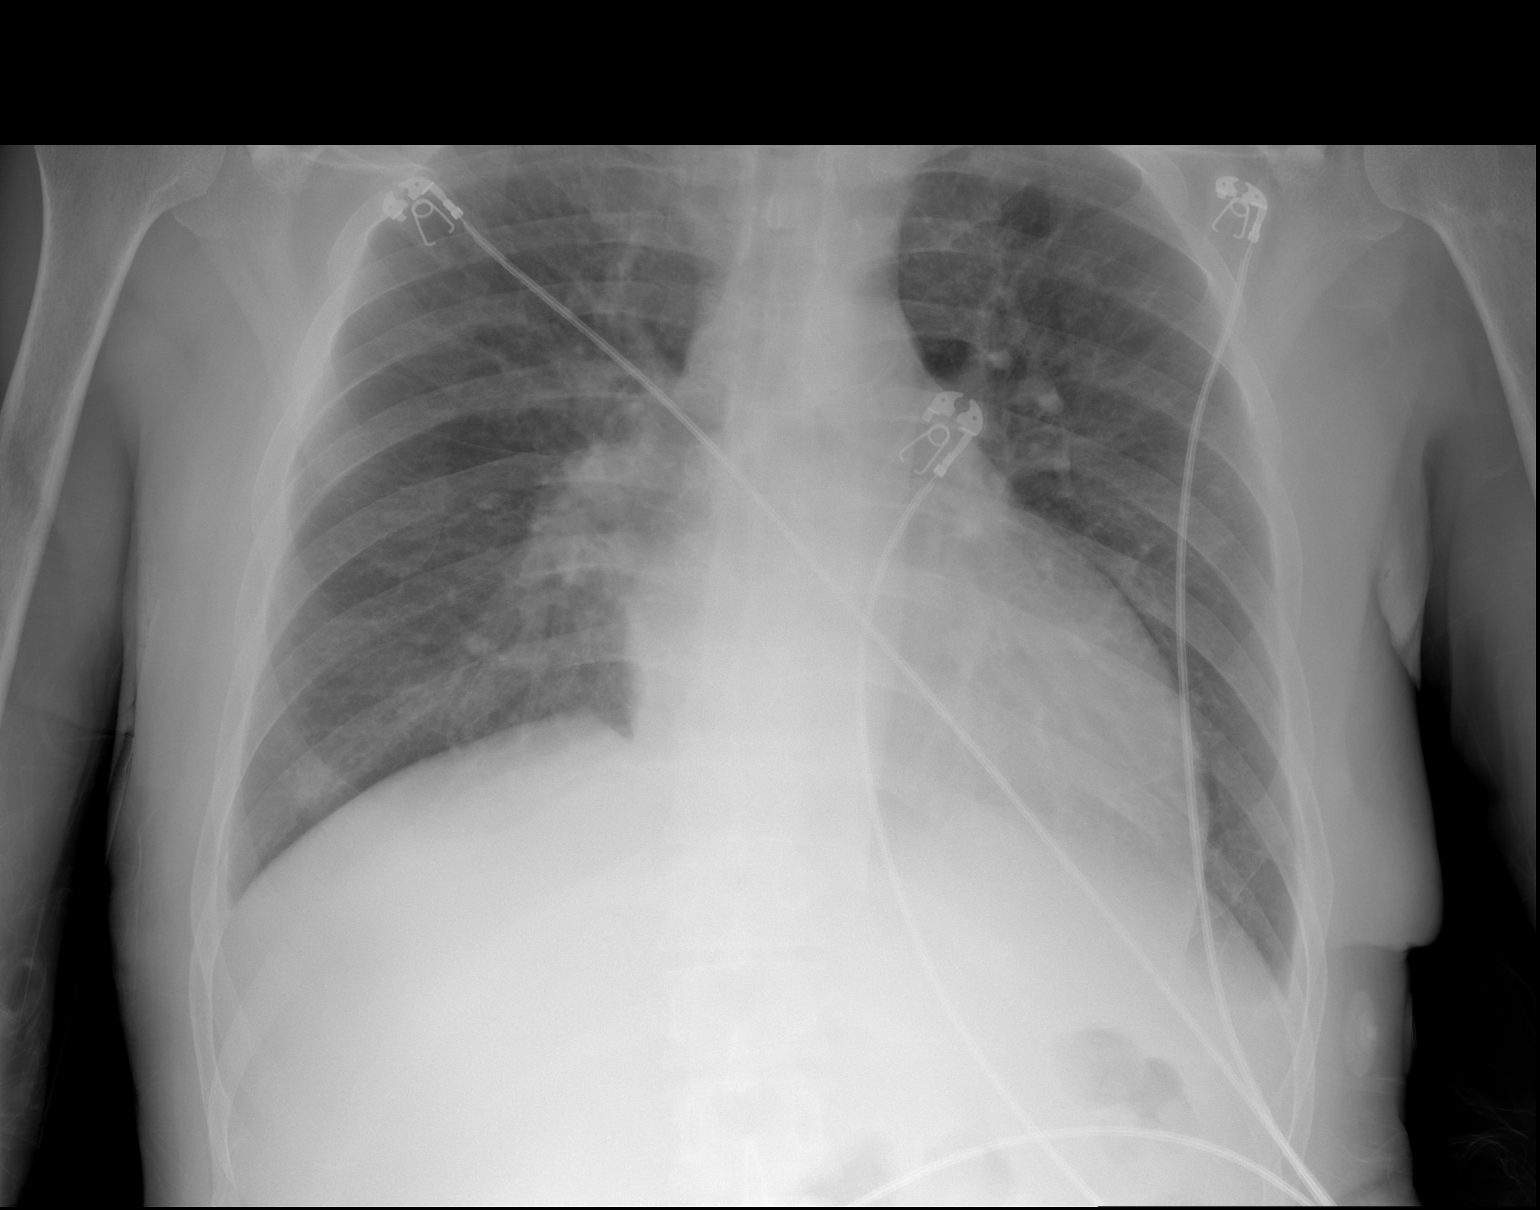

[2 of 2 positions shown; findings below may reference images not displayed]

FINDINGS: The heart size and mediastinal contours are within normal limits.
Mild central pulmonary vascular congestion is identified. No frank
pulmonary edema. Minimal left pleural effusion noted. No focal
pneumonia is identified. The visualized skeletal structures are
stable.
IMPRESSION: Mild central pulmonary vascular congestion without frank pulmonary
edema. Minimal left pleural effusion.

## 2019-05-16 ENCOUNTER — Ambulatory Visit: Payer: Self-pay | Admitting: Cardiology

## 2019-06-25 ENCOUNTER — Other Ambulatory Visit (HOSPITAL_COMMUNITY): Payer: Self-pay | Admitting: Nurse Practitioner

## 2019-06-25 DIAGNOSIS — N186 End stage renal disease: Secondary | ICD-10-CM

## 2019-06-28 ENCOUNTER — Other Ambulatory Visit: Payer: Self-pay | Admitting: Radiology

## 2019-06-29 ENCOUNTER — Ambulatory Visit (HOSPITAL_COMMUNITY)
Admission: RE | Admit: 2019-06-29 | Discharge: 2019-06-29 | Disposition: A | Discharge status: Home / Self Care | Payer: Self-pay | Source: Ambulatory Visit | Attending: Nurse Practitioner | Admitting: Nurse Practitioner

## 2019-06-29 ENCOUNTER — Encounter (HOSPITAL_COMMUNITY): Payer: Self-pay

## 2019-06-29 ENCOUNTER — Other Ambulatory Visit (HOSPITAL_COMMUNITY): Payer: Self-pay | Admitting: Nurse Practitioner

## 2019-06-29 ENCOUNTER — Other Ambulatory Visit: Payer: Self-pay

## 2019-06-29 DIAGNOSIS — N186 End stage renal disease: Secondary | ICD-10-CM | POA: Insufficient documentation

## 2019-06-29 DIAGNOSIS — E785 Hyperlipidemia, unspecified: Secondary | ICD-10-CM | POA: Insufficient documentation

## 2019-06-29 DIAGNOSIS — Z7982 Long term (current) use of aspirin: Secondary | ICD-10-CM | POA: Insufficient documentation

## 2019-06-29 DIAGNOSIS — I2510 Atherosclerotic heart disease of native coronary artery without angina pectoris: Secondary | ICD-10-CM | POA: Insufficient documentation

## 2019-06-29 DIAGNOSIS — T82858A Stenosis of vascular prosthetic devices, implants and grafts, initial encounter: Secondary | ICD-10-CM | POA: Insufficient documentation

## 2019-06-29 DIAGNOSIS — I120 Hypertensive chronic kidney disease with stage 5 chronic kidney disease or end stage renal disease: Secondary | ICD-10-CM | POA: Insufficient documentation

## 2019-06-29 DIAGNOSIS — E1122 Type 2 diabetes mellitus with diabetic chronic kidney disease: Secondary | ICD-10-CM | POA: Insufficient documentation

## 2019-06-29 DIAGNOSIS — Y841 Kidney dialysis as the cause of abnormal reaction of the patient, or of later complication, without mention of misadventure at the time of the procedure: Secondary | ICD-10-CM | POA: Insufficient documentation

## 2019-06-29 DIAGNOSIS — Z87891 Personal history of nicotine dependence: Secondary | ICD-10-CM | POA: Insufficient documentation

## 2019-06-29 DIAGNOSIS — Z79899 Other long term (current) drug therapy: Secondary | ICD-10-CM | POA: Insufficient documentation

## 2019-06-29 HISTORY — PX: IR AV DIALY SHUNT INTRO NEEDLE/INTRACATH INITIAL W/PTA/IMG RIGHT: IMG6116

## 2019-06-29 HISTORY — PX: IR US GUIDE VASC ACCESS RIGHT: IMG2390

## 2019-06-29 LAB — CBC WITH DIFFERENTIAL/PLATELET
Abs Immature Granulocytes: 0.07 10*3/uL (ref 0.00–0.07)
Basophils Absolute: 0.1 10*3/uL (ref 0.0–0.1)
Basophils Relative: 1 %
Eosinophils Absolute: 0.3 10*3/uL (ref 0.0–0.5)
Eosinophils Relative: 3 %
HCT: 53.4 % — ABNORMAL HIGH (ref 39.0–52.0)
Hemoglobin: 16.5 g/dL (ref 13.0–17.0)
Immature Granulocytes: 1 %
Lymphocytes Relative: 26 %
Lymphs Abs: 2.5 10*3/uL (ref 0.7–4.0)
MCH: 28.8 pg (ref 26.0–34.0)
MCHC: 30.9 g/dL (ref 30.0–36.0)
MCV: 93.4 fL (ref 80.0–100.0)
Monocytes Absolute: 0.8 10*3/uL (ref 0.1–1.0)
Monocytes Relative: 8 %
Neutro Abs: 6.1 10*3/uL (ref 1.7–7.7)
Neutrophils Relative %: 61 %
Platelets: 366 10*3/uL (ref 150–400)
RBC: 5.72 MIL/uL (ref 4.22–5.81)
RDW: 15 % (ref 11.5–15.5)
WBC: 9.9 10*3/uL (ref 4.0–10.5)
nRBC: 0 % (ref 0.0–0.2)

## 2019-06-29 LAB — BASIC METABOLIC PANEL
Anion gap: 18 — ABNORMAL HIGH (ref 5–15)
BUN: 44 mg/dL — ABNORMAL HIGH (ref 6–20)
CO2: 27 mmol/L (ref 22–32)
Calcium: 9.9 mg/dL (ref 8.9–10.3)
Chloride: 96 mmol/L — ABNORMAL LOW (ref 98–111)
Creatinine, Ser: 7.89 mg/dL — ABNORMAL HIGH (ref 0.61–1.24)
GFR calc Af Amer: 8 mL/min — ABNORMAL LOW (ref >60)
GFR calc non Af Amer: 7 mL/min — ABNORMAL LOW (ref >60)
Glucose, Bld: 146 mg/dL — ABNORMAL HIGH (ref 70–99)
Potassium: 4.6 mmol/L (ref 3.5–5.1)
Sodium: 141 mmol/L (ref 135–145)

## 2019-06-29 LAB — GLUCOSE, CAPILLARY: Glucose-Capillary: 169 mg/dL — ABNORMAL HIGH (ref 70–99)

## 2019-06-29 LAB — PROTIME-INR
INR: 1.1 (ref 0.8–1.2)
Prothrombin Time: 13.7 seconds (ref 11.4–15.2)

## 2019-06-29 MED ORDER — FENTANYL CITRATE (PF) 100 MCG/2ML IJ SOLN
INTRAMUSCULAR | Status: AC | PRN
  Administered 2019-06-29: 25 ug via INTRAVENOUS
  Administered 2019-06-29 (×2): 12.5 ug via INTRAVENOUS
  Administered 2019-06-29: 25 ug via INTRAVENOUS

## 2019-06-29 MED ORDER — MIDAZOLAM HCL 2 MG/2ML IJ SOLN
INTRAMUSCULAR | Status: AC | PRN
  Administered 2019-06-29 (×3): 0.5 mg via INTRAVENOUS

## 2019-06-29 MED ORDER — SODIUM CHLORIDE 0.9 % IV SOLN: INTRAVENOUS | Status: DC

## 2019-06-29 MED ORDER — IOHEXOL 300 MG/ML  SOLN: 50.0000 mL | Freq: Once | INTRAMUSCULAR | Status: DC | PRN

## 2019-06-29 MED ORDER — MIDAZOLAM HCL 2 MG/2ML IJ SOLN
INTRAMUSCULAR | Status: AC
  Filled 2019-06-29: qty 2

## 2019-06-29 MED ORDER — LIDOCAINE HCL (PF) 1 % IJ SOLN
INTRAMUSCULAR | Status: AC | PRN
  Administered 2019-06-29: 5 mL

## 2019-06-29 MED ORDER — LIDOCAINE HCL 1 % IJ SOLN
INTRAMUSCULAR | Status: AC
  Filled 2019-06-29: qty 20

## 2019-06-29 MED ORDER — FENTANYL CITRATE (PF) 100 MCG/2ML IJ SOLN
INTRAMUSCULAR | Status: AC
  Filled 2019-06-29: qty 2

## 2019-06-29 NOTE — Procedures (Signed)
Interventional Radiology Procedure Note  Procedure: US guided access RUE radiocephalic fistula x 2.  Fistulagram. Treatment of AV anastamosis narrowing, with 60mm and 71mm plasty.  Findings: Improved flow and palpable thrill. <30% residual stenosis.  .  Complications: None  Recommendations:  - Ok to use - Remove suture at next dialysis session - Routine care   Signed,  Dulcy Fanny. Earleen Newport, DO

## 2019-06-29 NOTE — H&P (Signed)
Chief Complaint: Patient was seen in consultation today for left arm dialysis fistula evaluation and possible intervention at the request of Valentina Gu  Referring Physician(s): Valentina Gu  Supervising Physician: Corrie Mckusick  Patient Status: [facility] - Out-pt  History of Present Illness: Fernando Jones is a 58 y.o. male   Left arm fistula placed 12/2017 IR intervention 04/2018 Thrombectomy in OR 06/2018 Did well until just 4 weeks ago Now has unusual sound/bruit per notes Slow flow-- pt gaining wt each dialysis  Does complete dialysis-- yesterday last session  Now request for evaluation with possible intervention   Past Medical History:  Diagnosis Date  . Anemia   . Erectile dysfunction 2012  . ESRD (end stage renal disease) ([place])    w/Left ureteral stone/hydronephrosis/notes 12/06/2017  . Hepatitis 1973   "? kind"  . Hyperlipidemia 2012  . Hypertension   . Leukocytosis   . Mild CAD    a. by cath 10/2018.  Marland Kitchen Pericardial effusion    a. small by echo 10/2018.  . Pulmonary nodule    a. by CT 10/2018.  . Tobacco abuse 2012   1/2 pack per day  . Type II diabetes mellitus ([place])     Past Surgical History:  Procedure Laterality Date  . APPENDECTOMY    . AV FISTULA PLACEMENT Right 12/09/2017   Procedure: ARTERIOVENOUS (AV) FISTULA CREATION;  Surgeon: Rosetta Posner, MD;  Location: [place];  Service: Vascular;  Laterality: Right;  . CARDIAC CATHETERIZATION  05/12/2009   Archie Endo 04/07/2011  . CYSTOSCOPY/URETEROSCOPY/HOLMIUM LASER/STENT PLACEMENT Left 12/08/2017   Procedure: CYSTOSCOPY/RETROGRADE PYLEOGRAM LEFT URETEROSCOPY AND STONE EXTRACTION;  Surgeon: Festus Aloe, MD;  Location: [facility];  Service: Urology;  Laterality: Left;  . HOLMIUM LASER APPLICATION Left 05/10/353   Procedure: HOLMIUM LASER APPLICATION;  Surgeon: Festus Aloe, MD;  Location: [facility];  Service: Urology;  Laterality: Left;  . INSERTION OF DIALYSIS CATHETER Right 12/09/2017   Procedure: EXCHANGE  OF TUNNELED  DIALYSIS CATHETER RIGHT INTERNAL JUGULAR.;  Surgeon: Rosetta Posner, MD;  Location: [place];  Service: Vascular;  Laterality: Right;  . IR AV DIALY SHUNT INTRO NEEDLE/INTRACATH INITIAL W/PTA/IMG RIGHT Right 05/05/2018  . IR FLUORO GUIDE CV LINE RIGHT  12/07/2017  . IR REMOVAL TUN CV CATH W/O FL  06/05/2018  . IR US GUIDE VASC ACCESS RIGHT  12/07/2017  . IR US GUIDE VASC ACCESS RIGHT  05/05/2018  . LEFT HEART CATH AND CORONARY ANGIOGRAPHY N/A 10/17/2018   Procedure: LEFT HEART CATH AND CORONARY ANGIOGRAPHY;  Surgeon: Leonie Man, MD;  Location: [place] [facility];  Service: Cardiovascular;  Laterality: N/A;  . THROMBECTOMY W/ EMBOLECTOMY Right 06/30/2018   Procedure: THROMBECTOMY and revision ARTERIOVENOUS FISTULA right RADIOCEPHALIC;  Surgeon: Angelia Mould, MD;  Location: [place];  Service: Vascular;  Laterality: Right;  . ULTRASOUND GUIDANCE FOR VASCULAR ACCESS  10/17/2018   Procedure: Ultrasound Guidance For Vascular Access;  Surgeon: Leonie Man, MD;  Location: [place] [facility];  Service: Cardiovascular;;    Allergies: Patient has no known allergies.  Medications: Prior to Admission medications   Medication Sig Start Date End Date Taking? Authorizing Provider  aspirin EC 81 MG EC tablet Take 1 tablet (81 mg total) by mouth daily. 12/17/17   Bonnell Public, MD  atorvastatin (LIPITOR) 40 MG tablet Take 1 tablet (40 mg total) by mouth at bedtime. 01/16/19   Almyra Deforest, PA  dicyclomine (BENTYL) 20 MG tablet Take 1 tablet (  20 mg total) by mouth 3 (three) times daily before meals. 10/25/18   Rai, Vernelle Emerald, MD  ferric citrate (AURYXIA) 1 GM 210 MG(Fe) tablet Take 1 tablet (210 mg total) by mouth 3 (three) times daily with meals. 07/02/18   Kayleen Memos, DO  ondansetron (ZOFRAN) 4 MG tablet Take 1 tablet (4 mg total) by mouth every 6 (six) hours as needed for nausea. 10/25/18   Rai, Ripudeep K, MD  pantoprazole (PROTONIX) 40 MG tablet Take 1 tablet  (40 mg total) by mouth 2 (two) times daily for 14 days. 12/20/18 01/03/19  Almyra Deforest, PA  ranitidine (ZANTAC) 150 MG tablet Take 150 mg by mouth daily. 05/24/18   [provider]  rOPINIRole (REQUIP) 0.25 MG tablet Take 0.25-0.5 mg by mouth at bedtime. 09/29/18   [provider]  tamsulosin (FLOMAX) 0.4 MG CAPS capsule Take 1 capsule (0.4 mg total) by mouth daily. 12/16/17   Bonnell Public, MD     Family History  Problem Relation Age of Onset  . Congestive Heart Failure Mother   . Diabetes Neg Hx     Social History   Socioeconomic History  . Marital status: Divorced    Spouse name: Not on file  . Number of children: Not on file  . Years of education: Not on file  . Highest education level: Not on file  Occupational History  . Not on file  Social Needs  . Financial resource strain: Not on file  . Food insecurity    Worry: Not on file    Inability: Not on file  . Transportation needs    Medical: Not on file    Non-medical: Not on file  Tobacco Use  . Smoking status: Former Smoker    Packs/day: 1.00    Types: Cigarettes    Quit date: 12/06/2016    Years since quitting: 2.5  . Smokeless tobacco: Never Used  Substance and Sexual Activity  . Alcohol use: No  . Drug use: No  . Sexual activity: Not Currently  Lifestyle  . Physical activity    Days per week: Not on file    Minutes per session: Not on file  . Stress: Not on file  Relationships  . Social Herbalist on phone: Not on file    Gets together: Not on file    Attends religious service: Not on file    Active member of club or organization: Not on file    Attends meetings of clubs or organizations: Not on file    Relationship status: Not on file  Other Topics Concern  . Not on file  Social History Narrative  . Not on file    Review of Systems: A 12 point ROS discussed and pertinent positives are indicated in the HPI above.  All other systems are negative.  Review of Systems   Constitutional: Negative for activity change, fatigue and fever.  Respiratory: Negative for cough and shortness of breath.   Cardiovascular: Negative for leg swelling.  Gastrointestinal: Negative for abdominal pain.  Musculoskeletal: Positive for back pain.  Neurological: Negative for weakness.  Psychiatric/Behavioral: Negative for behavioral problems and confusion.    Vital Signs: BP (!) 86/65   Pulse (!) 55   Temp 97.9 F (36.6 C) (Temporal)   Ht 5' 5.75" (1.67 m)   Wt 165 lb 2 oz (74.9 kg)   SpO2 99%   BMI 26.86 kg/m   Physical Exam Vitals signs reviewed.  Cardiovascular:  Rate and Rhythm: Normal rate and regular rhythm.     Heart sounds: Normal heart sounds.  Pulmonary:     Effort: Pulmonary effort is normal.     Breath sounds: Normal breath sounds.  Abdominal:     Tenderness: There is no abdominal tenderness.  Musculoskeletal: Normal range of motion.     Comments: Left lower arm dialysis fistula +thrill; slight pulse  Skin:    General: Skin is warm and dry.  Neurological:     Mental Status: He is alert and oriented to person, place, and time.  Psychiatric:        Mood and Affect: Mood normal.        Behavior: Behavior normal.        Thought Content: Thought content normal.        Judgment: Judgment normal.     Comments: Spanish interpreter in room     Imaging: No results found.  Labs:  CBC: Recent Labs    10/23/18 0318 10/23/18 1226 10/24/18 0449 10/25/18 0925  WBC 13.7* 16.5* 24.1* 16.9*  HGB 11.0* 11.2* 10.2* 11.0*  HCT 35.6* 35.9* 32.8* 36.2*  PLT 481* 518* 575* 654*    COAGS: No results for input(s): INR, APTT in the last 8760 hours.  BMP: Recent Labs    10/21/18 0329 10/23/18 1226 10/24/18 0733 10/25/18 0925  NA 135 132* 131* 134*  K 4.2 4.4 5.0 4.1  CL 95* 92* 92* 95*  CO2 23 26 24 26   GLUCOSE 98 211* 126* 150*  BUN 48* 39* 54* 43*  CALCIUM 9.1 9.5 9.6 9.4  CREATININE 9.12* 9.49* 11.52* 7.92*  GFRNONAA 6* 5* 4* 7*   GFRAA 6* 6* 5* 8*    LIVER FUNCTION TESTS: Recent Labs    10/14/18 1952 10/16/18 0308  10/23/18 1226 10/24/18 0733 10/25/18 0925 01/15/19 0913  BILITOT 0.8 0.6  --   --   --  0.3 0.2  AST 20 14*  --   --   --  25 11  ALT 27 19  --   --   --  58* 17  ALKPHOS 57 60  --   --   --  100 104  PROT 7.8 6.5  --   --   --  6.7 6.8  ALBUMIN 3.7 3.0*   < > 2.8* 2.8* 2.7* 4.2   < > = values in this interval not displayed.    TUMOR MARKERS: No results for input(s): AFPTM, CEA, CA199, CHROMGRNA in the last 8760 hours.  Assessment and Plan:  Left forearm dialysis fistula - slow flow; bruit with unusual sound Scheduled for evaluation and possible intervention Possible tunneled dialysis catheter placement Pt is aware of procedure benefits and risks including but not limited to Infection; bleeding; vessel damage; damage to surrounding structures Agreeable to proceed Consent signed  Thank you for this interesting consult.  I greatly enjoyed meeting [facility] and look forward to participating in their care.  A copy of this report was sent to the requesting provider on this date.  Electronically Signed: Lavonia Drafts, PA-C 06/29/2019, 11:36 AM   I spent a total of  30 Minutes   in face to face in clinical consultation, greater than 50% of which was counseling/coordinating care for left arm dialysis fistula evaluation/intervention

## 2019-06-29 NOTE — Discharge Instructions (Signed)
Fistulografa de dilisis, cuidados posteriores Dialysis Fistulogram, Care After Esta hoja le proporciona informacin sobre cmo cuidarse despus del procedimiento. Su mdico tambin podr darle instrucciones ms especficas. Comunquese con su mdico si tiene problemas o preguntas. Qu puedo esperar despus del procedimiento? Despus del procedimiento, es comn [employer] siguientes sntomas:  Una pequea molestia en la zona en la que se coloc el tubo delgado y pequeo (catter) para el procedimiento.  Un pequeo hematoma alrededor de la fstula.  Somnolencia y cansancio Company secretary). Siga estas indicaciones en su casa: Actividad   Haga reposo en su casa y no levante objetos que pesen ms de 5 lb (2,3 kg) el da despus del procedimiento.  Reanude sus actividades normales segn lo indicado por el mdico. Pregntele al mdico qu actividades son seguras para usted.  No conduzca ni use maquinaria pesada mientras toma analgsicos recetados.  No conduzca durante 24horas si recibi un medicamento para ayudarlo a relajarse (sedante) durante el procedimiento. Medicamentos   [employer] de venta libre y los recetados solamente como se lo haya indicado el mdico. Cuidado del Environmental consultant de la puncin  Siga las indicaciones de su mdico acerca de cmo Government social research officer donde se insertaron los catteres. Asegrese de hacer lo siguiente: ? Lvese las manos con agua y jabn antes de [place]ar[place] manager las vendas (vendaje). Use desinfectante para manos si no dispone de [place]. ? Cambie el vendaje como se lo haya indicado el mdico. ? No retire los puntos (suturas), la goma para cerrar la piel o las tiras [place]. Es posible que estos cierres cutneos Animal nutritionist en la piel durante 2semanas o ms. Si los bordes de las tiras adhesivas empiezan a despegarse y Therapist, sports, puede recortar los que estn sueltos. No retire las tiras [facility] por completo a menos que el mdico se lo indique.  Controle  la zona de la puncin todos los das para detectar signos de infeccin. Est atento a los siguientes signos: ? Dolor, hinchazn o enrojecimiento. ? Lquido o sangre. ? Calor. ? Pus o mal olor. Instrucciones generales  No tome baos de inmersin, no nade ni use el jacuzzi hasta que el mdico lo autorice. Pregntele al mdico si puede ducharse. Thurston Pounds solo le permitan darse baos de [place].  Controle atentamente la fstula de dilisis. Verifique para asegurarse de que puede sentir una vibracin o un zumbido (frmito) cuando Risk manager los dedos sobre la fstula.  Evite daar el injerto o la fstula: ? No use ropa ajustada ni joyas en el brazo o la pierna que tiene el injerto o la fstula. ? Informe a todos sus mdicos que tiene un injerto o una fstula para dilisis. ? No permita extracciones de sangre, terapias intravenosas ni lecturas de la presin arterial en el brazo que tiene la fstula o el injerto. ? No permita que le apliquen vacunas antigripales ni otras vacunas en el brazo con la fstula o el injerto.  Concurra a todas las visitas de seguimiento como se lo haya indicado el mdico. Esto es importante. Comunquese con un mdico si:  Tiene enrojecimiento, hinchazn o dolor en el lugar donde se insert el catter.  Observa lquido o sangre que proviene del lugar de la insercin del catter.  El lugar de la insercin del catter est caliente al tacto.  Tiene pus o percibe mal olor que proviene del lugar de la insercin del catter.  Tiene fiebre o siente escalofros. Solicite ayuda de inmediato si:  Se siente dbil.  Tiene problemas de equilibrio.  Tiene dificultad para mover los brazos o las piernas.  Tiene problemas visuales o para hablar.  Ya no puede sentir una vibracin o un zumbido cuando [employer] dedos sobre la fstula de dilisis.  La extremidad que se [place] para el procedimiento: ? Se hincha. ? Duele. ? Est fra. ? Cambia de color, por ejemplo, se torna  azulada o blanco plido.  Siente falta de aire o Tourist information centre manager. Resumen  Despus de [place] fistulografa de dilisis, es comn tener una pequea molestia o algunos moretones en la zona donde se coloc el tubo delgado y pequeo (catter).  Descanse en su [place] despus del procedimiento. Reanude sus actividades normales segn lo indicado por el mdico.  [employer] de venta libre y los recetados solamente como se lo haya indicado el mdico.  Siga las indicaciones de su mdico acerca de cmo Government social research officer donde se insert el catter.  Concurra a todas las visitas de seguimiento como se lo haya indicado el mdico. Esta informacin no tiene Marine scientist el consejo del mdico. Asegrese de hacerle al mdico cualquier pregunta que tenga. Document Released: 04/08/2014 Document Revised: 01/25/2018 Document Reviewed: 01/25/2018 Elsevier Patient Education  2020 [place] en los adultos, cuidados posteriores (Moderate Conscious Sedation, Adult, Care After) Estas indicaciones le proporcionan informacin acerca de cmo deber cuidarse despus del procedimiento. El mdico tambin podr darle instrucciones ms especficas. El tratamiento ha sido planificado segn las prcticas mdicas actuales, pero en algunos casos pueden ocurrir problemas. Comunquese con el mdico si tiene algn problema o dudas despus del procedimiento. QU ESPERAR DESPUS DEL PROCEDIMIENTO Despus del procedimiento, es comn:  Sentirse somnoliento durante varias horas.  Sentirse torpe y [employer] de equilibrio durante varias horas.  Perder el sentido de la realidad durante varias horas.  Vomitar si come [employer]. INSTRUCCIONES PARA EL CUIDADO EN EL HOGAR  Durante al menos 24horas despus del procedimiento:  No haga lo siguiente: ? Participar en actividades que impliquen posibles cadas o lesiones. ? Conducir vehculos. ? Operar maquinarias  pesadas. ? Beber alcohol. ? Tomar somnferos o medicamentos que causen somnolencia. ? Firmar documentos legales ni tomar [employer]. ? Cuidar a nios por su cuenta.  Hacer reposo. Comida y bebida  Siga la dieta recomendada por el mdico.  Si vomita: ? Pruebe agua, jugo o sopa cuando usted pueda beber sin vomitar. ? Asegrese de no tener nuseas antes de ingerir alimentos slidos. Instrucciones generales  Permanezca con un adulto responsable hasta que est completamente despierto y consciente.  Tome los medicamentos de venta libre y los recetados solamente como se lo haya indicado el mdico.  Si fuma, no lo haga sin supervisin.  Concurra a todas las visitas de control como se lo haya indicado el mdico. Esto es importante. SOLICITE ATENCIN MDICA SI:  Sigue teniendo nuseas o vomitando.  Tiene sensacin de desvanecimiento.  Le aparece una erupcin cutnea.  Tiene fiebre. SOLICITE ATENCIN MDICA DE INMEDIATO SI:  Tiene dificultad para respirar. Esta informacin no tiene Marine scientist el consejo del mdico. Asegrese de hacerle al mdico cualquier pregunta que tenga. Document Released: 11/27/2013 Document Revised: 07/29/2016 Document Reviewed: 03/13/2016 Elsevier Patient Education  2020 [employer].

## 2019-07-27 ENCOUNTER — Other Ambulatory Visit (HOSPITAL_COMMUNITY): Payer: Self-pay | Admitting: Nurse Practitioner

## 2019-07-27 DIAGNOSIS — N186 End stage renal disease: Secondary | ICD-10-CM

## 2019-07-30 ENCOUNTER — Ambulatory Visit (HOSPITAL_COMMUNITY): Admission: RE | Admit: 2019-07-30 | Payer: Self-pay | Source: Ambulatory Visit

## 2019-07-30 ENCOUNTER — Telehealth (HOSPITAL_COMMUNITY): Payer: Self-pay

## 2019-07-30 ENCOUNTER — Encounter (HOSPITAL_COMMUNITY): Payer: Self-pay

## 2019-07-30 NOTE — Telephone Encounter (Signed)
Called pt multiple times to find out if he was coming to appointment today. No answer. Called the kidney center to see if fistulagram was truly needed. Yes, even though he ran dialysis, his numbers were still off. Will cancel and try and call pt to reschedule. Pt stated that he wasn't aware of appointment today, but Selinda Eon confirmed that pt was given appointment information. AW

## 2019-07-31 ENCOUNTER — Other Ambulatory Visit: Payer: Self-pay | Admitting: Physician Assistant

## 2019-08-01 ENCOUNTER — Ambulatory Visit (HOSPITAL_COMMUNITY): Admission: RE | Admit: 2019-08-01 | Payer: Self-pay | Source: Ambulatory Visit

## 2019-08-03 ENCOUNTER — Other Ambulatory Visit: Payer: Self-pay | Admitting: Student

## 2019-08-06 ENCOUNTER — Other Ambulatory Visit: Payer: Self-pay

## 2019-08-06 ENCOUNTER — Encounter (HOSPITAL_COMMUNITY): Payer: Self-pay

## 2019-08-06 ENCOUNTER — Other Ambulatory Visit (HOSPITAL_COMMUNITY): Payer: Self-pay | Admitting: Nurse Practitioner

## 2019-08-06 ENCOUNTER — Ambulatory Visit (HOSPITAL_COMMUNITY)
Admission: RE | Admit: 2019-08-06 | Discharge: 2019-08-06 | Disposition: A | Discharge status: Home / Self Care | Payer: Self-pay | Source: Ambulatory Visit | Attending: Nurse Practitioner | Admitting: Nurse Practitioner

## 2019-08-06 DIAGNOSIS — Z992 Dependence on renal dialysis: Secondary | ICD-10-CM

## 2019-08-06 DIAGNOSIS — Z79899 Other long term (current) drug therapy: Secondary | ICD-10-CM | POA: Insufficient documentation

## 2019-08-06 DIAGNOSIS — Z7982 Long term (current) use of aspirin: Secondary | ICD-10-CM | POA: Insufficient documentation

## 2019-08-06 DIAGNOSIS — N186 End stage renal disease: Secondary | ICD-10-CM

## 2019-08-06 DIAGNOSIS — E785 Hyperlipidemia, unspecified: Secondary | ICD-10-CM | POA: Insufficient documentation

## 2019-08-06 DIAGNOSIS — Y841 Kidney dialysis as the cause of abnormal reaction of the patient, or of later complication, without mention of misadventure at the time of the procedure: Secondary | ICD-10-CM | POA: Insufficient documentation

## 2019-08-06 DIAGNOSIS — Z87891 Personal history of nicotine dependence: Secondary | ICD-10-CM | POA: Insufficient documentation

## 2019-08-06 DIAGNOSIS — I129 Hypertensive chronic kidney disease with stage 1 through stage 4 chronic kidney disease, or unspecified chronic kidney disease: Secondary | ICD-10-CM | POA: Insufficient documentation

## 2019-08-06 DIAGNOSIS — T82510A Breakdown (mechanical) of surgically created arteriovenous fistula, initial encounter: Secondary | ICD-10-CM | POA: Insufficient documentation

## 2019-08-06 DIAGNOSIS — E1122 Type 2 diabetes mellitus with diabetic chronic kidney disease: Secondary | ICD-10-CM | POA: Insufficient documentation

## 2019-08-06 HISTORY — PX: IR DIALY SHUNT INTRO NEEDLE/INTRACATH INITIAL W/IMG RIGHT: IMG6115

## 2019-08-06 HISTORY — PX: IR US GUIDE VASC ACCESS RIGHT: IMG2390

## 2019-08-06 LAB — CBC
HCT: 54.4 % — ABNORMAL HIGH (ref 39.0–52.0)
Hemoglobin: 16.8 g/dL (ref 13.0–17.0)
MCH: 29.1 pg (ref 26.0–34.0)
MCHC: 30.9 g/dL (ref 30.0–36.0)
MCV: 94.1 fL (ref 80.0–100.0)
Platelets: 394 10*3/uL (ref 150–400)
RBC: 5.78 MIL/uL (ref 4.22–5.81)
RDW: 17.5 % — ABNORMAL HIGH (ref 11.5–15.5)
WBC: 10.4 10*3/uL (ref 4.0–10.5)
nRBC: 0 % (ref 0.0–0.2)

## 2019-08-06 LAB — BASIC METABOLIC PANEL
Anion gap: 17 — ABNORMAL HIGH (ref 5–15)
BUN: 52 mg/dL — ABNORMAL HIGH (ref 6–20)
CO2: 26 mmol/L (ref 22–32)
Calcium: 10 mg/dL (ref 8.9–10.3)
Chloride: 93 mmol/L — ABNORMAL LOW (ref 98–111)
Creatinine, Ser: 10.59 mg/dL — ABNORMAL HIGH (ref 0.61–1.24)
GFR calc Af Amer: 5 mL/min — ABNORMAL LOW (ref >60)
GFR calc non Af Amer: 5 mL/min — ABNORMAL LOW (ref >60)
Glucose, Bld: 179 mg/dL — ABNORMAL HIGH (ref 70–99)
Potassium: 5.2 mmol/L — ABNORMAL HIGH (ref 3.5–5.1)
Sodium: 136 mmol/L (ref 135–145)

## 2019-08-06 LAB — PROTIME-INR
INR: 1 (ref 0.8–1.2)
Prothrombin Time: 13.1 seconds (ref 11.4–15.2)

## 2019-08-06 LAB — APTT: aPTT: 35 seconds (ref 24–36)

## 2019-08-06 MED ORDER — MIDAZOLAM HCL 2 MG/2ML IJ SOLN
INTRAMUSCULAR | Status: AC
  Filled 2019-08-06: qty 2

## 2019-08-06 MED ORDER — MIDAZOLAM HCL 2 MG/2ML IJ SOLN
INTRAMUSCULAR | Status: AC | PRN
  Administered 2019-08-06: 0.5 mg via INTRAVENOUS
  Administered 2019-08-06: 1 mg via INTRAVENOUS

## 2019-08-06 MED ORDER — FENTANYL CITRATE (PF) 100 MCG/2ML IJ SOLN
INTRAMUSCULAR | Status: AC
  Filled 2019-08-06: qty 2

## 2019-08-06 MED ORDER — LIDOCAINE HCL 1 % IJ SOLN
INTRAMUSCULAR | Status: AC
  Filled 2019-08-06: qty 20

## 2019-08-06 MED ORDER — SODIUM CHLORIDE 0.9 % IV SOLN: INTRAVENOUS | Status: DC

## 2019-08-06 MED ORDER — IOHEXOL 300 MG/ML  SOLN
40.0000 mL | Freq: Once | INTRAMUSCULAR | Status: AC | PRN
  Administered 2019-08-06: 40 mL via INTRAVENOUS

## 2019-08-06 MED ORDER — SODIUM CHLORIDE 0.9 % IV SOLN
INTRAVENOUS | Status: AC | PRN
  Administered 2019-08-06: 10 mL/h via INTRAVENOUS

## 2019-08-06 MED ORDER — FENTANYL CITRATE (PF) 100 MCG/2ML IJ SOLN
INTRAMUSCULAR | Status: AC | PRN
  Administered 2019-08-06: 25 ug via INTRAVENOUS
  Administered 2019-08-06: 50 ug via INTRAVENOUS

## 2019-08-06 NOTE — Procedures (Addendum)
Interventional Radiology Procedure:   Indications: Poor flows in dialysis  Procedure: Right upper extremity fistulogram  Findings: Anastomosis is widely patent.  No focal stenosis. Central veins patent.   Complications: None     EBL: less than 20 ml  Plan: Discharge to home.  Fistula is ready to use.     Rakiya Krawczyk R. Anselm Pancoast, MD  Pager: [phone]

## 2019-08-06 NOTE — H&P (Addendum)
Chief Complaint: Patient was seen in consultation today for right arm dialysis fistula evaluation and possible intervention at the request of Valentina Gu  Referring Physician(s): Valentina Gu  Supervising Physician: Markus Daft  Patient Status: [facility] - Out-pt  History of Present Illness: Fernando Jones is a 57 y.o. 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  Last dialysis Sat 08/04/19 Slow flow and high pitch bruit per notes  Now request for evaluation with possible intervention  Past Medical History:  Diagnosis Date  . Anemia   . Erectile dysfunction 2012  . ESRD (end stage renal disease) ([place])    w/Left ureteral stone/hydronephrosis/notes 12/06/2017  . Hepatitis 1973   "? kind"  . Hyperlipidemia 2012  . Hypertension   . Leukocytosis   . Mild CAD    a. by cath 10/2018.  Marland Kitchen Pericardial effusion    a. small by echo 10/2018.  . Pulmonary nodule    a. by CT 10/2018.  . Tobacco abuse 2012   1/2 pack per day  . Type II diabetes mellitus ([place])     Past Surgical History:  Procedure Laterality Date  . APPENDECTOMY    . AV FISTULA PLACEMENT Right 12/09/2017   Procedure: ARTERIOVENOUS (AV) FISTULA CREATION;  Surgeon: Rosetta Posner, MD;  Location: [facility];  Service: Vascular;  Laterality: Right;  . CARDIAC CATHETERIZATION  05/12/2009   Archie Endo 04/07/2011  . CYSTOSCOPY/URETEROSCOPY/HOLMIUM LASER/STENT PLACEMENT Left 12/08/2017   Procedure: CYSTOSCOPY/RETROGRADE PYLEOGRAM LEFT URETEROSCOPY AND STONE EXTRACTION;  Surgeon: Festus Aloe, MD;  Location: [facility];  Service:  Urology;  Laterality: Left;  . HOLMIUM LASER APPLICATION Left [id number]   Procedure: HOLMIUM LASER APPLICATION;  Surgeon: Festus Aloe, MD;  Location: [facility];  Service: Urology;  Laterality: Left;  . INSERTION OF DIALYSIS CATHETER Right 12/09/2017   Procedure: EXCHANGE  OF TUNNELED  DIALYSIS CATHETER RIGHT INTERNAL JUGULAR.;  Surgeon: Rosetta Posner, MD;  Location: [facility];  Service: Vascular;  Laterality: Right;  . IR AV DIALY SHUNT INTRO NEEDLE/INTRACATH INITIAL W/PTA/IMG RIGHT Right 05/05/2018  . IR AV DIALY SHUNT INTRO NEEDLE/INTRACATH INITIAL W/PTA/IMG RIGHT Right 06/29/2019  . IR FLUORO GUIDE CV LINE RIGHT  12/07/2017  . IR REMOVAL TUN CV CATH W/O FL  06/05/2018  . IR US GUIDE VASC ACCESS RIGHT  12/07/2017  . IR US GUIDE VASC ACCESS RIGHT  05/05/2018  . IR US GUIDE VASC ACCESS RIGHT  06/29/2019  . LEFT HEART CATH AND CORONARY ANGIOGRAPHY N/A 10/17/2018   Procedure: LEFT HEART CATH AND CORONARY ANGIOGRAPHY;  Surgeon: Leonie Man, MD;  Location: [facility];  Service: Cardiovascular;  Laterality: N/A;  . THROMBECTOMY W/ EMBOLECTOMY Right 06/30/2018   Procedure: THROMBECTOMY and revision ARTERIOVENOUS FISTULA right RADIOCEPHALIC;  Surgeon: Angelia Mould, MD;  Location: [facility];  Service: Vascular;  Laterality: Right;  . ULTRASOUND GUIDANCE FOR VASCULAR ACCESS  10/17/2018   Procedure: Ultrasound Guidance For Vascular Access;  Surgeon: Leonie Man, MD;  Location: [facility];  Service: Cardiovascular;;    Allergies: Patient has no known allergies.  Medications: Prior to Admission medications   Medication Sig Start Date End  Date Taking? Authorizing Provider  aspirin EC 81 MG EC tablet Take 1 tablet (81 mg total) by mouth daily. 12/17/17   Bonnell Public, MD  atorvastatin (LIPITOR) 40 MG tablet Take 1 tablet (40 mg total) by mouth at bedtime. 01/16/19   Almyra Deforest, PA  dicyclomine (BENTYL) 20 MG tablet Take 1 tablet (20 mg total) by mouth 3 (three) times daily before  meals. 10/25/18   Rai, Vernelle Emerald, MD  ferric citrate (AURYXIA) 1 GM 210 MG(Fe) tablet Take 1 tablet (210 mg total) by mouth 3 (three) times daily with meals. 07/02/18   Kayleen Memos, DO  ondansetron (ZOFRAN) 4 MG tablet Take 1 tablet (4 mg total) by mouth every 6 (six) hours as needed for nausea. 10/25/18   Rai, Ripudeep K, MD  pantoprazole (PROTONIX) 40 MG tablet Take 1 tablet (40 mg total) by mouth 2 (two) times daily for 14 days. 12/20/18 01/03/19  Almyra Deforest, PA  ranitidine (ZANTAC) 150 MG tablet Take 150 mg by mouth daily. 05/24/18   [provider]  rOPINIRole (REQUIP) 0.25 MG tablet Take 0.25-0.5 mg by mouth at bedtime. 09/29/18   [provider]  tamsulosin (FLOMAX) 0.4 MG CAPS capsule Take 1 capsule (0.4 mg total) by mouth daily. 12/16/17   Bonnell Public, MD     Family History  Problem Relation Age of Onset  . Congestive Heart Failure Mother   . Diabetes Neg Hx     Social History   Socioeconomic History  . Marital status: Divorced    Spouse name: Not on file  . Number of children: Not on file  . Years of education: Not on file  . Highest education level: Not on file  Occupational History  . Not on file  Social Needs  . Financial resource strain: Not on file  . Food insecurity    Worry: Not on file    Inability: Not on file  . Transportation needs    Medical: Not on file    Non-medical: Not on file  Tobacco Use  . Smoking status: Former Smoker    Packs/day: 1.00    Types: Cigarettes    Quit date: 12/06/2016    Years since quitting: 2.6  . Smokeless tobacco: Never Used  Substance and Sexual Activity  . Alcohol use: No  . Drug use: No  . Sexual activity: Not Currently  Lifestyle  . Physical activity    Days per week: Not on file    Minutes per session: Not on file  . Stress: Not on file  Relationships  . Social Herbalist on phone: Not on file    Gets together: Not on file    Attends religious service: Not on file    Active  member of club or organization: Not on file    Attends meetings of clubs or organizations: Not on file    Relationship status: Not on file  Other Topics Concern  . Not on file  Social History Narrative  . Not on file    Review of Systems: A 12 point ROS discussed and pertinent positives are indicated in the HPI above.  All other systems are negative.  Review of Systems  Constitutional: Negative for activity change, fatigue and fever.  Respiratory: Negative for cough and shortness of breath.   Cardiovascular: Negative for chest pain.  Gastrointestinal: Negative for abdominal pain.  Neurological: Negative for weakness.  Psychiatric/Behavioral: Negative for behavioral problems and confusion.    Vital Signs:  BP 140/84   Pulse (!) 101   Temp 97.6 F (36.4 C) (Skin)   Resp 18   Ht 5\' 7"  (1.702 m)   Wt 166 lb (75.3 kg)   SpO2 98%   BMI 26.00 kg/m   Physical Exam Vitals signs reviewed.  Cardiovascular:     Rate and Rhythm: Normal rate and regular rhythm.     Heart sounds: Normal heart sounds.  Pulmonary:     Effort: Pulmonary effort is normal.     Breath sounds: Normal breath sounds.  Abdominal:     Tenderness: There is no abdominal tenderness.  Musculoskeletal: Normal range of motion.     Comments: Right arm dialysis fistula +bruit and +pulse  Skin:    General: Skin is warm and dry.  Neurological:     Mental Status: He is alert and oriented to person, place, and time.  Psychiatric:        Mood and Affect: Mood normal.        Behavior: Behavior normal.        Thought Content: Thought content normal.        Judgment: Judgment normal.     Imaging: No results found.  Labs:  CBC: Recent Labs    10/23/18 1226 10/24/18 0449 10/25/18 0925 06/29/19 1135  WBC 16.5* 24.1* 16.9* 9.9  HGB 11.2* 10.2* 11.0* 16.5  HCT 35.9* 32.8* 36.2* 53.4*  PLT 518* 575* 654* 366    COAGS: Recent Labs    06/29/19 1135  INR 1.1    BMP: Recent Labs    10/23/18 1226  10/24/18 0733 10/25/18 0925 06/29/19 1135  NA 132* 131* 134* 141  K 4.4 5.0 4.1 4.6  CL 92* 92* 95* 96*  CO2 26 24 26 27   GLUCOSE 211* 126* 150* 146*  BUN 39* 54* 43* 44*  CALCIUM 9.5 9.6 9.4 9.9  CREATININE 9.49* 11.52* 7.92* 7.89*  GFRNONAA 5* 4* 7* 7*  GFRAA 6* 5* 8* 8*    LIVER FUNCTION TESTS: Recent Labs    10/14/18 1952 10/16/18 0308  10/23/18 1226 10/24/18 0733 10/25/18 0925 01/15/19 0913  BILITOT 0.8 0.6  --   --   --  0.3 0.2  AST 20 14*  --   --   --  25 11  ALT 27 19  --   --   --  58* 17  ALKPHOS 57 60  --   --   --  100 104  PROT 7.8 6.5  --   --   --  6.7 6.8  ALBUMIN 3.7 3.0*   < > 2.8* 2.8* 2.7* 4.2   < > = values in this interval not displayed.    TUMOR MARKERS: No results for input(s): AFPTM, CEA, CA199, CHROMGRNA in the last 8760 hours.  Assessment and Plan:  Rt arm fistula Last intervention 1 mo ago-- angioplasty Now with similar issues; whistle bruit per Nephrology Scheduled for evaluation and possible intervention Pt is aware of procedure risks and benefits Including but not limited to Infection; bleeding; damage to structure Agreeable to proceed Consent signed and in chart  Thank you for this interesting consult.  I greatly enjoyed meeting [facility] and look forward to participating in their care.  A copy of this report was sent to the requesting provider on this date.  Electronically Signed: Lavonia Drafts, PA-C 08/06/2019, 11:06 AM   I spent a total of    25 Minutes in face to face in clinical consultation, greater  than 50% of which was counseling/coordinating care for rt arm dialysis fistula eval and poss intervention

## 2019-08-06 NOTE — Discharge Instructions (Addendum)
Disfuncin del acceso vascular para dilisis Dialysis Vascular Access Malfunction        Un acceso vascular es una entrada a los vasos sanguneos que se utiliza en la dilisis. La dilisis es un tratamiento usado para la insuficiencia renal. El mdico puede hacer un acceso vascular de Education officer, environmental, tales como:  Uniendo una arteria a una vena por debajo de la piel para agrandar un vaso sanguneo que se denomina fstula.  Uniendo una arteria a una vena por debajo de la piel con un tubo blando llamado injerto.  Colocando un tubo delgado y flexible (catter) en una vena grande, generalmente en el cuello, el pecho o la ingle. Un acceso vascular se puede obstruir o puede dejar de funcionar correctamente (disfuncin). Qu puede causar disfuncin del acceso vascular?  Infeccin. Esta es la causa ms frecuente de disfuncin.  Un cogulo sanguneo dentro de una parte de la fstula, injerto o catter. Un cogulo sanguneo puede obstruir completamente o parcialmente el flujo de [place].  Un obstculo en el injerto o el catter.  Una acumulacin de sangre (hematoma o moretn) cerca del injerto o del catter, que presiona contra este y [place] flujo de [place]. Cules son los signos y sntomas de disfuncin del acceso vascular? Entre los signos y sntomas de disfuncin del Paramedic vascular se incluyen:  Un cambio en la vibracin o el pulso (frmito) de la fstula o del injerto.  Desaparece el frmito de la fstula o del injerto.  Hay una inflamacin nueva o inusual del rea alrededor del acceso.  Una puncin fallida en el acceso por parte del equipo de dilisis.  El flujo de [place] a travs de la fstula, el injerto o el catter es demasiado lento para una dilisis eficaz.  Sangrado que no se puede controlar fcilmente cuando termina la dilisis de [place] y se retira la aguja.  Signos de infeccin como dolor, hinchazn, enrojecimiento, estras rojas, entumecimiento y secrecin de sangre  o pus que emana del acceso o de la zona. Qu ocurre si mi acceso vascular funciona mal? El mdico podr [employer] de [place], cultivos o una radiografa para descubrir cul fue el problema. La radiografa implica la inyeccin de un tinte (contraste) en el acceso vascular. El lquido aparece en la radiografa y permite al mdico observar si hay una obstruccin en el acceso vascular. El tratamiento vara segn la causa de la disfuncin:  Si el acceso vascular se infecta, el mdico podr indicarle antibiticos para controlar la infeccin.  Si se encuentra un cogulo en el acceso vascular, podr ser necesario que se someta a una ciruga para eliminarlo. Tambin se Emergency planning/management officer.  Si la obstruccin del Paramedic vascular se debe a alguna otra causa, como un pinzamiento en el injerto, es probable que necesite de [place] para desbloquear o Child psychotherapist injerto.  Si por cualquier razn hay una disfuncin, el mdico puede retirar el acceso y Environmental manager. Siga estas indicaciones en su casa: Medicamentos  [employer] de venta libre y los recetados solamente como se lo haya indicado el mdico.  Si le recetaron un antibitico, tmelo como se lo haya indicado el mdico. No deje de usar el antibitico aunque comience a Sports administrator. Actividad  No levante objetos pesados con el brazo que tiene el [place].  Trate de no golpear ni lesionar el brazo que tiene el [place]. Estilo de vida  No use ropa apretada ni joyas alrededor del acceso.  No duerma con el brazo del acceso debajo de  su cabeza o de su cuerpo. Instrucciones generales  Lvese las manos con agua y jabn antes de tocar la zona alrededor del acceso vascular. Use desinfectante para manos si no dispone de [place] y [place].  Concurra a todas las visitas de control como se lo haya indicado el mdico. Esto es muy importante. Elmer Bales en los controles puede causar una disfuncin permanente del acceso vascular, lo que  puede ser peligroso.  No deje que le tomen la presin arterial en el brazo que tiene el [place].  Mantenga el rea del acceso limpia y no use maquillaje, cremas, lociones ni ungentos.  Controle la zona del [employer] para detectar signos de infeccin. Est atento a los siguientes signos: ? Aumento del enrojecimiento, de la hinchazn o del dolor. ? Mayor presencia de lquido o [place]. ? Calor. ? Pus o mal olor. Comunquese con un mdico si:  La hinchazn alrededor del acceso vascular empeora.  Aparece un dolor nuevo. Solicite ayuda de inmediato si:  Tiene sangrado en el acceso vascular que no puede controlarse fcilmente.  Tiene dolor, entumecimiento, piel inusualmente plida, dedos azulados o llagas en la punta de los dedos de la mano del lado de la fstula.  Tiene escalofros.  Tiene fiebre.  Hay pus u otro lquido (drenaje) en el lugar del acceso vascular.  Hay enrojecimiento o lneas rojas en la piel alrededor, por encima o por debajo del acceso vascular.  El acceso est caliente, hinchado, rojo y es muy doloroso.  Repentinamente puede ver el manguito del catter que se [place] en el acceso. Resumen  Un acceso vascular es una entrada a los vasos sanguneos que se utiliza en la dilisis.  La disfuncin del acceso vascular puede tener varias razones.  El tratamiento vara segn la causa dela disfuncin.  Concurra a todas las visitas de control como se lo haya indicado el mdico. Toda demora en los controles puede causar una disfuncin permanente del acceso vascular, lo que puede ser peligroso. Esta informacin no tiene Marine scientist el consejo del mdico. Asegrese de hacerle al mdico cualquier pregunta que tenga. Document Released: 03/10/2009 Document Revised: 02/17/2018 Document Reviewed: 05/26/2017 Elsevier Patient Education  2020 [place] en los adultos, cuidados posteriores (Moderate Conscious Sedation, Adult, Care  After) Estas indicaciones le proporcionan informacin acerca de cmo deber cuidarse despus del procedimiento. El mdico tambin podr darle instrucciones ms especficas. El tratamiento ha sido planificado segn las prcticas mdicas actuales, pero en algunos casos pueden ocurrir problemas. Comunquese con el mdico si tiene algn problema o dudas despus del procedimiento. QU ESPERAR DESPUS DEL PROCEDIMIENTO Despus del procedimiento, es comn:  Sentirse somnoliento durante varias horas.  Sentirse torpe y [employer] de equilibrio durante varias horas.  Perder el sentido de la realidad durante varias horas.  Vomitar si come [employer]. INSTRUCCIONES PARA EL CUIDADO EN EL HOGAR  Durante al menos 24horas despus del procedimiento:  No haga lo siguiente: ? Participar en actividades que impliquen posibles cadas o lesiones. ? Conducir vehculos. ? Operar maquinarias pesadas. ? Beber alcohol. ? Tomar somnferos o medicamentos que causen somnolencia. ? Firmar documentos legales ni tomar [employer]. ? Cuidar a nios por su cuenta.  Hacer reposo. Comida y bebida  Siga la dieta recomendada por el mdico.  Si vomita: ? Pruebe agua, jugo o sopa cuando usted pueda beber sin vomitar. ? Asegrese de no tener nuseas antes de ingerir alimentos slidos. Instrucciones generales  Permanezca con un adulto responsable hasta que est  completamente despierto y consciente.  Tome los medicamentos de venta libre y los recetados solamente como se lo haya indicado el mdico.  Si fuma, no lo haga sin supervisin.  Concurra a todas las visitas de control como se lo haya indicado el mdico. Esto es importante. SOLICITE ATENCIN MDICA SI:  Sigue teniendo nuseas o vomitando.  Tiene sensacin de desvanecimiento.  Le aparece una erupcin cutnea.  Tiene fiebre. SOLICITE ATENCIN MDICA DE INMEDIATO SI:  Tiene dificultad para respirar. Esta informacin no tiene Hydrologist el consejo del mdico. Asegrese de hacerle al mdico cualquier pregunta que tenga. Document Released: 11/27/2013 Document Revised: 07/29/2016 Document Reviewed: 03/13/2016 Elsevier Patient Education  2020 [place] dilisis Dialysis Fistulogram  Una fistulografa de dilisis es una radiografa para observar el interior del lugar en el que la sangre se extrae y se devuelve al cuerpo durante la dilisis. Las fstulas y los injertos proporcionan un acceso fcil y eficaz para la dilisis. Sin embargo, a Armed forces operational officer. La fstula o el injerto se obstruyen o se estrechan. Esto puede hacer que la dilisis sea menos eficaz. Tal vez deban realizarle una fistulografa para verificar estos problemas. Si se detecta un problema, su mdico puede realizar tratamientos durante el procedimiento para eliminar la obstruccin o el estrechamiento de las zonas. Los tratamientos para abrir una fstula o un injerto de dilisis obstruido pueden incluir:  Extraer el cogulo de la fstula o del injerto con un dispositivo (trombectoma).  Realizar un procedimiento para abrir o Psychologist, clinical zona obstruida con un baln inflable (angioplastia).  Colocar de un pequeo tubo de [place] metlica (stent) para mantener abierta la fstula o el injerto.  Inyectar un medicamento para Psychologist, educational cogulo (tromblisis). Informe al mdico acerca de lo siguiente:  Cualquier alergia que tenga, incluida cualquier reaccin que haya tenido a un tinte de [place].  Todos los [employer], incluidos vitaminas, hierbas, gotas oftlmicas, cremas y medicamentos de venta libre.  Cualquier problema que usted o sus familiares hayan tenido con anestsicos.  Cualquier enfermedad de la sangre que tenga.  Cirugas a las que se someti.  Cualquier afeccin mdica que tenga.  Si est embarazada o podra estarlo.  Cualquier dolor, enrojecimiento o hinchazn que tenga en la extremidad  que tiene la fstula o el injerto de dilisis.  Cualquier sangrado proveniente de la fstula o el injerto de dilisis.  Cualquiera de los siguientes sntomas en la parte del cuerpo hacia dnde va la fstula o el injerto de dilisis: ? Entumecimiento. ? Hormigueo. ? Sensacin de fro. ? Zonas que estn de color azulado o blanco plido. Cules son los riesgos? En general, se trata de un procedimiento seguro. Sin embargo, pueden ocurrir complicaciones, por ejemplo:  Infeccin.  Hemorragia.  Reacciones alrgicas a los medicamentos o a los tintes.  Dao a otras estructuras, como nervios o vasos sanguneos cercanos.  Un cogulo de [employer] fstula o el injerto de dilisis.  Un cogulo de sangre que se desplaza hasta los pulmones (embolia pulmonar). Qu ocurre antes del procedimiento? Instrucciones generales  Siga las indicaciones del mdico respecto de las restricciones de comidas y bebidas.  Consulte al mdico sobre: ? Quarry manager o suspender los medicamentos que toma habitualmente. Esto es muy importante si toma medicamentos para la diabetes o diluyentes de la sangre anticoagulantes. ? Tomar medicamentos como aspirina e ibuprofeno. Estos medicamentos pueden tener un efecto anticoagulante en la [place]. No tome estos medicamentos a menos que el mdico se lo indique. ?  Tomar medicamentos de [employer], vitaminas, hierbas y suplementos.  Haga que alguien lo lleve a su casa desde el hospital o la clnica.  Pdale a un adulto responsable que lo cuide durante al menos 24horas despus de que le den el alta del hospital o de la [place]. Esto es importante.  Pueden extraerle Truddie Coco de [place] o pedirle [place] de [place]. Estas muestras ayudarn al mdico a saber cun bien funcionan los riones y Engineer, maintenance (IT), y qu tan bien coagula la sangre. Qu ocurre durante el procedimiento?  Le colocarn una va intravenosa en una de las venas.  Le administrarn uno o ms de los siguientes  medicamentos: ? Un medicamento para ayudarlo a relajarse (sedante). ? Un anestsico para adormecer la zona (anestesia local) de la extremidad con la fstula o el injerto.  Le insertarn una aguja en la vena.  Un pequeo tubo delgado (catter) se insertar en la aguja y luego se lo guiar hasta la zona de los posibles problemas.  Se inyectar un tinte (sustancia de contraste) en el catter. A medida que la sustancia de contraste pasa por el cuerpo, es posible que sienta calor.  Se tomarn radiografas. La sustancia de Armed forces logistics/support/administrative officer dnde se encuentran los estrechamientos o las obstrucciones.  Si se observa un estrechamiento u obstruccin, el mdico puede hacer uno de los siguientes procedimientos para abrir la obstruccin: ? Trombectoma. El mdico usar un dispositivo mecnico en el extremo del catter para Surveyor, mining. ? Angioplastia. El mdico introducir un alambre gua y un catter con un baln en la punta hasta el lugar de la obstruccin. Se inflar el baln durante un breve perodo de tiempo. Tambin puede colocarse un stent para mantener abierta la zona obstruida. ? Tromblisis. El mdico administrar un medicamento para disolver cogulos a travs del Holiday representative.  Cuando termine el procedimiento, se Transport planner.  En algunos casos, el lugar de insercin del catter se cerrar con uno o dos puntos (sutura).  Se aplicar presin para detener el sangrado y se cubrir la piel con una venda (vendaje). Este procedimiento puede variar segn el mdico y el hospital. Sander Nephew ocurre despus del procedimiento?  Le controlarn la presin arterial, la frecuencia cardaca, la frecuencia respiratoria y Retail buyer de oxgeno en la sangre hasta que le den el alta del hospital o la clnica. Su mdico tambin Financial trader de Paramedic.  Usted tendr [employer] en cama por varias horas.  No conduzca durante 24horas si le administraron un sedante durante el  procedimiento. Resumen  Una fistulografa de dilisis es una radiografa para observar el interior del lugar en el que la sangre se extrae y se devuelve al cuerpo durante la dilisis.  Si se detecta un problema, su mdico tambin puede realizar tratamientos [employer] procedimiento para eliminar la obstruccin o el estrechamiento de las zonas. Esta informacin no tiene Marine scientist el consejo del mdico. Asegrese de hacerle al mdico cualquier pregunta que tenga. Document Released: 04/08/2014 Document Revised: 01/25/2018 Document Reviewed: 01/25/2018 Elsevier Patient Education  2020 [place]. Dialysis Vascular Access Malfunction        A vascular access is an entrance to your blood vessels that can be used for dialysis. Dialysis is a treatment used for kidney failure. A health care provider can make a vascular access in many ways, such as by:  Joining an artery to a vein under your skin to make a bigger blood vessel called a fistula.  Joining an artery to a  vein under your skin using a soft tube called a graft.  Placing a thin, flexible tube (catheter) in a large vein, usually in your neck, chest, or groin. A vascular access can become blocked or stop working correctly (malfunction). What can cause my vascular access to malfunction?  Infection. This is the most common cause of malfunction.  A blood clot inside a part of the fistula, graft, or catheter. A blood clot can completely or partially block the flow of blood.  A kink in the graft or catheter.  A collection of blood (hematoma or bruise) next to the graft or catheter that pushes against it, blocking the flow of blood. What are signs and symptoms of vascular access malfunction? Signs and symptoms of vascular access malfunction include:  A change in the vibration or pulse (thrill) of your fistula or graft.  The thrill of your fistula or graft being gone.  New or unusual swelling of the area around the  access.  An unsuccessful puncture of your access by the dialysis team.  The flow of blood through the fistula, graft, or catheter being too slow for effective dialysis.  Bleeding that cannot be easily controlled when routine dialysis is completed and the needle is removed.  Signs of infection such as pain, swelling, redness, red streaks, numbness, and blood or pus coming from or around the access. What happens if my vascular access malfunctions? If your vascular access malfunctions, your health care provider may order blood work, cultures, or an X-ray test to find out what went wrong. The X-ray test involves the injection of a dye (contrast) into the vascular access. The contrast shows up on the X-ray and lets your health care provider see if there is a blockage in the vascular access. Treatment varies depending on the cause of the malfunction:  If the vascular access is infected, your health care provider may prescribe antibiotic medicine to control the infection.  If a clot is found in the vascular access, you may need surgery to remove the clot. Blood thinning medicines may also be used.  If a blockage in the vascular access is due to some other cause, such as a kink in a graft, you will likely need surgery to unblock or replace the graft.  If there is a malfunction for any reason, your health care provider may remove and replace your access. Follow these instructions at home: Medicines  Take over-the-counter and prescription medicines only as told by your health care provider.  If you were prescribed an antibiotic medicine, use it as told by your health care provider. Do not stop using the antibiotic even if you start to feel better. Activity  Do not lift heavy things with the arm that has the access.  Try not to bump or hurt the arm that has the access. Lifestyle  Do not wear jewelry or tight clothing around the access.  Do not sleep by placing the access arm under your head  or body. General instructions  Wash your hands with soap and water before touching the area around the vascular access. If soap and water are not available, use hand sanitizer.  Keep all follow-up visits as told by your health care provider. This is very important. Any delay in follow-up could cause permanent dysfunction of the vascular access, which may be dangerous.  Do not measure your blood pressure on the arm that has the access.  Keep the access area clean and do not use makeup, creams, lotions, or ointments on  it.  Check your access area every day for signs of infection. Check for: ? More redness, swelling, or pain. ? More fluid or blood. ? Warmth. ? Pus or a bad smell. Contact a health care provider if:  Swelling around the vascular access gets worse.  You develop new pain. Get help right away if:  You have bleeding at the vascular access that cannot be easily controlled.  You have pain, numbness, an unusual pale skin color, or blue fingers or sores at the tips of your fingers in the hand on the side of your fistula.  You have chills.  You have a fever.  You have pus or other fluid (drainage) at the vascular access site.  You develop skin redness or red streaking on the skin around, above, or below the vascular access.  Your access is hot, swollen, red, and very painful.  You can suddenly see the cuff of your catheter used in the access. Summary  A vascular access is an entrance to your blood vessels that can be used for dialysis.  Several things can cause your vascular access to malfunction.  Treatment varies depending on the cause of the malfunction.  Keep all follow-up visits as told by your health care provider. Any delay in follow-up could cause permanent dysfunction of the vascular access, which may be dangerous. This information is not intended to replace advice given to you by your health care provider. Make sure you discuss any questions you have with  your health care provider. Document Released: 10/25/2006 Document Revised: 02/17/2018 Document Reviewed: 12/17/2016 Elsevier Patient Education  [place]. Moderate Conscious Sedation, Adult, Care After These instructions provide you with information about caring for yourself after your procedure. Your health care provider may also give you more specific instructions. Your treatment has been planned according to current medical practices, but problems sometimes occur. Call your health care provider if you have any problems or questions after your procedure. What can I expect after the procedure? After your procedure, it is common:  To feel sleepy for several hours.  To feel clumsy and have poor balance for several hours.  To have poor judgment for several hours.  To vomit if you eat too soon. Follow these instructions at home: For at least 24 hours after the procedure:   Do not: ? Participate in activities where you could fall or become injured. ? Drive. ? Use heavy machinery. ? Drink alcohol. ? Take sleeping pills or medicines that cause drowsiness. ? Make important decisions or sign legal documents. ? Take care of children on your own.  Rest. Eating and drinking  Follow the diet recommended by your health care provider.  If you vomit: ? Drink water, juice, or soup when you can drink without vomiting. ? Make sure you have little or no nausea before eating solid foods. General instructions  Have a responsible adult stay with you until you are awake and alert.  Take over-the-counter and prescription medicines only as told by your health care provider.  If you smoke, do not smoke without supervision.  Keep all follow-up visits as told by your health care provider. This is important. Contact a health care provider if:  You keep feeling nauseous or you keep vomiting.  You feel light-headed.  You develop a rash.  You have a fever. Get help right away  if:  You have trouble breathing. This information is not intended to replace advice given to you by your health care provider. Make sure  you discuss any questions you have with your health care provider. Document Released: 09/12/2013 Document Revised: 11/04/2017 Document Reviewed: 03/13/2016 Elsevier Patient Education  2020 [employer].

## 2020-01-10 ENCOUNTER — Emergency Department (HOSPITAL_COMMUNITY): Payer: Self-pay

## 2020-01-10 ENCOUNTER — Other Ambulatory Visit: Payer: Self-pay

## 2020-01-10 ENCOUNTER — Inpatient Hospital Stay (HOSPITAL_COMMUNITY)
Admission: EM | Admit: 2020-01-10 | Discharge: 2020-01-15 | DRG: 551 | Disposition: A | Discharge status: Home / Self Care | Payer: Self-pay | Source: Ambulatory Visit | Attending: Student in an Organized Health Care Education/Training Program | Admitting: Student in an Organized Health Care Education/Training Program

## 2020-01-10 ENCOUNTER — Encounter (HOSPITAL_COMMUNITY): Payer: Self-pay | Admitting: Emergency Medicine

## 2020-01-10 ENCOUNTER — Observation Stay (HOSPITAL_COMMUNITY): Payer: Self-pay

## 2020-01-10 DIAGNOSIS — K5900 Constipation, unspecified: Secondary | ICD-10-CM | POA: Diagnosis present

## 2020-01-10 DIAGNOSIS — Z8249 Family history of ischemic heart disease and other diseases of the circulatory system: Secondary | ICD-10-CM

## 2020-01-10 DIAGNOSIS — I2510 Atherosclerotic heart disease of native coronary artery without angina pectoris: Secondary | ICD-10-CM | POA: Diagnosis present

## 2020-01-10 DIAGNOSIS — M50123 Cervical disc disorder at C6-C7 level with radiculopathy: Principal | ICD-10-CM | POA: Diagnosis present

## 2020-01-10 DIAGNOSIS — E1165 Type 2 diabetes mellitus with hyperglycemia: Secondary | ICD-10-CM | POA: Diagnosis present

## 2020-01-10 DIAGNOSIS — N186 End stage renal disease: Secondary | ICD-10-CM

## 2020-01-10 DIAGNOSIS — M4722 Other spondylosis with radiculopathy, cervical region: Secondary | ICD-10-CM | POA: Diagnosis present

## 2020-01-10 DIAGNOSIS — M79601 Pain in right arm: Secondary | ICD-10-CM

## 2020-01-10 DIAGNOSIS — M4802 Spinal stenosis, cervical region: Secondary | ICD-10-CM | POA: Diagnosis present

## 2020-01-10 DIAGNOSIS — E785 Hyperlipidemia, unspecified: Secondary | ICD-10-CM | POA: Diagnosis present

## 2020-01-10 DIAGNOSIS — Z87891 Personal history of nicotine dependence: Secondary | ICD-10-CM

## 2020-01-10 DIAGNOSIS — E1122 Type 2 diabetes mellitus with diabetic chronic kidney disease: Secondary | ICD-10-CM | POA: Diagnosis present

## 2020-01-10 DIAGNOSIS — E1129 Type 2 diabetes mellitus with other diabetic kidney complication: Secondary | ICD-10-CM | POA: Diagnosis present

## 2020-01-10 DIAGNOSIS — Z992 Dependence on renal dialysis: Secondary | ICD-10-CM

## 2020-01-10 DIAGNOSIS — M50323 Other cervical disc degeneration at C6-C7 level: Secondary | ICD-10-CM

## 2020-01-10 DIAGNOSIS — M5412 Radiculopathy, cervical region: Secondary | ICD-10-CM | POA: Diagnosis present

## 2020-01-10 DIAGNOSIS — R531 Weakness: Secondary | ICD-10-CM

## 2020-01-10 DIAGNOSIS — R109 Unspecified abdominal pain: Secondary | ICD-10-CM

## 2020-01-10 DIAGNOSIS — Z79899 Other long term (current) drug therapy: Secondary | ICD-10-CM

## 2020-01-10 DIAGNOSIS — F329 Major depressive disorder, single episode, unspecified: Secondary | ICD-10-CM | POA: Diagnosis present

## 2020-01-10 DIAGNOSIS — N2581 Secondary hyperparathyroidism of renal origin: Secondary | ICD-10-CM | POA: Diagnosis present

## 2020-01-10 DIAGNOSIS — E875 Hyperkalemia: Secondary | ICD-10-CM | POA: Diagnosis present

## 2020-01-10 DIAGNOSIS — E1140 Type 2 diabetes mellitus with diabetic neuropathy, unspecified: Secondary | ICD-10-CM | POA: Diagnosis present

## 2020-01-10 DIAGNOSIS — G8929 Other chronic pain: Secondary | ICD-10-CM | POA: Diagnosis present

## 2020-01-10 DIAGNOSIS — M25519 Pain in unspecified shoulder: Secondary | ICD-10-CM

## 2020-01-10 DIAGNOSIS — L299 Pruritus, unspecified: Secondary | ICD-10-CM | POA: Diagnosis present

## 2020-01-10 DIAGNOSIS — R29818 Other symptoms and signs involving the nervous system: Secondary | ICD-10-CM | POA: Diagnosis present

## 2020-01-10 DIAGNOSIS — Z66 Do not resuscitate: Secondary | ICD-10-CM | POA: Diagnosis present

## 2020-01-10 DIAGNOSIS — Z8673 Personal history of transient ischemic attack (TIA), and cerebral infarction without residual deficits: Secondary | ICD-10-CM

## 2020-01-10 DIAGNOSIS — M7541 Impingement syndrome of right shoulder: Secondary | ICD-10-CM

## 2020-01-10 DIAGNOSIS — Z515 Encounter for palliative care: Secondary | ICD-10-CM

## 2020-01-10 DIAGNOSIS — Z20822 Contact with and (suspected) exposure to covid-19: Secondary | ICD-10-CM | POA: Diagnosis present

## 2020-01-10 DIAGNOSIS — Z7982 Long term (current) use of aspirin: Secondary | ICD-10-CM

## 2020-01-10 DIAGNOSIS — Z87442 Personal history of urinary calculi: Secondary | ICD-10-CM

## 2020-01-10 DIAGNOSIS — D631 Anemia in chronic kidney disease: Secondary | ICD-10-CM | POA: Diagnosis present

## 2020-01-10 DIAGNOSIS — I700 Atherosclerosis of aorta: Secondary | ICD-10-CM | POA: Diagnosis present

## 2020-01-10 DIAGNOSIS — I120 Hypertensive chronic kidney disease with stage 5 chronic kidney disease or end stage renal disease: Secondary | ICD-10-CM

## 2020-01-10 DIAGNOSIS — M25511 Pain in right shoulder: Secondary | ICD-10-CM

## 2020-01-10 LAB — COMPREHENSIVE METABOLIC PANEL
ALT: 24 U/L (ref 0–44)
AST: 33 U/L (ref 15–41)
Albumin: 3.5 g/dL (ref 3.5–5.0)
Alkaline Phosphatase: 87 U/L (ref 38–126)
Anion gap: 18 — ABNORMAL HIGH (ref 5–15)
BUN: 54 mg/dL — ABNORMAL HIGH (ref 6–20)
CO2: 22 mmol/L (ref 22–32)
Calcium: 8.8 mg/dL — ABNORMAL LOW (ref 8.9–10.3)
Chloride: 89 mmol/L — ABNORMAL LOW (ref 98–111)
Creatinine, Ser: 8.17 mg/dL — ABNORMAL HIGH (ref 0.61–1.24)
GFR calc Af Amer: 8 mL/min — ABNORMAL LOW (ref >60)
GFR calc non Af Amer: 6 mL/min — ABNORMAL LOW (ref >60)
Glucose, Bld: 407 mg/dL — ABNORMAL HIGH (ref 70–99)
Potassium: 6.1 mmol/L — ABNORMAL HIGH (ref 3.5–5.1)
Sodium: 129 mmol/L — ABNORMAL LOW (ref 135–145)
Total Bilirubin: 1.2 mg/dL (ref 0.3–1.2)
Total Protein: 7.3 g/dL (ref 6.5–8.1)

## 2020-01-10 LAB — CBC
HCT: 48.6 % (ref 39.0–52.0)
Hemoglobin: 15.6 g/dL (ref 13.0–17.0)
MCH: 26.6 pg (ref 26.0–34.0)
MCHC: 32.1 g/dL (ref 30.0–36.0)
MCV: 82.9 fL (ref 80.0–100.0)
Platelets: 399 10*3/uL (ref 150–400)
RBC: 5.86 MIL/uL — ABNORMAL HIGH (ref 4.22–5.81)
RDW: 16.2 % — ABNORMAL HIGH (ref 11.5–15.5)
WBC: 11 10*3/uL — ABNORMAL HIGH (ref 4.0–10.5)
nRBC: 0 % (ref 0.0–0.2)

## 2020-01-10 LAB — GLUCOSE, CAPILLARY
Glucose-Capillary: 219 mg/dL — ABNORMAL HIGH (ref 70–99)
Glucose-Capillary: 518 mg/dL (ref 70–99)
Glucose-Capillary: 459 mg/dL — ABNORMAL HIGH (ref 70–99)

## 2020-01-10 LAB — SARS CORONAVIRUS 2 (TAT 6-24 HRS): SARS Coronavirus 2: NEGATIVE

## 2020-01-10 LAB — BASIC METABOLIC PANEL
Anion gap: 17 — ABNORMAL HIGH (ref 5–15)
BUN: 57 mg/dL — ABNORMAL HIGH (ref 6–20)
CO2: 23 mmol/L (ref 22–32)
Calcium: 8.9 mg/dL (ref 8.9–10.3)
Chloride: 90 mmol/L — ABNORMAL LOW (ref 98–111)
Creatinine, Ser: 9.62 mg/dL — ABNORMAL HIGH (ref 0.61–1.24)
GFR calc Af Amer: 6 mL/min — ABNORMAL LOW (ref >60)
GFR calc non Af Amer: 5 mL/min — ABNORMAL LOW (ref >60)
Glucose, Bld: 191 mg/dL — ABNORMAL HIGH (ref 70–99)
Potassium: 4.9 mmol/L (ref 3.5–5.1)
Sodium: 130 mmol/L — ABNORMAL LOW (ref 135–145)

## 2020-01-10 LAB — TROPONIN I (HIGH SENSITIVITY)
Troponin I (High Sensitivity): 30 ng/L — ABNORMAL HIGH (ref <18)
Troponin I (High Sensitivity): 36 ng/L — ABNORMAL HIGH (ref <18)
Troponin I (High Sensitivity): 37 ng/L — ABNORMAL HIGH (ref <18)

## 2020-01-10 LAB — HIV ANTIBODY (ROUTINE TESTING W REFLEX): HIV Screen 4th Generation wRfx: NONREACTIVE

## 2020-01-10 MED ORDER — DIPHENHYDRAMINE HCL 25 MG PO CAPS
25.0000 mg | ORAL_CAPSULE | Freq: Four times a day (QID) | ORAL | Status: DC | PRN
  Administered 2020-01-10 – 2020-01-13 (×5): 25 mg via ORAL
  Filled 2020-01-10 (×4): qty 1

## 2020-01-10 MED ORDER — ASPIRIN EC 81 MG PO TBEC
81.0000 mg | DELAYED_RELEASE_TABLET | Freq: Every day | ORAL | Status: DC
  Administered 2020-01-11 – 2020-01-15 (×5): 81 mg via ORAL
  Filled 2020-01-10 (×5): qty 1

## 2020-01-10 MED ORDER — PREDNISONE 20 MG PO TABS
40.0000 mg | ORAL_TABLET | Freq: Once | ORAL | Status: AC
  Administered 2020-01-10: 17:00:00 40 mg via ORAL
  Filled 2020-01-10: qty 2

## 2020-01-10 MED ORDER — INSULIN ASPART 100 UNIT/ML ~~LOC~~ SOLN
0.0000 [IU] | Freq: Three times a day (TID) | SUBCUTANEOUS | Status: DC
  Administered 2020-01-11: 18:00:00 1 [IU] via SUBCUTANEOUS
  Administered 2020-01-11: 3 [IU] via SUBCUTANEOUS
  Administered 2020-01-14 – 2020-01-15 (×2): 2 [IU] via SUBCUTANEOUS

## 2020-01-10 MED ORDER — ASPIRIN 81 MG PO CHEW
324.0000 mg | CHEWABLE_TABLET | Freq: Once | ORAL | Status: AC
  Administered 2020-01-10: 324 mg via ORAL
  Filled 2020-01-10: qty 4

## 2020-01-10 MED ORDER — IOHEXOL 350 MG/ML SOLN
100.0000 mL | Freq: Once | INTRAVENOUS | Status: AC | PRN
  Administered 2020-01-10: 100 mL via INTRAVENOUS

## 2020-01-10 MED ORDER — CINACALCET HCL 30 MG PO TABS
120.0000 mg | ORAL_TABLET | Freq: Every day | ORAL | Status: DC
  Administered 2020-01-11 – 2020-01-15 (×5): 120 mg via ORAL
  Filled 2020-01-10 (×5): qty 4

## 2020-01-10 MED ORDER — SENNOSIDES-DOCUSATE SODIUM 8.6-50 MG PO TABS: 1.0000 | ORAL_TABLET | Freq: Every evening | ORAL | Status: DC | PRN

## 2020-01-10 MED ORDER — HEPARIN SODIUM (PORCINE) 5000 UNIT/ML IJ SOLN
5000.0000 [IU] | Freq: Three times a day (TID) | INTRAMUSCULAR | Status: DC
  Administered 2020-01-10 – 2020-01-15 (×14): 5000 [IU] via SUBCUTANEOUS
  Filled 2020-01-10 (×14): qty 1

## 2020-01-10 MED ORDER — ROPINIROLE HCL 0.25 MG PO TABS
0.2500 mg | ORAL_TABLET | Freq: Every day | ORAL | Status: DC
  Administered 2020-01-10 – 2020-01-14 (×5): 0.5 mg via ORAL
  Filled 2020-01-10 (×7): qty 2

## 2020-01-10 MED ORDER — MORPHINE SULFATE (PF) 2 MG/ML IV SOLN
2.0000 mg | Freq: Once | INTRAVENOUS | Status: AC
  Administered 2020-01-10: 09:00:00 2 mg via INTRAVENOUS
  Filled 2020-01-10: qty 1

## 2020-01-10 MED ORDER — NITROGLYCERIN 0.4 MG SL SUBL
0.4000 mg | SUBLINGUAL_TABLET | SUBLINGUAL | Status: DC | PRN
  Administered 2020-01-10: 0.4 mg via SUBLINGUAL
  Filled 2020-01-10: qty 1

## 2020-01-10 MED ORDER — INSULIN ASPART 100 UNIT/ML ~~LOC~~ SOLN
4.0000 [IU] | Freq: Once | SUBCUTANEOUS | Status: AC
  Administered 2020-01-10: 4 [IU] via SUBCUTANEOUS

## 2020-01-10 MED ORDER — MORPHINE SULFATE (PF) 2 MG/ML IV SOLN
2.0000 mg | Freq: Once | INTRAVENOUS | Status: AC
  Administered 2020-01-10: 2 mg via INTRAVENOUS
  Filled 2020-01-10: qty 1

## 2020-01-10 MED ORDER — HYDROMORPHONE HCL 1 MG/ML IJ SOLN
1.0000 mg | INTRAMUSCULAR | Status: DC | PRN
  Administered 2020-01-11: 1 mg via INTRAVENOUS
  Filled 2020-01-10: qty 1

## 2020-01-10 MED ORDER — MIDODRINE HCL 5 MG PO TABS
10.0000 mg | ORAL_TABLET | Freq: Every day | ORAL | Status: DC
  Administered 2020-01-10 – 2020-01-15 (×6): 10 mg via ORAL
  Filled 2020-01-10 (×6): qty 2

## 2020-01-10 MED ORDER — HYDROMORPHONE HCL 1 MG/ML IJ SOLN
0.5000 mg | INTRAMUSCULAR | Status: DC | PRN
  Administered 2020-01-10: 0.5 mg via INTRAVENOUS
  Filled 2020-01-10: qty 0.5

## 2020-01-10 MED ORDER — HYDROMORPHONE HCL 1 MG/ML IJ SOLN
0.5000 mg | Freq: Once | INTRAMUSCULAR | Status: AC
  Administered 2020-01-10: 21:00:00 0.5 mg via INTRAVENOUS
  Filled 2020-01-10: qty 0.5

## 2020-01-10 MED ORDER — MORPHINE SULFATE (PF) 2 MG/ML IV SOLN
2.0000 mg | Freq: Once | INTRAVENOUS | Status: AC
  Administered 2020-01-10: 11:00:00 2 mg via INTRAVENOUS
  Filled 2020-01-10: qty 1

## 2020-01-10 NOTE — H&P (Signed)
Date: 01/10/2020               Patient Name:  Fernando Jones MRN: [id number]  DOB: 1961/06/01 Age / Sex: 59 y.o., male   PCP: Patient, No Pcp Per         Medical Service: Internal Medicine Teaching Service         Attending Physician: Dr. Evette Doffing, Mallie Mussel, *    First Contact: Dr. Marianna Payment Pager: [phone]  Second Contact: Dr. Maricela Bo Pager: [phone]       After Hours (After 5p/  First Contact Pager: [phone]  weekends / holidays): Second Contact Pager: [phone]   Chief Complaint: Right arm pain with focal   History of Present Illness:  Fernando Jones is a 59 y.o. male with a pertinent past medical history of ESRD (TTS), HLD, HTN, CAD, T2DM, who presented with acute onset right arm pain that starts in the forearm and radiates up to his neck. Patient states that the pain is 10/10 and feels like needles pricking him. He states that he woke up with the pain and it has been progressively getting worse. He tried to go to dialysis today, but could only tolerate 1 hour before going to the hospital due to pain. He states that he has had this pain off and on for the last 6 months, but never this severe. He has tried Asprin and tylenol without relief. He denies change in sensation of his arm, but admits to weakness. He denies pain or rash over the AVF site. He denies recent trauma to the area. Patient states that he previously had imaging of his neck in 2019 due to pain which showed mild degenerative changes of the cervical spine at C6-C7 with mild foraminal narrowing bilaterally at C6-7 without neural impingement  He denies chest pain, shortness of breath, abdominal pain, nausea or vomiting, recent travel, change in medications, sick contacts, or previous shoulder surgery.  He endorses fatigue, weakness and arm pain  ED work-up consists of CT head showed not acute intracranial findings some degenerative disease at C6-7 with age advanced atherosclerosis. CTA of chest with no evidence of aortic  aneurysm, dissection, or acute aortic pathology, Patient was given morphine 2mg  with minimal resolution of his pain. Patient's troponin were 36 and 37 without s/sx of ACS. EKG showed sinus rhythm. He was given pain medicine and a dose of prednisone.   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 Denies tobacco, ETOH (quit 3 years ago), no illicit drugs Lives in [place] with a friend who works long days away from home. Patient is completely independent with ADL/IADL   Family History:  Family History  Problem Relation Age of Onset   Congestive Heart Failure Mother    Diabetes Neg Hx     Meds:  Current Meds  Medication Sig   aspirin EC 81 MG EC tablet Take 1 tablet (81 mg total) by mouth daily.   cinacalcet (SENSIPAR) 60 MG tablet Take 120 mg by mouth daily.   ferric citrate (AURYXIA) 1 GM 210 MG(Fe) tablet Take 1 tablet (210 mg total) by mouth 3 (three) times daily with meals. (Patient taking differently: Take 630 mg by mouth 3 (three) times daily with meals. )   midodrine (PROAMATINE) 10 MG tablet Take 10 mg by mouth daily.    rOPINIRole (REQUIP) 0.25 MG tablet Take 0.25-0.5 mg by mouth at bedtime.    Allergies: Allergies as of 01/10/2020   (No Known Allergies)   Past  Medical History:  Diagnosis Date   Anemia    Erectile dysfunction 2012   ESRD (end stage renal disease) ([place])    w/Left ureteral stone/hydronephrosis/notes 12/06/2017   Hepatitis 1973   "? kind"   Hyperlipidemia 2012   Hypertension    Leukocytosis    Mild CAD    a. by cath 10/2018.   Pericardial effusion    a. small by echo 10/2018.   Pulmonary nodule    a. by CT 10/2018.   Tobacco abuse 2012   1/2 pack per day   Type II diabetes mellitus ([place])      Review of Systems: A complete ROS was negative except as per HPI.   Physical Exam: Physical Exam  Constitutional: He is oriented to person, place, and time. He appears distressed.  HENT:  Head: Normocephalic and atraumatic.    Eyes: EOM are normal.  Neck: No JVD present.  Cardiovascular: Normal rate, regular rhythm, normal heart sounds and intact distal pulses. Exam reveals no gallop and no friction rub.  No murmur heard. Pulmonary/Chest: Effort normal and breath sounds normal. No respiratory distress. He has no wheezes. He has no rales. He exhibits no tenderness.  Abdominal: Soft. He exhibits no distension. There is no abdominal tenderness.  Musculoskeletal:        General: Tenderness (arm is tender to palpation) present. No edema.     Cervical back: Normal range of motion.     Comments: Spurling test positive on right.  Neurological: He is alert and oriented to person, place, and time. He displays weakness (right arm). No cranial nerve deficit. Coordination normal.  Significantly decreased weakness in the right upper extremity.  Skin: Skin is warm and dry. No rash noted. He is not diaphoretic. No erythema.    Blood pressure 132/90, pulse (!) 110, temperature 98.2 F (36.8 C), temperature source Oral, resp. rate 17, weight 75.3 kg, SpO2 95 %.  CBC Latest Ref Rng & Units 01/10/2020 08/06/2019 06/29/2019  WBC 4.0 - 10.5 K/uL 11.0(H) 10.4 9.9  Hemoglobin 13.0 - 17.0 g/dL 15.6 16.8 16.5  Hematocrit 39.0 - 52.0 % 48.6 54.4(H) 53.4(H)  Platelets 150 - 400 K/uL 399 394 366   CMP Latest Ref Rng & Units 01/10/2020 01/10/2020 08/06/2019  Glucose 70 - 99 mg/dL 191(H) 407(H) 179(H)  BUN 6 - 20 mg/dL 57(H) 54(H) 52(H)  Creatinine 0.61 - 1.24 mg/dL 9.62(H) 8.17(H) 10.59(H)  Sodium 135 - 145 mmol/L 130(L) 129(L) 136  Potassium 3.5 - 5.1 mmol/L 4.9 6.1(H) 5.2(H)  Chloride 98 - 111 mmol/L 90(L) 89(L) 93(L)  CO2 22 - 32 mmol/L 23 22 26   Calcium 8.9 - 10.3 mg/dL 8.9 8.8(L) 10.0  Total Protein 6.5 - 8.1 g/dL - 7.3 -  Total Bilirubin 0.3 - 1.2 mg/dL - 1.2 -  Alkaline Phos 38 - 126 U/L - 87 -  AST 15 - 41 U/L - 33 -  ALT 0 - 44 U/L - 24 -    EKG: personally reviewed my interpretation is sinus rhythm   CTA:  1. Normal  contour and caliber of the thoracic and abdominal aorta. No evidence of aortic aneurysm, dissection or other acute aortic pathology. Severe mixed aortic atherosclerosis. Aortic Atherosclerosis (ICD10-I70.0). 2. Bandlike scarring or atelectasis of the right middle lobe and right lung base. 3. Coronary artery disease. 4. No acute abnormalities within the abdomen or pelvis. 5. Prostatomegaly with thickening of the urinary bladder wall, likely due to chronic outlet obstruction.  CT head and cervical: 1. No acute  fracture or traumatic listhesis. 2. Degenerative disc disease at C6-7. 3. Age advanced atherosclerosis.  Disc levels: Advanced intervertebral disc height loss with degenerative endplate spurring at [id number]. Mild scattered facet arthropathy. Degenerative findings have not significantly progressed compared to the prior study  . Assessment & Plan by Problem: Active Problems:   Focal neurological deficit  Johnn Multer is a 59 y.o. male with a pPMH of ESRD (TTS), HLD, HTN, CAD, T2DM, who is admitted for acute onset right arm pain with focal weakness concerning for cervical radiculopathy.  #Right arm pain and weakness: #Radiculopathy: Patient presented with acute onset right arm pain with s/sx concerning for radiculopathy. Pain well controlled with IV dilaudid 5 mg - Ordered cervical MRI w/o contrast  - Continue Dilaudid prn  #ESRD: Patient was only able to receive 1 hour HD today. Patient had a hyperkalemia of 6.1 (hemoplysed), repeat BMP showed potassium of 4.6. Patient does not have any signs or symptoms concerning for fluid overload or uremia. - Hold of on nephrology consult   #T2DM: - SSI sensitive  Diet: renal diet VTE: Heparin IVF: none Code: DNR  Dispo: Admit patient to Observation with expected length of stay less than 2 midnights.  Signed: Marianna Payment, MD 01/10/2020, 7:44 PM  Pager: [phone]

## 2020-01-10 NOTE — ED Notes (Signed)
Fistula in right forearm has 2x2s from dialysis this am-- has good thrill,

## 2020-01-10 NOTE — Progress Notes (Signed)
Jones (IM) informed of pt's BG 518

## 2020-01-10 NOTE — Progress Notes (Signed)
Jones (IM) informed about pt's MEWS score, vs, and pain score 8/10.

## 2020-01-10 NOTE — ED Triage Notes (Signed)
Pt in with c/o RFA pain at dialysis fistula, pain radiates from fistula up to R shoulder. Pt states the pain began at 0200 this am. He went to dialysis x 1hr and pain became excruciating. Center gave him Tylenol PTA. Pain worse w/mvmt, tender to palp, pulses present. Also c/o sob

## 2020-01-10 NOTE — Consult Note (Signed)
Hospital Consult    Reason for Consult: Right arm and shoulder pain Referring Physician: Dr. Jeanell Sparrow MRN #:  [id number]  History of Present Illness: This is a 59 y.o. male with a history of end-stage renal disease currently on dialysis Tuesdays, Thursdays and Saturdays.  Patient states that he began having pain about 3 in the morning that began in the forearm on the dorsal aspect began moving up his right arm now in his shoulder.  He states that his severe 10 out of 10 pain.  He was able to dialyze earlier but was sent to the ER following this.  Patient has had interventions on the fistula in the past including thrombectomy and endovascular interventions.  Has not had any issues with dialysis.  Denies ever having this pain before.  He does not have any other associated pain.  He does states that he has severe lower back pain that has been ongoing for 6 to 7 months that prevents him from laying flat in the bed.  Past Medical History:  Diagnosis Date  . Anemia   . Erectile dysfunction 2012  . ESRD (end stage renal disease) ([place])    w/Left ureteral stone/hydronephrosis/notes 12/06/2017  . Hepatitis 1973   "? kind"  . Hyperlipidemia 2012  . Hypertension   . Leukocytosis   . Mild CAD    a. by cath 10/2018.  Marland Kitchen Pericardial effusion    a. small by echo 10/2018.  . Pulmonary nodule    a. by CT 10/2018.  . Tobacco abuse 2012   1/2 pack per day  . Type II diabetes mellitus ([place])     Past Surgical History:  Procedure Laterality Date  . APPENDECTOMY    . AV FISTULA PLACEMENT Right 12/09/2017   Procedure: ARTERIOVENOUS (AV) FISTULA CREATION;  Surgeon: Rosetta Posner, MD;  Location: [facility];  Service: Vascular;  Laterality: Right;  . CARDIAC CATHETERIZATION  05/12/2009   Archie Endo 04/07/2011  . CYSTOSCOPY/URETEROSCOPY/HOLMIUM LASER/STENT PLACEMENT Left 12/08/2017   Procedure: CYSTOSCOPY/RETROGRADE PYLEOGRAM LEFT URETEROSCOPY AND STONE EXTRACTION;  Surgeon: Festus Aloe, MD;  Location: [facility];  Service:  Urology;  Laterality: Left;  . HOLMIUM LASER APPLICATION Left [id number]   Procedure: HOLMIUM LASER APPLICATION;  Surgeon: Festus Aloe, MD;  Location: [facility];  Service: Urology;  Laterality: Left;  . INSERTION OF DIALYSIS CATHETER Right 12/09/2017   Procedure: EXCHANGE  OF TUNNELED  DIALYSIS CATHETER RIGHT INTERNAL JUGULAR.;  Surgeon: Rosetta Posner, MD;  Location: [facility];  Service: Vascular;  Laterality: Right;  . IR AV DIALY SHUNT INTRO NEEDLE/INTRACATH INITIAL W/PTA/IMG RIGHT Right 05/05/2018  . IR AV DIALY SHUNT INTRO NEEDLE/INTRACATH INITIAL W/PTA/IMG RIGHT Right 06/29/2019  . IR DIALY SHUNT INTRO NEEDLE/INTRACATH INITIAL W/IMG RIGHT Right 08/06/2019  . IR FLUORO GUIDE CV LINE RIGHT  12/07/2017  . IR REMOVAL TUN CV CATH W/O FL  06/05/2018  . IR US GUIDE VASC ACCESS RIGHT  12/07/2017  . IR US GUIDE VASC ACCESS RIGHT  05/05/2018  . IR US GUIDE VASC ACCESS RIGHT  06/29/2019  . IR US GUIDE VASC ACCESS RIGHT  08/06/2019  . LEFT HEART CATH AND CORONARY ANGIOGRAPHY N/A 10/17/2018   Procedure: LEFT HEART CATH AND CORONARY ANGIOGRAPHY;  Surgeon: Leonie Man, MD;  Location: [facility];  Service: Cardiovascular;  Laterality: N/A;  . THROMBECTOMY W/ EMBOLECTOMY Right 06/30/2018   Procedure: THROMBECTOMY and revision ARTERIOVENOUS FISTULA right RADIOCEPHALIC;  Surgeon: Angelia Mould, MD;  Location: [facility];  Service: Vascular;  Laterality: Right;  .  ULTRASOUND GUIDANCE FOR VASCULAR ACCESS  10/17/2018   Procedure: Ultrasound Guidance For Vascular Access;  Surgeon: Leonie Man, MD;  Location: [facility];  Service: Cardiovascular;;    No Known Allergies  Prior to Admission medications   Medication Sig Start Date End Date Taking? Authorizing Provider  aspirin EC 81 MG EC tablet Take 1 tablet (81 mg total) by mouth daily. 12/17/17  Yes Dana Allan I, MD  rOPINIRole (REQUIP) 0.25 MG tablet Take 0.25-0.5 mg by mouth at bedtime. 09/29/18  Yes [provider]    atorvastatin (LIPITOR) 40 MG tablet Take 1 tablet (40 mg total) by mouth at bedtime. 01/16/19   Almyra Deforest, PA  dicyclomine (BENTYL) 20 MG tablet Take 1 tablet (20 mg total) by mouth 3 (three) times daily before meals. 10/25/18   Rai, Vernelle Emerald, MD  ferric citrate (AURYXIA) 1 GM 210 MG(Fe) tablet Take 1 tablet (210 mg total) by mouth 3 (three) times daily with meals. 07/02/18   Kayleen Memos, DO  ondansetron (ZOFRAN) 4 MG tablet Take 1 tablet (4 mg total) by mouth every 6 (six) hours as needed for nausea. 10/25/18   Rai, Ripudeep K, MD  pantoprazole (PROTONIX) 40 MG tablet Take 1 tablet (40 mg total) by mouth 2 (two) times daily for 14 days. 12/20/18 01/03/19  Almyra Deforest, PA  ranitidine (ZANTAC) 150 MG tablet Take 150 mg by mouth daily. 05/24/18   [provider]  tamsulosin (FLOMAX) 0.4 MG CAPS capsule Take 1 capsule (0.4 mg total) by mouth daily. 12/16/17   Bonnell Public, MD    Social History   Socioeconomic History  . Marital status: Divorced    Spouse name: Not on file  . Number of children: Not on file  . Years of education: Not on file  . Highest education level: Not on file  Occupational History  . Not on file  Tobacco Use  . Smoking status: Former Smoker    Packs/day: 1.00    Types: Cigarettes    Quit date: 12/06/2016    Years since quitting: 3.0  . Smokeless tobacco: Never Used  Substance and Sexual Activity  . Alcohol use: No  . Drug use: No  . Sexual activity: Not Currently  Other Topics Concern  . Not on file  Social History Narrative  . Not on file   Social Determinants of Health   Financial Resource Strain:   . Difficulty of Paying Living Expenses: Not on file  Food Insecurity:   . Worried About Charity fundraiser in the Last Year: Not on file  . Ran Out of Food in the Last Year: Not on file  Transportation Needs:   . Lack of Transportation (Medical): Not on file  . Lack of Transportation (Non-Medical): Not on file  Physical Activity:   . Days of  Exercise per Week: Not on file  . Minutes of Exercise per Session: Not on file  Stress:   . Feeling of Stress : Not on file  Social Connections:   . Frequency of Communication with Friends and Family: Not on file  . Frequency of Social Gatherings with Friends and Family: Not on file  . Attends Religious Services: Not on file  . Active Member of Clubs or Organizations: Not on file  . Attends Archivist Meetings: Not on file  . Marital Status: Not on file  Intimate Partner Violence:   . Fear of Current or Ex-Partner: Not on file  . Emotionally Abused:  Not on file  . Physically Abused: Not on file  . Sexually Abused: Not on file    Family History  Problem Relation Age of Onset  . Congestive Heart Failure Mother   . Diabetes Neg Hx     ROS:  Cardiovascular: []  chest pain/pressure []  palpitations []  SOB lying flat []  DOE []  pain in legs while walking []  pain in legs at rest []  pain in legs at night []  non-healing ulcers []  hx of DVT []  swelling in legs  Pulmonary: []  productive cough []  asthma/wheezing []  home O2  Neurologic: []  weakness in []  arms []  legs []  numbness in []  arms []  legs []  hx of CVA []  mini stroke [] difficulty speaking or slurred speech []  temporary loss of vision in one eye []  dizziness  Hematologic: []  hx of cancer []  bleeding problems []  problems with blood clotting easily  Endocrine:   []  diabetes []  thyroid disease  GI []  vomiting blood []  blood in stool  GU: [x]  CKD/renal failure []  HD--[]  M/W/F or [x]  T/T/S []  burning with urination []  blood in urine  Psychiatric: []  anxiety []  depression  Musculoskeletal: []  arthritis [x]  joint pain  Integumentary: []  rashes []  ulcers  Constitutional: []  fever []  chills   Physical Examination  Vitals:   01/10/20 0840  BP: 132/77  Pulse: (!) 117  Resp: (!) 24  Temp: 98.3 F (36.8 C)  SpO2: 98%   Body mass index is 26 kg/m.  General: He appears in moderate  distress sitting on the side of the stretcher HENT: WNL, normocephalic Pulmonary: normal non-labored breathing Cardiac: He has palpable radial pulses bilaterally Abdomen: soft, NT/ND, no masses Extremities: Both arms appear well-perfused, right arm AV fistula has strong thrill with clean bandage at site of recent dialysis Neurologic: A&O X 3; Appropriate Affect ; SENSATION: normal; MOTOR FUNCTION:  moving all extremities equally. Speech is fluent/normal   CBC    Component Value Date/Time   WBC 11.0 (H) 01/10/2020 0850   RBC 5.86 (H) 01/10/2020 0850   HGB 15.6 01/10/2020 0850   HCT 48.6 01/10/2020 0850   PLT 399 01/10/2020 0850   MCV 82.9 01/10/2020 0850   MCH 26.6 01/10/2020 0850   MCHC 32.1 01/10/2020 0850   RDW 16.2 (H) 01/10/2020 0850   LYMPHSABS 2.5 06/29/2019 1135   MONOABS 0.8 06/29/2019 1135   EOSABS 0.3 06/29/2019 1135   BASOSABS 0.1 06/29/2019 1135    BMET    Component Value Date/Time   NA 129 (L) 01/10/2020 0850   K 6.1 (H) 01/10/2020 0850   CL 89 (L) 01/10/2020 0850   CO2 22 01/10/2020 0850   GLUCOSE 407 (H) 01/10/2020 0850   BUN 54 (H) 01/10/2020 0850   CREATININE 8.17 (H) 01/10/2020 0850   CALCIUM 8.8 (L) 01/10/2020 0850   GFRNONAA 6 (L) 01/10/2020 0850   GFRAA 8 (L) 01/10/2020 0850    COAGS: Lab Results  Component Value Date   INR 1.0 08/06/2019   INR 1.1 06/29/2019   INR 1.25 12/07/2017     Non-Invasive Vascular Imaging:   No studies   ASSESSMENT/PLAN: This is a 59 y.o. male here with acute onset of severe right upper extremity and now shoulder pain.  This does not appear related to his dialysis access as this has strong thrill in the forearm there are no external abnormalities in the upper arm or shoulder to suggest trauma or issues with the access there.  He was able to use his dialysis access earlier today.  Vascular surgery will be available as needed.  Redford Behrle C. Donzetta Matters, MD [facility] Office:  [phone] Pager: [phone]

## 2020-01-10 NOTE — ED Notes (Signed)
Pt transported to CT ?

## 2020-01-10 NOTE — ED Notes (Signed)
Dr Donzetta Matters here to examine

## 2020-01-10 NOTE — ED Provider Notes (Addendum)
[facility] EMERGENCY DEPARTMENT Provider Note   CSN: [id number] Arrival date & time: 01/10/20  [id number]     History Chief Complaint  Patient presents with  . Arm Pain  . Shortness of Breath    Fernando Jones is a 59 y.o. male.  HPI  59 year old male end-stage renal disease on dialysis Tuesday Thursday Saturday, presents today complaining of right arm pain.  He states that he had some pain during the night and on awakening.  The pain in the arm worsened while he was at dialysis.  He describes it as from the elbow up through the shoulder and radiating to the right trapezius.  It is sharp and severe.  It worsened some with movement of the arm.  He denies any numbness, tingling, redness, swelling, signs or symptoms of abscess.  He has had an AV fistula in this arm for an extended period of time.  Denies any prior problems with this.  He denies any known trauma to his arm, head, or neck.  Denies having similar pain in the past.  Pain is currently 10 out of 10.  He has not taken anything for this.  He denies anything that makes it better or worse.     Past Medical History:  Diagnosis Date  . Anemia   . Erectile dysfunction 2012  . ESRD (end stage renal disease) ([place])    w/Left ureteral stone/hydronephrosis/notes 12/06/2017  . Hepatitis 1973   "? kind"  . Hyperlipidemia 2012  . Hypertension   . Leukocytosis   . Mild CAD    a. by cath 10/2018.  Marland Kitchen Pericardial effusion    a. small by echo 10/2018.  . Pulmonary nodule    a. by CT 10/2018.  . Tobacco abuse 2012   1/2 pack per day  . Type II diabetes mellitus [facility])     Patient Active Problem List   Diagnosis Date Noted  . Chest pain 10/14/2018  . Hyperkalemia 06/29/2018  . Malnutrition of moderate degree 12/15/2017  . ESRD (end stage renal disease) ([place]) 12/06/2017  . Severe Vitamin D deficiency 10/14/2016  . Diastolic dysfunction with acute on chronic heart failure ([place]) 10/13/2016  . Acute respiratory  failure with hypoxia ([place]) 10/12/2016  . Anasarca 10/12/2016  . Acute renal failure (ARF) ([place]) 10/12/2016  . Hypertensive urgency 10/12/2016  . Acute respiratory failure ([place]) 10/12/2016  . ERECTILE DYSFUNCTION, NON-ORGANIC 06/23/2009  . DM (diabetes mellitus), type 2 with renal complications ([place]) [id number]  . HYPERLIPIDEMIA 05/20/2009  . TOBACCO ABUSE 05/20/2009  . Essential hypertension 05/20/2009  . CONSTIPATION 05/20/2009    Past Surgical History:  Procedure Laterality Date  . APPENDECTOMY    . AV FISTULA PLACEMENT Right 12/09/2017   Procedure: ARTERIOVENOUS (AV) FISTULA CREATION;  Surgeon: Rosetta Posner, MD;  Location: [facility];  Service: Vascular;  Laterality: Right;  . CARDIAC CATHETERIZATION  05/12/2009   Archie Endo 04/07/2011  . CYSTOSCOPY/URETEROSCOPY/HOLMIUM LASER/STENT PLACEMENT Left 12/08/2017   Procedure: CYSTOSCOPY/RETROGRADE PYLEOGRAM LEFT URETEROSCOPY AND STONE EXTRACTION;  Surgeon: Festus Aloe, MD;  Location: [facility];  Service: Urology;  Laterality: Left;  . HOLMIUM LASER APPLICATION Left [id number]   Procedure: HOLMIUM LASER APPLICATION;  Surgeon: Festus Aloe, MD;  Location: [facility];  Service: Urology;  Laterality: Left;  . INSERTION OF DIALYSIS CATHETER Right 12/09/2017   Procedure: EXCHANGE  OF TUNNELED  DIALYSIS CATHETER RIGHT INTERNAL JUGULAR.;  Surgeon: Rosetta Posner, MD;  Location: [facility];  Service: Vascular;  Laterality: Right;  . IR AV  DIALY SHUNT INTRO NEEDLE/INTRACATH INITIAL W/PTA/IMG RIGHT Right 05/05/2018  . IR AV DIALY SHUNT INTRO NEEDLE/INTRACATH INITIAL W/PTA/IMG RIGHT Right 06/29/2019  . IR DIALY SHUNT INTRO NEEDLE/INTRACATH INITIAL W/IMG RIGHT Right 08/06/2019  . IR FLUORO GUIDE CV LINE RIGHT  12/07/2017  . IR REMOVAL TUN CV CATH W/O FL  06/05/2018  . IR US GUIDE VASC ACCESS RIGHT  12/07/2017  . IR US GUIDE VASC ACCESS RIGHT  05/05/2018  . IR US GUIDE VASC ACCESS RIGHT  06/29/2019  . IR US GUIDE VASC ACCESS RIGHT  08/06/2019  . LEFT HEART CATH AND CORONARY  ANGIOGRAPHY N/A 10/17/2018   Procedure: LEFT HEART CATH AND CORONARY ANGIOGRAPHY;  Surgeon: Leonie Man, MD;  Location: [facility];  Service: Cardiovascular;  Laterality: N/A;  . THROMBECTOMY W/ EMBOLECTOMY Right 06/30/2018   Procedure: THROMBECTOMY and revision ARTERIOVENOUS FISTULA right RADIOCEPHALIC;  Surgeon: Angelia Mould, MD;  Location: [facility];  Service: Vascular;  Laterality: Right;  . ULTRASOUND GUIDANCE FOR VASCULAR ACCESS  10/17/2018   Procedure: Ultrasound Guidance For Vascular Access;  Surgeon: Leonie Man, MD;  Location: [facility];  Service: Cardiovascular;;       Family History  Problem Relation Age of Onset  . Congestive Heart Failure Mother   . Diabetes Neg Hx     Social History   Tobacco Use  . Smoking status: Former Smoker    Packs/day: 1.00    Types: Cigarettes    Quit date: 12/06/2016    Years since quitting: 3.0  . Smokeless tobacco: Never Used  Substance Use Topics  . Alcohol use: No  . Drug use: No    Home Medications Prior to Admission medications   Medication Sig Start Date End Date Taking? Authorizing Provider  aspirin EC 81 MG EC tablet Take 1 tablet (81 mg total) by mouth daily. 12/17/17   Bonnell Public, MD  atorvastatin (LIPITOR) 40 MG tablet Take 1 tablet (40 mg total) by mouth at bedtime. 01/16/19   Almyra Deforest, PA  dicyclomine (BENTYL) 20 MG tablet Take 1 tablet (20 mg total) by mouth 3 (three) times daily before meals. 10/25/18   Rai, Vernelle Emerald, MD  ferric citrate (AURYXIA) 1 GM 210 MG(Fe) tablet Take 1 tablet (210 mg total) by mouth 3 (three) times daily with meals. 07/02/18   Kayleen Memos, DO  ondansetron (ZOFRAN) 4 MG tablet Take 1 tablet (4 mg total) by mouth every 6 (six) hours as needed for nausea. 10/25/18   Rai, Ripudeep K, MD  pantoprazole (PROTONIX) 40 MG tablet Take 1 tablet (40 mg total) by mouth 2 (two) times daily for 14 days. 12/20/18 01/03/19  Almyra Deforest, PA  ranitidine (ZANTAC) 150 MG tablet  Take 150 mg by mouth daily. 05/24/18   [provider]  rOPINIRole (REQUIP) 0.25 MG tablet Take 0.25-0.5 mg by mouth at bedtime. 09/29/18   [provider]  tamsulosin (FLOMAX) 0.4 MG CAPS capsule Take 1 capsule (0.4 mg total) by mouth daily. 12/16/17   Bonnell Public, MD    Allergies    Patient has no known allergies.  Review of Systems   Review of Systems  All other systems reviewed and are negative.   Physical Exam Updated Vital Signs BP 132/77 (BP Location: Left Arm)   Pulse (!) 117   Temp 98.3 F (36.8 C) (Oral)   Resp (!) 24   Wt 75.3 kg   SpO2 98%   BMI 26.00 kg/m   Physical Exam  Vitals and nursing note reviewed.  Constitutional:      Appearance: He is well-developed.  HENT:     Head: Normocephalic and atraumatic.     Mouth/Throat:     Mouth: Mucous membranes are moist.  Eyes:     Pupils: Pupils are equal, round, and reactive to light.  Cardiovascular:     Rate and Rhythm: Regular rhythm. Tachycardia present.  Pulmonary:     Effort: Pulmonary effort is normal.     Breath sounds: Normal breath sounds.  Abdominal:     General: Bowel sounds are normal.     Palpations: Abdomen is soft.  Musculoskeletal:     Cervical back: Normal range of motion.     Comments: AV fistula right forearm with thrill present No tenderness or erythema noted There is some tenderness to palpation of the right bicep and some resistance to extending or flexing the bicep or moving the shoulder. No obvious signs of trauma No redness or swelling noted Radial pulses are 2+ Sensation is intact Neck is supple and nontender  Skin:    General: Skin is warm and dry.     Capillary Refill: Capillary refill takes less than 2 seconds.  Neurological:     General: No focal deficit present.     Mental Status: He is alert and oriented to person, place, and time.  Psychiatric:        Mood and Affect: Mood normal.        Behavior: Behavior normal.     ED Results /  Procedures / Treatments   Labs (all labs ordered are listed, but only abnormal results are displayed) Labs Reviewed  CBC - Abnormal; Notable for the following components:      Result Value   WBC 11.0 (*)    RBC 5.86 (*)    RDW 16.2 (*)    All other components within normal limits  SARS CORONAVIRUS 2 (TAT 6-24 HRS)  COMPREHENSIVE METABOLIC PANEL    EKG EKG Interpretation  Date/Time:  Thursday January 10 2020 08:36:00 EST Ventricular Rate:  119 PR Interval:    QRS Duration: 96 QT Interval:  324 QTC Calculation: 456 R Axis:   68 Text Interpretation: Sinus tachycardia Consider right atrial enlargement Abnormal T, consider ischemia, lateral leads SINCE LAST TRACING HEART RATE HAS INCREASED Confirmed by Pattricia Boss [phone]) on 01/10/2020 8:40:38 AM   Radiology CT Head Wo Contrast  Result Date: 01/10/2020 CLINICAL DATA:  Right arm pain radiating into the left shoulder, neck, and head EXAM: CT HEAD WITHOUT CONTRAST TECHNIQUE: Contiguous axial images were obtained from the base of the skull through the vertex without intravenous contrast. COMPARISON:  CT 07/03/2011 FINDINGS: Brain: No evidence of acute infarction, hemorrhage, hydrocephalus, extra-axial collection or mass lesion/mass effect. Small focal areas of encephalomalacia in the left basal ganglia related to remote lacunar infarcts. Scattered low-density changes within the periventricular and subcortical white matter compatible with chronic microvascular ischemic change. Mild diffuse cerebral volume loss. Vascular: Mild atherosclerotic calcifications involving the large vessels of the skull base. No unexpected hyperdense vessel. Skull: Normal. Negative for fracture or focal lesion. Sinuses/Orbits: No acute finding. Other: None. IMPRESSION: 1.  No acute intracranial findings. 2.  Chronic microvascular ischemic change and cerebral volume loss. Electronically Signed   By: Davina Poke D.O.   On: 01/10/2020 09:42   CT Cervical Spine Wo  Contrast  Result Date: 01/10/2020 CLINICAL DATA:  Right shoulder and neck pain EXAM: CT CERVICAL SPINE WITHOUT CONTRAST TECHNIQUE: Multidetector CT imaging of  the cervical spine was performed without intravenous contrast. Multiplanar CT image reconstructions were also generated. COMPARISON:  CT 10/25/2018 FINDINGS: Alignment: Straightening of the cervical lordosis. No traumatic listhesis. Facet joints are aligned. Dens and lateral masses are aligned. Skull base and vertebrae: No acute fracture. No primary bone lesion or focal pathologic process. Soft tissues and spinal canal: No prevertebral fluid or swelling. No visible canal hematoma. Disc levels: Advanced intervertebral disc height loss with degenerative endplate spurring at D34-534. Mild scattered facet arthropathy. Degenerative findings have not significantly progressed compared to the prior study. Upper chest: Visualized lung apices clear. Other: Advanced arterial atherosclerotic calcifications. IMPRESSION: 1. No acute fracture or traumatic listhesis. 2. Degenerative disc disease at C6-7. 3. Age advanced atherosclerosis. Electronically Signed   By: Davina Poke D.O.   On: 01/10/2020 09:45   DG Chest Port 1 View  Result Date: 01/10/2020 CLINICAL DATA:  Sharp chest pain at site of dialysis fistula in right upper extremity, short of breath EXAM: PORTABLE CHEST 1 VIEW COMPARISON:  10/14/2018 FINDINGS: The heart size and mediastinal contours are within normal limits. Both lungs are clear. The visualized skeletal structures are unremarkable. IMPRESSION: No active disease. Electronically Signed   By: Randa Ngo M.D.   On: 01/10/2020 09:04    Procedures .Critical Care Performed by: Pattricia Boss, MD Authorized by: Pattricia Boss, MD   Critical care provider statement:    Critical care time (minutes):  45   Critical care end time:  01/10/2020 3:54 PM   Critical care was time spent personally by me on the following activities:  Discussions with  consultants, evaluation of patient's response to treatment, examination of patient, ordering and performing treatments and interventions, ordering and review of laboratory studies, ordering and review of radiographic studies, pulse oximetry, re-evaluation of patient's condition, obtaining history from patient or surrogate and review of old charts   (including critical care time)  Medications Ordered in ED Medications  morphine 2 MG/ML injection 2 mg (2 mg Intravenous Given 01/10/20 0854)    ED Course  I have reviewed the triage vital signs and the nursing notes.  Pertinent labs & imaging results that were available during my care of the patient were reviewed by me and considered in my medical decision making (see chart for details).    MDM Rules/Calculators/A&P                      10:40 AM Discussed with Dr. Donzetta Matters at bedside- he does not think it is related to fistula. Continues tachycardiac at 106, pain severe Patient able to flex, extend wrist and elbow Pulses remain intact Consider mi/cardiac/ cervical spine/ decreased blood flow Troponin pending- ekg no acute Cervical spine imaged with ct- some djd Plan cta chest abdomen-r/u dissection-no acute findings ekg with sttwave changes lateral leads Troponin slightly elevate  Cardiology consulted Nitro ordered Pain has been severe but intermittently resolved with ms,now returning.  Right arm pain- ?cardiac vs neck pain,  Will repeat ms and give steroids, patient may need mri, but no acute neuro abnormality here. Elevated troponin-ekg abnormal, mildly elevated troponin,  No change with nitro, discussed with Dr. Harrell Gave, she advises  Patient has had similar ekg changes before and trops elevated c.w. his renal failureesrd on dialysis Hypertension Discussed with Dr. Tarri Abernethy and will see for admission  Final Clinical Impression(s) / ED Diagnoses Final diagnoses:  Pain of right upper extremity  ESRD (end stage renal disease) ([place])     Rx / DC Orders  ED Discharge Orders    None       Pattricia Boss, MD 01/10/20 1554    Pattricia Boss, MD 01/10/20 [phone]

## 2020-01-11 ENCOUNTER — Observation Stay (HOSPITAL_COMMUNITY): Payer: Self-pay

## 2020-01-11 ENCOUNTER — Inpatient Hospital Stay (HOSPITAL_COMMUNITY): Payer: Self-pay

## 2020-01-11 DIAGNOSIS — M50123 Cervical disc disorder at C6-C7 level with radiculopathy: Principal | ICD-10-CM

## 2020-01-11 DIAGNOSIS — M79601 Pain in right arm: Secondary | ICD-10-CM | POA: Diagnosis present

## 2020-01-11 DIAGNOSIS — E1165 Type 2 diabetes mellitus with hyperglycemia: Secondary | ICD-10-CM

## 2020-01-11 DIAGNOSIS — D72829 Elevated white blood cell count, unspecified: Secondary | ICD-10-CM

## 2020-01-11 DIAGNOSIS — M5412 Radiculopathy, cervical region: Secondary | ICD-10-CM | POA: Diagnosis present

## 2020-01-11 DIAGNOSIS — D631 Anemia in chronic kidney disease: Secondary | ICD-10-CM

## 2020-01-11 LAB — BASIC METABOLIC PANEL
Anion gap: 19 — ABNORMAL HIGH (ref 5–15)
BUN: 74 mg/dL — ABNORMAL HIGH (ref 6–20)
CO2: 26 mmol/L (ref 22–32)
Calcium: 9.6 mg/dL (ref 8.9–10.3)
Chloride: 88 mmol/L — ABNORMAL LOW (ref 98–111)
Creatinine, Ser: 10.98 mg/dL — ABNORMAL HIGH (ref 0.61–1.24)
GFR calc Af Amer: 5 mL/min — ABNORMAL LOW (ref >60)
GFR calc non Af Amer: 5 mL/min — ABNORMAL LOW (ref >60)
Glucose, Bld: 255 mg/dL — ABNORMAL HIGH (ref 70–99)
Potassium: 5.4 mmol/L — ABNORMAL HIGH (ref 3.5–5.1)
Sodium: 133 mmol/L — ABNORMAL LOW (ref 135–145)

## 2020-01-11 LAB — GLUCOSE, CAPILLARY
Glucose-Capillary: 193 mg/dL — ABNORMAL HIGH (ref 70–99)
Glucose-Capillary: 272 mg/dL — ABNORMAL HIGH (ref 70–99)
Glucose-Capillary: 282 mg/dL — ABNORMAL HIGH (ref 70–99)
Glucose-Capillary: 319 mg/dL — ABNORMAL HIGH (ref 70–99)
Glucose-Capillary: 460 mg/dL — ABNORMAL HIGH (ref 70–99)

## 2020-01-11 LAB — CBC
HCT: 49.5 % (ref 39.0–52.0)
Hemoglobin: 15.7 g/dL (ref 13.0–17.0)
MCH: 26.3 pg (ref 26.0–34.0)
MCHC: 31.7 g/dL (ref 30.0–36.0)
MCV: 83.1 fL (ref 80.0–100.0)
Platelets: 454 10*3/uL — ABNORMAL HIGH (ref 150–400)
RBC: 5.96 MIL/uL — ABNORMAL HIGH (ref 4.22–5.81)
RDW: 16.4 % — ABNORMAL HIGH (ref 11.5–15.5)
WBC: 11.9 10*3/uL — ABNORMAL HIGH (ref 4.0–10.5)
nRBC: 0 % (ref 0.0–0.2)

## 2020-01-11 LAB — MRSA PCR SCREENING: MRSA by PCR: POSITIVE — AB

## 2020-01-11 LAB — HEMOGLOBIN A1C
Hgb A1c MFr Bld: 12 % — ABNORMAL HIGH (ref 4.8–5.6)
Mean Plasma Glucose: 297.7 mg/dL

## 2020-01-11 MED ORDER — DIPHENHYDRAMINE HCL 25 MG PO CAPS
ORAL_CAPSULE | ORAL | Status: AC
  Filled 2020-01-11: qty 1

## 2020-01-11 MED ORDER — MUPIROCIN 2 % EX OINT
1.0000 "application " | TOPICAL_OINTMENT | Freq: Two times a day (BID) | CUTANEOUS | Status: DC
  Administered 2020-01-11 – 2020-01-15 (×8): 1 via NASAL
  Filled 2020-01-11: qty 22

## 2020-01-11 MED ORDER — HYDROMORPHONE HCL 2 MG PO TABS
2.0000 mg | ORAL_TABLET | ORAL | Status: DC | PRN
  Administered 2020-01-11 – 2020-01-13 (×7): 2 mg via ORAL
  Filled 2020-01-11 (×6): qty 1

## 2020-01-11 MED ORDER — MIDODRINE HCL 5 MG PO TABS: 10.0000 mg | ORAL_TABLET | Freq: Once | ORAL | Status: AC | PRN

## 2020-01-11 MED ORDER — DULOXETINE HCL 30 MG PO CPEP
30.0000 mg | ORAL_CAPSULE | Freq: Every day | ORAL | Status: DC
  Administered 2020-01-11 – 2020-01-15 (×5): 30 mg via ORAL
  Filled 2020-01-11 (×5): qty 1

## 2020-01-11 MED ORDER — GABAPENTIN 600 MG PO TABS
300.0000 mg | ORAL_TABLET | Freq: Every day | ORAL | Status: DC
  Administered 2020-01-11 – 2020-01-15 (×5): 300 mg via ORAL
  Filled 2020-01-11 (×5): qty 1

## 2020-01-11 MED ORDER — HYDROMORPHONE HCL 1 MG/ML IJ SOLN
0.5000 mg | Freq: Three times a day (TID) | INTRAMUSCULAR | Status: DC | PRN
  Administered 2020-01-11 – 2020-01-12 (×3): 0.5 mg via INTRAVENOUS
  Filled 2020-01-11 (×3): qty 0.5

## 2020-01-11 MED ORDER — INSULIN GLARGINE 100 UNIT/ML ~~LOC~~ SOLN
12.0000 [IU] | Freq: Every day | SUBCUTANEOUS | Status: DC
  Administered 2020-01-11 – 2020-01-15 (×5): 12 [IU] via SUBCUTANEOUS
  Filled 2020-01-11 (×5): qty 0.12

## 2020-01-11 MED ORDER — INSULIN ASPART 100 UNIT/ML ~~LOC~~ SOLN
6.0000 [IU] | Freq: Once | SUBCUTANEOUS | Status: AC
  Administered 2020-01-11: 6 [IU] via SUBCUTANEOUS

## 2020-01-11 MED ORDER — ONDANSETRON HCL 4 MG/2ML IJ SOLN
4.0000 mg | Freq: Four times a day (QID) | INTRAMUSCULAR | Status: DC | PRN
  Administered 2020-01-11 – 2020-01-14 (×6): 4 mg via INTRAVENOUS
  Filled 2020-01-11 (×5): qty 2

## 2020-01-11 MED ORDER — HEPARIN SODIUM (PORCINE) 1000 UNIT/ML IJ SOLN
INTRAMUSCULAR | Status: AC
  Administered 2020-01-11: 5000 [IU]
  Filled 2020-01-11: qty 5

## 2020-01-11 MED ORDER — DIPHENHYDRAMINE HCL 50 MG/ML IJ SOLN
INTRAMUSCULAR | Status: AC
  Filled 2020-01-11: qty 1

## 2020-01-11 MED ORDER — DOXERCALCIFEROL 4 MCG/2ML IV SOLN
1.0000 ug | Freq: Once | INTRAVENOUS | Status: AC
  Administered 2020-01-12: 1 ug via INTRAVENOUS
  Filled 2020-01-11: qty 2

## 2020-01-11 MED ORDER — CHLORHEXIDINE GLUCONATE CLOTH 2 % EX PADS
6.0000 | MEDICATED_PAD | Freq: Every day | CUTANEOUS | Status: DC
  Administered 2020-01-11 – 2020-01-12 (×2): 6 via TOPICAL

## 2020-01-11 MED ORDER — HYDROMORPHONE HCL 2 MG PO TABS
ORAL_TABLET | ORAL | Status: AC
  Administered 2020-01-11: 2 mg via ORAL
  Filled 2020-01-11: qty 1

## 2020-01-11 MED ORDER — INSULIN ASPART 100 UNIT/ML ~~LOC~~ SOLN: 4.0000 [IU] | Freq: Once | SUBCUTANEOUS | Status: DC

## 2020-01-11 MED ORDER — HEPARIN SODIUM (PORCINE) 1000 UNIT/ML IJ SOLN
INTRAMUSCULAR | Status: AC
  Administered 2020-01-11: 14:00:00 2500 [IU]
  Filled 2020-01-11: qty 3

## 2020-01-11 NOTE — Progress Notes (Signed)
Asked by Diabetes coordinator to follow for medication assistance at d/c. She states he is going to need to be on insulin at d/c. MD updated but wants to see how he does on the insulin and make changes as necessary.  TOC to follow for [facility] assistance and other d/c needs.

## 2020-01-11 NOTE — Progress Notes (Signed)
Brief Consult Note  Fernando Jones [id number]  ESRD patient on HD MWF admitted to observation due to new RUE pain and weakness.  VVS consulted and do not believe pain is related to dialysis access.  Pertinent findigns include K 5.4, MRI showing degenerative disc disease with spinal and foraminal stenosis at C6-7 likely resulting in radicular symptoms.  Completed 1hr of dialysis yesterday.  Patient seen and examined at bedside in dialysis.  Continues to have RUE pain and weakness.  SOB improved.  Denies CP, n/v/d, abdominal, pain, dizziness and fatigue.   Plan:  Order written for 3hrs HD today, tolerated well so far. Ok to d/c from renal stand point after dialysis.  Can resume OP dialysis tomorrow.  If changed to inpatient status will complete formal consult.   OP HD orders: TTS - [facility] 4hrs  400/800 EDW 77.5kg RU AVF Hectorol 71mcg qHD Hep 5000 unit bolus, 2500 units intermittent  Jen Mow, PA-C [facility] Pager: [phone]

## 2020-01-11 NOTE — Progress Notes (Signed)
Pt's HR 129 post ambulating from bathroom, pain 8/10. Prn pain med admin. Jones (IM) informed.

## 2020-01-11 NOTE — Plan of Care (Signed)

## 2020-01-11 NOTE — Progress Notes (Signed)
Paged to the patients room. Patient was only able to tolerate 1.5 hours of HD due to pain. He states that during HD he gets muscle cramps throughout his body including his stomach and his right arm pain becomes worse. He has associated pruritis that remains for almost 2 hours after complete HD that he had a difficult time tolerating. During the interview, he becomes teary eyed and asks if there is any medication he can take to end his life. He denies suicidal intent, but states that he cannot bare the pain anymore. He states that he is constantly fatigued and is unable to go anywhere due to poor exercise tolerance and fatigue. I currently have him on oral dilaudid for maintenance  pain medicine and IV dilaudid for breakthrough. He also has gabapentin for neuropathic pain.  Today's Vitals   01/11/20 1230 01/11/20 1300 01/11/20 1330 01/11/20 1555  BP: (!) 175/91 (!) 157/96 139/88 123/90  Pulse: 98 96 (!) 105 (!) 107  Resp:    (!) 22  Temp:    97.9 F (36.6 C)  TempSrc:    Oral  SpO2:    99%  Weight:      PainSc:   8     Body mass index is 27.31 kg/m.   Plan: - I will add duloxetine for neuropathic pain and depression. - I will continue to monitor his pain management in order to help him tolerate HD.   Marianna Payment, D.O. Date 01/11/2020 Time 4:00 PM [facility], PGY-1

## 2020-01-11 NOTE — Progress Notes (Addendum)
Subjective: HD#0 Events Overnight: no events overnight  Patient was seen this morning on rounds. Continues to have significant pain. It is minorly improved in severity but same quality. He has noticed some itching in his arm as well. This seems to be new. Still have significant discomfort with moving his arm and with touch. Discussed the plan to start some additional medications for pain. All questions and concerns addressed.   Objective:  Vital signs in last 24 hours: Vitals:   01/11/20 1200 01/11/20 1230 01/11/20 1300 01/11/20 1330  BP: (!) 179/98 (!) 175/91 (!) 157/96 139/88  Pulse: (!) 103 98 96 (!) 105  Resp:      Temp:      TempSrc:      SpO2:      Weight:        Physical Exam: Physical Exam  Constitutional: He is oriented to person, place, and time. He appears distressed.  HENT:  Head: Normocephalic and atraumatic.  Eyes: EOM are normal.  Cardiovascular: Normal rate, regular rhythm, normal heart sounds and intact distal pulses.  Pulmonary/Chest: Effort normal and breath sounds normal. No respiratory distress. He exhibits no tenderness.  Abdominal: Soft. He exhibits no distension. There is abdominal tenderness.  Musculoskeletal:     Right shoulder: Tenderness present. No swelling or deformity. Decreased range of motion. Decreased strength. Normal pulse.     Cervical back: Normal range of motion.  Neurological: He is oriented to person, place, and time.  Skin: Skin is warm and dry. He is not diaphoretic.    Filed Weights   01/10/20 0837 01/11/20 0410 01/11/20 1136  Weight: 75.3 kg 80.1 kg 79.1 kg    No intake or output data in the 24 hours ending 01/11/20 1424  Pertinent labs/Imaging: CBC Latest Ref Rng & Units 01/11/2020 01/10/2020 08/06/2019  WBC 4.0 - 10.5 K/uL 11.9(H) 11.0(H) 10.4  Hemoglobin 13.0 - 17.0 g/dL 15.7 15.6 16.8  Hematocrit 39.0 - 52.0 % 49.5 48.6 54.4(H)  Platelets 150 - 400 K/uL 454(H) 399 394    CMP Latest Ref Rng & Units 01/11/2020 01/10/2020  01/10/2020  Glucose 70 - 99 mg/dL 255(H) 191(H) 407(H)  BUN 6 - 20 mg/dL 74(H) 57(H) 54(H)  Creatinine 0.61 - 1.24 mg/dL 10.98(H) 9.62(H) 8.17(H)  Sodium 135 - 145 mmol/L 133(L) 130(L) 129(L)  Potassium 3.5 - 5.1 mmol/L 5.4(H) 4.9 6.1(H)  Chloride 98 - 111 mmol/L 88(L) 90(L) 89(L)  CO2 22 - 32 mmol/L 26 23 22   Calcium 8.9 - 10.3 mg/dL 9.6 8.9 8.8(L)  Total Protein 6.5 - 8.1 g/dL - - 7.3  Total Bilirubin 0.3 - 1.2 mg/dL - - 1.2  Alkaline Phos 38 - 126 U/L - - 87  AST 15 - 41 U/L - - 33  ALT 0 - 44 U/L - - 24   MR of cervical spine: Cervical spine degenerative changes which appear chronic. No evidence of fracture or infection in the cervical spine Mild spinal stenosis and mild foraminal stenosis bilaterally C5-6 Spondylosis C6-7 with mild spinal stenosis and mild foraminal stenosis bilaterally.   Assessment/Plan:  Principal Problem:   Right arm pain Active Problems:   DM (diabetes mellitus), type 2 with renal complications ([facility])   ESRD (end stage renal disease) ([place])    Patient Summary: Fernando Jones is a 59 y.o. with pertinent PMH of ESRD (TTS), HLD, HTN, CAD, T2DM, admit for right arm weakness and pain 2/2 to cervical radiculopathy on hospital day 0  #Right arm pain #Cervial Radiculopathy Patient MRI  shows significant degenerative disc disease with spinal and foraminal stenosis at C6-C7. This is likely what is causing his radicular symptoms. There is no emergent need for neurosurgery consult.  - Switch to oral pain management with Dilaudid 2mg  po q 4 hours for severe pain. - Added dilaudid 0.5 mg q 8 hours for breakthroughs pain. - Added gabapentin for neuropathic pain - Should XR is WNL  #ESRD anuric: Patient only received 1 hour of HD yesterday at home facility. His potassium is elevated at 5.4 today.  -  Consulted nephrology for inpatient HD - Consulted Palliative care for goals of care conversation.  - continue I/O, fluid restriction, renal diet   #DM: Hgb A1C  is 12, which is falsely low in the setting of ESRD. This is consistent with is med rec, which shows that the patient is not taking anything for DM. Patient has been hyperglycemic likely secondary to uncontrolled diabetes and prednisone therapy. He was given a dose of 10 units of novolog overnight and continue to have elevated blood sugars this morning. He is currently on SSI sensitive. He will need to be discharged on insulin therapy.  - Continue SSI sensitive. - Lantus 12 units daily  #Leukocytosis: - Likely secondary to prednisone therapy  #Anemia of Chronic Kidney disease: - Hgb of 11.9 and MCV of 83.1  Diet: Renal IVF: none VTE: heaprin Code: DNR  Dispo: Anticipated discharge today.    Marianna Payment, D.O. [facility], PGY-1 Date 01/11/2020 Time 2:24 PM Pager: [phone]

## 2020-01-11 NOTE — Progress Notes (Addendum)
Inpatient Diabetes Program Recommendations  AACE/ADA: New Consensus Statement on Inpatient Glycemic Control (2015)  Target Ranges:  Prepandial:   less than 140 mg/dL      Peak postprandial:   less than 180 mg/dL (1-2 hours)      Critically ill patients:  140 - 180 mg/dL   Lab Results  Component Value Date   GLUCAP 319 (H) 01/11/2020   HGBA1C 12.0 (H) 01/10/2020    Review of Glycemic Control  Diabetes history: DM 2 Outpatient Diabetes medications: None noted Current orders for Inpatient glycemic control:  Novolog 0-6 units tid  A1c 12%  Inpatient Diabetes Program Recommendations:    Pt would benefit from Levemir 10 units Q24 hours.   Note pt without insurance will need to make sure pt has good follow up and a clinic where he can obtain his insulins.  Will speak with pt today.  Addendum: Spoke with patient at bedside in HD with the help of interpreter Fernando Jones [phone]) Pt reports needing a glucometer to check his glucose at home. Pt reports having a hx of DM but not taking any medications for it. Pt has been on insulin pens in the past.  Discussed current A1c 12%. Discussed glucose and A1c goals. Patient had questions about why his A1c is so high even though he has dialysis. Discussed that even though HD helps, his body needs to prevent the glucose trends from spiking. Pt reports needing help with medications and follow up.  Pt may meet the requirements for the dispensary of [facility] with the [facility] to get Basaglar and Humalog for free once established. Since pt is d/c'ing over the weekend, pt will be provided a match letter from [facility] team.   Gave glucometer from [facility] [facility] to pt.  Thanks,  Tama Headings RN, MSN, BC-ADM Inpatient Diabetes Coordinator Team Pager [phone] (8a-5p)

## 2020-01-11 NOTE — Progress Notes (Signed)
BG rechecked at 2300 and 0000 per Jones (IM)

## 2020-01-11 NOTE — Progress Notes (Signed)
Potassium 5.4, Jones (IM) informed.

## 2020-01-12 LAB — CBC
HCT: 47.9 % (ref 39.0–52.0)
Hemoglobin: 15.1 g/dL (ref 13.0–17.0)
MCH: 26.8 pg (ref 26.0–34.0)
MCHC: 31.5 g/dL (ref 30.0–36.0)
MCV: 84.9 fL (ref 80.0–100.0)
Platelets: 380 10*3/uL (ref 150–400)
RBC: 5.64 MIL/uL (ref 4.22–5.81)
RDW: 17.6 % — ABNORMAL HIGH (ref 11.5–15.5)
WBC: 12.1 10*3/uL — ABNORMAL HIGH (ref 4.0–10.5)
nRBC: 0 % (ref 0.0–0.2)

## 2020-01-12 LAB — GLUCOSE, CAPILLARY
Glucose-Capillary: 125 mg/dL — ABNORMAL HIGH (ref 70–99)
Glucose-Capillary: 110 mg/dL — ABNORMAL HIGH (ref 70–99)
Glucose-Capillary: 266 mg/dL — ABNORMAL HIGH (ref 70–99)
Glucose-Capillary: 192 mg/dL — ABNORMAL HIGH (ref 70–99)

## 2020-01-12 LAB — BASIC METABOLIC PANEL
Anion gap: 15 (ref 5–15)
BUN: 46 mg/dL — ABNORMAL HIGH (ref 6–20)
CO2: 27 mmol/L (ref 22–32)
Calcium: 8.2 mg/dL — ABNORMAL LOW (ref 8.9–10.3)
Chloride: 91 mmol/L — ABNORMAL LOW (ref 98–111)
Creatinine, Ser: 9.09 mg/dL — ABNORMAL HIGH (ref 0.61–1.24)
GFR calc Af Amer: 7 mL/min — ABNORMAL LOW (ref >60)
GFR calc non Af Amer: 6 mL/min — ABNORMAL LOW (ref >60)
Glucose, Bld: 145 mg/dL — ABNORMAL HIGH (ref 70–99)
Potassium: 5.2 mmol/L — ABNORMAL HIGH (ref 3.5–5.1)
Sodium: 133 mmol/L — ABNORMAL LOW (ref 135–145)

## 2020-01-12 LAB — PHOSPHORUS: Phosphorus: 6.3 mg/dL — ABNORMAL HIGH (ref 2.5–4.6)

## 2020-01-12 MED ORDER — SODIUM CHLORIDE 0.9 % IV SOLN: 100.0000 mL | INTRAVENOUS | Status: DC | PRN

## 2020-01-12 MED ORDER — HYDROMORPHONE HCL 2 MG PO TABS
ORAL_TABLET | ORAL | Status: AC
  Filled 2020-01-12: qty 1

## 2020-01-12 MED ORDER — FERRIC CITRATE 1 GM 210 MG(FE) PO TABS
630.0000 mg | ORAL_TABLET | Freq: Three times a day (TID) | ORAL | Status: DC
  Administered 2020-01-12 – 2020-01-15 (×10): 630 mg via ORAL
  Filled 2020-01-12 (×11): qty 3

## 2020-01-12 MED ORDER — DICLOFENAC SODIUM 1 % EX GEL
2.0000 g | Freq: Four times a day (QID) | CUTANEOUS | Status: DC
  Administered 2020-01-12 – 2020-01-15 (×11): 2 g via TOPICAL
  Filled 2020-01-12: qty 100

## 2020-01-12 MED ORDER — DOXERCALCIFEROL 4 MCG/2ML IV SOLN
INTRAVENOUS | Status: AC
  Filled 2020-01-12: qty 2

## 2020-01-12 MED ORDER — PENTAFLUOROPROP-TETRAFLUOROETH EX AERO: 1.0000 "application " | INHALATION_SPRAY | CUTANEOUS | Status: DC | PRN

## 2020-01-12 MED ORDER — ALTEPLASE 2 MG IJ SOLR: 2.0000 mg | Freq: Once | INTRAMUSCULAR | Status: DC | PRN

## 2020-01-12 MED ORDER — HEPARIN SODIUM (PORCINE) 1000 UNIT/ML DIALYSIS: 5000.0000 [IU] | INTRAMUSCULAR | Status: DC | PRN

## 2020-01-12 MED ORDER — HEPARIN SODIUM (PORCINE) 1000 UNIT/ML IJ SOLN
INTRAMUSCULAR | Status: AC
  Administered 2020-01-12: 5000 [IU]
  Filled 2020-01-12: qty 5

## 2020-01-12 MED ORDER — LIDOCAINE HCL (PF) 1 % IJ SOLN: 5.0000 mL | INTRAMUSCULAR | Status: DC | PRN

## 2020-01-12 MED ORDER — POLYETHYLENE GLYCOL 3350 17 G PO PACK
17.0000 g | PACK | Freq: Every day | ORAL | Status: DC
  Administered 2020-01-12 – 2020-01-15 (×3): 17 g via ORAL
  Filled 2020-01-12 (×4): qty 1

## 2020-01-12 MED ORDER — ONDANSETRON HCL 4 MG/2ML IJ SOLN
INTRAMUSCULAR | Status: AC
  Filled 2020-01-12: qty 2

## 2020-01-12 MED ORDER — HEPARIN SODIUM (PORCINE) 1000 UNIT/ML IJ SOLN
INTRAMUSCULAR | Status: AC
  Administered 2020-01-12: 2000 [IU] via INTRAVENOUS_CENTRAL
  Filled 2020-01-12: qty 2

## 2020-01-12 MED ORDER — MIDODRINE HCL 5 MG PO TABS
ORAL_TABLET | ORAL | Status: AC
  Administered 2020-01-12: 10 mg via ORAL
  Filled 2020-01-12: qty 2

## 2020-01-12 MED ORDER — LIDOCAINE-PRILOCAINE 2.5-2.5 % EX CREA: 1.0000 "application " | TOPICAL_CREAM | CUTANEOUS | Status: DC | PRN

## 2020-01-12 MED ORDER — HEPARIN SODIUM (PORCINE) 1000 UNIT/ML DIALYSIS: 1000.0000 [IU] | INTRAMUSCULAR | Status: DC | PRN

## 2020-01-12 MED ORDER — CHLORHEXIDINE GLUCONATE CLOTH 2 % EX PADS
6.0000 | MEDICATED_PAD | Freq: Every day | CUTANEOUS | Status: DC
  Administered 2020-01-12 – 2020-01-15 (×4): 6 via TOPICAL

## 2020-01-12 NOTE — Progress Notes (Addendum)
Subjective:   [name] was seen laying in his bed this morning. Translator was used to assist in communication. He states that his left shoulder pain has improved with pain medication, but still continues to be present. He states that he is constipated and his last bm was 3 days ago. He has difficulty climbing stairs and states that he therefore lives on the first floor. He gets short of breath after walking 563m. He has lower back pain since 2019.   Objective:  Vital signs in last 24 hours: Vitals:   01/11/20 1903 01/11/20 1930 01/11/20 2346 01/12/20 0403  BP:  139/86 (!) 153/99 111/80  Pulse:  (!) 114 (!) 106 90  Resp:  19 17 17   Temp:  98.1 F (36.7 C) 98.7 F (37.1 C) 98.3 F (36.8 C)  TempSrc:  Oral Oral Oral  SpO2:  97% 92% 92%  Weight: 79 kg     Height: 5\' 4"  (1.626 m)      Physical Exam  Constitutional: He is oriented to person, place, and time. He appears well-developed and well-nourished. No distress.  HENT:  Head: Normocephalic and atraumatic.  Eyes: Conjunctivae are normal.  Cardiovascular: Normal rate and normal heart sounds.  Respiratory: Effort normal and breath sounds normal. No respiratory distress. He has no wheezes.  GI: Soft. Bowel sounds are normal. He exhibits no distension. There is no abdominal tenderness.  Musculoskeletal:        General: No edema.  Neurological: He is alert and oriented to person, place, and time.  3/5 strength in right upper extremity, 4/5 right lower extremity, sensation intact in all extremities   Skin: He is not diaphoretic. No erythema.  Psychiatric: He has a normal mood and affect. His behavior is normal. Judgment and thought content normal.   Assessment/Plan:  Principal Problem:   Right arm pain Active Problems:   DM (diabetes mellitus), type 2 with renal complications (HCC)   ESRD (end stage renal disease) (HCC)   Cervical radiculopathy  Mr Betschart is a 59 y.o male with esrd, htn, cad, dm2 presented with focal  right arm pain  Right arm pain  Probable cervical radiculopathy Present from forearm to neck, severe intensity, feels like pins and needles, and progressively worsening.   AVF site is without pain or surrounding erythema or signs of infections. CT cervical spine showing degenerative disc disease at c6-c7. POCUS of right shoulder does not show bony abnormalities or effusion.    MRI brain with remote small vessel infarcts and cervical spine showing mild spinal stenosis c5-c6, spondylosis c6-c7 with mild spinal stenosis and mild foraminal stenosis bilaterally. Right shoulder xray without osseous abnormality.   Pain is likely secondary to cervical radiculopathy with a component of muscle spasms and deconditioning. Patient cannot take nsaids due to renal insufficiency.  -Physical therapy -pain control: dilaudid 2mg  q4hrs, gabapentin 300 mg qd, duloxetine 30mg  qd, voltaren gel  ESRD  Gets HD TTS via right forearm av fistula. Only able to tolerate 1.5hrs of hd 2/5 due to muscle cramps in abdomen and right arm. Last dialysis 2/5 for 4 hrs.   Expressed to Dr. Marianna Payment that he does not wish to continue hd.   -palliative care  -midodrine 10mg  qd prn  -midodrine 10mg  qd  -continue cinacalcet 120mg  qd  -continue doxercalciferol  Diabetes Mellitus Poorly controlled, a1c is 12 (likely higher due to pt having esrd)  -Lantus 10u qd  -SSI  Anemia of chronic renal disease   -will monitor   Dispo:  Anticipated discharge in approximately 1-2 day(s).   Lars Mage, MD 01/12/2020, 7:21 AM

## 2020-01-12 NOTE — Progress Notes (Signed)
OT Cancellation Note  Patient Details Name: Fernando Jones MRN: [id number] DOB: [birth year]   Cancelled Treatment:    Reason Eval/Treat Not Completed: Patient at procedure or test/ unavailable.  Will reattempt  Nilsa Nutting., OTR/L Acute Rehabilitation Services Pager [phone] Office [phone]   Lucille Passy M 01/12/2020, 1:38 PM

## 2020-01-12 NOTE — Progress Notes (Signed)
PT Cancellation Note  Patient Details Name: Fernando Jones MRN: [id number] DOB: 07-01-1961   Cancelled Treatment:    Reason Eval/Treat Not Completed: Patient at procedure or test/unavailable. Pt in HD. PT to re-attempt as time allows.   Lorriane Shire 01/12/2020, 2:53 PM  Lorrin Goodell, PT  Office # [phone] Pager [phone]

## 2020-01-12 NOTE — Consult Note (Signed)
[facility] Renal Consultation Note    Indication for Consultation:  Management of ESRD/hemodialysis; anemia, hypertension/volume and secondary hyperparathyroidism  [id number], No Pcp Per  HPI: Fernando Jones is a 59 y.o. male. ESRD on HD TTS at [facility], first starting in 12/2017.  Past medical history significant for DM, HTN, HLD, depression, ED and nephrolithiasis requiring lithotripsy.  Of note, patient is compliant with prescribed dialysis regimen. Review of outpatient records shows patient has been having cramping and dialysis but did not want to have dry weight increased.   Patient was sent to the ED from dialysis 2 days ago due to pain in his R arm.  Reports pain starting in forearm of RUE around 3AM Thrusday morning and it woke him from sleep.  The pain began to travel up his arm to his shoulder throughout the morning and worsened after starting dialysis.  After 1 hour of treatment the pain became unbearable and he signed off to come to the ED.  Yesterday during dialysis he signed off after 1.5hrs due to abdominal cramping and pain in RUE. VVS consulted on patient and do not think the pain is related to the dialysis access.  He has never had pain like this before.  MRI showing degenerative disc disease with spinal and foraminal stenosis at C6-7 likely resulting in radicular symptoms.  Today, reports ongoing pain, primary team added meds and he is hoping for improvement.  Reports weakness in R arm associated with pain.  Admits to abdominal cramping with dialysis lately and constipation.  Last BM 3 days ago. Vomited during dialysis yesterday.  Likely has gained weight and needs dry weight adjusted.  Denies SOB, CP, n/v/d, dizziness and fatigue.  Patient has been admitted for further evaluation and management.    Past Medical History:  Diagnosis Date  . Anemia   . Erectile dysfunction 2012  . ESRD (end stage renal disease) ([place])    w/Left ureteral  stone/hydronephrosis/notes 12/06/2017  . Hepatitis 1973   "? kind"  . Hyperlipidemia 2012  . Hypertension   . Leukocytosis   . Mild CAD    a. by cath 10/2018.  Marland Kitchen Pericardial effusion    a. small by echo 10/2018.  . Pulmonary nodule    a. by CT 10/2018.  . Tobacco abuse 2012   1/2 pack per day  . Type II diabetes mellitus ([place])    Past Surgical History:  Procedure Laterality Date  . APPENDECTOMY    . AV FISTULA PLACEMENT Right 12/09/2017   Procedure: ARTERIOVENOUS (AV) FISTULA CREATION;  Surgeon: Rosetta Posner, MD;  Location: [place];  Service: Vascular;  Laterality: Right;  . CARDIAC CATHETERIZATION  05/12/2009   Archie Endo 04/07/2011  . CYSTOSCOPY/URETEROSCOPY/HOLMIUM LASER/STENT PLACEMENT Left 12/08/2017   Procedure: CYSTOSCOPY/RETROGRADE PYLEOGRAM LEFT URETEROSCOPY AND STONE EXTRACTION;  Surgeon: Festus Aloe, MD;  Location: [facility];  Service: Urology;  Laterality: Left;  . HOLMIUM LASER APPLICATION Left [id number]   Procedure: HOLMIUM LASER APPLICATION;  Surgeon: Festus Aloe, MD;  Location: [facility];  Service: Urology;  Laterality: Left;  . INSERTION OF DIALYSIS CATHETER Right 12/09/2017   Procedure: EXCHANGE  OF TUNNELED  DIALYSIS CATHETER RIGHT INTERNAL JUGULAR.;  Surgeon: Rosetta Posner, MD;  Location: [place];  Service: Vascular;  Laterality: Right;  . IR AV DIALY SHUNT INTRO NEEDLE/INTRACATH INITIAL W/PTA/IMG RIGHT Right 05/05/2018  . IR AV DIALY SHUNT INTRO NEEDLE/INTRACATH INITIAL W/PTA/IMG RIGHT Right 06/29/2019  . IR DIALY SHUNT INTRO NEEDLE/INTRACATH INITIAL W/IMG RIGHT Right 08/06/2019  .  IR FLUORO GUIDE CV LINE RIGHT  12/07/2017  . IR REMOVAL TUN CV CATH W/O FL  06/05/2018  . IR US GUIDE VASC ACCESS RIGHT  12/07/2017  . IR US GUIDE VASC ACCESS RIGHT  05/05/2018  . IR US GUIDE VASC ACCESS RIGHT  06/29/2019  . IR US GUIDE VASC ACCESS RIGHT  08/06/2019  . LEFT HEART CATH AND CORONARY ANGIOGRAPHY N/A 10/17/2018   Procedure: LEFT HEART CATH AND CORONARY ANGIOGRAPHY;  Surgeon: Leonie Man, MD;  Location: [place] [facility];  Service: Cardiovascular;  Laterality: N/A;  . THROMBECTOMY W/ EMBOLECTOMY Right 06/30/2018   Procedure: THROMBECTOMY and revision ARTERIOVENOUS FISTULA right RADIOCEPHALIC;  Surgeon: Angelia Mould, MD;  Location: [place];  Service: Vascular;  Laterality: Right;  . ULTRASOUND GUIDANCE FOR VASCULAR ACCESS  10/17/2018   Procedure: Ultrasound Guidance For Vascular Access;  Surgeon: Leonie Man, MD;  Location: [place] [facility];  Service: Cardiovascular;;   Family History  Problem Relation Age of Onset  . Congestive Heart Failure Mother   . Diabetes Neg Hx    Social History:  reports that he quit smoking about 3 years ago. His smoking use included cigarettes. He smoked 1.00 pack per day. He has never used smokeless tobacco. He reports that he does not drink alcohol or use drugs. No Known Allergies Prior to Admission medications   Medication Sig Start Date End Date Taking? Authorizing Provider  aspirin EC 81 MG EC tablet Take 1 tablet (81 mg total) by mouth daily. 12/17/17  Yes Dana Allan I, MD  cinacalcet (SENSIPAR) 60 MG tablet Take 120 mg by mouth daily.   Yes [provider]  ferric citrate (AURYXIA) 1 GM 210 MG(Fe) tablet Take 1 tablet (210 mg total) by mouth 3 (three) times daily with meals. Patient taking differently: Take 630 mg by mouth 3 (three) times daily with meals.  07/02/18  Yes Irene Pap N, DO  midodrine (PROAMATINE) 10 MG tablet Take 10 mg by mouth daily.  12/17/19  Yes [provider]  rOPINIRole (REQUIP) 0.25 MG tablet Take 0.25-0.5 mg by mouth at bedtime. 09/29/18  Yes [provider]  atorvastatin (LIPITOR) 40 MG tablet Take 1 tablet (40 mg total) by mouth at bedtime. Patient not taking: Reported on 01/10/2020 01/16/19   Almyra Deforest, PA  dicyclomine (BENTYL) 20 MG tablet Take 1 tablet (20 mg total) by mouth 3 (three) times daily before meals. Patient not taking: Reported on 01/10/2020 10/25/18   Rai,  Vernelle Emerald, MD  ondansetron (ZOFRAN) 4 MG tablet Take 1 tablet (4 mg total) by mouth every 6 (six) hours as needed for nausea. Patient not taking: Reported on 01/10/2020 10/25/18   Rai, Vernelle Emerald, MD  pantoprazole (PROTONIX) 40 MG tablet Take 1 tablet (40 mg total) by mouth 2 (two) times daily for 14 days. Patient not taking: Reported on 01/10/2020 12/20/18 01/03/19  Almyra Deforest, PA  tamsulosin (FLOMAX) 0.4 MG CAPS capsule Take 1 capsule (0.4 mg total) by mouth daily. Patient not taking: Reported on 01/10/2020 12/16/17   Bonnell Public, MD   Current Facility-Administered Medications  Medication Dose Route Frequency Provider Last Rate Last Admin  . aspirin EC tablet 81 mg  81 mg Oral Daily Marianna Payment, MD   81 mg at 01/12/20 [id number]  . Chlorhexidine Gluconate Cloth 2 % PADS 6 each  6 each Topical Q0600 Sonam Wandel, [place], PA   6 each at 01/12/20 0800  . cinacalcet (SENSIPAR) tablet 120 mg  120 mg  Oral Daily Marianna Payment, MD   120 mg at 01/12/20 0940  . diclofenac Sodium (VOLTAREN) 1 % topical gel 2 g  2 g Topical QID Jeanmarie Hubert, MD   2 g at 01/12/20 1000  . diphenhydrAMINE (BENADRYL) capsule 25 mg  25 mg Oral Q6H PRN Ladona Horns, MD   25 mg at 01/12/20 1120  . doxercalciferol (HECTOROL) injection 1 mcg  1 mcg Intravenous Once in dialysis Mandee Pluta, [place], [place]   Stopped at 01/11/20 1450  . DULoxetine (CYMBALTA) DR capsule 30 mg  30 mg Oral Daily Marianna Payment, MD   30 mg at 01/12/20 0939  . gabapentin (NEURONTIN) tablet 300 mg  300 mg Oral Daily Helberg, Larkin Ina, MD   300 mg at 01/12/20 0938  . heparin injection 5,000 Units  5,000 Units Subcutaneous Alona Bene, MD   5,000 Units at 01/12/20 [phone]  . HYDROmorphone (DILAUDID) tablet 2 mg  2 mg Oral Q4H PRN Marianna Payment, MD   2 mg at 01/12/20 0935  . insulin aspart (novoLOG) injection 0-6 Units  0-6 Units Subcutaneous TID WC Ina Homes, MD   1 Units at 01/11/20 1754  . insulin glargine (LANTUS) injection 12 Units  12 Units Subcutaneous  Daily Marianna Payment, MD   12 Units at 01/12/20 [phone]  . midodrine (PROAMATINE) tablet 10 mg  10 mg Oral Daily Marianna Payment, MD   10 mg at 01/12/20 [id number]  . midodrine (PROAMATINE) tablet 10 mg  10 mg Oral Once PRN Tanny Harnack, Ria Comment, PA      . mupirocin ointment (BACTROBAN) 2 % 1 application  1 application Nasal BID Axel Filler, MD   1 application at [id number] [phone]  . nitroGLYCERIN (NITROSTAT) SL tablet 0.4 mg  0.4 mg Sublingual Q5 min PRN Pattricia Boss, MD   0.4 mg at 01/10/20 1520  . ondansetron (ZOFRAN) injection 4 mg  4 mg Intravenous Q6H PRN Marianna Payment, MD   4 mg at 01/12/20 0934  . polyethylene glycol (MIRALAX / GLYCOLAX) packet 17 g  17 g Oral Daily Jeanmarie Hubert, MD   17 g at 01/12/20 1000  . rOPINIRole (REQUIP) tablet 0.25-0.5 mg  0.25-0.5 mg Oral QHS Marianna Payment, MD   0.5 mg at 01/11/20 2150  . senna-docusate (Senokot-S) tablet 1 tablet  1 tablet Oral QHS PRN Marianna Payment, MD       Labs: Basic Metabolic Panel: Recent Labs  Lab 01/10/20 1736 01/11/20 0419 01/12/20 0607  NA 130* 133* 133*  K 4.9 5.4* 5.2*  CL 90* 88* 91*  CO2 23 26 27   GLUCOSE 191* 255* 145*  BUN 57* 74* 46*  CREATININE 9.62* 10.98* 9.09*  CALCIUM 8.9 9.6 8.2*   Liver Function Tests: Recent Labs  Lab 01/10/20 0850  AST 33  ALT 24  ALKPHOS 87  BILITOT 1.2  PROT 7.3  ALBUMIN 3.5   CBC: Recent Labs  Lab 01/10/20 0850 01/11/20 0419 01/12/20 0607  WBC 11.0* 11.9* 12.1*  HGB 15.6 15.7 15.1  HCT 48.6 49.5 47.9  MCV 82.9 83.1 84.9  PLT 399 454* 380   CBG: Recent Labs  Lab 01/11/20 1558 01/11/20 2114 01/12/20 0615 01/12/20 0814 01/12/20 1201  GLUCAP 193* 282* 125* 110* 266*   Studies/Results: DG Shoulder Right  Result Date: 01/11/2020 CLINICAL DATA:  59 year old male with acute right shoulder and humerus pain, decreased range of motion. EXAM: RIGHT SHOULDER - 2+ VIEW COMPARISON:  Chest radiographs 10/14/2018. FINDINGS: Bone mineralization is within normal limits. No  glenohumeral joint dislocation. Proximal  right humerus, visible right clavicle and scapula appear intact. There is some glenoid degenerative spurring. No acute osseous abnormality identified. Negative visible right chest. IMPRESSION: No acute osseous abnormality identified. Electronically Signed   By: Genevie Ann M.D.   On: 01/11/2020 10:28   MR BRAIN WO CONTRAST  Result Date: 01/11/2020 CLINICAL DATA:  Stroke suspected EXAM: MRI HEAD WITHOUT CONTRAST TECHNIQUE: Multiplanar, multiecho pulse sequences of the brain and surrounding structures were obtained without intravenous contrast. COMPARISON:  Head CT from yesterday FINDINGS: Brain: Remote perforator infarct in the left basal ganglia with some hemosiderin staining. Remote lacunar infarct at the bilateral thalamus. Cerebral volume loss that is prominent for age but generalized and nonspecific. Prominent T1 shortening at the level of the bilateral basal ganglia that may be related to mineralization from end-stage renal disease or liver disease. No acute infarct, hemorrhage, hydrocephalus, or collection. Vascular: Normal flow voids Skull and upper cervical spine: Is normal marrow signal Sinuses/Orbits: Negative IMPRESSION: 1. 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 2. No acute or reversible finding. Electronically Signed   By: Monte Fantasia M.D.   On: 01/11/2020 19:40   MR CERVICAL SPINE WO CONTRAST  Result Date: 01/10/2020 CLINICAL DATA:  Acute onset neck pain.  ESRD. EXAM: MRI CERVICAL SPINE WITHOUT CONTRAST TECHNIQUE: Multiplanar, multisequence MR imaging of the cervical spine was performed. No intravenous contrast was administered. COMPARISON:  CT cervical spine 01/10/2020 FINDINGS: Alignment: Normal Vertebrae: Negative for fracture mass or spinal infection. No bone marrow edema. Cord: Normal signal and morphology. Posterior Fossa, vertebral arteries, paraspinal tissues: Negative Disc levels: C2-3: Negative C3-4: Mild disc degeneration without  stenosis C4-5: Mild disc degeneration.  Negative for stenosis C5-6: Mild foraminal narrowing bilaterally due to uncinate spurring and bilateral facet hypertrophy. Small central disc protrusion and mild spinal stenosis. C6-7: Extensive disc degeneration with disc space narrowing and endplate spurring. Mild foraminal narrowing bilaterally and mild spinal stenosis C7-T1: Negative IMPRESSION: Cervical spine degenerative changes which appear chronic. No evidence of fracture or infection in the cervical spine Mild spinal stenosis and mild foraminal stenosis bilaterally C5-6 Spondylosis C6-7 with mild spinal stenosis and mild foraminal stenosis bilaterally. Electronically Signed   By: Franchot Gallo M.D.   On: 01/10/2020 20:17   CT Angio Chest/Abd/Pel for Dissection W and/or Wo Contrast  Result Date: 01/10/2020 CLINICAL DATA:  Extreme right arm and neck pain, chest pain, evaluate for dissection, dialysis patient EXAM: CT ANGIOGRAPHY CHEST, ABDOMEN AND PELVIS TECHNIQUE: Multidetector CT imaging through the chest, abdomen and pelvis was performed using the standard protocol during bolus administration of intravenous contrast. Multiplanar reconstructed images and MIPs were obtained and reviewed to evaluate the vascular anatomy. CONTRAST:  193mL OMNIPAQUE IOHEXOL 350 MG/ML SOLN COMPARISON:  CT angiogram chest abdomen pelvis, 10/15/2018 FINDINGS: CTA CHEST FINDINGS Cardiovascular: Preferential opacification of the thoracic aorta. Mixed aortic atherosclerosis of the thoracic aorta without evidence of aneurysm, dissection, or other acute aortic pathology. Mild cardiomegaly. Three-vessel coronary artery calcifications. No pericardial effusion. Mediastinum/Nodes: No enlarged mediastinal, hilar, or axillary lymph nodes. Thyroid gland, trachea, and esophagus demonstrate no significant findings. Lungs/Pleura: Bandlike scarring or atelectasis of the right middle lobe and right lung base. A small pulmonary nodule at the right lung  base is stable, faintly calcified and benign (series 9, image 88). No pleural effusion or pneumothorax. Musculoskeletal: No chest wall abnormality. No acute or significant osseous findings. Review of the MIP images confirms the above findings. CTA ABDOMEN AND PELVIS FINDINGS VASCULAR Severe mixed atherosclerosis of the aorta  and branch vessels. No evidence of aortic aneurysm, dissection, or other acute aortic pathology. Standard branching pattern of the abdominal aorta. Review of the MIP images confirms the above findings. NON-VASCULAR Hepatobiliary: No solid liver abnormality is seen. No gallstones, gallbladder wall thickening, or biliary dilatation. Pancreas: Unremarkable. No pancreatic ductal dilatation or surrounding inflammatory changes. Spleen: Normal in size without significant abnormality. Adrenals/Urinary Tract: Adrenal glands are unremarkable. Atrophic kidneys. No hydronephrosis. Thickening of the urinary bladder. Stomach/Bowel: Stomach is within normal limits. Appendix appears normal. No evidence of bowel wall thickening, distention, or inflammatory changes. Sigmoid diverticulosis. Lymphatic: No enlarged abdominal or pelvic lymph nodes. Reproductive: Prostatomegaly. Other: Small bilateral fat containing inguinal hernias. No abdominopelvic ascites. Musculoskeletal: No acute or significant osseous findings. Review of the MIP images confirms the above findings. IMPRESSION: 1. Normal contour and caliber of the thoracic and abdominal aorta. No evidence of aortic aneurysm, dissection or other acute aortic pathology. Severe mixed aortic atherosclerosis. Aortic Atherosclerosis (ICD10-I70.0). 2. Bandlike scarring or atelectasis of the right middle lobe and right lung base. 3. Coronary artery disease. 4. No acute abnormalities within the abdomen or pelvis. 5. Prostatomegaly with thickening of the urinary bladder wall, likely due to chronic outlet obstruction. Electronically Signed   By: Eddie Candle M.D.   On:  01/10/2020 14:17    ROS: All others negative except those listed in HPI.  Physical Exam: Vitals:   01/11/20 2346 01/12/20 0403 01/12/20 0810 01/12/20 1200  BP: (!) 153/99 111/80 140/75 (!) 118/95  Pulse: (!) 106 90 88 89  Resp: 17 17 18 14   Temp: 98.7 F (37.1 C) 98.3 F (36.8 C) 97.7 F (36.5 C) 98.5 F (36.9 C)  TempSrc: Oral Oral Oral Oral  SpO2: 92% 92% 100% 96%  Weight:      Height:         General: WDWN, NAD, well appearing male Head: NCAT sclera not icteric MMM Neck: Supple. No lymphadenopathy Lungs: CTA bilaterally. No wheeze, rales or rhonchi. Breathing is unlabored. Heart: RRR. No murmur, rubs or gallops.  Abdomen: soft, +LUQ/LLQ tenderness, +BS, no guarding, no rebound tenderness M/S:  Decreased strength in RUE Lower extremities:no edema, ischemic changes, or open wounds  Neuro: AAOx3. Moves all extremities spontaneously. Psych:  Responds to questions appropriately with a normal affect. Dialysis Access: RU AVF +b/t  Dialysis Orders:  TTS - [facility] 4hrs  400/800 EDW 77.5kg RU AVF Hectorol 42mcg qHD Hep 5000 unit bolus, 2500 units intermittent  Assessment/Plan: 1.  R arm pain - likely d/t radicular symptoms. MRI +DDD w/ spinal/foraminal stenosis at C6-7.  VVS does not think related to AVF. Primary working on pain control.  Currently on dilaudid, gabapentin, duloxetine and voltaren gel.  2.  ESRD -  On HD TTS.  HD not tolerated yesterday due to pain.  K 5.2. Plan for HD again today per regular schedule. Hopefull he will be able to tolerate.  3.  Hypertension/volume  - BP variable. Currently well controlled. Does not appear volume overloaded.  Plan for UF goal 2-3L as tolerated.  Not getting to dry as OP and cramping w/HD, likely need to ^EDW at d/c. 4.  Anemia of CKD - Hgb 15.1.  No indication for ESA. 5.  Secondary Hyperparathyroidism -  Ca at goal. Will check phos. Continue binders, sensipar and VDRA. 6.  Nutrition - Renal diet w/ fluid  restrictions 7. DMT2 8. Constipation - given miralax  Jen Mow, PA-C [facility] Pager: [phone] 01/12/2020, 12:34 PM

## 2020-01-13 ENCOUNTER — Inpatient Hospital Stay (HOSPITAL_COMMUNITY): Payer: Self-pay

## 2020-01-13 DIAGNOSIS — R1030 Lower abdominal pain, unspecified: Secondary | ICD-10-CM

## 2020-01-13 DIAGNOSIS — R109 Unspecified abdominal pain: Secondary | ICD-10-CM

## 2020-01-13 DIAGNOSIS — Z515 Encounter for palliative care: Secondary | ICD-10-CM

## 2020-01-13 LAB — BASIC METABOLIC PANEL
Anion gap: 16 — ABNORMAL HIGH (ref 5–15)
BUN: 27 mg/dL — ABNORMAL HIGH (ref 6–20)
CO2: 26 mmol/L (ref 22–32)
Calcium: 8.5 mg/dL — ABNORMAL LOW (ref 8.9–10.3)
Chloride: 96 mmol/L — ABNORMAL LOW (ref 98–111)
Creatinine, Ser: 6.58 mg/dL — ABNORMAL HIGH (ref 0.61–1.24)
GFR calc Af Amer: 10 mL/min — ABNORMAL LOW (ref >60)
GFR calc non Af Amer: 8 mL/min — ABNORMAL LOW (ref >60)
Glucose, Bld: 122 mg/dL — ABNORMAL HIGH (ref 70–99)
Potassium: 5.2 mmol/L — ABNORMAL HIGH (ref 3.5–5.1)
Sodium: 138 mmol/L (ref 135–145)

## 2020-01-13 LAB — GLUCOSE, CAPILLARY
Glucose-Capillary: 98 mg/dL (ref 70–99)
Glucose-Capillary: 142 mg/dL — ABNORMAL HIGH (ref 70–99)
Glucose-Capillary: 117 mg/dL — ABNORMAL HIGH (ref 70–99)
Glucose-Capillary: 225 mg/dL — ABNORMAL HIGH (ref 70–99)

## 2020-01-13 MED ORDER — SENNOSIDES-DOCUSATE SODIUM 8.6-50 MG PO TABS
1.0000 | ORAL_TABLET | Freq: Two times a day (BID) | ORAL | Status: DC
  Administered 2020-01-13 – 2020-01-15 (×3): 1 via ORAL
  Filled 2020-01-13 (×4): qty 1

## 2020-01-13 MED ORDER — HYDROMORPHONE HCL 1 MG/ML IJ SOLN
0.5000 mg | Freq: Once | INTRAMUSCULAR | Status: AC
  Administered 2020-01-13: 0.5 mg via INTRAVENOUS
  Filled 2020-01-13: qty 0.5

## 2020-01-13 MED ORDER — CAMPHOR-MENTHOL 0.5-0.5 % EX LOTN
TOPICAL_LOTION | CUTANEOUS | Status: DC | PRN
  Filled 2020-01-13: qty 222

## 2020-01-13 MED ORDER — DICLOFENAC SODIUM 1 % EX GEL
2.0000 g | Freq: Three times a day (TID) | CUTANEOUS | Status: DC | PRN
  Filled 2020-01-13: qty 100

## 2020-01-13 MED ORDER — SODIUM ZIRCONIUM CYCLOSILICATE 10 G PO PACK
10.0000 g | PACK | Freq: Once | ORAL | Status: AC
  Administered 2020-01-13: 10 g via ORAL
  Filled 2020-01-13: qty 1

## 2020-01-13 MED ORDER — FLEET ENEMA 7-19 GM/118ML RE ENEM
1.0000 | ENEMA | Freq: Once | RECTAL | Status: AC
  Administered 2020-01-13: 1 via RECTAL
  Filled 2020-01-13: qty 1

## 2020-01-13 NOTE — Progress Notes (Addendum)
Subjective:  Conversation conducted with spanish interpreter Chanda Busing [phone]  Patient states that he has so many issues with his body and does not feel like this is a good quality of life. Patient states that he wants to stop dialysis because he views this "as an exit". Patient asked if he wants to stop dialysis because he is having issues with it or because he wants to stop permanently. Patient states that he does not want to stop permanently but he has been having issues and wants to stop for now. Patient later stated he is interested in stopping dialysis completely "if the alternatives are better". Patient counseled that it would be very dangerous for him to take a break from dialysis for any amount of time. Patient states that his issue is that they are not consistent with the amount of fluid and he gets very fatigued after dialysis. Patient counseled on varying fluid removal during HD and common side effects. Patient then states that he just wants the pain to be gone. Patient states that in a year he has gained 14 kg.   Patient states that he wants to keep going with dialysis until he sees if there are good results. Patient states that he wants to stay in the hospital to speak with the palliative care doctors. Patient was counseled that the only options for his kidney disease are to 1) continue dialysis 2) get a kidney transplant or 3) be comfort care.  Patient asking about finding a new primary care doctor who speaks spanish. Patient states he has a doctor who he has seen on last few years infrequently but wants to switch.  Patient expresses thanks for the conversation  Objective:  Vital signs in last 24 hours: Vitals:   01/12/20 1746 01/12/20 2043 01/12/20 2304 01/13/20 0335  BP: 109/67 (!) 158/86 (!) 154/85 (!) 159/99  Pulse: (!) 102 89 90 93  Resp: 19 18 18 18   Temp: 98.7 F (37.1 C) 98.4 F (36.9 C) 98 F (36.7 C) 98.3 F (36.8 C)  TempSrc: Oral Oral Oral Oral  SpO2: 95%  95%  96%  Weight: 76 kg     Height:       Physical Exam  Constitutional: He is oriented to person, place, and time. He appears well-developed and well-nourished. No distress.  HENT:  Head: Normocephalic and atraumatic.  Eyes: Conjunctivae are normal.  Cardiovascular: Normal rate, regular rhythm and normal heart sounds.  Audible bruit  Respiratory: Effort normal and breath sounds normal. No respiratory distress. He has no wheezes.  GI: Soft. Bowel sounds are normal. He exhibits no distension. There is no abdominal tenderness.  Musculoskeletal:        General: No edema.     Comments: Lifts right arm to 90 degree  Neurological: He is alert and oriented to person, place, and time.  Skin: He is not diaphoretic.  Psychiatric: His behavior is normal. Judgment and thought content normal. His mood appears anxious. His affect is labile.   Assessment/Plan:  Principal Problem:   Right arm pain Active Problems:   DM (diabetes mellitus), type 2 with renal complications (HCC)   ESRD (end stage renal disease) (HCC)   Cervical radiculopathy  Mr Friedman is a 59 y.o male with esrd, htn, cad, dm2 presented with focal right arm pain  Right arm pain  MRI brain with remote small vessel infarcts and cervical spine showing mild spinal stenosis c5-c6, spondylosis c6-c7 with mild spinal stenosis and mild foraminal stenosis bilaterally. Right shoulder  xray without osseous abnormality.   Thought to be secondary to cervical radiculopathy and deconditioning. AVF patent and without obstruction. Patient needed four doses of dilaudid.   PT and OT recommend moist heat pad trial and encourage activity in room.   -avoid nsaids  -pain control: gabapentin 300 mg qd, duloxetine 30mg  qd, voltaren gel  ESRD HD TTS Access site: right forearm av fistula Patient got HD yesterday 2/6  -midodrine 10mg  qd prn  -midodrine 10mg  qd  -continue cinacalcet 120mg  qd  -continue doxercalciferol  Diabetes Mellitus Poorly  controlled, a1c is 12 (likely higher due to pt having esrd). Patients blood glucose readings have been ranging 90-200s.   -Lantus 12u qd  -SSI  Anemia of chronic renal disease   -will monitor   Dispo: Anticipated discharge in approximately 1 day.   Lars Mage, MD 01/13/2020, 6:21 AM

## 2020-01-13 NOTE — Evaluation (Signed)
Physical Therapy Evaluation Patient Details Name: Keyler Grigsby MRN: [id number] DOB: 03/15/1961 Today's Date: 01/13/2020   History of Present Illness  This 59 y.o. male admitted with severe Rt UE pain which limited his ability to complete OP HD, as well as abdomnal pain with n/v.   MRI of cervical spine showed chronic appearing degenerative changes, and mild spinal stenosis and mild foraminal stenosis bil. C5-6, and spondylosis C6-7.   MRI of brain showed remote small vessel infarcts in teh deep gray nuclei, but no acute or reversible finding. CT of chest/abdomen   showed no acute abnormalities.  Dx:  probable cervical radiculopathy Rt UE.  PMH includes:  ESRD on HD; DM, tobacco abuse, hepatitis,     Clinical Impression  Pt presents with mild limitations to functional mobility, requiring minimal assist to steady for basic transitional movements (bed<>chair, sit<>stand). Primarily limited by intermittently intense pain in RUE>abdomen complicated by poor coping skills and fear-avoidance behaviors.  Attempted manual examination of right shoulder, but pt was excruciatingly tender to light pressure at trapezius region and could not tolerate further examination.  Noted increased muscle tension and elevated shoulder posture, likely due at least in part to pain-spasm-pain cycling.  Expect pt could find relief from soft tissue release techniques, consistent stretching,use of modalities (likely heat initially vs. Cold) and, importantly, strategies to better manage and cope with pain. Recommend moist heat pad trial to determine effectiveness for pain management and encourage activity in room with supervision.  Recommend OPPT at d/c, and consultation for comprehensive pain management (pain clinic?) as there appears to be a strong psychoemotional component.  PT will follow acutely to determine progress and to address any impact on gross mobility.    Follow Up Recommendations Outpatient PT(to address pain  management and shoulder function)    Equipment Recommendations  None recommended by PT    Recommendations for Other Services       Precautions / Restrictions Precautions Precautions: Fall Precaution Comments: Pt reports h/o falls 6 falls in the past 3 years       Mobility  Bed Mobility Overal bed mobility: Needs Assistance Bed Mobility: Sit to Supine Rolling: Supervision(with use of bedrails ) Sidelying to sit: Min assist   Sit to supine: Supervision   General bed mobility comments: up at EOB, returned to supine with HOB elevated to 30+degrees  Transfers Overall transfer level: Needs assistance Equipment used: 1 person hand held assist Transfers: Stand Pivot Transfers Sit to Stand: Supervision Stand pivot transfers: Min assist       General transfer comment: pt requests help to power up, needed 5-10% assist to steady and turn to sit  Ambulation/Gait             General Gait Details: deferred due to extreme pain elicited by light touch to right trapezius region  Stairs            Wheelchair Mobility    Modified Rankin (Stroke Patients Only)       Balance Overall balance assessment: Mild deficits observed, not formally tested;History of Falls(unable to fully assess due to extreme pain)                                           Pertinent Vitals/Pain Pain Assessment: Faces Faces Pain Scale: Hurts whole lot Pain Location: RUE, abdomen Pain Descriptors / Indicators: Moaning;Guarding;Grimacing(extremely tender to palpation over R trapezius area)  Pain Intervention(s): Monitored during session;Limited activity within patient's tolerance;Repositioned;Heat applied    Home Living Family/patient expects to be discharged to:: Private residence Living Arrangements: Non-relatives/Friends Available Help at Discharge: Friend(s) Type of Home: Apartment Home Access: Level entry     Home Layout: One level Home Equipment: Wheelchair -  manual;Grab bars - toilet;Grab bars - tub/shower Additional Comments: pt reports he lives with a roommate who works during the day, and who can assist him minimally     Prior Function Level of Independence: Independent         Comments: Pt reports he is mod I with ADLs.  He is able to ambulate ~400-54ft before fatigue.  He reports he does drive, and and grocery shops using a scooter (friend will sometimes do the grocery shopping).  He reports he drives himself to and from HD (from OT eval)     Hand Dominance   Dominant Hand: Left    Extremity/Trunk Assessment   Upper Extremity Assessment Upper Extremity Assessment: Defer to OT evaluation(noted EXTREME tenderness similar to trigger points R traps) RUE Deficits / Details: shoulder AROM limited to ~70* due to pain.      Lower Extremity Assessment Lower Extremity Assessment: Generalized weakness;Overall [facility] for tasks assessed    Cervical / Trunk Assessment Cervical / Trunk Assessment: Other exceptions Cervical / Trunk Exceptions: ability to bend forward limited by severe abdominal pain   Communication   Communication: Prefers language other than English  Cognition Arousal/Alertness: Awake/alert Behavior During Therapy: [facility] for tasks assessed/performed;Anxious;Agitated Overall Cognitive Status: Within Functional Limits for tasks assessed                                 General Comments: pt's affect begins calmly and with light touch to R shoulder, pt's affect becomes agitated and anxious, and pt is unable to participate in slow deep breathing or other calming interventions(fear avoidance behavior strongly expressed)      General Comments General comments (skin integrity, edema, etc.): As noted, swings in affect including extreme pain leading to short quick breathing, unable to attend to PT instruction, fear-avoidance behavior related to moving arm or shoulder or scapulae    Exercises Other Exercises Other  Exercises: Pt instructed in AAROM shoulder flexion in supine, but unable to fully tolerate due to pain    Assessment/Plan    PT Assessment Patient needs continued PT services  PT Problem List Pain;Decreased mobility;Decreased balance;Decreased activity tolerance;Decreased range of motion;Decreased strength       PT Treatment Interventions Modalities;Patient/family education;Manual techniques;Therapeutic activities;Therapeutic exercise    PT Goals (Current goals can be found in the Care Plan section)  Acute Rehab PT Goals Patient Stated Goal: no pain PT Goal Formulation: With patient Time For Goal Achievement: 01/27/20 Potential to Achieve Goals: Fair    Frequency Min 3X/week   Barriers to discharge Decreased caregiver support lives with roommate, no other family/support    Co-evaluation               AM-PAC PT "6 Clicks" Mobility  Outcome Measure Help needed turning from your back to your side while in a flat bed without using bedrails?: None Help needed moving from lying on your back to sitting on the side of a flat bed without using bedrails?: A Little Help needed moving to and from a bed to a chair (including a wheelchair)?: A Little Help needed standing up from a chair using your arms (e.g.,  wheelchair or bedside chair)?: A Little Help needed to walk in hospital room?: A Little Help needed climbing 3-5 steps with a railing? : A Lot 6 Click Score: 18    End of Session   Activity Tolerance: Patient limited by pain;Treatment limited secondary to agitation Patient left: in bed;with call bell/phone within reach;with nursing/sitter in room Nurse Communication: Mobility status PT Visit Diagnosis: Pain;Muscle weakness (generalized) (M62.81);History of falling (Z91.81) Pain - Right/Left: Right Pain - part of body: Shoulder    Time: [id number] PT Time Calculation (min) (ACUTE ONLY): 27 min   Charges:   PT Evaluation $PT Eval Low Complexity: 1 Low PT  Treatments $Therapeutic Exercise: 8-22 mins        Kearney Hard, PT, DPT, MS Board Certified Geriatric Clinical Specialist   Herbie Drape 01/13/2020, 1:12 PM

## 2020-01-13 NOTE — Evaluation (Signed)
Occupational Therapy Evaluation Patient Details Name: Fernando Jones MRN: [id number] DOB: [birth year] Today's Date: 01/13/2020    History of Present Illness This 59 y.o. male admitted with severe Rt UE pain which limited his ability to complete OP HD, as well as abdomnal pain with n/v.   MRI of cervical spine showed chronic appearing degenerative changes, and mild spinal stenosis and mild foraminal stenosis bil. C5-6, and spondylosis C6-7.   MRI of brain showed remote small vessel infarcts in teh deep gray nuclei, but no acute or reversible finding. CT of chest/abdomen   showed no acute abnormalities.  Dx:  probable cervical radiculopathy Rt UE.  PMH includes:  ESRD on HD; DM, tobacco abuse, hepatitis,    Clinical Impression   Pt admitted with above. He demonstrates the below listed deficits and will benefit from continued OT to maximize safety and independence with BADLs.  Pt presents to OT with generalized weakness, as well as abdominal and Rt UE pain which limit his ability to perform ADLs.  He was provided with AE to reduce need to bend forward(which causes abdominal pain), and was able to use with supervision.  He was instructed in initial HEP for Rt UE.  He reports he lives with a roommate and was mod I with ADLs and IADLs, but fatigues rapidly. Will follow acutely.       Follow Up Recommendations  Outpatient OT    Equipment Recommendations  None recommended by OT    Recommendations for Other Services       Precautions / Restrictions Precautions Precautions: Fall Precaution Comments: Pt reports h/o falls 6 falls in the past 3 years       Mobility Bed Mobility Overal bed mobility: Needs Assistance Bed Mobility: Rolling;Sidelying to Sit;Sit to Supine Rolling: Supervision(with use of bedrails ) Sidelying to sit: Min assist   Sit to supine: Supervision   General bed mobility comments: assist to lift trunk   Transfers Overall transfer level: Needs assistance Equipment  used: None Transfers: Sit to/from Stand Sit to Stand: Supervision              Balance                                           ADL either performed or assessed with clinical judgement   ADL Overall ADL's : Needs assistance/impaired Eating/Feeding: Independent   Grooming: Wash/dry hands;Wash/dry face;Oral care;Brushing hair;Supervision/safety;Standing   Upper Body Bathing: Set up;Sitting   Lower Body Bathing: Supervison/ safety;Sit to/from stand Lower Body Bathing Details (indicate cue type and reason): severe pain with attempts at leaning forward  Upper Body Dressing : Set up;Sitting   Lower Body Dressing: Minimal assistance;Sit to/from stand Lower Body Dressing Details (indicate cue type and reason): limited due to severe abdominal pain when bending forward  Toilet Transfer: Supervision/safety;Ambulation;BSC   Toileting- Clothing Manipulation and Hygiene: Sit to/from stand;Min guard       Functional mobility during ADLs: Min guard General ADL Comments: Pt instructed in use of AE for LB ADLs to reduce pain      Vision         Perception     Praxis      Pertinent Vitals/Pain Pain Assessment: Faces Faces Pain Scale: Hurts whole lot Pain Location: abdomen and Rt UE  Pain Descriptors / Indicators: Grimacing;Guarding;Moaning Pain Intervention(s): Limited activity within patient's tolerance;Monitored during session;Repositioned  Hand Dominance Left   Extremity/Trunk Assessment Upper Extremity Assessment Upper Extremity Assessment: RUE deficits/detail RUE Deficits / Details: shoulder AROM limited to ~70* due to pain.     Lower Extremity Assessment Lower Extremity Assessment: Defer to PT evaluation   Cervical / Trunk Assessment Cervical / Trunk Assessment: Other exceptions Cervical / Trunk Exceptions: ability to bend forward limited by severe abdominal pain    Communication Communication Communication: Prefers language other than  English(attempted use of video interpreter, but pt declined its use )   Cognition Arousal/Alertness: Awake/alert Behavior During Therapy: WFL for tasks assessed/performed Overall Cognitive Status: Within Functional Limits for tasks assessed                                     General Comments       Exercises Exercises: Other exercises Other Exercises Other Exercises: Pt instructed in AAROM shoulder flexion in supine, but unable to fully tolerate due to pain    Shoulder Instructions      Home Living Family/patient expects to be discharged to:: Private residence Living Arrangements: Non-relatives/Friends Available Help at Discharge: Friend(s) Type of Home: Apartment Home Access: Level entry     Home Layout: One level     Bathroom Shower/Tub: Teacher, early years/pre: Handicapped height     Home Equipment: Wheelchair - manual;Grab bars - toilet;Grab bars - tub/shower   Additional Comments: pt reports he lives with a roommate who works during the day, and who can assist him minimally       Prior Functioning/Environment Level of Independence: Independent        Comments: Pt reports he is mod I with ADLs.  He is able to ambulate ~400-559ft before fatigue.  He reports he does drive, and and grocery shops using a scooter (friend will sometimes do the grocery shopping).  He reports he drives himself to and from HD         OT Problem List: Decreased strength;Decreased range of motion;Decreased activity tolerance;Impaired balance (sitting and/or standing);Decreased knowledge of use of DME or AE;Impaired UE functional use;Pain      OT Treatment/Interventions: Self-care/ADL training;DME and/or AE instruction;Therapeutic exercise;Manual therapy;Therapeutic activities;Patient/family education;Balance training    OT Goals(Current goals can be found in the care plan section) Acute Rehab OT Goals Patient Stated Goal: to have less pain  OT Goal Formulation:  With patient Time For Goal Achievement: 01/27/20 Potential to Achieve Goals: Good ADL Goals Pt Will Perform Grooming: with modified independence;standing Pt Will Perform Upper Body Bathing: with modified independence;sitting Pt Will Perform Lower Body Bathing: with modified independence;sit to/from stand Pt Will Perform Upper Body Dressing: with modified independence;sitting Pt Will Perform Lower Body Dressing: with modified independence;sit to/from stand Pt Will Transfer to Toilet: with modified independence;ambulating;regular height toilet;bedside commode;grab bars Pt Will Perform Toileting - Clothing Manipulation and hygiene: with modified independence;sit to/from stand Pt/caregiver will Perform Home Exercise Program: Increased ROM;Right Upper extremity;With Supervision;With written HEP provided  OT Frequency: Min 2X/week   Barriers to D/C: Decreased caregiver support          Co-evaluation              AM-PAC OT "6 Clicks" Daily Activity     Outcome Measure Help from another person eating meals?: None Help from another person taking care of personal grooming?: A Little Help from another person toileting, which includes using toliet, bedpan, or urinal?: A Little Help  from another person bathing (including washing, rinsing, drying)?: A Little Help from another person to put on and taking off regular upper body clothing?: A Little Help from another person to put on and taking off regular lower body clothing?: A Little 6 Click Score: 19   End of Session Nurse Communication: Mobility status  Activity Tolerance: Patient tolerated treatment well Patient left: in bed;with call bell/phone within reach;with bed alarm set  OT Visit Diagnosis: Pain Pain - Right/Left: Right Pain - part of body: Arm;Shoulder                Time: [id number] OT Time Calculation (min): 30 min Charges:  OT General Charges $OT Visit: 1 Visit OT Evaluation $OT Eval Moderate Complexity: 1 Mod OT  Treatments $Self Care/Home Management : 8-22 mins  Nilsa Nutting., OTR/L Acute Rehabilitation Services Pager [phone] Office [place], [place] 01/13/2020, 11:04 AM

## 2020-01-13 NOTE — Progress Notes (Addendum)
Paged by RN regarding patient vomiting and elevated BP.   Patient assessed at bedside. He notes that he has had chronic abdominal pain since 2019; however, for past 3 days this has acutely worsened. He notes that he has had similar abdominal pain in 2019 and was given medications that he is unaware of the name that provided some relief but has not completely eradicated the pain. On chart review, concerns for some IBD at the time for which GI consulted and patient given bentyl and voltaren gel with improvement in symptoms. He has followed up with GI and has had endoscopy and colonoscopy that were unremarkable. Thusfar, he has had multiple episodes of emesis without significant relief. He notes that has not had a bowel movement in 3-4 days despite Miralax and senna regimen.  He also notes that he does not produce much urine at baseline due to ESRD status; however, when urinating he does notice that he is unable to completely empty bladder. CT Abdomen Pelvis from 2019 and on this admission with chronic outlet obstruction due to prostate enlargement. He was previously on Flomax but appears as if he is no longer taking this.  For his hypertension, patient notes that his BP fluctuates significantly from SBP in 170s to 70-80's during dialysis sessions and notes that he is on medication that he takes one prior to dialysis and one after dialysis for his BP; however, does not remember the names of these medications. BP via manual cuff 186/99. Patient denies any headaches, dizziness, chest pain or shortness of breath at this time.  On examination, cardiac exam with normal S1 and S2 w/o murmurs, rubs or gallops; pulmonary exam without any acute findings. Abdomen is nondistended, soft, normoactive bowel sounds in all quadrants, tenderness to palpation noted in LLQ, periumbilical and suprapubic area.   Plan:  - Abdominal x-ray - Bladder scan - Scheduled bowel regimen - Fleet enema - Will hold off on BP medications at  this time given history of labile BP's with ESRD

## 2020-01-13 NOTE — Consult Note (Signed)
Consultation Note Date: 01/13/2020   Patient Name: Fernando Jones  DOB: 1961/02/17  MRN: [id number]  Age / Sex: 59 y.o., male  PCP: Patient, No Pcp Per Referring Physician: Axel Jones, *  Reason for Consultation: Establishing goals of care, Non pain symptom management and Psychosocial/spiritual support  HPI/Patient Profile: 59 y.o. male  with past medical history of ESRD on HD for approximately 3 years, DM, HTN, HLD who was admitted on 01/10/2020 with severe right upper extremity pain that traveled up his right shoulder, neck and head. This pain started at 3 am Thursday morning and on a scale of 1-10 is a 20.  Work up indicates that this pain is likely radicular in nature.  The patient is currently on a regimen of cymbalta, hydromorphone, and gabapentin.  Clinical Assessment and Goals of Care:  I have reviewed medical records including EPIC notes, labs and imaging, received report from the attending team, examined the patient and spoke with him diagnosis prognosis, GOC, EOL wishes, disposition and options.  A translator was used via IPAD in the room.  I introduced Palliative Medicine as specialized medical care for people living with serious illness. It focuses on providing relief from the symptoms and stress of a serious illness.   We discussed a brief life review of the patient.  Fernando Jones (who prefers to be called Fernando Jones) is from [place].  His (ex?) wife and children still live in [place].  He lives here without family but has a couple of friends.  He lives with a room mate Fernando Jones.  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  He started on HD approximately 3 years ago.  He mentions that he used to really enjoy GOING to work.  He does not seem to like being at home all of the time.  He also mentions that despite working hard he only makes enough money to pay for his rent and his medications - there is  nothing left over (to send his family).  I asked if his family knew about his illness.  He replied that they knew he was in the hospital but not that he was on HD or how ill he was - "what would that accomplish?" he asks.  Fernando Jones told me of his symptoms of rash and severe pruritis on his head, back and arms; abdominal pain and cramping that has been present since 2019; of progressive weakness in his legs (he can no longer walk); and of his acute right arm, shoulder, neck and head pain.  We discussed HD.  Fernando Jones does not want to stop HD.  He would like to continue treatment but his symptoms have become so bad that his quality of life is now very poor.  If these symptoms are going to continue he would rather stop HD.  He prefers not to keep going until he is completely worn down.   We talked about raising his dry weight - Fernando Jones tells me that his dry weight has been raised repeatedly and it has not  helped the cramping.  I made it clear to Fernando Jones that if he stops HD he will pass in approximately 2 weeks.  We talked about Hospice House.  I attempted to elicit values and goals of care important to the patient.  It is important to him that he continue to be able to work.  He is now facing becoming so disabled that he can no longer work.  If he can not work he has no reason to be here, and thus no reason to take hemodialysis.  He needs to make money for himself and his family.    Advanced directives, concepts specific to code status, artifical feeding and hydration, and rehospitalization were considered and discussed.  He is a DNR and does not desire aggressive care should he arrest.  Hospice and Palliative Care services outpatient were explained and offered.  He was very interested in hearing about hospice house.  If his quality of life an not be improved allowing him to continue working - he would like to opt for [facility].  Questions and concerns were addressed.  The family was encouraged to  call with questions or concerns.    Primary Decision Maker:  PATIENT.  He has no medical surrogate decision maker.    SUMMARY OF RECOMMENDATIONS    I feel [name] is a responsible and proud gentleman who feels his purpose is to work and make an income.  His quality of life is becoming so poor he is unable to work.  If he continues to decline he has no family in the [place] to care for him and he would opt for [facility].  1.  Right arm pain - consider neurosurgery consult with Dr. Clydell Jones - he may have some great practical suggestions for pain or consider injections or a nerve block of some type.  Also radiculopathy responds well to methadone - which may make sense to use in this ESRD patient if it can continue to be prescribed outpatient. 2.  Itching/rash on head, back and arms.  Agree with benedryl.  Will add Sarna lotion.  Consider Doxepin for itch and depression.  (I see cymbalta has been started for pain and depression - would likely use one or the other) 3.  Weakness - agree with physical therapy.  I wonder if CIR would consider him as he does not have insurance - home health would not be an options.  Code Status/Advance Care Planning:  DNR   Symptom Management:   As above  Additional Recommendations (Limitations, Scope, Preferences):  Full Scope Treatment  Palliative Prophylaxis:   Frequent Pain Assessment  Psycho-social/Spiritual:   Desire for further Chaplaincy support: not discussed.  Prognosis:  Less that two weeks if HD is stopped.    Discharge Planning: To Be Determined      Primary Diagnoses: Present on Admission: . (Resolved) Focal neurological deficit . Right arm pain . ESRD (end stage renal disease) ([place]) . DM (diabetes mellitus), type 2 with renal complications ([place]) . Cervical radiculopathy   I have reviewed the medical record, interviewed the patient and family, and examined the patient. The following aspects are pertinent.  Past  Medical History:  Diagnosis Date  . Anemia   . Erectile dysfunction 2012  . ESRD (end stage renal disease) ([place])    w/Left ureteral stone/hydronephrosis/notes 12/06/2017  . Hepatitis 1973   "? kind"  . Hyperlipidemia 2012  . Hypertension   . Leukocytosis   . Mild CAD    a.  by cath 10/2018.  Marland Kitchen Pericardial effusion    a. small by echo 10/2018.  . Pulmonary nodule    a. by CT 10/2018.  . Tobacco abuse 2012   1/2 pack per day  . Type II diabetes mellitus ([place])    Social History   Socioeconomic History  . Marital status: Divorced    Spouse name: Not on file  . Number of children: Not on file  . Years of education: Not on file  . Highest education level: Not on file  Occupational History  . Not on file  Tobacco Use  . Smoking status: Former Smoker    Packs/day: 1.00    Types: Cigarettes    Quit date: 12/06/2016    Years since quitting: 3.1  . Smokeless tobacco: Never Used  Substance and Sexual Activity  . Alcohol use: No  . Drug use: No  . Sexual activity: Not Currently  Other Topics Concern  . Not on file  Social History Narrative  . Not on file   Social Determinants of Health   Financial Resource Strain:   . Difficulty of Paying Living Expenses: Not on file  Food Insecurity:   . Worried About Charity fundraiser in the Last Year: Not on file  . Ran Out of Food in the Last Year: Not on file  Transportation Needs:   . Lack of Transportation (Medical): Not on file  . Lack of Transportation (Non-Medical): Not on file  Physical Activity:   . Days of Exercise per Week: Not on file  . Minutes of Exercise per Session: Not on file  Stress:   . Feeling of Stress : Not on file  Social Connections:   . Frequency of Communication with Friends and Family: Not on file  . Frequency of Social Gatherings with Friends and Family: Not on file  . Attends Religious Services: Not on file  . Active Member of Clubs or Organizations: Not on file  . Attends Archivist  Meetings: Not on file  . Marital Status: Not on file   Family History  Problem Relation Age of Onset  . Congestive Heart Failure Mother   . Diabetes Neg Hx    Scheduled Meds: . aspirin EC  81 mg Oral Daily  . Chlorhexidine Gluconate Cloth  6 each Topical Q0600  . cinacalcet  120 mg Oral Daily  . diclofenac Sodium  2 g Topical QID  . DULoxetine  30 mg Oral Daily  . ferric citrate  630 mg Oral TID WC  . gabapentin  300 mg Oral Daily  . heparin  5,000 Units Subcutaneous Q8H  . insulin aspart  0-6 Units Subcutaneous TID WC  . insulin glargine  12 Units Subcutaneous Daily  . midodrine  10 mg Oral Daily  . mupirocin ointment  1 application Nasal BID  . polyethylene glycol  17 g Oral Daily  . rOPINIRole  0.25-0.5 mg Oral QHS  . sodium zirconium cyclosilicate  10 g Oral Once   Continuous Infusions: PRN Meds:.diphenhydrAMINE, nitroGLYCERIN, ondansetron (ZOFRAN) IV, senna-docusate No Known Allergies Review of Systems intense itching/rash on head, back and arms.  Right upper extremity pain, abdominal pain and cramping, leg weakness.  Physical Exam  Well developed pleasant male, awake, alert, coherent, reasonable. NAD.  Vital Signs: BP 138/87 (BP Location: Right Arm)   Pulse 93   Temp 98.2 F (36.8 C)   Resp 18   Ht 5\' 4"  (1.626 m)   Wt 76 kg   SpO2 90%  BMI 28.76 kg/m  Pain Scale: 0-10 POSS *See Group Information*: 1-Acceptable,Awake and alert Pain Score: Asleep   SpO2: SpO2: 90 % O2 Device:SpO2: 90 % O2 Flow Rate: .   IO: Intake/output summary:   Intake/Output Summary (Last 24 hours) at 01/13/2020 1152 Last data filed at 01/12/2020 1746 Gross per 24 hour  Intake -  Output 2068 ml  Net -2068 ml    LBM: Last BM Date: 01/09/20 Baseline Weight: Weight: 75.3 kg Most recent weight: Weight: 76 kg     Palliative Assessment/Data: 40%     Time In: 11:00 Time Out: 12:00 Time Total: 60 min. Visit consisted of counseling and education dealing with the complex and  emotionally intense issues surrounding the need for palliative care and symptom management in the setting of serious and potentially life-threatening illness. Greater than 50%  of this time was spent counseling and coordinating care related to the above assessment and plan.  Signed by: Florentina Jenny, PA-C Palliative Medicine Pager: [phone]  Please contact Palliative Medicine Team phone at [phone] for questions and concerns.  For individual provider: See Shea Evans

## 2020-01-13 NOTE — Progress Notes (Addendum)
[facility] Progress Note   Subjective:   Patient seen and examined at bedside.  Tolerated dialysis well yesterday.  Continues to have pain and weakness in this RUE.  Discussed weight gain over the last year (7kg per OP record) and that he likely needs his dry weight increased again since he is having cramping at dialysis.  Denies SOB, CP, n/v/d, abdominal pain and edema.   Objective Vitals:   01/12/20 2043 01/12/20 2304 01/13/20 0335 01/13/20 0755  BP: (!) 158/86 (!) 154/85 (!) 159/99 138/87  Pulse: 89 90 93 93  Resp: 18 18 18 18   Temp: 98.4 F (36.9 C) 98 F (36.7 C) 98.3 F (36.8 C) 98.2 F (36.8 C)  TempSrc: Oral Oral Oral   SpO2:  95% 96% 90%  Weight:      Height:       Physical Exam General:NAD, well appearing male, laying in bed Heart:RRR Lungs:CTAB Abdomen:soft, NTND Extremities:no LE edema Dialysis Access: RU AVF +b   Filed Weights   01/11/20 1903 01/12/20 1330 01/12/20 1746  Weight: 79 kg 78.5 kg 76 kg    Intake/Output Summary (Last 24 hours) at 01/13/2020 0957 Last data filed at 01/12/2020 1746 Gross per 24 hour  Intake --  Output 2068 ml  Net -2068 ml    Additional Objective Labs: Basic Metabolic Panel: Recent Labs  Lab 01/11/20 0419 01/12/20 0607 01/13/20 0659  NA 133* 133* 138  K 5.4* 5.2* 5.2*  CL 88* 91* 96*  CO2 26 27 26   GLUCOSE 255* 145* 122*  BUN 74* 46* 27*  CREATININE 10.98* 9.09* 6.58*  CALCIUM 9.6 8.2* 8.5*  PHOS  --  6.3*  --    Liver Function Tests: Recent Labs  Lab 01/10/20 0850  AST 33  ALT 24  ALKPHOS 87  BILITOT 1.2  PROT 7.3  ALBUMIN 3.5   CBC: Recent Labs  Lab 01/10/20 0850 01/11/20 0419 01/12/20 0607  WBC 11.0* 11.9* 12.1*  HGB 15.6 15.7 15.1  HCT 48.6 49.5 47.9  MCV 82.9 83.1 84.9  PLT 399 454* 380   Blood Culture    Component Value Date/Time   SDES BLOOD LEFT FOREARM 10/18/2018 1100   SPECREQUEST  10/18/2018 1100    BOTTLES DRAWN AEROBIC AND ANAEROBIC Blood Culture adequate volume    CULT  10/18/2018 1100    NO GROWTH 5 DAYS Performed at [facility], [place] [address], [place], [place] [address]    REPTSTATUS 10/23/2018 FINAL 10/18/2018 1100    CBG: Recent Labs  Lab 01/12/20 0615 01/12/20 0814 01/12/20 1201 01/12/20 2106 01/13/20 0625  GLUCAP 125* 110* 266* 192* 98   Studies/Results: DG Shoulder Right  Result Date: 01/11/2020 CLINICAL DATA:  59 year old male with acute right shoulder and humerus pain, decreased range of motion. EXAM: RIGHT SHOULDER - 2+ VIEW COMPARISON:  Chest radiographs 10/14/2018. FINDINGS: Bone mineralization is within normal limits. No glenohumeral joint dislocation. Proximal right humerus, visible right clavicle and scapula appear intact. There is some glenoid degenerative spurring. No acute osseous abnormality identified. Negative visible right chest. IMPRESSION: No acute osseous abnormality identified. Electronically Signed   By: Genevie Ann M.D.   On: 01/11/2020 10:28   MR BRAIN WO CONTRAST  Result Date: 01/11/2020 CLINICAL DATA:  Stroke suspected EXAM: MRI HEAD WITHOUT CONTRAST TECHNIQUE: Multiplanar, multiecho pulse sequences of the brain and surrounding structures were obtained without intravenous contrast. COMPARISON:  Head CT from yesterday FINDINGS: Brain: Remote perforator infarct in the left basal ganglia with some hemosiderin  staining. Remote lacunar infarct at the bilateral thalamus. Cerebral volume loss that is prominent for age but generalized and nonspecific. Prominent T1 shortening at the level of the bilateral basal ganglia that may be related to mineralization from end-stage renal disease or liver disease. No acute infarct, hemorrhage, hydrocephalus, or collection. Vascular: Normal flow voids Skull and upper cervical spine: Is normal marrow signal Sinuses/Orbits: Negative IMPRESSION: 1. 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 2. No acute or reversible finding. Electronically Signed   By: Monte Fantasia M.D.   On:  01/11/2020 19:40    Medications:  . aspirin EC  81 mg Oral Daily  . Chlorhexidine Gluconate Cloth  6 each Topical Q0600  . cinacalcet  120 mg Oral Daily  . diclofenac Sodium  2 g Topical QID  . DULoxetine  30 mg Oral Daily  . ferric citrate  630 mg Oral TID WC  . gabapentin  300 mg Oral Daily  . heparin  5,000 Units Subcutaneous Q8H  . insulin aspart  0-6 Units Subcutaneous TID WC  . insulin glargine  12 Units Subcutaneous Daily  . midodrine  10 mg Oral Daily  . mupirocin ointment  1 application Nasal BID  . polyethylene glycol  17 g Oral Daily  . rOPINIRole  0.25-0.5 mg Oral QHS    Dialysis Orders: TTS - [facility] 4hrs 400/800 EDW 77.5kg RU AVF Hectorol 82mcg qHD Hep 5000 unit bolus, 2500 units intermittent  Assessment/Plan: 1.  R arm pain - likely d/t radicular symptoms. MRI +DDD w/ spinal/foraminal stenosis at C6-7.  VVS does not think related to AVF. Primary working on pain control.  Currently on dilaudid, gabapentin, duloxetine and voltaren gel.  2.  ESRD -  On HD TTS.  HD tolerated yesterday using AVF. K 5.2, will give dose lokelma today.  Next dialysis on 2/9.     3.  Hypertension/volume  - BP variable. Currently well controlled. Does not appear volume overloaded.  Plan for UF goal 2-3L as tolerated.  Not getting to dry as OP and cramping w/HD, likely need to ^EDW at d/c. 4.  Anemia of CKD - last Hgb 15.1.  No indication for ESA. 5.  Secondary Hyperparathyroidism -  Ca at goal. Phos elevated, continue binders, sensipar and VDRA. 6.  Nutrition - Renal diet w/ fluid restrictions 7. DMT2 8. Constipation - given miralax  Jen Mow, PA-C [facility] Pager: [phone] 01/13/2020,9:57 AM  LOS: 2 days

## 2020-01-13 NOTE — Progress Notes (Signed)
Pt vomited after taking Lokelma packet. Gave IV zofran, IV dilaudid. Pt resting comfortably at this time. Will continue to monitor.

## 2020-01-14 DIAGNOSIS — M5412 Radiculopathy, cervical region: Secondary | ICD-10-CM

## 2020-01-14 DIAGNOSIS — K5900 Constipation, unspecified: Secondary | ICD-10-CM

## 2020-01-14 LAB — GLUCOSE, CAPILLARY
Glucose-Capillary: 107 mg/dL — ABNORMAL HIGH (ref 70–99)
Glucose-Capillary: 225 mg/dL — ABNORMAL HIGH (ref 70–99)
Glucose-Capillary: 107 mg/dL — ABNORMAL HIGH (ref 70–99)
Glucose-Capillary: 119 mg/dL — ABNORMAL HIGH (ref 70–99)

## 2020-01-14 LAB — BASIC METABOLIC PANEL
Anion gap: 21 — ABNORMAL HIGH (ref 5–15)
BUN: 54 mg/dL — ABNORMAL HIGH (ref 6–20)
CO2: 23 mmol/L (ref 22–32)
Calcium: 8.4 mg/dL — ABNORMAL LOW (ref 8.9–10.3)
Chloride: 94 mmol/L — ABNORMAL LOW (ref 98–111)
Creatinine, Ser: 9.16 mg/dL — ABNORMAL HIGH (ref 0.61–1.24)
GFR calc Af Amer: 7 mL/min — ABNORMAL LOW (ref >60)
GFR calc non Af Amer: 6 mL/min — ABNORMAL LOW (ref >60)
Glucose, Bld: 122 mg/dL — ABNORMAL HIGH (ref 70–99)
Potassium: 4.7 mmol/L (ref 3.5–5.1)
Sodium: 138 mmol/L (ref 135–145)

## 2020-01-14 MED ORDER — CHLORHEXIDINE GLUCONATE CLOTH 2 % EX PADS
6.0000 | MEDICATED_PAD | Freq: Every day | CUTANEOUS | Status: DC
  Administered 2020-01-14 – 2020-01-15 (×2): 6 via TOPICAL

## 2020-01-14 MED ORDER — METHADONE HCL 5 MG PO TABS
2.5000 mg | ORAL_TABLET | Freq: Three times a day (TID) | ORAL | Status: DC | PRN
  Administered 2020-01-14: 2.5 mg via ORAL
  Filled 2020-01-14: qty 1

## 2020-01-14 MED ORDER — METHADONE HCL 5 MG PO TABS: 2.5000 mg | ORAL_TABLET | Freq: Three times a day (TID) | ORAL | Status: DC

## 2020-01-14 NOTE — Progress Notes (Signed)
Physical Therapy Treatment Patient Details Name: Fernando Jones MRN: [id number] DOB: 01/08/1961 Today's Date: 01/14/2020    History of Present Illness This 59 y.o. male admitted with severe Rt UE pain which limited his ability to complete OP HD, as well as abdomnal pain with n/v.   MRI of cervical spine showed chronic appearing degenerative changes, and mild spinal stenosis and mild foraminal stenosis bil. C5-6, and spondylosis C6-7.   MRI of brain showed remote small vessel infarcts in teh deep gray nuclei, but no acute or reversible finding. CT of chest/abdomen   showed no acute abnormalities.  Dx:  probable cervical radiculopathy Rt UE.  PMH includes:  ESRD on HD; DM, tobacco abuse, hepatitis,     PT Comments    Pt continues with significant pain in R shld and abdomen limiting ambulation. Pt used a RW however unable to push with R UE. Pt at increased falls risk and would require 24/7 assist due to increased falls risk and constant report of dizziness despite BP being stable. RN made aware. Check gait comments for BP readings. Acute PT to cont to follow.    Follow Up Recommendations  Outpatient PT;Supervision/Assistance - 24 hour     Equipment Recommendations  None recommended by PT    Recommendations for Other Services       Precautions / Restrictions Precautions Precautions: Fall Precaution Comments: pt reports "i feel dizzy, even in the bed" Restrictions Weight Bearing Restrictions: No    Mobility  Bed Mobility Overal bed mobility: Needs Assistance Bed Mobility: Supine to Sit     Supine to sit: Min guard     General bed mobility comments: HOB elevated, used bed rail, minimal use of R UE  Transfers Overall transfer level: Needs assistance Equipment used: Rolling walker (2 wheeled) Transfers: Sit to/from Stand Sit to Stand: Min guard         General transfer comment: verbal cues for hand placement several times however continued to pull up on RW with L  UE  Ambulation/Gait Ambulation/Gait assistance: Min guard Gait Distance (Feet): 50 Feet Assistive device: Rolling walker (2 wheeled)(only held on with R UE about 10') Gait Pattern/deviations: Step-through pattern;Decreased stride length;Trunk flexed Gait velocity: slow Gait velocity interpretation: <1.8 ft/sec, indicate of risk for recurrent falls General Gait Details: pt reports dizziness upon standing, BP 108/62, 97/62 and then 105/68. Pt only able to tolerate holding on with R UE x 10', pt with progressive trunk flexion due to progressive worsening of abdominal pain   Stairs             Wheelchair Mobility    Modified Rankin (Stroke Patients Only)       Balance Overall balance assessment: Needs assistance Sitting-balance support: No upper extremity supported;Feet supported Sitting balance-Leahy Scale: Good     Standing balance support: Single extremity supported Standing balance-Leahy Scale: Fair Standing balance comment: pt c/o dizziness, uses RW for amb, reports falls at home                            Cognition Arousal/Alertness: Awake/alert Behavior During Therapy: Flat affect Overall Cognitive Status: Within Functional Limits for tasks assessed                                 General Comments: pt with good command follow      Exercises      General Comments  General comments (skin integrity, edema, etc.): vss      Pertinent Vitals/Pain Pain Assessment: Faces Faces Pain Scale: Hurts whole lot Pain Location: R shld and abdomen around to back Pain Descriptors / Indicators: Moaning;Guarding;Grimacing Pain Intervention(s): Monitored during session    Home Living                      Prior Function            PT Goals (current goals can now be found in the care plan section) Acute Rehab PT Goals Patient Stated Goal: stop the pain Progress towards PT goals: Progressing toward goals    Frequency    Min  3X/week      PT Plan Discharge plan needs to be updated    Co-evaluation              AM-PAC PT "6 Clicks" Mobility   Outcome Measure  Help needed turning from your back to your side while in a flat bed without using bedrails?: None Help needed moving from lying on your back to sitting on the side of a flat bed without using bedrails?: A Little Help needed moving to and from a bed to a chair (including a wheelchair)?: A Little Help needed standing up from a chair using your arms (e.g., wheelchair or bedside chair)?: A Little Help needed to walk in hospital room?: A Little Help needed climbing 3-5 steps with a railing? : A Lot 6 Click Score: 18    End of Session Equipment Utilized During Treatment: Gait belt Activity Tolerance: Patient limited by pain;Treatment limited secondary to agitation Patient left: in chair;with call bell/phone within reach Nurse Communication: Mobility status(c/o dizziness and pain, VSS stable) PT Visit Diagnosis: Pain;Muscle weakness (generalized) (M62.81);History of falling (Z91.81) Pain - Right/Left: Right Pain - part of body: Shoulder     Time: [id number] PT Time Calculation (min) (ACUTE ONLY): 19 min  Charges:  $Gait Training: 8-22 mins                     Kittie Plater, PT, DPT Acute Rehabilitation Services Pager #: [phone] Office #: [phone]    Berline Lopes 01/14/2020, 10:01 AM

## 2020-01-14 NOTE — Progress Notes (Signed)
Palliative follow up.  Beside visit to check in on symptoms and to see if Fernando Jones had any questions.  He was sleeping but woke easily.  He spoke to me only in [place] which I did not understand.  He complained of right arm pain.  No other questions.  He has been started on methadone prn for pain.  I hope this helps.   He needs excellent primary care.  I hope he will follow with the [facility].  I do not believe he can afford his medications.  PMT will follow up with him again tomorrow to see how he tolerates HD.  Florentina Jenny, PA-C Palliative Medicine Office:  [phone]  No charge note.

## 2020-01-14 NOTE — Progress Notes (Signed)
I responded to spiritual care consult for support. Pt was alert and sitting up in his chair. I offered Jermari space of voice concerns. He said that he was not interested in talking to me. He did say however, that he feels ok for right now. I offered him spiritual care if he changes his mind. Chaplain available as needed.   Palliative Care  Resident Chaplain  Fidel Levy  Pager [phone]

## 2020-01-14 NOTE — Progress Notes (Addendum)
Subjective:   Patient states that he had a watery bowel movement overnight. He states his pain has decreased with the use of pain medication.  She was able to ambulate with physical therapy this morning but needed assistance.  He states that his pain is controlled first couple hours while taking the medication but overall his pain is decreased since being admitted.  He states that he was able to tolerate dialysis yesterday but still has pruritus and abdominal cramping.  Objective:  Vital signs in last 24 hours: Vitals:   01/13/20 2138 01/13/20 2207 01/14/20 0033 01/14/20 0347  BP: (!) 90/57 124/67 (!) 169/83 (!) 147/84  Pulse: (!) 109  97 93  Resp: 18  19 18   Temp: 97.7 F (36.5 C)  98.2 F (36.8 C) 98.1 F (36.7 C)  TempSrc: Oral  Oral Oral  SpO2: 96%  93% 96%  Weight:      Height:        Physical Exam: Physical Exam  Constitutional: He is oriented to person, place, and time. No distress.  HENT:  Head: Normocephalic and atraumatic.  Eyes: EOM are normal.  Cardiovascular: Normal rate, regular rhythm, normal heart sounds and intact distal pulses. Exam reveals no gallop and no friction rub.  No murmur heard. Pulmonary/Chest: Effort normal. No respiratory distress. He exhibits no tenderness.  Abdominal: Soft. He exhibits no distension. There is abdominal tenderness.  Musculoskeletal:        General: Tenderness (right arm and shoulder) present. No edema.     Cervical back: Normal range of motion.  Neurological: He is alert and oriented to person, place, and time.  Skin: Skin is warm and dry.    Filed Weights   01/11/20 1903 01/12/20 1330 01/12/20 1746  Weight: 79 kg 78.5 kg 76 kg    No intake or output data in the 24 hours ending 01/14/20 0548  Pertinent labs/Imaging: CBC Latest Ref Rng & Units 01/12/2020 01/11/2020 01/10/2020  WBC 4.0 - 10.5 K/uL 12.1(H) 11.9(H) 11.0(H)  Hemoglobin 13.0 - 17.0 g/dL 15.1 15.7 15.6  Hematocrit 39.0 - 52.0 % 47.9 49.5 48.6  Platelets 150 -  400 K/uL 380 454(H) 399    CMP Latest Ref Rng & Units 01/13/2020 01/12/2020 01/11/2020  Glucose 70 - 99 mg/dL 122(H) 145(H) 255(H)  BUN 6 - 20 mg/dL 27(H) 46(H) 74(H)  Creatinine 0.61 - 1.24 mg/dL 6.58(H) 9.09(H) 10.98(H)  Sodium 135 - 145 mmol/L 138 133(L) 133(L)  Potassium 3.5 - 5.1 mmol/L 5.2(H) 5.2(H) 5.4(H)  Chloride 98 - 111 mmol/L 96(L) 91(L) 88(L)  CO2 22 - 32 mmol/L 26 27 26   Calcium 8.9 - 10.3 mg/dL 8.5(L) 8.2(L) 9.6  Total Protein 6.5 - 8.1 g/dL - - -  Total Bilirubin 0.3 - 1.2 mg/dL - - -  Alkaline Phos 38 - 126 U/L - - -  AST 15 - 41 U/L - - -  ALT 0 - 44 U/L - - -    DG Abd 1 View  Result Date: 01/13/2020 CLINICAL DATA:  Abdominal pain EXAM: ABDOMEN - 1 VIEW COMPARISON:  CT January 10, 2020 FINDINGS: There is a mildly prominent air-filled loop of bowel seen within the mid abdomen, this is thought to likely represent a redundant sigmoid colon as seen on recent CT. No dilated loops of small bowel are seen. Air and stool seen to the level of the rectum. No abnormal radiopaque calculi are noted over the kidneys. There are vascular calcifications seen. IMPRESSION: Nonobstructive bowel gas pattern. Electronically Signed  By: Prudencio Pair M.D.   On: 01/13/2020 20:28    Assessment/Plan:  Principal Problem:   Right arm pain Active Problems:   DM (diabetes mellitus), type 2 with renal complications (HCC)   ESRD (end stage renal disease) ([place])   Cervical radiculopathy   Abdominal pain   Palliative care encounter    Patient Summary: Fernando Jones is a 59 y.o. with pertinent PMH of ESRD, HLD, HTN, CAD, type II DM, admit for right arm pain secondary to cervical radiculopathy complicated by ESRD on hospital day 3  #Right arm pain and weakness: #Cervical Radiculopathy: Patient continues to have right arm pain that he rates as 8 out of 10.  He states that his pain has decreased since being admitted to the hospital and believes that the pain medicine does help.  Patient was  witnessed during physical therapy today.  He is able to ambulate with a walker.  He will need close outpatient follow-up for pain management. - Continue gabapentin  - started on methadone 2.5mg  q8hrs   #ESRD: Patient continues to have side effects from ESRD and dialysis including abdominal cramping and pruritus.  Palliative care was consulted during this hospitalization.  He will need to follow-up with palliative care as an outpatient for further management of his chronic pain and side effects from ESRD. At this time, the patient would like to continue HD, which I think is reasonable. - diphenhydramine for pruritis   #T2DM: Patient will need better outpatient control of his diabetes.  We will continue him on 12 units of Lantus in the outpatient setting.  He will need to follow-up closely with the PCP in order to manage his medications. - SSI sensitive - Lantus 12 units daily   #Constipation: - Bowel regimen Miralax and senokot  - Fleets enema   #Consult transition of care  -Consulted for PCP    Diet: Renal IVF: None VTE: Heparin Code: DNR PT/OT: Outpatient PT; Supervision/Assistance - 24 hours TOC: pending   Dispo: Anticipated discharge today.    Marianna Payment, D.O. [facility], PGY-1 Date 01/14/2020 Time 5:48 AM Pager: [phone]

## 2020-01-14 NOTE — Progress Notes (Signed)
[facility] Progress Note   Subjective:   Patient seen in room w/ interpreter. C/o R shoulder/ arm pain, episodic abd pain (not bothering him right now).  No fevers, chills .   Objective Vitals:   01/14/20 0033 01/14/20 0347 01/14/20 0500 01/14/20 0757  BP: (!) 169/83 (!) 147/84  128/82  Pulse: 97 93  100  Resp: 19 18  15   Temp: 98.2 F (36.8 C) 98.1 F (36.7 C)  98.3 F (36.8 C)  TempSrc: Oral Oral  Oral  SpO2: 93% 96%  96%  Weight:   79.8 kg   Height:       Physical Exam General:NAD, well appearing male, laying in bed Heart:RRR Lungs:CTAB Abdomen:soft, NTND Extremities:no LE edema Dialysis Access: RU AVF +b   Filed Weights   01/12/20 1330 01/12/20 1746 01/14/20 0500  Weight: 78.5 kg 76 kg 79.8 kg   No intake or output data in the 24 hours ending 01/14/20 1103  Additional Objective Labs: Basic Metabolic Panel: Recent Labs  Lab 01/12/20 0607 01/13/20 0659 01/14/20 0601  NA 133* 138 138  K 5.2* 5.2* 4.7  CL 91* 96* 94*  CO2 27 26 23   GLUCOSE 145* 122* 122*  BUN 46* 27* 54*  CREATININE 9.09* 6.58* 9.16*  CALCIUM 8.2* 8.5* 8.4*  PHOS 6.3*  --   --    Liver Function Tests: Recent Labs  Lab 01/10/20 0850  AST 33  ALT 24  ALKPHOS 87  BILITOT 1.2  PROT 7.3  ALBUMIN 3.5   CBC: Recent Labs  Lab 01/10/20 0850 01/11/20 0419 01/12/20 0607  WBC 11.0* 11.9* 12.1*  HGB 15.6 15.7 15.1  HCT 48.6 49.5 47.9  MCV 82.9 83.1 84.9  PLT 399 454* 380   Blood Culture    Component Value Date/Time   SDES BLOOD LEFT FOREARM 10/18/2018 1100   SPECREQUEST  10/18/2018 1100    BOTTLES DRAWN AEROBIC AND ANAEROBIC Blood Culture adequate volume   CULT  10/18/2018 1100    NO GROWTH 5 DAYS Performed at [facility], [place] [address], [place], [place] [address]    REPTSTATUS 10/23/2018 FINAL 10/18/2018 1100    CBG: Recent Labs  Lab 01/13/20 0625 01/13/20 1208 01/13/20 1554 01/13/20 2119 01/14/20 0617  GLUCAP 98 142* 117* 225* 107*    Studies/Results: DG Abd 1 View  Result Date: 01/13/2020 CLINICAL DATA:  Abdominal pain EXAM: ABDOMEN - 1 VIEW COMPARISON:  CT January 10, 2020 FINDINGS: There is a mildly prominent air-filled loop of bowel seen within the mid abdomen, this is thought to likely represent a redundant sigmoid colon as seen on recent CT. No dilated loops of small bowel are seen. Air and stool seen to the level of the rectum. No abnormal radiopaque calculi are noted over the kidneys. There are vascular calcifications seen. IMPRESSION: Nonobstructive bowel gas pattern. Electronically Signed   By: Prudencio Pair M.D.   On: 01/13/2020 20:28    Medications:  . aspirin EC  81 mg Oral Daily  . Chlorhexidine Gluconate Cloth  6 each Topical Q0600  . cinacalcet  120 mg Oral Daily  . diclofenac Sodium  2 g Topical QID  . DULoxetine  30 mg Oral Daily  . ferric citrate  630 mg Oral TID WC  . gabapentin  300 mg Oral Daily  . heparin  5,000 Units Subcutaneous Q8H  . insulin aspart  0-6 Units Subcutaneous TID WC  . insulin glargine  12 Units Subcutaneous Daily  . midodrine  10  mg Oral Daily  . mupirocin ointment  1 application Nasal BID  . polyethylene glycol  17 g Oral Daily  . rOPINIRole  0.25-0.5 mg Oral QHS  . senna-docusate  1 tablet Oral BID    Dialysis: TTS SW    4h   77.5kg   RUA AVF  Hep 5000+ 2596midrun Hectorol 54mcg qHD   Assessment/Plan: 1.  R arm pain - likely d/t radicular symptoms. MRI showed +DDD w/ spinal/foraminal stenosis at C6-7.  VVS does not think related to AVF. Primary team working on pain control.  Currently on dilaudid, gabapentin, duloxetine and voltaren gel.  2.  ESRD -  On HD TTS.  HD tomorrow 3.  Hypertension/volume  - BP variable. Currently well controlled. Does not appear volume overloaded.  Plan for UF goal 2-3L as tolerated.  Not getting to dry as OP and cramping w/HD, likely need to ^EDW at d/c, keeping in mind patient has been reluctant in the past to raise edw.  4.  Anemia of CKD  - last Hgb 15.1.  No indication for ESA. 5.  Secondary Hyperparathyroidism -  Ca at goal. Phos elevated, continue binders, sensipar and VDRA. 6.  Nutrition - Renal diet w/ fluid restrictions 7. DMT2 8. Constipation - given miralax

## 2020-01-15 LAB — RENAL FUNCTION PANEL
Albumin: 3.3 g/dL — ABNORMAL LOW (ref 3.5–5.0)
Anion gap: 21 — ABNORMAL HIGH (ref 5–15)
BUN: 70 mg/dL — ABNORMAL HIGH (ref 6–20)
CO2: 26 mmol/L (ref 22–32)
Calcium: 8 mg/dL — ABNORMAL LOW (ref 8.9–10.3)
Chloride: 86 mmol/L — ABNORMAL LOW (ref 98–111)
Creatinine, Ser: 12.32 mg/dL — ABNORMAL HIGH (ref 0.61–1.24)
GFR calc Af Amer: 5 mL/min — ABNORMAL LOW (ref >60)
GFR calc non Af Amer: 4 mL/min — ABNORMAL LOW (ref >60)
Glucose, Bld: 129 mg/dL — ABNORMAL HIGH (ref 70–99)
Phosphorus: 4.8 mg/dL — ABNORMAL HIGH (ref 2.5–4.6)
Potassium: 4.6 mmol/L (ref 3.5–5.1)
Sodium: 133 mmol/L — ABNORMAL LOW (ref 135–145)

## 2020-01-15 LAB — GLUCOSE, CAPILLARY
Glucose-Capillary: 98 mg/dL (ref 70–99)
Glucose-Capillary: 245 mg/dL — ABNORMAL HIGH (ref 70–99)
Glucose-Capillary: 91 mg/dL (ref 70–99)

## 2020-01-15 LAB — CBC
HCT: 47.8 % (ref 39.0–52.0)
Hemoglobin: 15.3 g/dL (ref 13.0–17.0)
MCH: 27.3 pg (ref 26.0–34.0)
MCHC: 32 g/dL (ref 30.0–36.0)
MCV: 85.2 fL (ref 80.0–100.0)
Platelets: 418 10*3/uL — ABNORMAL HIGH (ref 150–400)
RBC: 5.61 MIL/uL (ref 4.22–5.81)
RDW: 18.7 % — ABNORMAL HIGH (ref 11.5–15.5)
WBC: 13.6 10*3/uL — ABNORMAL HIGH (ref 4.0–10.5)
nRBC: 0.1 % (ref 0.0–0.2)

## 2020-01-15 MED ORDER — METHADONE HCL 10 MG PO TABS: 5.0000 mg | ORAL_TABLET | Freq: Three times a day (TID) | ORAL | 0 refills | Status: AC | PRN

## 2020-01-15 MED ORDER — ONDANSETRON HCL 4 MG PO TABS: 4.0000 mg | ORAL_TABLET | Freq: Every day | ORAL | 0 refills | Status: DC | PRN

## 2020-01-15 MED ORDER — METHADONE HCL 5 MG PO TABS: 2.5000 mg | ORAL_TABLET | Freq: Four times a day (QID) | ORAL | 0 refills | Status: DC | PRN

## 2020-01-15 MED ORDER — METHADONE HCL 5 MG PO TABS: 2.5000 mg | ORAL_TABLET | Freq: Three times a day (TID) | ORAL | Status: DC

## 2020-01-15 MED ORDER — METHADONE HCL 5 MG PO TABS
2.5000 mg | ORAL_TABLET | Freq: Four times a day (QID) | ORAL | Status: DC | PRN
  Administered 2020-01-15 (×2): 2.5 mg via ORAL
  Filled 2020-01-15 (×2): qty 1

## 2020-01-15 MED ORDER — HEPARIN SODIUM (PORCINE) 1000 UNIT/ML DIALYSIS
3000.0000 [IU] | INTRAMUSCULAR | Status: DC | PRN
  Administered 2020-01-15: 3000 [IU] via INTRAVENOUS_CENTRAL

## 2020-01-15 MED ORDER — HEPARIN SODIUM (PORCINE) 1000 UNIT/ML IJ SOLN
INTRAMUSCULAR | Status: AC
  Filled 2020-01-15: qty 3

## 2020-01-15 MED ORDER — BASAGLAR KWIKPEN 100 UNIT/ML ~~LOC~~ SOPN: 12.0000 [IU] | PEN_INJECTOR | Freq: Every day | SUBCUTANEOUS | 0 refills | Status: DC

## 2020-01-15 MED ORDER — ONDANSETRON HCL 4 MG/2ML IJ SOLN: 4.0000 mg | Freq: Four times a day (QID) | INTRAMUSCULAR | 0 refills | Status: DC | PRN

## 2020-01-15 MED ORDER — LORAZEPAM 0.5 MG PO TABS
ORAL_TABLET | ORAL | Status: AC
  Administered 2020-01-15: 0.5 mg via ORAL
  Filled 2020-01-15: qty 1

## 2020-01-15 MED ORDER — METHADONE HCL 5 MG PO TABS: 2.5000 mg | ORAL_TABLET | Freq: Four times a day (QID) | ORAL | Status: DC | PRN

## 2020-01-15 MED ORDER — SENNOSIDES-DOCUSATE SODIUM 8.6-50 MG PO TABS: 1.0000 | ORAL_TABLET | Freq: Two times a day (BID) | ORAL | 0 refills | Status: DC

## 2020-01-15 MED ORDER — DULOXETINE HCL 30 MG PO CPEP: 30.0000 mg | ORAL_CAPSULE | Freq: Every day | ORAL | 0 refills | Status: DC

## 2020-01-15 MED ORDER — GABAPENTIN 600 MG PO TABS: 300.0000 mg | ORAL_TABLET | Freq: Every day | ORAL | 0 refills | Status: DC

## 2020-01-15 MED ORDER — INSULIN GLARGINE 100 UNIT/ML ~~LOC~~ SOLN: 12.0000 [IU] | Freq: Every day | SUBCUTANEOUS | 11 refills | Status: DC

## 2020-01-15 MED ORDER — POLYETHYLENE GLYCOL 3350 17 G PO PACK: 17.0000 g | PACK | Freq: Every day | ORAL | 0 refills | Status: AC

## 2020-01-15 MED ORDER — LORAZEPAM 0.5 MG PO TABS: 0.5000 mg | ORAL_TABLET | Freq: Once | ORAL | Status: AC

## 2020-01-15 MED ORDER — METHADONE HCL 5 MG PO TABS: 5.0000 mg | ORAL_TABLET | Freq: Three times a day (TID) | ORAL | Status: DC

## 2020-01-15 MED ORDER — SENNOSIDES-DOCUSATE SODIUM 8.6-50 MG PO TABS
1.0000 | ORAL_TABLET | Freq: Two times a day (BID) | ORAL | Status: DC
  Administered 2020-01-15: 19:00:00 1 via ORAL
  Filled 2020-01-15: qty 1

## 2020-01-15 MED FILL — METHADONE HCL 10 MG TABLET: 10 | 5 days supply | Qty: 8 | Fill #0

## 2020-01-15 MED FILL — ONDANSETRON HCL 4 MG TABLET: 4 | 3 days supply | Qty: 12 | Fill #0

## 2020-01-15 MED FILL — GABAPENTIN 300 MG CAPSULE: 300 | 30 days supply | Qty: 30 | Fill #0

## 2020-01-15 MED FILL — POLYETHYLENE GLYCOL 3350 PO: 17 | 14 days supply | Qty: 238 | Fill #0

## 2020-01-15 MED FILL — DULoxetine HCL 30 MG CPEP: 30 | 30 days supply | Qty: 30 | Fill #0

## 2020-01-15 MED FILL — BASAGLAR 100 UNIT/ML KWIKPE: 100 | 30 days supply | Qty: 6 | Fill #0

## 2020-01-15 MED FILL — SENEXON-S 8.6-50 MG TABS: 8.6-50 | 15 days supply | Qty: 30 | Fill #0

## 2020-01-15 NOTE — Progress Notes (Signed)
Subjective: HD#4  Patient was seen this morning on rounds.  He states that he is continuing to have pain in his right arm and bilateral hands.  He states that the pain medicine does seem to improve his pain but he still feels like his pain is significant.  He does continue to have some abdominal pain and admits to some constipation.  Objective:  Vital signs in last 24 hours: Vitals:   01/14/20 1618 01/14/20 1934 01/14/20 2328 01/15/20 0438  BP: 135/77 (!) 162/89 (!) 153/90   Pulse: 98 (!) 104 88   Resp: 15 18 18    Temp: 98.3 F (36.8 C) 98.4 F (36.9 C) 98 F (36.7 C)   TempSrc: Oral Oral Oral   SpO2: 93% 97% 95%   Weight:    81.8 kg  Height:        Physical Exam: Physical Exam  Constitutional: He is oriented to person, place, and time and well-developed, well-nourished, and in no distress.  HENT:  Head: Normocephalic and atraumatic.  Eyes: EOM are normal.  Cardiovascular: Normal rate, regular rhythm, normal heart sounds and intact distal pulses. Exam reveals no gallop and no friction rub.  No murmur heard. Pulmonary/Chest: Effort normal. No respiratory distress. He exhibits no tenderness.  Abdominal: Soft. He exhibits no distension. There is abdominal tenderness.  Musculoskeletal:        General: No tenderness or edema.     Cervical back: Normal range of motion.     Comments: Patient has improved her range of motion in his right upper extremity.  Does admit to pain with motion.  Neurological: He is alert and oriented to person, place, and time.  Skin: Skin is warm and dry.    Filed Weights   01/12/20 1746 01/14/20 0500 01/15/20 0438  Weight: 76 kg 79.8 kg 81.8 kg    No intake or output data in the 24 hours ending 01/15/20 0727  Pertinent labs/Imaging: CBC Latest Ref Rng & Units 01/12/2020 01/11/2020 01/10/2020  WBC 4.0 - 10.5 K/uL 12.1(H) 11.9(H) 11.0(H)  Hemoglobin 13.0 - 17.0 g/dL 15.1 15.7 15.6  Hematocrit 39.0 - 52.0 % 47.9 49.5 48.6  Platelets 150 - 400 K/uL  380 454(H) 399    CMP Latest Ref Rng & Units 01/14/2020 01/13/2020 01/12/2020  Glucose 70 - 99 mg/dL 122(H) 122(H) 145(H)  BUN 6 - 20 mg/dL 54(H) 27(H) 46(H)  Creatinine 0.61 - 1.24 mg/dL 9.16(H) 6.58(H) 9.09(H)  Sodium 135 - 145 mmol/L 138 138 133(L)  Potassium 3.5 - 5.1 mmol/L 4.7 5.2(H) 5.2(H)  Chloride 98 - 111 mmol/L 94(L) 96(L) 91(L)  CO2 22 - 32 mmol/L 23 26 27   Calcium 8.9 - 10.3 mg/dL 8.4(L) 8.5(L) 8.2(L)  Total Protein 6.5 - 8.1 g/dL - - -  Total Bilirubin 0.3 - 1.2 mg/dL - - -  Alkaline Phos 38 - 126 U/L - - -  AST 15 - 41 U/L - - -  ALT 0 - 44 U/L - - -    No results found.  Assessment/Plan:  Principal Problem:   Right arm pain Active Problems:   DM (diabetes mellitus), type 2 with renal complications (HCC)   ESRD (end stage renal disease) ([place])   Cervical radiculopathy   Abdominal pain   Palliative care encounter    Patient Summary:  Fernando Jones is a 59 y.o m with esrd, htn, cad, d2 who presented for focal right arm pain.   #Right arm pain: #Cervical radiculopathy: Patient continues to have pain despite taking  methadone 2.5 mg every 6 hours. I increased his dose to 5 mg every 8 hours and discharged him on a 5-day course of this medication.  He will need outpatient follow-up for chronic pain management.  #ESRD  Patient was able to tolerate his previous hemodialysis despite pain.  He is scheduled for hemodialysis today and will be discharged subsequently. - Patient will need to follow up with palliative care in the outpatient setting.  #DM: Patient will be discharged on glargine 12 units daily.  He will need follow-up with a primary care doctor for evaluation management of his diabetes.  Diet: Renal IVF: None VTE: Heparin Code: DNR PT/OT recs: Outpatient PT; Supervision/Assistance - 24 hours TOC recs: Pending PCP placement   Dispo: Anticipated discharge today once patient is set up with PCP.    Marianna Payment, D.O. [facility], PGY-1 Date 01/15/2020 Time 7:27  AM

## 2020-01-15 NOTE — Progress Notes (Signed)
[facility] Progress Note   Subjective:   Patient seen in room w/ interpreter. C/o R shoulder/ arm pain, episodic abd pain (not bothering him right now).  No fevers, chills .   Objective Vitals:   01/14/20 2328 01/15/20 0438 01/15/20 0751 01/15/20 1200  BP: (!) 153/90  (!) 160/84 (!) 174/96  Pulse: 88  92 96  Resp: 18  18 20   Temp: 98 F (36.7 C)  97.7 F (36.5 C) 97.9 F (36.6 C)  TempSrc: Oral  Oral Oral  SpO2: 95%  98% 93%  Weight:  81.8 kg    Height:       Physical Exam General:NAD, well appearing male, laying in bed Heart:RRR Lungs:CTAB Abdomen:soft, NTND Extremities:no LE edema Dialysis Access: RU AVF +b   Filed Weights   01/12/20 1746 01/14/20 0500 01/15/20 0438  Weight: 76 kg 79.8 kg 81.8 kg   No intake or output data in the 24 hours ending 01/15/20 1427  Additional Objective Labs: Basic Metabolic Panel: Recent Labs  Lab 01/12/20 0607 01/12/20 0607 01/13/20 0659 01/14/20 0601 01/15/20 1329  NA 133*   < > 138 138 133*  K 5.2*   < > 5.2* 4.7 4.6  CL 91*   < > 96* 94* 86*  CO2 27   < > 26 23 26   GLUCOSE 145*   < > 122* 122* 129*  BUN 46*   < > 27* 54* 70*  CREATININE 9.09*   < > 6.58* 9.16* 12.32*  CALCIUM 8.2*   < > 8.5* 8.4* 8.0*  PHOS 6.3*  --   --   --  4.8*   < > = values in this interval not displayed.   Liver Function Tests: Recent Labs  Lab 01/10/20 0850 01/15/20 1329  AST 33  --   ALT 24  --   ALKPHOS 87  --   BILITOT 1.2  --   PROT 7.3  --   ALBUMIN 3.5 3.3*   CBC: Recent Labs  Lab 01/10/20 0850 01/10/20 0850 01/11/20 0419 01/12/20 0607 01/15/20 1328  WBC 11.0*   < > 11.9* 12.1* 13.6*  HGB 15.6   < > 15.7 15.1 15.3  HCT 48.6   < > 49.5 47.9 47.8  MCV 82.9  --  83.1 84.9 85.2  PLT 399   < > 454* 380 418*   < > = values in this interval not displayed.   Blood Culture    Component Value Date/Time   SDES BLOOD LEFT FOREARM 10/18/2018 1100   SPECREQUEST  10/18/2018 1100    BOTTLES DRAWN AEROBIC AND  ANAEROBIC Blood Culture adequate volume   CULT  10/18/2018 1100    NO GROWTH 5 DAYS Performed at [facility], [place] [address] [place], [place] [address]    REPTSTATUS 10/23/2018 FINAL 10/18/2018 1100    CBG: Recent Labs  Lab 01/14/20 1218 01/14/20 1615 01/14/20 2148 01/15/20 0555 01/15/20 1201  GLUCAP 225* 107* 119* 98 245*   Studies/Results: DG Abd 1 View  Result Date: 01/13/2020 CLINICAL DATA:  Abdominal pain EXAM: ABDOMEN - 1 VIEW COMPARISON:  CT January 10, 2020 FINDINGS: There is a mildly prominent air-filled loop of bowel seen within the mid abdomen, this is thought to likely represent a redundant sigmoid colon as seen on recent CT. No dilated loops of small bowel are seen. Air and stool seen to the level of the rectum. No abnormal radiopaque calculi are noted over the kidneys. There are vascular calcifications seen.  IMPRESSION: Nonobstructive bowel gas pattern. Electronically Signed   By: Prudencio Pair M.D.   On: 01/13/2020 20:28    Medications:  . aspirin EC  81 mg Oral Daily  . Chlorhexidine Gluconate Cloth  6 each Topical Q0600  . Chlorhexidine Gluconate Cloth  6 each Topical Q0600  . cinacalcet  120 mg Oral Daily  . diclofenac Sodium  2 g Topical QID  . DULoxetine  30 mg Oral Daily  . ferric citrate  630 mg Oral TID WC  . gabapentin  300 mg Oral Daily  . heparin      . heparin  5,000 Units Subcutaneous Q8H  . insulin aspart  0-6 Units Subcutaneous TID WC  . insulin glargine  12 Units Subcutaneous Daily  . midodrine  10 mg Oral Daily  . mupirocin ointment  1 application Nasal BID  . polyethylene glycol  17 g Oral Daily  . rOPINIRole  0.25-0.5 mg Oral QHS  . senna-docusate  1 tablet Oral BID    Dialysis: TTS SW    4h   77.5kg   RUA AVF  Hep 5000+ 2570midrun Hectorol 50mcg qHD   Assessment/Plan: 1.  R arm pain - likely d/t radicular symptoms. MRI showed +DDD w/ spinal/foraminal stenosis at C6-7.  VVS does not think related to AVF. Primary team working  on pain control.  Currently on dilaudid, gabapentin, duloxetine and voltaren gel. Looks more comfortable.  2.  ESRD -  On HD TTS.  HD today.  3.  Hypertension/volume  - BP variable. Currently well controlled. Does not appear volume overloaded.  Plan for UF goal 2-3L as tolerated.  Not getting to dry as OP and cramping w/HD, likely need to ^EDW at d/c, keeping in mind patient has been reluctant in the past to raise edw.  4.  Anemia of CKD - last Hgb 15.1.  No indication for ESA. 5.  Secondary Hyperparathyroidism -  Ca at goal. Phos elevated, continue binders, sensipar and VDRA. 6.  Nutrition - Renal diet w/ fluid restrictions 7. DMT2 8. Constipation - given miralax

## 2020-01-15 NOTE — Progress Notes (Signed)
PT Cancellation Note  Patient Details Name: Fernando Jones MRN: [id number] DOB: 1961/01/20   Cancelled Treatment:    Reason Eval/Treat Not Completed: Patient at procedure or test/unavailable(HD). Will check back as time allows.   Leighton Roach, PT  Acute Rehab Services  Pager [phone] Office [place] 01/15/2020, 2:51 PM

## 2020-01-15 NOTE — TOC Transition Note (Signed)
Transition of Care [facility]) - CM/SW Discharge Note   Patient Details  Name: Fernando Jones MRN: [id number] Date of Birth: [birth year]  [facility] CM/SW Contact:  Pollie Friar, RN Phone Number: 01/15/2020, 4:24 PM   Clinical Narrative:    Pt discharging home with self care. Outpatient therapy entered and will contact the patient for the first appointment.  CM provided MATCH for his medications. Pt to f/u with [facility] pharmacy after d/c for further medication needs. Pt has appt at [facility] for PCP.  Pt states he has transport home after 5 pm. Bedside RN given d/c meds and information.    Final next level of care: OP Rehab Barriers to Discharge: Inadequate or no insurance   Patient Goals and CMS Choice        Discharge Placement                       Discharge Plan and Services                                     Social Determinants of Health (SDOH) Interventions     Readmission Risk Interventions No flowsheet data found.

## 2020-01-17 NOTE — Discharge Summary (Signed)
Name: Fernando Jones MRN: [id number] DOB: 1961/05/28 59 y.o. PCP: Patient, No Pcp Per  Date of Admission: 01/10/2020  8:25 AM Date of Discharge: 01/15/2020 Attending Physician: Lalla Brothers, MD FACP   Discharge Diagnosis: 1. Cervical Radiculopathy   Discharge Medications: Allergies as of 01/15/2020   No Known Allergies     Medication List    STOP taking these medications   dicyclomine 20 MG tablet Commonly known as: BENTYL   pantoprazole 40 MG tablet Commonly known as: PROTONIX   tamsulosin 0.4 MG Caps capsule Commonly known as: FLOMAX     TAKE these medications   aspirin 81 MG EC tablet Take 1 tablet (81 mg total) by mouth daily.   atorvastatin 40 MG tablet Commonly known as: LIPITOR Take 1 tablet (40 mg total) by mouth at bedtime.   Basaglar KwikPen 100 UNIT/ML Sopn Inject 0.12 mLs (12 Units total) into the skin daily.   cinacalcet 60 MG tablet Commonly known as: SENSIPAR Take 120 mg by mouth daily.   DULoxetine 30 MG capsule Commonly known as: CYMBALTA Take 1 capsule (30 mg total) by mouth daily.   ferric citrate 1 GM 210 MG(Fe) tablet Commonly known as: AURYXIA Take 1 tablet (210 mg total) by mouth 3 (three) times daily with meals. What changed: how much to take   gabapentin 600 MG tablet Commonly known as: NEURONTIN Take 0.5 tablets (300 mg total) by mouth daily.   methadone 10 MG tablet Commonly known as: Dolophine Take 0.5 tablets (5 mg total) by mouth every 8 (eight) hours as needed for up to 5 days.   midodrine 10 MG tablet Commonly known as: PROAMATINE Take 10 mg by mouth daily.   ondansetron 4 MG tablet Commonly known as: Zofran Take 1 tablet (4 mg total) by mouth daily as needed for up to 12 doses for nausea or vomiting. What changed:   when to take this  reasons to take this   polyethylene glycol 17 g packet Commonly known as: MIRALAX / GLYCOLAX Take 17 g by mouth daily.   rOPINIRole 0.25 MG tablet Commonly known as:  REQUIP Take 0.25-0.5 mg by mouth at bedtime.   senna-docusate 8.6-50 MG tablet Commonly known as: Senokot-S Take 1 tablet by mouth 2 (two) times daily.       Disposition and follow-up:   Mr.Fernando Jones was discharged from [facility] in Stable condition.  At the hospital follow up visit please address:  1.  Follow up:  A) Nephrology - for ESRD B) PCP - for chronic pain management C) Palliative Care   2.  Labs / imaging needed at time of follow-up: none  3.  Pending labs/ test needing follow-up: none  Follow-up Appointments: Follow-up [place] Follow up.   Why: Use this location for medication assistance. They will fill your prescriptions for a discount.  Contact information: [place] [phone] [place] Follow up on 02/07/2020.   Why: Your appointment is at 9:30. Please arrive early and bring a picture ID and your current medications. Contact information: [place] [phone] [place] Follow up.   Specialty: Rehabilitation Why: The outpatient theray will contact you for the first appointment Contact information: [address] [place] [id number] [place]  Hospital Course by problem list: 1.  Cervical radiculopathy -patient presented to [facility] on 01/10/2019 with severe pain in his right arm and weakness.  The pain started in his shoulder and radiated down to his arm, specifically on the dorsolateral aspect consistent with the C7 dermatome. The pain limited his pROM and aROM.  CT of the head and cervical spine showed no acute fractures.  There was degenerative disc disease at C6-7.  MRI was performed showing mild spinal stenosis at C5-C6,  spondylosis of C6-C7 with mild spinal stenosis and foraminal stenosis bilaterally.  Patient continue to complain of severe pain stating that he did not want to continue to need dialysis as a result of his symptoms.  Diet of care was consulted.  Patient ultimately decided to continue dialysis and opt for chronic pain management.  Patient's pain was adequately controlled during his inpatient stay and he showed functional improvement of his right arm.  He was started on methadone 5 mg daily and discharged with close follow-up with a primary care physician.  He was prescribed MiraLAX and Senokot for bowel regiment to prevent constipation in the setting of opioid pain management.  2. ESRD -patient's admitting complaint initially limited his ability to participate with dialysis.  He was only able to tolerate roughly 1 hour.  After patient's pain was appropriately controlled, he was able to tolerate hemodialysis.  3.  Diabetes mellitus-patient presented with elevated blood glucose levels and A1c of 12 in the setting of ESRD.  He denies any outpatient management of his diabetes.  He was started on 12 units of Lantus daily with significant improvement in his blood glucose levels.  He was discharged on 12 units of glargine with close follow-up with primary care physician.  Discharge Vitals:   BP 107/84 (BP Location: Left Arm)   Pulse (!) 111   Temp 98.1 F (36.7 C) (Oral)   Resp 15   Ht 5\' 4"  (1.626 m)   Wt 75.6 kg   SpO2 97%   BMI 28.61 kg/m   Pertinent Labs, Studies, and Procedures:  CBC Latest Ref Rng & Units 01/15/2020 01/12/2020 01/11/2020  WBC 4.0 - 10.5 K/uL 13.6(H) 12.1(H) 11.9(H)  Hemoglobin 13.0 - 17.0 g/dL 15.3 15.1 15.7  Hematocrit 39.0 - 52.0 % 47.8 47.9 49.5  Platelets 150 - 400 K/uL 418(H) 380 454(H)   CMP Latest Ref Rng & Units 01/15/2020 01/14/2020 01/13/2020  Glucose 70 - 99 mg/dL 129(H) 122(H) 122(H)  BUN 6 - 20 mg/dL 70(H) 54(H) 27(H)  Creatinine 0.61 - 1.24 mg/dL 12.32(H) 9.16(H) 6.58(H)   Sodium 135 - 145 mmol/L 133(L) 138 138  Potassium 3.5 - 5.1 mmol/L 4.6 4.7 5.2(H)  Chloride 98 - 111 mmol/L 86(L) 94(L) 96(L)  CO2 22 - 32 mmol/L 26 23 26   Calcium 8.9 - 10.3 mg/dL 8.0(L) 8.4(L) 8.5(L)  Total Protein 6.5 - 8.1 g/dL - - -  Total Bilirubin 0.3 - 1.2 mg/dL - - -  Alkaline Phos 38 - 126 U/L - - -  AST 15 - 41 U/L - - -  ALT 0 - 44 U/L - - -   CTA:  1. Normal contour and caliber of the thoracic and abdominal aorta. No evidence of aortic aneurysm, dissection or other acute aortic pathology. Severe mixed aortic atherosclerosis. Aortic Atherosclerosis (ICD10-I70.0). 2. Bandlike scarring or atelectasis of the right middle lobe and right lung base. 3. Coronary artery disease. 4. No acute abnormalities within the abdomen or pelvis. 5. Prostatomegaly with thickening of the urinary  bladder wall, likely due to chronic outlet obstruction.  CT head and cervical: 1. No acute fracture or traumatic listhesis. 2. Degenerative disc disease at C6-7. 3. Age advanced atherosclerosis.  Disc levels: Advanced intervertebral disc height loss with degenerative endplate spurring at [id number]. Mild scattered facet arthropathy. Degenerative findings have not significantly progressed compared to the prior study  MR of cervical spine: Cervical spine degenerative changes which appear chronic. No evidence of fracture or infection in the cervical spine Mild spinal stenosis and mild foraminal stenosis bilaterally C5-6 Spondylosis C6-7 with mild spinal stenosis and mild foraminal stenosis bilaterally.   Discharge Instructions: Discharge Instructions    Ambulatory referral to Occupational Therapy   Complete by: As directed    Ambulatory referral to Physical Therapy   Complete by: As directed    Diet - low sodium heart healthy   Complete by: As directed    Diet - low sodium heart healthy   Complete by: As directed    Diet - low sodium heart healthy   Complete by: As directed    Discharge  instructions   Complete by: As directed    You were hospitalized for Cervical Radiculopathy. Thank you for allowing [place] to be part of your care.   Please follow up with: 1. The primary care clinic that you were set up with 2. Palliative care 3. Please continue going to your home dialysis center   Please note these changes made to your medications:  - Start taking Methadone 2.5 mg every 6 hours - Please start taking Miralax and Senna to help with bowel movements - Please Start taking Duloxetine 30 mg daily - Please start taking Gabapentin 300 mg daily. - Please see your after visit summary for medication changes.    Please make sure to follow up with medical providers  Please call our clinic if you have any questions or concerns, we may be able to help and keep you from a long and expensive emergency room wait. Our clinic and after hours phone number is [phone], the best time to call is Monday through Friday 9 am to 4 pm but there is always someone available 24/7 if you have an emergency. If you need medication refills please notify your pharmacy one week in advance and they will send [place] a request.   Discharge instructions   Complete by: As directed    You were hospitalized for Cervical Radiculopathy. Thank you for allowing [place] to be part of your care.   Please follow up with:  1. The primary care clinic that you were set up with  2. Palliative care  3. Please continue going to your home dialysis center    Please note these changes made to your medications:  - Start taking Methadone 5.0 mg every 8 hours for severe pain - Please start taking Miralax and Senna to help with bowel movements  - Please Start taking Duloxetine 30 mg daily  - Please start taking Gabapentin 300 mg daily.  - Please see your after visit summary for medication changes.    Please make sure to follow up with medical providers   Please call our clinic if you have any questions or concerns, we may be able  to help and keep you from a long and expensive emergency room wait. Our clinic and after hours phone number is [phone], the best time to call is Monday through Friday 9 am to 4 pm but there is always someone available 24/7 if you have an emergency.  If you need medication refills please notify your pharmacy one week in advance and they will send [place] a request.   Increase activity slowly   Complete by: As directed    Increase activity slowly   Complete by: As directed    Increase activity slowly   Complete by: As directed       Signed: Marianna Payment, MD 01/17/2020, 6:34 AM   Pager: [phone]

## 2020-02-07 ENCOUNTER — Ambulatory Visit (INDEPENDENT_AMBULATORY_CARE_PROVIDER_SITE_OTHER): Payer: Self-pay | Admitting: Primary Care

## 2020-02-07 ENCOUNTER — Encounter (INDEPENDENT_AMBULATORY_CARE_PROVIDER_SITE_OTHER): Payer: Self-pay | Admitting: Primary Care

## 2020-02-07 ENCOUNTER — Other Ambulatory Visit: Payer: Self-pay

## 2020-02-07 VITALS — BP 105/70 | HR 100 | Temp 97.2°F | Ht 64.0 in | Wt 175.0 lb

## 2020-02-07 DIAGNOSIS — Z992 Dependence on renal dialysis: Secondary | ICD-10-CM

## 2020-02-07 DIAGNOSIS — E1122 Type 2 diabetes mellitus with diabetic chronic kidney disease: Secondary | ICD-10-CM

## 2020-02-07 DIAGNOSIS — E119 Type 2 diabetes mellitus without complications: Secondary | ICD-10-CM

## 2020-02-07 DIAGNOSIS — Z7689 Persons encountering health services in other specified circumstances: Secondary | ICD-10-CM

## 2020-02-07 DIAGNOSIS — R1012 Left upper quadrant pain: Secondary | ICD-10-CM

## 2020-02-07 DIAGNOSIS — N186 End stage renal disease: Secondary | ICD-10-CM

## 2020-02-07 DIAGNOSIS — Z1211 Encounter for screening for malignant neoplasm of colon: Secondary | ICD-10-CM

## 2020-02-07 LAB — GLUCOSE, POCT (MANUAL RESULT ENTRY): POC Glucose: 184 mg/dl — AB (ref 70–99)

## 2020-02-07 MED ORDER — DULOXETINE HCL 30 MG PO CPEP: 30.0000 mg | ORAL_CAPSULE | Freq: Every day | ORAL | 1 refills | Status: AC

## 2020-02-07 MED ORDER — BASAGLAR KWIKPEN 100 UNIT/ML ~~LOC~~ SOPN: 14.0000 [IU] | PEN_INJECTOR | Freq: Two times a day (BID) | SUBCUTANEOUS | 0 refills | Status: DC

## 2020-02-07 MED ORDER — GABAPENTIN 600 MG PO TABS: 300.0000 mg | ORAL_TABLET | Freq: Every day | ORAL | 0 refills | Status: AC

## 2020-02-07 MED ORDER — SENNOSIDES-DOCUSATE SODIUM 8.6-50 MG PO TABS: 1.0000 | ORAL_TABLET | Freq: Two times a day (BID) | ORAL | 1 refills | Status: AC

## 2020-02-07 NOTE — Progress Notes (Signed)
Pt complains of stomach pain  Pt can only lift right arm halfway

## 2020-02-07 NOTE — Progress Notes (Signed)
New Patient Office Visit  Subjective:  Patient ID: Fernando Jones, male    DOB: 09/27/1961  Age: 59 y.o. MRN: [id number]  CC:  Chief Complaint  Patient presents with  . Arm Pain    right arm  . Abdominal Pain    HPI Fernando Jones presents for establishment of care. He has dialysis Tuesday, Thursday and Saturday. He is complaining of right pain and itching this is the location of his  AV fistula forearm. Pain in abdomen for 1 year and 7 months was seen in the hospital. CT done in hospital no abdominal pathology.Denies pain with stomach is full or empty no association with burning or coughing at night able to lay flat without problem. Bowel movements daily.  Past Medical History:  Diagnosis Date  . Anemia   . Erectile dysfunction 2012  . ESRD (end stage renal disease) ([place])    w/Left ureteral stone/hydronephrosis/notes 12/06/2017  . Hepatitis 1973   "? kind"  . Hyperlipidemia 2012  . Hypertension   . Leukocytosis   . Mild CAD    a. by cath 10/2018.  Marland Kitchen Pericardial effusion    a. small by echo 10/2018.  . Pulmonary nodule    a. by CT 10/2018.  . Tobacco abuse 2012   1/2 pack per day  . Type II diabetes mellitus ([place])     Past Surgical History:  Procedure Laterality Date  . APPENDECTOMY    . AV FISTULA PLACEMENT Right 12/09/2017   Procedure: ARTERIOVENOUS (AV) FISTULA CREATION;  Surgeon: Rosetta Posner, MD;  Location: [facility];  Service: Vascular;  Laterality: Right;  . CARDIAC CATHETERIZATION  05/12/2009   Archie Endo 04/07/2011  . CYSTOSCOPY/URETEROSCOPY/HOLMIUM LASER/STENT PLACEMENT Left 12/08/2017   Procedure: CYSTOSCOPY/RETROGRADE PYLEOGRAM LEFT URETEROSCOPY AND STONE EXTRACTION;  Surgeon: Festus Aloe, MD;  Location: [facility];  Service: Urology;  Laterality: Left;  . HOLMIUM LASER APPLICATION Left [id number]   Procedure: HOLMIUM LASER APPLICATION;  Surgeon: Festus Aloe, MD;  Location: [facility];  Service: Urology;  Laterality: Left;  . INSERTION OF DIALYSIS  CATHETER Right 12/09/2017   Procedure: EXCHANGE  OF TUNNELED  DIALYSIS CATHETER RIGHT INTERNAL JUGULAR.;  Surgeon: Rosetta Posner, MD;  Location: [facility];  Service: Vascular;  Laterality: Right;  . IR AV DIALY SHUNT INTRO NEEDLE/INTRACATH INITIAL W/PTA/IMG RIGHT Right 05/05/2018  . IR AV DIALY SHUNT INTRO NEEDLE/INTRACATH INITIAL W/PTA/IMG RIGHT Right 06/29/2019  . IR DIALY SHUNT INTRO NEEDLE/INTRACATH INITIAL W/IMG RIGHT Right 08/06/2019  . IR FLUORO GUIDE CV LINE RIGHT  12/07/2017  . IR REMOVAL TUN CV CATH W/O FL  06/05/2018  . IR US GUIDE VASC ACCESS RIGHT  12/07/2017  . IR US GUIDE VASC ACCESS RIGHT  05/05/2018  . IR US GUIDE VASC ACCESS RIGHT  06/29/2019  . IR US GUIDE VASC ACCESS RIGHT  08/06/2019  . LEFT HEART CATH AND CORONARY ANGIOGRAPHY N/A 10/17/2018   Procedure: LEFT HEART CATH AND CORONARY ANGIOGRAPHY;  Surgeon: Leonie Man, MD;  Location: [facility];  Service: Cardiovascular;  Laterality: N/A;  . THROMBECTOMY W/ EMBOLECTOMY Right 06/30/2018   Procedure: THROMBECTOMY and revision ARTERIOVENOUS FISTULA right RADIOCEPHALIC;  Surgeon: Angelia Mould, MD;  Location: [facility];  Service: Vascular;  Laterality: Right;  . ULTRASOUND GUIDANCE FOR VASCULAR ACCESS  10/17/2018   Procedure: Ultrasound Guidance For Vascular Access;  Surgeon: Leonie Man, MD;  Location: [facility];  Service: Cardiovascular;;    Family History  Problem Relation Age of Onset  .  Congestive Heart Failure Mother   . Diabetes Neg Hx     Social History   Socioeconomic History  . Marital status: Divorced    Spouse name: Not on file  . Number of children: Not on file  . Years of education: Not on file  . Highest education level: Not on file  Occupational History  . Not on file  Tobacco Use  . Smoking status: Former Smoker    Packs/day: 1.00    Types: Cigarettes    Quit date: 12/06/2016    Years since quitting: 3.1  . Smokeless tobacco: Never Used  Substance and Sexual Activity  . Alcohol  use: No  . Drug use: No  . Sexual activity: Not Currently  Other Topics Concern  . Not on file  Social History Narrative  . Not on file   Social Determinants of Health   Financial Resource Strain:   . Difficulty of Paying Living Expenses: Not on file  Food Insecurity:   . Worried About Charity fundraiser in the Last Year: Not on file  . Ran Out of Food in the Last Year: Not on file  Transportation Needs:   . Lack of Transportation (Medical): Not on file  . Lack of Transportation (Non-Medical): Not on file  Physical Activity:   . Days of Exercise per Week: Not on file  . Minutes of Exercise per Session: Not on file  Stress:   . Feeling of Stress : Not on file  Social Connections:   . Frequency of Communication with Friends and Family: Not on file  . Frequency of Social Gatherings with Friends and Family: Not on file  . Attends Religious Services: Not on file  . Active Member of Clubs or Organizations: Not on file  . Attends Archivist Meetings: Not on file  . Marital Status: Not on file  Intimate Partner Violence:   . Fear of Current or Ex-Partner: Not on file  . Emotionally Abused: Not on file  . Physically Abused: Not on file  . Sexually Abused: Not on file    ROS Review of Systems  Gastrointestinal: Positive for abdominal pain.  Skin:       Forearm AV fistula itching has a thrill and bruit.   All other systems reviewed and are negative.   Objective:   Today's Vitals: BP 105/70 (BP Location: Left Arm, Patient Position: Sitting, Cuff Size: Normal)   Pulse 100   Temp (!) 97.2 F (36.2 C) (Temporal)   Ht 5\' 4"  (1.626 m)   Wt 175 lb 0.6 oz (79.4 kg)   SpO2 99%   BMI 30.05 kg/m   Physical Exam Vitals reviewed.  Constitutional:      Appearance: He is well-developed.  HENT:     Head: Normocephalic.  Eyes:     Extraocular Movements: Extraocular movements intact.     Pupils: Pupils are equal, round, and reactive to light.  Cardiovascular:     Rate  and Rhythm: Normal rate and regular rhythm.  Pulmonary:     Effort: Pulmonary effort is normal.     Breath sounds: Normal breath sounds.  Abdominal:     General: Bowel sounds are normal. There is distension.     Tenderness: There is abdominal tenderness in the periumbilical area.  Skin:    General: Skin is warm and dry.          Comments: Right arm AV fistula with thrill and bruit present   Neurological:  Mental Status: He is alert and oriented to person, place, and time.  Psychiatric:        Mood and Affect: Mood normal.        Behavior: Behavior normal.     Assessment & Plan:  Fernando Jones was seen today for arm pain and abdominal pain.  Diagnoses and all orders for this visit:  Type 2 diabetes mellitus with chronic kidney disease on chronic dialysis, without long-term current use of insulin ([place]) ADA recommends the following therapeutic goals for glycemic control related to A1c measurements: Goal of therapy: Less than 6.5 hemoglobin A1c. Decrease  high in carbohydrates are the following rice, potatoes, breads, sugars, and pastas.  Reduction in the intake (eating) will assist in lowering your blood sugars. Diabetic counseling nformed me that he did not need medication for his blood sugar because they take it all out at dialysis    -     Glucose (CBG) -     Cancel: Microalbumin/Creatinine Ratio, Urine -     HM Diabetes Foot Exam -     Ambulatory referral to Ophthalmology -     Microalbumin/Creatinine Ratio, Urine; Future  Screening for malignant neoplasm of colon -     Cancel: Fecal occult blood, imunochemical([facility]); Future  Encounter to establish care Fernando Mire, NP-C will be your  (PCP) she is mastered prepared . She is skilled to diagnosed and treat illness. Also able to answer health concern as well as continuing care of varied medical conditions, not limited by cause, organ system, or diagnosis.   ESRD (end stage renal disease) ([place]) Followed by  nephrology on HD Tuesday/ Thursday/ Saturday . Informed me that he did not need medication for his blood sugar because they take it all out at dialysis   LUQ pain Review chart CT test revealed no abnormality  -     Ambulatory referral to Gastroenterology  Encounter for screening colonoscopy -     Ambulatory referral to Gastroenterology  Encounter for diabetic foot exam [facility]) Sensory exam of the foot is normal, tested with the monofilament. Good pulses, no lesions or ulcers, good peripheral pulses.  Other orders -     DULoxetine (CYMBALTA) 30 MG capsule; Take 1 capsule (30 mg total) by mouth daily. -     gabapentin (NEURONTIN) 600 MG tablet; Take 0.5 tablets (300 mg total) by mouth daily. -     Insulin Glargine (BASAGLAR KWIKPEN) 100 UNIT/ML; Inject 0.14 mLs (14 Units total) into the skin 2 (two) times daily. -     senna-docusate (SENOKOT-S) 8.6-50 MG tablet; Take 1 tablet by mouth 2 (two) times daily.     Outpatient Encounter Medications as of 02/07/2020  Medication Sig  . aspirin EC 81 MG EC tablet Take 1 tablet (81 mg total) by mouth daily.  . cinacalcet (SENSIPAR) 60 MG tablet Take 120 mg by mouth daily.  . DULoxetine (CYMBALTA) 30 MG capsule Take 1 capsule (30 mg total) by mouth daily.  . ferric citrate (AURYXIA) 1 GM 210 MG(Fe) tablet Take 1 tablet (210 mg total) by mouth 3 (three) times daily with meals. (Patient taking differently: Take 630 mg by mouth 3 (three) times daily with meals. )  . gabapentin (NEURONTIN) 600 MG tablet Take 0.5 tablets (300 mg total) by mouth daily.  . Insulin Glargine (BASAGLAR KWIKPEN) 100 UNIT/ML Inject 0.14 mLs (14 Units total) into the skin 2 (two) times daily.  . midodrine (PROAMATINE) 10 MG tablet Take 10 mg by mouth daily.   . ondansetron (  ZOFRAN) 4 MG tablet Take 1 tablet (4 mg total) by mouth daily as needed for up to 12 doses for nausea or vomiting.  . polyethylene glycol (MIRALAX / GLYCOLAX) 17 g packet Take 17 g by mouth daily.  Marland Kitchen rOPINIRole  (REQUIP) 0.25 MG tablet Take 0.25-0.5 mg by mouth at bedtime.  . senna-docusate (SENOKOT-S) 8.6-50 MG tablet Take 1 tablet by mouth 2 (two) times daily.  . [DISCONTINUED] DULoxetine (CYMBALTA) 30 MG capsule Take 1 capsule (30 mg total) by mouth daily.  . [DISCONTINUED] gabapentin (NEURONTIN) 600 MG tablet Take 0.5 tablets (300 mg total) by mouth daily.  . [DISCONTINUED] Insulin Glargine (BASAGLAR KWIKPEN) 100 UNIT/ML SOPN Inject 0.12 mLs (12 Units total) into the skin daily.  . [DISCONTINUED] senna-docusate (SENOKOT-S) 8.6-50 MG tablet Take 1 tablet by mouth 2 (two) times daily.  Marland Kitchen atorvastatin (LIPITOR) 40 MG tablet Take 1 tablet (40 mg total) by mouth at bedtime. (Patient not taking: Reported on 01/10/2020)  . methadone (DOLOPHINE) 5 MG tablet Take 5 mg by mouth every 8 (eight) hours. Take 0.5 tablets by mouth every 8 hours   No facility-administered encounter medications on file as of 02/07/2020.    Follow-up: Return in about 3 months (around 05/09/2020) for Fernando Jones ASAP (PCP in 3 months).   Kerin Perna, NP

## 2020-02-07 NOTE — Patient Instructions (Signed)
[facility] will make follow up appoint ment  Diabetes mellitus y nutricin, en adultos Diabetes Mellitus and Nutrition, Adult Si sufre de diabetes (diabetes mellitus), es muy importante tener hbitos alimenticios saludables debido a que sus niveles de Designer, television/film set sangre (glucosa) se ven afectados en gran medida por lo que come y bebe. [place] alimentos saludables en las cantidades [place], aproximadamente a la [employer], [place] ayudar a:  Aeronautical engineer glucemia.  Disminuir el riesgo de sufrir una enfermedad cardaca.  Mejorar la presin arterial.  Science writer o mantener un peso saludable. Todas las personas que sufren de diabetes son diferentes y cada una tiene necesidades diferentes en cuanto a un plan de alimentacin. El mdico puede recomendarle que trabaje con un especialista en dietas y nutricin (nutricionista) para Financial trader plan para usted. Su plan de alimentacin puede variar segn factores como:  Las caloras que necesita.  Los medicamentos que toma.  Su peso.  Sus niveles de glucemia, presin arterial y colesterol.  Su nivel de [place].  Otras afecciones que tenga, como enfermedades cardacas o renales. Cmo me afectan los carbohidratos? Los carbohidratos, o hidratos de carbono, afectan su nivel de glucemia ms que cualquier otro tipo de alimento. La ingesta de carbohidratos naturalmente aumenta la cantidad de [employer]. El recuento de carbohidratos es un mtodo destinado a Catering manager un registro de la cantidad de carbohidratos que se consumen. El recuento de carbohidratos es importante para Theatre manager la glucemia a un nivel saludable, especialmente si utiliza insulina o toma determinados medicamentos por va oral para la diabetes. Es importante conocer la cantidad de carbohidratos que se pueden ingerir en cada comida sin correr Engineer, manufacturing. Esto es Psychologist, forensic. Su nutricionista puede ayudarlo a calcular la cantidad de carbohidratos  que debe ingerir en cada comida y en cada refrigerio. Entre los alimentos que contienen carbohidratos, se incluyen:  Pan, cereal, arroz, pastas y galletas.  Papas y maz.  Guisantes, frijoles y lentejas.  Leche y Estate agent.  Lambert Mody y [place].  Postres, como pasteles, galletas, helado y caramelos. Cmo me afecta el alcohol? El alcohol puede provocar disminuciones sbitas de la glucemia (hipoglucemia), especialmente si utiliza insulina o toma determinados medicamentos por va oral para la diabetes. La hipoglucemia es una afeccin potencialmente mortal. Los sntomas de la hipoglucemia (somnolencia, mareos y confusin) son similares a los sntomas de haber consumido demasiado alcohol. Si el mdico afirma que el alcohol es seguro para usted, [place] estas pautas:  Limite el consumo de alcohol a no ms de 44medida por da si es mujer y no est [place], y a 41medidas si es hombre. Una medida equivale a 12oz (369ml) de cerveza, 5oz (175ml) de vino o 1oz (3ml) de bebidas alcohlicas de alta graduacin.  No beba con el estmago vaco.  Mantngase hidratado bebiendo agua, refrescos dietticos o t helado sin azcar.  Tenga en cuenta que los refrescos comunes, los jugos y otras bebida para Optician, dispensing pueden contener mucha azcar y se deben contar como carbohidratos. Cules son algunos consejos para seguir este plan?  Leer las etiquetas de los alimentos  Comience por leer el tamao de la porcin en la "Informacin nutricional" en las etiquetas de los alimentos envasados y las bebidas. La cantidad de caloras, carbohidratos, grasas y otros nutrientes mencionados en la etiqueta se basan en una porcin del alimento. Muchos alimentos contienen ms de una porcin por envase.  Verifique la cantidad total de gramos (g) de carbohidratos totales en una porcin. Puede calcular la  cantidad de porciones de carbohidratos al dividir el total de carbohidratos por 15. Por ejemplo, si un alimento tiene un total de  30g de carbohidratos, equivale a 2 porciones de carbohidratos.  Verifique la cantidad de gramos (g) de grasas saturadas y grasas trans en una porcin. Escoja alimentos que no contengan grasa o que tengan un bajo contenido.  Verifique la cantidad de miligramos (mg) de sal (sodio) en una porcin. La mayora de las personas deben limitar la ingesta de sodio total a menos de 2300mg  por Training and development officer.  Siempre consulte la informacin nutricional de los alimentos etiquetados como "con bajo contenido de grasa" o "sin grasa". Estos alimentos pueden tener un mayor contenido de Location manager agregada o carbohidratos refinados, y deben evitarse.  Hable con su nutricionista para identificar sus objetivos diarios en cuanto a los nutrientes mencionados en la etiqueta. Al ir de compras  Evite comprar alimentos procesados, enlatados o precocinados. Estos alimentos tienden a Special educational needs teacher mayor cantidad de [place], sodio y azcar agregada.  Compre en la zona exterior de la tienda de comestibles. Esta zona incluye frutas y verduras frescas, granos a granel, carnes frescas y productos lcteos frescos. Al cocinar  Utilice mtodos de coccin a baja temperatura, como hornear, en lugar de mtodos de coccin a alta temperatura, como frer en abundante aceite.  Cocine con aceites saludables, como el aceite de [place], canola o [place].  Evite cocinar con manteca, crema o carnes con alto contenido de grasa. Planificacin de las comidas  Coma las comidas y los refrigerios regularmente, preferentemente a la misma hora todos [place]. Evite pasar largos perodos de tiempo sin comer.  Consuma alimentos ricos en fibra, como frutas frescas, verduras, frijoles y cereales integrales. Consulte a su nutricionista sobre cuntas porciones de carbohidratos puede consumir en cada comida.  Consuma entre 4 y 6 onzas (oz) de protenas magras por da, como carnes [place], pollo, pescado, huevos o tofu. Una onza de protena magra equivale a: ? 1 onza de  carne, pollo o pescado. ? 1huevo. ?  taza de tofu.  Coma algunos alimentos por da que contengan grasas saludables, como aguacates, frutos secos, semillas y pescado. Estilo de vida  Controle su nivel de glucemia con regularidad.  Haga actividad fsica habitualmente como se lo haya indicado el mdico. Esto puede incluir lo siguiente: ? 127minutos semanales de ejercicio de intensidad moderada o alta. Esto podra incluir caminatas dinmicas, ciclismo o gimnasia acutica. ? Realizar ejercicios de elongacin y de fortalecimiento, como yoga o levantamiento de pesas, por lo menos 2veces por semana.  Tome los [employer] se lo haya indicado el mdico.  No consuma ningn producto que contenga nicotina o tabaco, como cigarrillos y Psychologist, sport and exercise. Si necesita ayuda para dejar de fumar, consulte al [employer] con un asesor o instructor en diabetes para identificar estrategias para controlar el estrs y cualquier desafo emocional y social. Preguntas para hacerle al mdico  Es necesario que consulte a Radio broadcast assistant en el cuidado de la diabetes?  Es necesario que me rena con un nutricionista?  A qu nmero puedo llamar si tengo preguntas?  Cules son los mejores momentos para controlar la glucemia? Dnde encontrar ms informacin:  Asociacin Estadounidense de la Diabetes (American Diabetes Association): diabetes.org  Academia de Nutricin y Information systems manager (Academy of Nutrition and Dietetics): www.eatright.[place] Diabetes y las Enfermedades Digestivas y [place] of Diabetes and Digestive and Kidney Diseases, NIH): [employer]inesFuneral.dk Resumen  Un plan de alimentacin saludable lo ayudar a Aeronautical engineer  glucemia y mantener un estilo de vida saludable.  Trabajar con un especialista en dietas y nutricin (nutricionista) puede ayudarlo a Insurance claims handler de alimentacin para usted.  Tenga en cuenta que los carbohidratos  (hidratos de carbono) y el alcohol tienen efectos inmediatos en sus niveles de glucemia. Es importante contar los carbohidratos que ingiere y consumir alcohol con prudencia. Esta informacin no tiene Marine scientist el consejo del mdico. Asegrese de hacerle al mdico cualquier pregunta que tenga. Document Revised: 08/02/2017 Document Reviewed: 03/14/2017 Elsevier Patient Education  [place].

## 2020-02-08 ENCOUNTER — Ambulatory Visit: Payer: Self-pay | Attending: Primary Care | Admitting: Pharmacist

## 2020-02-08 DIAGNOSIS — E1122 Type 2 diabetes mellitus with diabetic chronic kidney disease: Secondary | ICD-10-CM

## 2020-02-08 DIAGNOSIS — Z992 Dependence on renal dialysis: Secondary | ICD-10-CM

## 2020-02-08 DIAGNOSIS — N186 End stage renal disease: Secondary | ICD-10-CM

## 2020-02-08 LAB — GLUCOSE, POCT (MANUAL RESULT ENTRY): POC Glucose: 222 mg/dl — AB (ref 70–99)

## 2020-02-08 NOTE — Progress Notes (Signed)
    S:    PCP: Fernando Jones   No chief complaint on file.  Patient arrives in good spirits.  Presents for diabetes evaluation, education, and management Patient was referred and last seen by Primary Care Provider on 02/07/20.   Patient reports Diabetes was diagnosed >10 yrs ago.   Family/Social History:  - FHx: CHF; no family hx of DM reported  - Tobacco: former smoker (quit in 2018) - Alcohol: denies current alcohol use   Insurance coverage/medication affordability: uninsured   Patient denies adherence with medications.  Current diabetes medications include: Lantus 12 units (not taking)  Patient denies hypoglycemic events.  Patient reported dietary habits: Eats 1 meals/day - Denies intake of sweets  - Denies drinking soft drinks - Reports that he limits carbs   Patient-reported exercise habits: limited    Patient denies nocturia (nighttime urination).  Patient denies neuropathy (nerve pain). Patient reports visual changes since getting out of the hospital.  Patient reports self foot exams.    O:  POCT: 222  Lab Results  Component Value Date   HGBA1C 12.0 (H) 01/10/2020   There were no vitals filed for this visit.  Lipid Panel     Component Value Date/Time   CHOL 201 (H) 01/15/2019 0913   TRIG 194 (H) 01/15/2019 0913   HDL 43 01/15/2019 0913   CHOLHDL 4.7 01/15/2019 0913   CHOLHDL 7.5 Ratio 06/23/2009 2155   VLDL NOT CALC mg/dL 06/23/2009 2155   LDLCALC 119 (H) 01/15/2019 0913   Home fasting blood sugars: not checking   2 hour post-meal/random blood sugars: not checking  Clinical Atherosclerotic Cardiovascular Disease (ASCVD): No  The 10-year ASCVD risk score Mikey Bussing DC Jr., et al., 2013) is: 11.9%   Values used to calculate the score:     Age: 59 years     Sex: Male     Is Non-Hispanic African American: No     Diabetic: Yes     Tobacco smoker: No     Systolic Blood Pressure: 970 mmHg     Is BP treated: No     HDL Cholesterol: 43 mg/dL     Total  Cholesterol: 201 mg/dL    A/P: Diabetes longstanding currently uncontrolled. Patient is able to verbalize appropriate hypoglycemia management plan. Patient is not adherent with medication. He has a poor understanding of diabetes and ESRD/dialysis. In this setting, we need to follow his glycemic control closely and optimize his insulin regimen. I advised for him to resume his prescribed dose of Lantus (12 units daily) for now. In the meantime, we will have him schedule with Fernando Jones for a financial appointment. He ultimately needs an Endo referral. A1c may not be as accurate in the setting of ESRD, so it is imperative that we obtain home CBG levels. He is amenable to being checking blood sugar at home daily.  -Continued Lantus 12 units daily.  -Extensively discussed pathophysiology of diabetes and ESRD, recommended lifestyle interventions, dietary effects on blood sugar control -Counseled on s/sx of and management of hypoglycemia  Written patient instructions provided.  Total time in face to face counseling 30 minutes.   Follow up Pharmacist Clinic Visit in 1 month for review.   Fernando Jones, PharmD, [place] [phone]

## 2020-03-10 ENCOUNTER — Ambulatory Visit: Payer: Self-pay | Admitting: Pharmacist

## 2020-03-24 ENCOUNTER — Ambulatory Visit: Payer: Self-pay

## 2020-04-04 ENCOUNTER — Other Ambulatory Visit: Payer: Self-pay

## 2020-04-04 ENCOUNTER — Encounter (HOSPITAL_COMMUNITY): Payer: Self-pay | Admitting: Emergency Medicine

## 2020-04-04 ENCOUNTER — Inpatient Hospital Stay (HOSPITAL_COMMUNITY)
Admission: EM | Admit: 2020-04-04 | Discharge: 2020-04-08 | DRG: 951 | Disposition: A | Discharge status: Hospice | Payer: Self-pay | Attending: Internal Medicine | Admitting: Internal Medicine

## 2020-04-04 ENCOUNTER — Emergency Department (HOSPITAL_COMMUNITY): Payer: Self-pay

## 2020-04-04 DIAGNOSIS — Z66 Do not resuscitate: Secondary | ICD-10-CM | POA: Diagnosis present

## 2020-04-04 DIAGNOSIS — Z9115 Patient's noncompliance with renal dialysis: Secondary | ICD-10-CM

## 2020-04-04 DIAGNOSIS — Z992 Dependence on renal dialysis: Secondary | ICD-10-CM

## 2020-04-04 DIAGNOSIS — G8929 Other chronic pain: Secondary | ICD-10-CM | POA: Diagnosis present

## 2020-04-04 DIAGNOSIS — I120 Hypertensive chronic kidney disease with stage 5 chronic kidney disease or end stage renal disease: Secondary | ICD-10-CM | POA: Diagnosis present

## 2020-04-04 DIAGNOSIS — E875 Hyperkalemia: Secondary | ICD-10-CM | POA: Diagnosis present

## 2020-04-04 DIAGNOSIS — E1122 Type 2 diabetes mellitus with diabetic chronic kidney disease: Secondary | ICD-10-CM | POA: Diagnosis present

## 2020-04-04 DIAGNOSIS — Z515 Encounter for palliative care: Principal | ICD-10-CM | POA: Diagnosis present

## 2020-04-04 DIAGNOSIS — R0602 Shortness of breath: Secondary | ICD-10-CM

## 2020-04-04 DIAGNOSIS — Z20822 Contact with and (suspected) exposure to covid-19: Secondary | ICD-10-CM | POA: Diagnosis present

## 2020-04-04 DIAGNOSIS — G4700 Insomnia, unspecified: Secondary | ICD-10-CM | POA: Diagnosis present

## 2020-04-04 DIAGNOSIS — Z7189 Other specified counseling: Secondary | ICD-10-CM

## 2020-04-04 DIAGNOSIS — Z8249 Family history of ischemic heart disease and other diseases of the circulatory system: Secondary | ICD-10-CM

## 2020-04-04 DIAGNOSIS — M5412 Radiculopathy, cervical region: Secondary | ICD-10-CM | POA: Diagnosis present

## 2020-04-04 DIAGNOSIS — Z5320 Procedure and treatment not carried out because of patient's decision for unspecified reasons: Secondary | ICD-10-CM

## 2020-04-04 DIAGNOSIS — Z5329 Procedure and treatment not carried out because of patient's decision for other reasons: Secondary | ICD-10-CM

## 2020-04-04 DIAGNOSIS — N186 End stage renal disease: Secondary | ICD-10-CM | POA: Diagnosis present

## 2020-04-04 DIAGNOSIS — E8770 Fluid overload, unspecified: Secondary | ICD-10-CM | POA: Diagnosis present

## 2020-04-04 LAB — CBC
HCT: 41.5 % (ref 39.0–52.0)
Hemoglobin: 12.7 g/dL — ABNORMAL LOW (ref 13.0–17.0)
MCH: 26.3 pg (ref 26.0–34.0)
MCHC: 30.6 g/dL (ref 30.0–36.0)
MCV: 86.1 fL (ref 80.0–100.0)
Platelets: 407 10*3/uL — ABNORMAL HIGH (ref 150–400)
RBC: 4.82 MIL/uL (ref 4.22–5.81)
RDW: 16.3 % — ABNORMAL HIGH (ref 11.5–15.5)
WBC: 14.8 10*3/uL — ABNORMAL HIGH (ref 4.0–10.5)
nRBC: 0 % (ref 0.0–0.2)

## 2020-04-04 LAB — BASIC METABOLIC PANEL
Anion gap: 22 — ABNORMAL HIGH (ref 5–15)
BUN: 146 mg/dL — ABNORMAL HIGH (ref 6–20)
CO2: 14 mmol/L — ABNORMAL LOW (ref 22–32)
Calcium: 8.6 mg/dL — ABNORMAL LOW (ref 8.9–10.3)
Chloride: 101 mmol/L (ref 98–111)
Creatinine, Ser: 17.06 mg/dL — ABNORMAL HIGH (ref 0.61–1.24)
GFR calc Af Amer: 3 mL/min — ABNORMAL LOW (ref >60)
GFR calc non Af Amer: 3 mL/min — ABNORMAL LOW (ref >60)
Glucose, Bld: 170 mg/dL — ABNORMAL HIGH (ref 70–99)
Potassium: >7.5 mmol/L (ref 3.5–5.1)
Sodium: 137 mmol/L (ref 135–145)

## 2020-04-04 LAB — HEPATITIS B SURFACE ANTIGEN: Hepatitis B Surface Ag: NONREACTIVE

## 2020-04-04 LAB — RESPIRATORY PANEL BY RT PCR (FLU A&B, COVID)
Influenza A by PCR: NEGATIVE
Influenza B by PCR: NEGATIVE
SARS Coronavirus 2 by RT PCR: NEGATIVE

## 2020-04-04 LAB — TROPONIN I (HIGH SENSITIVITY)
Troponin I (High Sensitivity): 86 ng/L — ABNORMAL HIGH (ref <18)
Troponin I (High Sensitivity): 77 ng/L — ABNORMAL HIGH (ref <18)

## 2020-04-04 LAB — GLUCOSE, CAPILLARY: Glucose-Capillary: 89 mg/dL (ref 70–99)

## 2020-04-04 LAB — CBG MONITORING, ED: Glucose-Capillary: 125 mg/dL — ABNORMAL HIGH (ref 70–99)

## 2020-04-04 MED ORDER — HEPARIN SODIUM (PORCINE) 1000 UNIT/ML DIALYSIS
1000.0000 [IU] | INTRAMUSCULAR | Status: DC | PRN
  Filled 2020-04-04: qty 1

## 2020-04-04 MED ORDER — ALTEPLASE 2 MG IJ SOLR: 2.0000 mg | Freq: Once | INTRAMUSCULAR | Status: DC | PRN

## 2020-04-04 MED ORDER — HEPARIN SODIUM (PORCINE) 1000 UNIT/ML DIALYSIS
3000.0000 [IU] | INTRAMUSCULAR | Status: DC | PRN
  Filled 2020-04-04: qty 3

## 2020-04-04 MED ORDER — HEPARIN SODIUM (PORCINE) 1000 UNIT/ML DIALYSIS: 40.0000 [IU]/kg | INTRAMUSCULAR | Status: DC | PRN

## 2020-04-04 MED ORDER — BISACODYL 10 MG RE SUPP: 10.0000 mg | Freq: Every day | RECTAL | Status: DC | PRN

## 2020-04-04 MED ORDER — SODIUM CHLORIDE 0.9 % IV SOLN: 100.0000 mL | INTRAVENOUS | Status: DC | PRN

## 2020-04-04 MED ORDER — LIDOCAINE HCL (PF) 1 % IJ SOLN: 5.0000 mL | INTRAMUSCULAR | Status: DC | PRN

## 2020-04-04 MED ORDER — ONDANSETRON HCL 4 MG/2ML IJ SOLN
4.0000 mg | Freq: Four times a day (QID) | INTRAMUSCULAR | Status: DC | PRN
  Administered 2020-04-05 (×2): 4 mg via INTRAVENOUS
  Filled 2020-04-04 (×2): qty 2

## 2020-04-04 MED ORDER — BIOTENE DRY MOUTH MT LIQD: 15.0000 mL | OROMUCOSAL | Status: DC | PRN

## 2020-04-04 MED ORDER — ROPINIROLE HCL 0.25 MG PO TABS
0.2500 mg | ORAL_TABLET | Freq: Every day | ORAL | Status: DC
  Administered 2020-04-04 – 2020-04-07 (×4): 0.25 mg via ORAL
  Filled 2020-04-04 (×4): qty 1

## 2020-04-04 MED ORDER — CALCIUM GLUCONATE 10 % IV SOLN: 1.0000 g | Freq: Once | INTRAVENOUS | Status: DC

## 2020-04-04 MED ORDER — FENTANYL CITRATE (PF) 100 MCG/2ML IJ SOLN
12.5000 ug | INTRAMUSCULAR | Status: DC | PRN
  Administered 2020-04-04: 12.5 ug via INTRAVENOUS
  Administered 2020-04-04: 25 ug via INTRAVENOUS
  Filled 2020-04-04 (×2): qty 2

## 2020-04-04 MED ORDER — DIPHENHYDRAMINE-ZINC ACETATE 2-0.1 % EX CREA
TOPICAL_CREAM | Freq: Three times a day (TID) | CUTANEOUS | Status: DC | PRN
  Filled 2020-04-04: qty 28

## 2020-04-04 MED ORDER — LIDOCAINE-PRILOCAINE 2.5-2.5 % EX CREA
1.0000 "application " | TOPICAL_CREAM | CUTANEOUS | Status: DC | PRN
  Filled 2020-04-04: qty 5

## 2020-04-04 MED ORDER — ACETAMINOPHEN 325 MG PO TABS: 650.0000 mg | ORAL_TABLET | Freq: Four times a day (QID) | ORAL | Status: DC | PRN

## 2020-04-04 MED ORDER — HEPARIN SODIUM (PORCINE) 5000 UNIT/ML IJ SOLN
5000.0000 [IU] | Freq: Three times a day (TID) | INTRAMUSCULAR | Status: DC
  Administered 2020-04-04 – 2020-04-05 (×2): 5000 [IU] via SUBCUTANEOUS
  Filled 2020-04-04 (×2): qty 1

## 2020-04-04 MED ORDER — PENTAFLUOROPROP-TETRAFLUOROETH EX AERO
1.0000 "application " | INHALATION_SPRAY | CUTANEOUS | Status: DC | PRN
  Filled 2020-04-04: qty 116

## 2020-04-04 MED ORDER — ALBUTEROL SULFATE (2.5 MG/3ML) 0.083% IN NEBU
10.0000 mg | INHALATION_SOLUTION | Freq: Once | RESPIRATORY_TRACT | Status: DC
  Filled 2020-04-04: qty 12

## 2020-04-04 MED ORDER — INSULIN ASPART 100 UNIT/ML IV SOLN
5.0000 [IU] | Freq: Once | INTRAVENOUS | Status: AC
  Administered 2020-04-04: 5 [IU] via INTRAVENOUS

## 2020-04-04 MED ORDER — DIPHENHYDRAMINE HCL 50 MG/ML IJ SOLN
25.0000 mg | Freq: Once | INTRAMUSCULAR | Status: AC
  Administered 2020-04-04: 25 mg via INTRAVENOUS

## 2020-04-04 MED ORDER — DIPHENHYDRAMINE HCL 25 MG PO CAPS
25.0000 mg | ORAL_CAPSULE | Freq: Three times a day (TID) | ORAL | Status: DC | PRN
  Administered 2020-04-06 – 2020-04-07 (×2): 25 mg via ORAL
  Filled 2020-04-04 (×2): qty 1

## 2020-04-04 MED ORDER — CHLORHEXIDINE GLUCONATE CLOTH 2 % EX PADS: 6.0000 | MEDICATED_PAD | Freq: Every day | CUTANEOUS | Status: DC

## 2020-04-04 MED ORDER — INSULIN ASPART 100 UNIT/ML ~~LOC~~ SOLN
0.0000 [IU] | Freq: Three times a day (TID) | SUBCUTANEOUS | Status: DC
Start: 2020-04-04 — End: 2020-04-04

## 2020-04-04 MED ORDER — GLYCOPYRROLATE 0.2 MG/ML IJ SOLN: 0.4000 mg | Freq: Four times a day (QID) | INTRAMUSCULAR | Status: DC | PRN

## 2020-04-04 MED ORDER — DIPHENHYDRAMINE HCL 50 MG/ML IJ SOLN
INTRAMUSCULAR | Status: AC
  Filled 2020-04-04: qty 1

## 2020-04-04 MED ORDER — DULOXETINE HCL 30 MG PO CPEP
30.0000 mg | ORAL_CAPSULE | Freq: Every day | ORAL | Status: DC
  Administered 2020-04-05 – 2020-04-07 (×3): 30 mg via ORAL
  Filled 2020-04-04 (×3): qty 1

## 2020-04-04 MED ORDER — ROPINIROLE HCL 0.25 MG PO TABS
0.2500 mg | ORAL_TABLET | Freq: Every day | ORAL | Status: DC
  Filled 2020-04-04: qty 2

## 2020-04-04 MED ORDER — DEXTROSE 50 % IV SOLN
1.0000 | Freq: Once | INTRAVENOUS | Status: AC
  Administered 2020-04-04: 50 mL via INTRAVENOUS
  Filled 2020-04-04: qty 50

## 2020-04-04 MED ORDER — LORAZEPAM 2 MG/ML IJ SOLN
0.5000 mg | INTRAMUSCULAR | Status: DC | PRN
  Administered 2020-04-05: 0.5 mg via INTRAVENOUS
  Administered 2020-04-06: 1 mg via INTRAVENOUS
  Filled 2020-04-04 (×2): qty 1

## 2020-04-04 MED ORDER — HYPROMELLOSE (GONIOSCOPIC) 2.5 % OP SOLN
1.0000 [drp] | Freq: Four times a day (QID) | OPHTHALMIC | Status: DC | PRN
  Filled 2020-04-04: qty 15

## 2020-04-04 MED ORDER — MORPHINE SULFATE (PF) 4 MG/ML IV SOLN
3.0000 mg | Freq: Once | INTRAVENOUS | Status: AC
  Administered 2020-04-04: 3 mg via INTRAVENOUS
  Filled 2020-04-04: qty 1

## 2020-04-04 MED ORDER — SODIUM CHLORIDE 0.9% FLUSH: 3.0000 mL | Freq: Once | INTRAVENOUS | Status: DC

## 2020-04-04 NOTE — ED Provider Notes (Signed)
Taken to dialysis now.  Anticipate return to Ed afterwards, awaiting word from social worker regarding hospice placement   Wyvonnia Dusky, MD 04/04/20 1109

## 2020-04-04 NOTE — Plan of Care (Signed)
  Problem: Education: Goal: Knowledge of General Education information will improve Description Including pain rating scale, medication(s)/side effects and non-pharmacologic comfort measures Outcome: Progressing   

## 2020-04-04 NOTE — ED Triage Notes (Addendum)
Pt reports sob and leg swelling since yesterday. Pt visibly sob with labored respirations in triage. O2 sats 97% on room air. Pt reports he is currently on dialysis, has not been since last Saturday.

## 2020-04-04 NOTE — Progress Notes (Addendum)
1:15pm: CSW spoke with Olivia Mackie of [employer] who states this patient has been approved for the hospice home in [place]. There are no beds available today, but one may come available this weekend.  9:55am: CSW made referral to [facility] for the inpatient hospice home - clinical information was faxed over for review.  CSW updated Dr. Langston Masker of information.  Madilyn Fireman, MSW, LCSW-A Transitions of Care  Clinical Social Worker  [facility] Emergency Departments  Medical ICU [phone]

## 2020-04-04 NOTE — Consult Note (Addendum)
Palliative Medicine Inpatient Consult Note  Reason for consult: Comfort oriented care  HPI:  Per intake H&P --> Fernando Jones is a 59 y.o. male with a pertinent past medical history of ESRD (TTS), HLD, HTN, CAD, T2DM. He presents today with progressive SOB and fatigue in the setting of having missed two recent HD treatments.   Fernando Jones is known to the palliative care team and discussions had taken place in February circulating around continuation of hemodialysis which at the time he wanted to continue doing.   Palliative care was asked to evaluate patient in the setting of transitioning to comfort measures as patient shared with the ER staff that he has declined and does not feel that he leads an acceptable quality of life.   Clinical Assessment/Goals of Care: I have reviewed medical records including EPIC notes, labs and imaging, received report from bedside RN, assessed the patient.    I met with Fernando Jones to further discuss diagnosis prognosis, GOC, EOL wishes, disposition and options.   I introduced Palliative Medicine as specialized medical care for people living with serious illness. It focuses on providing relief from the symptoms and stress of a serious illness. The goal is to improve quality of life for both the patient and the family.  A brief life review was completed. Fernando Jones is from [place].  His wife and children still live in [place]. He shares that he has a sister who he plans to inform of his situation. He lives alone.  He use to work on the computer in his home but has recently stopped.  He started on HD approximately 3 years ago.   He does not like the debility that has accompanied his frequent treatments.    Fernando Jones complains of worsening weakness overtime leading to inability to walk. He has constant pain in the setting of radiculopathy. He shares that his quality of life is extremely poor.   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 He states that he has given his  present situation a lot of thought and feels at peace with his decision to stop dialysis and focus on symptom relief. Verified DNAR/DNI code status.    I offered for the chaplain to come by to provide additional support which he was open to.   We talked about transition to comfort measures in house and what that would entail inclusive of medications to control pain, dyspnea, agitation, nausea, itching, and hiccups.  We discussed stopping all uneccessary measures such as blood draws, needle sticks, and frequent vital signs. He vocalized understanding.   I told Fernando Jones that we will be present to address any additional needs he may have. He was very thankful.   SUMMARY OF RECOMMENDATIONS   DNAR/DNI  Comfort oriented care  TOC --> Residential Hospice   Code Status/Advance Care Planning: DNAR/DNI  Recommendations for Symptom Management:  Dyspnea: Pain  - Fentanyl 12.5-25mcg IV Q1H PRN Fever:  - Tylenol 665m PO Q6H PRN Agitation: Anxiety:  - Lorazepam 0.5-130m IV  Q1H PRN Nausea:  - Zofran 66m110mV Q6H PRN  Secretions:  - Glycopyrrolate 0.66mg58m QID PRN Dry Eyes:  - Artificial Tears PRN Xerostomia:  - Biotene 15ml69m  - BID oral care Pruritis:  - Benadryl PRN  - Benadryl lotion Lotion Constipation:  - Bisacodyl 10mg 35mRN QDay Spiritual:  - Chaplain consult   Palliative Prophylaxis:   Constipation, Oral Care, Turn Q2H   Additional Recommendations (Limitations, Scope, Preferences):  Comfort oriented care   Psycho-social/Spiritual:  Desire for further Chaplaincy support: Yes  Additional Recommendations: Education on hospice, end of life care   Prognosis:   Poor, once HD stops days to weeks   Discharge Planning: Transition to residential hospice home when a bed becomes available.    PPS: 20-30%    This conversation/these recommendations were discussed with patient primary care team, Dr. Benjamine Mola  Time In:  1600 Time Out: 1710 Total Time: 79 Greater  than 50%  of this time was spent counseling and coordinating care related to the above assessment and plan.  [place] Team Team Cell Phone: [phone] Please utilize secure chat with additional questions, if there is no response within 30 minutes please call the above phone number  Palliative Medicine Team providers are available by phone from 7am to 7pm daily and can be reached through the team cell phone.  Should this patient require assistance outside of these hours, please call the patient's attending physician.

## 2020-04-04 NOTE — H&P (Signed)
Date: 04/04/2020               Patient Name:  Fernando Jones MRN: [id number]  DOB: 1961-10-07 Age / Sex: 59 y.o., male   PCP: Kerin Perna, NP         Medical Service: Internal Medicine Teaching Service         Attending Physician: Dr. Bartholomew Crews, MD    First Contact: Dr. Benjamine Mola Pager: [phone]  Second Contact: Dr. Sherry Ruffing Pager: [phone]       After Hours (After 5p/  First Contact Pager: [phone]  weekends / holidays): Second Contact Pager: [phone]   Chief Complaint: Requesting comfort care  History of Present Illness:  Conversation with patient was conducted using language-line Spanish interpreter  Patient is a 59 year old male with past medical history significant for end-stage renal disease, hypertension, and anemia who presents requesting comfort care, cessation of dialysis.  Patient reports that his last dialysis session was on Saturday.  He normally has dialysis Tuesday, Thursday, and Saturday but did not attend subsequent sessions due to side effects.  These side effects include severe itching, abdominal pain, and fatigue. Patient reports that he came to the emergency room because of increased shortness of breath.  Patient discusses that he has thought about a comfort care approach for a long time.  Patient was counseled that without dialysis he would likely die in a couple of weeks.  Patient articulates understanding.  Patient confirms that his main goal is comfort, and he does not want life preserving therapy. Patient articulates having a very poor quality of life with constant fatigue and pain. Patient counseled that we would abide by his wishes and he is always able to change his mind on his goals of care.  Meds:  *Duloxetine 30 mg daily *Naproxen - unknown dosage *Patient denies taking any other medications including insulin and methadone  Allergies: Allergies as of 04/04/2020  . (No Known Allergies)   Past Medical History:  Diagnosis Date    . Anemia   . Erectile dysfunction 2012  . ESRD (end stage renal disease) ([place])    w/Left ureteral stone/hydronephrosis/notes 12/06/2017  . Hepatitis 1973   "? kind"  . Hyperlipidemia 2012  . Hypertension   . Leukocytosis   . Mild CAD    a. by cath 10/2018.  Marland Kitchen Pericardial effusion    a. small by echo 10/2018.  . Pulmonary nodule    a. by CT 10/2018.  . Tobacco abuse 2012   1/2 pack per day  . Type II diabetes mellitus (HCC)     Family History:  Family History  Problem Relation Age of Onset  . Congestive Heart Failure Mother   . Diabetes Neg Hx    Social History:  Social History   Tobacco Use  . Smoking status: Former Smoker    Packs/day: 1.00    Types: Cigarettes    Quit date: 12/06/2016    Years since quitting: 3.3  . Smokeless tobacco: Never Used  Substance Use Topics  . Alcohol use: No  . Drug use: No    Review of Systems: A complete ROS was negative except as per HPI.   Physical Exam: Blood pressure 107/73, pulse (!) 118, temperature 99.2 F (37.3 C), temperature source Oral, resp. rate (!) 30, weight 80 kg, SpO2 96 %. Physical Exam  Constitutional: He is well-developed, well-nourished, and in no distress.  HENT:  Head: Normocephalic and atraumatic.  Eyes: EOM are normal. Right eye  exhibits no discharge. Left eye exhibits no discharge.  Neck: No tracheal deviation present.  Cardiovascular: Normal rate and regular rhythm. Exam reveals no gallop and no friction rub.  No murmur heard. Pulmonary/Chest: Effort normal and breath sounds normal. No respiratory distress. He has no wheezes. He has no rales.  Respiratory exam limited by patient positioning, pain  Abdominal: Soft. He exhibits no distension. There is no abdominal tenderness. There is no rebound and no guarding.  Musculoskeletal:        General: No tenderness, deformity or edema. Normal range of motion.     Cervical back: Normal range of motion.  Neurological: He is alert. Coordination normal.  Skin:  Skin is warm and dry. No rash noted. He is not diaphoretic. No erythema.  Psychiatric: Memory and judgment normal.   EKG: personally reviewed my interpretation is sinus tachycardia, peaked T waves  CXR: personally reviewed my interpretation is no acute abnormalities  CBC Latest Ref Rng & Units 04/04/2020 01/15/2020 01/12/2020  WBC 4.0 - 10.5 K/uL 14.8(H) 13.6(H) 12.1(H)  Hemoglobin 13.0 - 17.0 g/dL 12.7(L) 15.3 15.1  Hematocrit 39.0 - 52.0 % 41.5 47.8 47.9  Platelets 150 - 400 K/uL 407(H) 418(H) 380   CMP Latest Ref Rng & Units 04/04/2020 01/15/2020 01/14/2020  Glucose 70 - 99 mg/dL 170(H) 129(H) 122(H)  BUN 6 - 20 mg/dL 146(H) 70(H) 54(H)  Creatinine 0.61 - 1.24 mg/dL 17.06(H) 12.32(H) 9.16(H)  Sodium 135 - 145 mmol/L 137 133(L) 138  Potassium 3.5 - 5.1 mmol/L >7.5(HH) 4.6 4.7  Chloride 98 - 111 mmol/L 101 86(L) 94(L)  CO2 22 - 32 mmol/L 14(L) 26 23  Calcium 8.9 - 10.3 mg/dL 8.6(L) 8.0(L) 8.4(L)  Total Protein 6.5 - 8.1 g/dL - - -  Total Bilirubin 0.3 - 1.2 mg/dL - - -  Alkaline Phos 38 - 126 U/L - - -  AST 15 - 41 U/L - - -  ALT 0 - 44 U/L - - -    Assessment & Plan by Problem: Active Problems:   ESRD (end stage renal disease) [facility])  Patient is a 60 year old male with past medical history significant for end-stage renal disease, hypertension, and anemia who presented on 04/04/2020 requesting comfort care, cessation of dialysis.  # ESRD: Patient presented with increased shortness of breath in the setting of missing 2 dialysis sessions.  On presentation, potassium greater than 7.5, creatinine 17.1, bicarb 14, EKG changes consistent with hyperkalemia.  Patient was given D50, insulin, calcium gluconate and taken for emergent dialysis.  Patient articulated that he does not desire to have further dialysis session, understands the indications at this decision, has capacity to make this decision. *No further HD per patient's wishes *Palliative care consult, we appreciate their  recommendations *Continue duloxetine 30 mg daily  # T2DM: Patient states he is not taking any insulin at home. Was previously on regimen of glargine 14 units twice daily. Glucose of 170 on presentation. Will continue to hold any insulin consistent with patient's wishes for comfort   Dispo: Admit patient to Inpatient with expected length of stay greater than 2 midnights.  Signed: Jeanmarie Hubert, MD 04/04/2020, 3:49 PM

## 2020-04-04 NOTE — ED Provider Notes (Signed)
[facility] EMERGENCY DEPARTMENT Provider Note   CSN: [id number] Arrival date & time: 04/04/20  9735     History Chief Complaint  Patient presents with  . Shortness of Breath    Of note, patient speaks good [place] and declined spanish translator services  Fernando Jones is a 59 y.o. male w/ ESRD on dialysis Tues, Thurs, Sat, presenting to the emergency department with shortness of breath and fatigue.  The patient reports that he has missed his past 2 dialysis sessions and last went to dialysis on Saturday.  He says he has progressively felt more more short of breath and weak for the past 2 mornings.  He says he feels miserable.  He suffers from chronic pain and cervical radiculopathy.  He was started on dialysis earlier this year and reports that the dialysis makes him feel miserable.  He has been evaluated by palliative care during her prior hospitalization and had ongoing discussions about transition to hospice care, stating that he no longer wants to proceed with dialysis.  It was made clear to him that he would die without hemodialysis.  He told me that he continued for a while on dialysis, but now he is ready to stop.  He says he lives by himself, no longer works, has a very poor quality of life.  He says he is in pain all the time.  He states that getting to dialysis is extremely difficult to him it leaves him feeling even more exhausted and in pain.  He is interested in getting dialyzed today for his acute shortness of breath, but then states that he intends to stop dialysis, and would prefer to transition to hospice care for end-of-life.  He says his family is in [place] and there is no one he wants me to contact.   HPI     Past Medical History:  Diagnosis Date  . Anemia   . Erectile dysfunction 2012  . ESRD (end stage renal disease) ([place])    w/Left ureteral stone/hydronephrosis/notes 12/06/2017  . Hepatitis 1973   "? kind"  . Hyperlipidemia 2012  .  Hypertension   . Leukocytosis   . Mild CAD    a. by cath 10/2018.  Marland Kitchen Pericardial effusion    a. small by echo 10/2018.  . Pulmonary nodule    a. by CT 10/2018.  . Tobacco abuse 2012   1/2 pack per day  . Type II diabetes mellitus [facility])     Patient Active Problem List   Diagnosis Date Noted  . Abdominal pain   . Palliative care encounter   . Right arm pain 01/11/2020  . Cervical radiculopathy 01/11/2020  . Chest pain 10/14/2018  . Hyperkalemia 06/29/2018  . Malnutrition of moderate degree 12/15/2017  . ESRD (end stage renal disease) ([place]) 12/06/2017  . Severe Vitamin D deficiency 10/14/2016  . Diastolic dysfunction with acute on chronic heart failure ([place]) 10/13/2016  . Acute respiratory failure with hypoxia ([place]) 10/12/2016  . Anasarca 10/12/2016  . Acute renal failure (ARF) ([place]) 10/12/2016  . Hypertensive urgency 10/12/2016  . Acute respiratory failure ([place]) 10/12/2016  . ERECTILE DYSFUNCTION, NON-ORGANIC 06/23/2009  . DM (diabetes mellitus), type 2 with renal complications ([place]) 32/99/2426  . HYPERLIPIDEMIA 05/20/2009  . TOBACCO ABUSE 05/20/2009  . Essential hypertension 05/20/2009  . CONSTIPATION 05/20/2009    Past Surgical History:  Procedure Laterality Date  . APPENDECTOMY    . AV FISTULA PLACEMENT Right 12/09/2017   Procedure: ARTERIOVENOUS (AV) FISTULA CREATION;  Surgeon:  Rosetta Posner, MD;  Location: [facility] OR;  Service: Vascular;  Laterality: Right;  . CARDIAC CATHETERIZATION  05/12/2009   Archie Endo 04/07/2011  . CYSTOSCOPY/URETEROSCOPY/HOLMIUM LASER/STENT PLACEMENT Left 12/08/2017   Procedure: CYSTOSCOPY/RETROGRADE PYLEOGRAM LEFT URETEROSCOPY AND STONE EXTRACTION;  Surgeon: Festus Aloe, MD;  Location: [facility] ORS;  Service: Urology;  Laterality: Left;  . HOLMIUM LASER APPLICATION Left 01/14/9241   Procedure: HOLMIUM LASER APPLICATION;  Surgeon: Festus Aloe, MD;  Location: [facility] ORS;  Service: Urology;  Laterality: Left;  . INSERTION OF DIALYSIS CATHETER Right 12/09/2017    Procedure: EXCHANGE  OF TUNNELED  DIALYSIS CATHETER RIGHT INTERNAL JUGULAR.;  Surgeon: Rosetta Posner, MD;  Location: [facility];  Service: Vascular;  Laterality: Right;  . IR AV DIALY SHUNT INTRO NEEDLE/INTRACATH INITIAL W/PTA/IMG RIGHT Right 05/05/2018  . IR AV DIALY SHUNT INTRO NEEDLE/INTRACATH INITIAL W/PTA/IMG RIGHT Right 06/29/2019  . IR DIALY SHUNT INTRO NEEDLE/INTRACATH INITIAL W/IMG RIGHT Right 08/06/2019  . IR FLUORO GUIDE CV LINE RIGHT  12/07/2017  . IR REMOVAL TUN CV CATH W/O FL  06/05/2018  . IR US GUIDE VASC ACCESS RIGHT  12/07/2017  . IR US GUIDE VASC ACCESS RIGHT  05/05/2018  . IR US GUIDE VASC ACCESS RIGHT  06/29/2019  . IR US GUIDE VASC ACCESS RIGHT  08/06/2019  . LEFT HEART CATH AND CORONARY ANGIOGRAPHY N/A 10/17/2018   Procedure: LEFT HEART CATH AND CORONARY ANGIOGRAPHY;  Surgeon: Leonie Man, MD;  Location: [facility] CV LAB;  Service: Cardiovascular;  Laterality: N/A;  . THROMBECTOMY W/ EMBOLECTOMY Right 06/30/2018   Procedure: THROMBECTOMY and revision ARTERIOVENOUS FISTULA right RADIOCEPHALIC;  Surgeon: Angelia Mould, MD;  Location: [facility];  Service: Vascular;  Laterality: Right;  . ULTRASOUND GUIDANCE FOR VASCULAR ACCESS  10/17/2018   Procedure: Ultrasound Guidance For Vascular Access;  Surgeon: Leonie Man, MD;  Location: [facility] CV LAB;  Service: Cardiovascular;;       Family History  Problem Relation Age of Onset  . Congestive Heart Failure Mother   . Diabetes Neg Hx     Social History   Tobacco Use  . Smoking status: Former Smoker    Packs/day: 1.00    Types: Cigarettes    Quit date: 12/06/2016    Years since quitting: 3.3  . Smokeless tobacco: Never Used  Substance Use Topics  . Alcohol use: No  . Drug use: No    Home Medications Prior to Admission medications   Medication Sig Start Date End Date Taking? Authorizing Provider  aspirin EC 81 MG EC tablet Take 1 tablet (81 mg total) by mouth daily. 12/17/17   Bonnell Public, MD    atorvastatin (LIPITOR) 40 MG tablet Take 1 tablet (40 mg total) by mouth at bedtime. Patient not taking: Reported on 01/10/2020 01/16/19   Almyra Deforest, PA  cinacalcet (SENSIPAR) 60 MG tablet Take 120 mg by mouth daily.    [provider]  DULoxetine (CYMBALTA) 30 MG capsule Take 1 capsule (30 mg total) by mouth daily. 02/07/20 03/08/20  Kerin Perna, NP  ferric citrate (AURYXIA) 1 GM 210 MG(Fe) tablet Take 1 tablet (210 mg total) by mouth 3 (three) times daily with meals. Patient taking differently: Take 630 mg by mouth 3 (three) times daily with meals.  07/02/18   Kayleen Memos, DO  gabapentin (NEURONTIN) 600 MG tablet Take 0.5 tablets (300 mg total) by mouth daily. 02/07/20 03/08/20  Kerin Perna, NP  Insulin Glargine (BASAGLAR KWIKPEN) 100 UNIT/ML Inject 0.14 mLs (14 Units  total) into the skin 2 (two) times daily. 02/07/20   Kerin Perna, NP  methadone (DOLOPHINE) 5 MG tablet Take 5 mg by mouth every 8 (eight) hours. Take 0.5 tablets by mouth every 8 hours 01/17/20   [provider]  midodrine (PROAMATINE) 10 MG tablet Take 10 mg by mouth daily.  12/17/19   [provider]  ondansetron (ZOFRAN) 4 MG tablet Take 1 tablet (4 mg total) by mouth daily as needed for up to 12 doses for nausea or vomiting. 01/15/20   Marianna Payment, MD  polyethylene glycol (MIRALAX / GLYCOLAX) 17 g packet Take 17 g by mouth daily. 01/15/20   Marianna Payment, MD  rOPINIRole (REQUIP) 0.25 MG tablet Take 0.25-0.5 mg by mouth at bedtime. 09/29/18   [provider]    Allergies    Patient has no known allergies.  Review of Systems   Review of Systems  Constitutional: Negative for chills and fever.  Eyes: Negative for photophobia and visual disturbance.  Respiratory: Positive for cough and shortness of breath.   Cardiovascular: Positive for chest pain. Negative for palpitations.  Gastrointestinal: Positive for nausea. Negative for abdominal pain and vomiting.  Musculoskeletal:  Positive for arthralgias and myalgias.  Skin: Negative for color change and rash.  Allergic/Immunologic: Negative for food allergies and immunocompromised state.  Neurological: Positive for light-headedness and headaches. Negative for syncope.  Psychiatric/Behavioral: Negative for agitation and confusion.  All other systems reviewed and are negative.   Physical Exam Updated Vital Signs BP (!) 222/111   Pulse (!) 123   Temp 98.2 F (36.8 C) (Oral)   Resp (!) 30   SpO2 98%   Physical Exam Vitals and nursing note reviewed.  Constitutional:      Appearance: He is well-developed.     Comments: Sitting upright, breathing heavily  HENT:     Head: Normocephalic and atraumatic.  Eyes:     Conjunctiva/sclera: Conjunctivae normal.  Cardiovascular:     Rate and Rhythm: Normal rate and regular rhythm.  Pulmonary:     Comments: 95% on room air RR 30 Speaking in short sentences Crackles in bilateral lung fields Abdominal:     Palpations: Abdomen is soft.     Tenderness: There is no abdominal tenderness.  Musculoskeletal:     Cervical back: Neck supple.     Comments: Fistula site right arm  Skin:    General: Skin is warm and dry.  Neurological:     Mental Status: He is alert.  Psychiatric:        Mood and Affect: Mood normal.        Behavior: Behavior normal.     ED Results / Procedures / Treatments   Labs (all labs ordered are listed, but only abnormal results are displayed) Labs Reviewed  CBC - Abnormal; Notable for the following components:      Result Value   WBC 14.8 (*)    Hemoglobin 12.7 (*)    RDW 16.3 (*)    Platelets 407 (*)    All other components within normal limits  RESPIRATORY PANEL BY RT PCR (FLU A&B, COVID)  BASIC METABOLIC PANEL  TROPONIN I (HIGH SENSITIVITY)  TROPONIN I (HIGH SENSITIVITY)    EKG EKG Interpretation  Date/Time:  Friday April 04 2020 08:15:16 EDT Ventricular Rate:  123 PR Interval:  166 QRS Duration: 104 QT Interval:  310 QTC  Calculation: 443 R Axis:   85 Text Interpretation: Sinus tachycardia Peaked T waves, no STEMI Confirmed by Octaviano Glow (  [id number]) on 04/04/2020 8:53:26 AM   Radiology DG Chest 2 View  Result Date: 04/04/2020 CLINICAL DATA:  Shortness of breath since yesterday EXAM: CHEST - 2 VIEW COMPARISON:  01/10/2020 FINDINGS: Upper normal heart size. Slight pulmonary vascular congestion. Mediastinal contours normal. Atherosclerotic calcification aorta. Peribronchial thickening with chronic accentuation of interstitial markings and subsegmental atelectasis at LEFT costophrenic angle. No acute infiltrate, pleural effusion or pneumothorax. Osseous structures unremarkable. IMPRESSION: Bronchitic changes with subsegmental atelectasis and chronic accentuation of interstitial markings at LEFT apex. No acute abnormalities. Aortic Atherosclerosis (ICD10-I70.0). Electronically Signed   By: Lavonia Dana M.D.   On: 04/04/2020 08:59    Procedures Procedures (including critical care time)  Medications Ordered in ED Medications  sodium chloride flush (NS) 0.9 % injection 3 mL (has no administration in time range)  Chlorhexidine Gluconate Cloth 2 % PADS 6 each (has no administration in time range)  pentafluoroprop-tetrafluoroeth (GEBAUERS) aerosol 1 application (has no administration in time range)  lidocaine (PF) (XYLOCAINE) 1 % injection 5 mL (has no administration in time range)  lidocaine-prilocaine (EMLA) cream 1 application (has no administration in time range)  0.9 %  sodium chloride infusion (has no administration in time range)  0.9 %  sodium chloride infusion (has no administration in time range)  heparin injection 1,000 Units (has no administration in time range)  alteplase (CATHFLO ACTIVASE) injection 2 mg (has no administration in time range)  heparin injection 40 Units/kg (has no administration in time range)  heparin injection 3,000 Units (has no administration in time range)  morphine 4 MG/ML injection  3 mg (has no administration in time range)    ED Course  I have reviewed the triage vital signs and the nursing notes.  Pertinent labs & imaging results that were available during my care of the patient were reviewed by me and considered in my medical decision making (see chart for details).  59 yo male presenting to ED with SOB, hypertensive, I suspect he is volume overloaded from missing dialysis.  He does not make any urine.  He does have crackles on exam.  Is also hypertensive and tachypneic.  He is not hypoxic on room air.  I do think is stable at this time and not requiring BiPAP.  He has a DNR/DNI  I discussed the case Dr. Posey Pronto from nephrology recommended urgent dialysis, he suspects this can be done this morning.  I had a long discussion with the patient, and return for secondary discussion regarding his wishes to transition to hospice care after the session of dialysis.  I do believe he is of a sound mind and understands that he is asking for end-of-life care.  I made it clear to him that he would likely pass away within the next week while at hospice care, once he stops dialysis and his other medications.  I asked him of his family affairs were in order and if he was willing and ready to go directly to hospice care after his treatment today.  He says he is willing to do so, and there is nothing else he needs to do in his personal life at the moment.  I will reach out to our transition of care team to see if it is possible to get him placed directly to inpatient hospice from the emergency department today.  Morphine for pain control here.  Clinical Course as of Apr 04 1005  Fri Apr 04, 2020  6269 I spoke to dr patel from nephrology who will try to  dialyze patient around 11 am.  Also spoke to CM and SW about possible direct transfer to hospice after dialysis   [MT]  0931 I did offer to speak to the patient's family and he said there is no one that he wants me to talk to.   [MT]      Clinical Course User Index [MT] Taeja Debellis, Carola Rhine, MD    Final Clinical Impression(s) / ED Diagnoses Final diagnoses:  End stage renal disease ([place])  Shortness of breath  Encounter for end of life care    Rx / DC Orders ED Discharge Orders    None       Wyvonnia Dusky, MD 04/04/20 1006

## 2020-04-04 NOTE — Progress Notes (Signed)
Renal Navigator notes plans for patient to stop all further HD after his treatment today and transition to comfort care.  Navigator notified OP HD clinic[facility].  Alphonzo Cruise, [place] Renal Navigator [phone]

## 2020-04-04 NOTE — ED Provider Notes (Signed)
.  Critical Care Performed by: Wyvonnia Dusky, MD Authorized by: Wyvonnia Dusky, MD   Critical care provider statement:    Critical care time (minutes):  45   Critical care was necessary to treat or prevent imminent or life-threatening deterioration of the following conditions:  Metabolic crisis   Critical care was time spent personally by me on the following activities:  Discussions with consultants, evaluation of patient's response to treatment, examination of patient, ordering and performing treatments and interventions, ordering and review of laboratory studies, ordering and review of radiographic studies, pulse oximetry, re-evaluation of patient's condition, obtaining history from patient or surrogate and review of old charts Comments:     Hyperkalemia requiring IV medications, repeat ECG, emergent dialysis      Wyvonnia Dusky, MD 04/04/20 1717

## 2020-04-04 NOTE — Progress Notes (Addendum)
   NAME:  Fernando Jones, MRN:  [id number], DOB:  1961-01-17, LOS: 0 ADMISSION DATE:  04/04/2020   Brief History  59 yo male with ESRD on HD who presented to [facility] on 04/04/20 requesting discontinuation of HD and wished to be transitioned to comfort care. Pt was transitioned to comfort care and TOC consulted for placement in residential hospice.  Subjective  No overnight events. Patient reports that he is having some abdominal pain and feels like his not able to take deep breaths. Because he has small lung capacity. We discussed that we have medications for this that he can ask for.   Significant Hospital Events   4/30 hospital admission; transitioned to comfort care; palliative care consulted 5/1 some abdominal discomfort and difficulty taking deep breaths. TOC working on hospice facility discharge  Objective   Blood pressure 118/69, pulse (!) 115, temperature 99.7 F (37.6 C), temperature source Oral, resp. rate 20, weight 80 kg, SpO2 100 %.     Intake/Output Summary (Last 24 hours) at 04/04/2020 1803 Last data filed at 04/04/2020 1550 Gross per 24 hour  Intake --  Output 5000 ml  Net -5000 ml   Filed Weights   04/04/20 1300  Weight: 80 kg    Examination: GENERAL: in no acute distress PULMONARY: breathing comfortably on 2L [place] NEURO: alert and oriented.   Consults:  Palliative  Labs    CBC Latest Ref Rng & Units 04/04/2020 01/15/2020 01/12/2020  WBC 4.0 - 10.5 K/uL 14.8(H) 13.6(H) 12.1(H)  Hemoglobin 13.0 - 17.0 g/dL 12.7(L) 15.3 15.1  Hematocrit 39.0 - 52.0 % 41.5 47.8 47.9  Platelets 150 - 400 K/uL 407(H) 418(H) 380   BMP Latest Ref Rng & Units 04/04/2020 01/15/2020 01/14/2020  Glucose 70 - 99 mg/dL 170(H) 129(H) 122(H)  BUN 6 - 20 mg/dL 146(H) 70(H) 54(H)  Creatinine 0.61 - 1.24 mg/dL 17.06(H) 12.32(H) 9.16(H)  Sodium 135 - 145 mmol/L 137 133(L) 138  Potassium 3.5 - 5.1 mmol/L >7.5(HH) 4.6 4.7  Chloride 98 - 111 mmol/L 101 86(L) 94(L)  CO2 22 - 32 mmol/L 14(L) 26 23   Calcium 8.9 - 10.3 mg/dL 8.6(L) 8.0(L) 8.4(L)    Summary  59 yo male with ESRD on HD who presented to [facility] on 4/30 requesting discontinuation of HD and wished to be transitioned to comfort care.  Assessment & Plan:  Active Problems:   ESRD (end stage renal disease) ([place])   Palliative care by specialist   End of life care   Goals of care, counseling/discussion  End of life care. Palliative on board. TOC consulted to work on hospice facility placement.  Had some dyspnea overnight that was relieved with fentanyl Plan No lab draws Prn tylenol and fentanyl for pain and air hunger Prn ativan for anxiety Trazodone for sleep Continue duloxetine   Best practice:  CODE STATUS: DNR/DNI/COMFORT CARE Diet: reg DVT for prophylaxis: none Social considerations/Family communication: none Dispo:  Pending placement to [place], [place] PGY-1 PAGER #: [phone] 04/05/20  6:03 PM

## 2020-04-04 NOTE — Progress Notes (Signed)
NEW ADMISSION NOTE New Admission Note:   Arrival Method: Strecher bed Mental Orientation: Alert oriented x 4 Telemetry:#6 NSR Assessment: Completed Skin: Intact ,assessed with Anisha R.N. IV: Left hand -NSL Pain-Denies Safety Measures: Safety Fall Prevention Plan has been given, discussed and signed Admission: Completed [facility] Orientation: Patient has been orientated to the room, unit and staff.  Family: None at the bedside. Orders have been reviewed and implemented. Will continue to monitor the patient. Call light has been placed within reach and bed alarm has been activated.   [place], Zenon Mayo, RN

## 2020-04-04 NOTE — ED Provider Notes (Signed)
I spoke to internal medicine MD about admitting the patient directly from dialysis (he is there now), as there are no available hospice beds.  I consulted palliative care, they will see patient in next 24 hours.  Our social worker continues looking for bed placement at hospice.     Wyvonnia Dusky, MD 04/04/20 [phone]

## 2020-04-04 NOTE — Progress Notes (Signed)
[facility] ED-- Manufacturing engineer [facility]) RN note for Hospice Home  Received request from Madilyn Fireman, LCSW with [facility] team for patient interest in [facility] with request to transfer today. Chart reviewed and hospice home eligibility confirmed with [facility] physician.  Unfortunately, Hospice Home is not able to offer a room today. CSW aware that hospice liaison will follow up tomorrow or sooner if room becomes available.   Please call with any hospice related questions or concerns.  Thank you, Margaretmary [name], BSN, RN [place] [facility] [place]

## 2020-04-04 NOTE — ED Notes (Signed)
HD Bed 8 once covid results negative

## 2020-04-05 DIAGNOSIS — Z66 Do not resuscitate: Secondary | ICD-10-CM

## 2020-04-05 DIAGNOSIS — R0600 Dyspnea, unspecified: Secondary | ICD-10-CM

## 2020-04-05 MED ORDER — FENTANYL CITRATE (PF) 100 MCG/2ML IJ SOLN
25.0000 ug | INTRAMUSCULAR | Status: DC | PRN
  Administered 2020-04-05: 50 ug via INTRAVENOUS
  Administered 2020-04-05: 25 ug via INTRAVENOUS
  Administered 2020-04-05: 50 ug via INTRAVENOUS
  Administered 2020-04-05: 25 ug via INTRAVENOUS
  Administered 2020-04-06 (×3): 50 ug via INTRAVENOUS
  Administered 2020-04-06 – 2020-04-07 (×3): 25 ug via INTRAVENOUS
  Administered 2020-04-07: 50 ug via INTRAVENOUS
  Filled 2020-04-05 (×12): qty 2

## 2020-04-05 MED ORDER — TRAZODONE HCL 100 MG PO TABS
100.0000 mg | ORAL_TABLET | Freq: Every day | ORAL | Status: DC
  Administered 2020-04-05 – 2020-04-07 (×3): 100 mg via ORAL
  Filled 2020-04-05 (×3): qty 1

## 2020-04-05 NOTE — Progress Notes (Signed)
Palliative Medicine Inpatient Follow Up Note   HPI: Per intake H&P --> Fernando Jones is a 59 y.o. male with a pertinent past medical history of ESRD (TTS), HLD, HTN, CAD, T2DM. He presents today with progressive SOB and fatigue in the setting of having missed two recent HD treatments.   [name] is known to the palliative care team and discussions had taken place in February circulating around continuation of hemodialysis which at the time he wanted to continue doing.   Palliative care was asked to evaluate patient in the setting of transitioning to comfort measures as patient shared with the ER staff that he has declined and does not feel that he leads an acceptable quality of life.   Last night patient was initiated on comfort measures.  Today's Discussion (04/05/2020): Chart reviewed. Spoke to bedside staff who endorsed that the patient experienced some dyspnea overnight resolved with x2 doses of fentanyl. Patient also experienced insomnia. We discussed increased fentanyl dosing in addition to adding an agent for sleep. Nursing staff and patient are in agreement with this plan. [name] shares that he was able to speak with his sister and son yesterday. He shares that he has been treated very well. He did not have additional questions. We discussed that we are still waiting for a residential hospice bed.   Vital Signs Vitals:   04/05/20 0147 04/05/20 0520  BP: (!) 148/79 (!) 141/94  Pulse: (!) 107 (!) 104  Resp: 18 18  Temp: 98.4 F (36.9 C) 98.3 F (36.8 C)  SpO2: 98% 99%    Intake/Output Summary (Last 24 hours) at 04/05/2020 0734 Last data filed at 04/05/2020 0600 Gross per 24 hour  Intake 240 ml  Output 5000 ml  Net -4760 ml   Last Weight  Most recent update: 04/04/2020  1:00 PM   Weight  80 kg (176 lb 5.9 oz)           SUMMARY OF RECOMMENDATIONS DNAR/DNI  Comfort oriented care  Awaiting a Residential Hospice bed. [facility] had been consulted.  Code  Status/Advance Care Planning: DNAR/DNI  Recommendations for Symptom Management: Dyspnea: Pain                 - Fentanyl 12.5-25mcg IV Q1H PRN Fever:                 - Tylenol 650mg  PO Q6H PRN Agitation: Anxiety:                 - Lorazepam 0.5-1mg   IV  Q1H PRN Nausea:                 - Zofran 4mg  IV Q6H PRN    Secretions:                 - Glycopyrrolate 0.4mg  IV QID PRN Dry Eyes:                 - Artificial Tears PRN Xerostomia:                 - Biotene 21ml PRN                 - BID oral care Pruritis:                 - Benadryl PRN                 - Benadryl lotion Lotion Constipation:                 -  Bisacodyl 10mg  PR PRN QDay Insomnia:  - Trazodone 100mg  Po QHS Spiritual:                 - Chaplain consult  Time Spent: 25 Greater than 50% of the time was spent in counseling and coordination of care ______________________________________________________________________________________ [place] Team Team Cell Phone: [phone] Please utilize secure chat with additional questions, if there is no response within 30 minutes please call the above phone number  Palliative Medicine Team providers are available by phone from 7am to 7pm daily and can be reached through the team cell phone.  Should this patient require assistance outside of these hours, please call the patient's attending physician.

## 2020-04-06 NOTE — Progress Notes (Signed)
Pt was lying in bed resting when I arrived.  Our visit was brief as he noted several areas where he was having pain. He pointed to his leg, said his stomach and head hurt and he couldn't breathe. Please page if additional support is needed.   [place], [place]   04/06/20 1800  Clinical Encounter Type  Visited With Patient

## 2020-04-06 NOTE — Progress Notes (Signed)
   Subjective: Mr. Foglio reports that he is having some abodminal pain since yetserday, he reports that he feels like he has an urgency to urinate however is not able to urinate, reports some abdominal pain. He also has been having lower extremity shaking and pain. He reports that the pain medications helps however he reports that it doesn't last very long. He also reports some shortness of breath and difficulty breathing.   Objective:  Vital signs in last 24 hours: Vitals:   04/05/20 1043 04/05/20 2028 04/05/20 2316 04/06/20 0437  BP: (!) 157/93  124/85 139/71  Pulse: 98 (!) 111 (!) 102 97  Resp: 18 18  17   Temp: 98 F (36.7 C) 98.4 F (36.9 C)  97.6 F (36.4 C)  TempSrc: Oral Oral  Oral  SpO2: 98% 98%  95%  Weight:       General: Middle aged male, NAD, laying in bed Cardiac: RRR, systolc murmur Pulmonary: Bibasilar crackles, normal work of breathing Abdomen: Soft, TTP in suprapubic region  Assessment/Plan:  Active Problems:   ESRD (end stage renal disease) ([place])   Palliative care by specialist   End of life care   Goals of care, counseling/discussion   DNR (do not resuscitate)  This is a 59 year old male with history of ESRD on dialysis, hypertension, and anemia who presented with hyperkalemia and volume overload. He has been following with palliaitve care and has transitioned to comfort care.   End of life care: Patient has transitioned to end of life care. Palliative care is following and order set is in. Patient reports today that he is having shortness of breath, abdominal pain, and difficulty with urinattion. This could be due to urinary retention, will obtain bladder scan and do an in and out to relieve any pain from retention. We are working on transitioning him to hospice.   -Palliative care following, appreciate recommendations -Comfort care measures in -Bladder scan, in and out for pain relief -Fentanyl ordered -Continue home duloxetine -Social work following,  appreciate assistance with transitioning to hospice  Prior to Admission Living Arrangement: Home Anticipated Discharge Location: Hospice Barriers to Discharge: Hospice bed placement Dispo: Anticipated discharge is pending placement.   Asencion Noble, MD 04/06/2020, 6:50 AM Pager: [phone]

## 2020-04-06 NOTE — Plan of Care (Signed)
  Problem: Coping: Goal: Level of anxiety will decrease Outcome: Progressing   Problem: Pain Managment: Goal: General experience of comfort will improve Outcome: Progressing   

## 2020-04-07 DIAGNOSIS — N186 End stage renal disease: Secondary | ICD-10-CM

## 2020-04-07 DIAGNOSIS — Z7189 Other specified counseling: Secondary | ICD-10-CM

## 2020-04-07 DIAGNOSIS — Z5329 Procedure and treatment not carried out because of patient's decision for other reasons: Secondary | ICD-10-CM

## 2020-04-07 DIAGNOSIS — Z5320 Procedure and treatment not carried out because of patient's decision for unspecified reasons: Secondary | ICD-10-CM

## 2020-04-07 DIAGNOSIS — Z515 Encounter for palliative care: Secondary | ICD-10-CM

## 2020-04-07 MED ORDER — DIPHENHYDRAMINE-ZINC ACETATE 2-0.1 % EX CREA: TOPICAL_CREAM | Freq: Three times a day (TID) | CUTANEOUS | 0 refills | Status: AC | PRN

## 2020-04-07 MED ORDER — HYDROMORPHONE HCL 1 MG/ML IJ SOLN
0.5000 mg | INTRAMUSCULAR | Status: DC | PRN
  Administered 2020-04-07 – 2020-04-08 (×5): 0.5 mg via INTRAVENOUS
  Filled 2020-04-07 (×5): qty 1

## 2020-04-07 MED ORDER — HYDROMORPHONE HCL 2 MG PO TABS: 1.0000 mg | ORAL_TABLET | Freq: Four times a day (QID) | ORAL | 0 refills | Status: AC

## 2020-04-07 MED ORDER — HYDROMORPHONE HCL 2 MG PO TABS
1.0000 mg | ORAL_TABLET | Freq: Four times a day (QID) | ORAL | Status: DC
  Administered 2020-04-07 (×2): 1 mg via ORAL
  Filled 2020-04-07 (×3): qty 1

## 2020-04-07 MED ORDER — FENTANYL CITRATE (PF) 100 MCG/2ML IJ SOLN: 50.0000 ug | INTRAMUSCULAR | Status: DC | PRN

## 2020-04-07 MED ORDER — BISACODYL 10 MG RE SUPP: 10.0000 mg | Freq: Every day | RECTAL | 0 refills | Status: AC | PRN

## 2020-04-07 MED ORDER — DIPHENHYDRAMINE HCL 25 MG PO CAPS: 25.0000 mg | ORAL_CAPSULE | Freq: Three times a day (TID) | ORAL | 0 refills | Status: AC | PRN

## 2020-04-07 MED ORDER — BIOTENE DRY MOUTH MT LIQD: 15.0000 mL | OROMUCOSAL | 0 refills | Status: AC | PRN

## 2020-04-07 MED ORDER — HYPROMELLOSE (GONIOSCOPIC) 2.5 % OP SOLN: 1.0000 [drp] | Freq: Four times a day (QID) | OPHTHALMIC | 12 refills | Status: AC | PRN

## 2020-04-07 MED ORDER — TRAZODONE HCL 100 MG PO TABS: 100.0000 mg | ORAL_TABLET | Freq: Every day | ORAL | Status: AC

## 2020-04-07 MED ORDER — OXYCODONE HCL 5 MG PO TABS: 5.0000 mg | ORAL_TABLET | ORAL | Status: DC | PRN

## 2020-04-07 MED ORDER — GLYCOPYRROLATE 0.2 MG/ML IJ SOLN: 0.4000 mg | Freq: Four times a day (QID) | INTRAMUSCULAR | Status: AC | PRN

## 2020-04-07 MED ORDER — LORAZEPAM 2 MG/ML IJ SOLN: 0.5000 mg | INTRAMUSCULAR | 0 refills | Status: AC | PRN

## 2020-04-07 MED ORDER — ONDANSETRON HCL 4 MG/2ML IJ SOLN: 4.0000 mg | Freq: Four times a day (QID) | INTRAMUSCULAR | 0 refills | Status: AC | PRN

## 2020-04-07 MED ORDER — HYDROMORPHONE HCL 1 MG/ML IJ SOLN: 0.5000 mg | INTRAMUSCULAR | 0 refills | Status: AC | PRN

## 2020-04-07 NOTE — Plan of Care (Signed)
  Problem: Coping: Goal: Level of anxiety will decrease Outcome: Progressing   Problem: Pain Managment: Goal: General experience of comfort will improve Outcome: Progressing   

## 2020-04-07 NOTE — Progress Notes (Signed)
   NAME:  Fernando Jones, MRN:  [id number], DOB:  1960-12-09, LOS: 3 ADMISSION DATE:  04/04/2020   Brief History  59 yo gentleman with ESRD on HD who presented to [facility] on 04/04/20 requesting discontinuation of HD and wished to be transitioned to comfort care. Pt was transitioned to comfort care and TOC consulted for placement in residential hospice.  Subjective/Interm history  No overnight events. Pt notes progressive abdominal discomfort and feels like he needs to urinate. He also is noting increased cramping pain in his legs. We discussed that this is likely progression of his uremia and measures we can take to alleviate this. We also chatted about the plan for placement in residential hospice pending bed availability.  Significant Hospital Events   4/30 hospital admission; transitioned to comfort care; palliative care consulted 5/1 some abdominal discomfort and difficulty taking deep breaths. TOC working on hospice facility discharge  5/2 progressive abd discomfort/shortness of breath, lower extremity shaking 5/3 still awaiting bed placement  Objective   Blood pressure (!) 168/84, pulse (!) 102, temperature 97.8 F (36.6 C), temperature source Oral, resp. rate 14, weight 80 kg, SpO2 97 %.     Intake/Output Summary (Last 24 hours) at 04/07/2020 1019 Last data filed at 04/07/2020 0900 Gross per 24 hour  Intake 870 ml  Output 75 ml  Net 795 ml    Examination: GENERAL: in no acute distress PULMONARY: breathing comfortably on nasal canula Extremities: some pain with palpation of his calves. No edema. NEURO: alert and oriented.   Consults:  Palliative  Summary  59 yo male with ESRD on HD who presented to [facility] on 4/30 requesting discontinuation of HD and wished to be transitioned to comfort care.  Assessment & Plan:  Active Problems:   ESRD (end stage renal disease) ([place])   Palliative care by specialist   End of life care   Encounter for end of life care   DNR (do not  resuscitate)  End of life care. Palliative on board. TOC consulted to work on hospice facility placement.  Plan No lab draws Prn oxycodone 5mg  and prn fentynl for pain relief Post void scan. Place foley for comfort if retaining Prn ativan for anxiety Trazodone for sleep Continue duloxetine   Best practice:  CODE STATUS: DNR/DNI/COMFORT CARE Diet: reg DVT for prophylaxis: none Social considerations/Family communication: none Dispo:  Pending placement to [place], MD [place] PGY-1 PAGER #: [phone] 04/07/20  10:19 AM

## 2020-04-07 NOTE — Progress Notes (Signed)
Palliative Medicine RN Note: Spoke w [name], liaison for [facility], aka [facility] (previously Hosp[facility] and [place]). [name] is waiting on a bed at their [facility]. She will check if there is a bed available today and call me back.  Fernando Jones Fernando Bartha, RN, BSN, [facility] Palliative Medicine Team 04/07/2020 10:40 AM Office [phone]

## 2020-04-07 NOTE — TOC Transition Note (Addendum)
Transition of Care [facility]) - CM/SW Discharge Note   Patient Details  Name: Lester Crickenberger MRN: [id number] Date of Birth: [birth year]  Transition of Care [facility]) CM/SW Contact:  Bartholomew Crews, RN Phone Number: [phone] 04/07/2020, 12:03 PM   Clinical Narrative:     14:00 Spoke with patient at the bedside using Language Line interpreter to review admission paperwork for [employer]. Electronic signatures completed and copies of paperwork provided to patient. Spoke with Education officer, museum at [place] on speaker phone with patient to discuss further logistics. Patient verbalized no questions, and expressed appreciation for assistance. DC summary faxed to [facility]. DNR signed by MD. Corey Harold arranged for 5pm transport. Nurse aware to call report to [facility].  12:00  Notified by liaison at [facility] of bed availability this evening after 5 pm. Discussed NCM assisting patient with admission paperwork. Spoke with patient at the bedside, patient has his cell phone. Verified number and provided to [facility]. Patient asked NCM to touchbase with his friend, Brantley Persons, to update him on patient transition. MD notified - requested DC summary and signed DNR. Will arrange PTAR when admission details resolved.   No further TOC needs identified at this time..   Final next level of care: [place] Barriers to Discharge: No Barriers Identified   Patient Goals and CMS Choice Patient states their goals for this hospitalization and ongoing recovery are:: to stop dialysis and be comfortable CMS Medicare.gov Compare Post Acute Care list provided to:: Patient Choice offered to / list presented to : Patient  Discharge Placement                       Discharge Plan and Services                DME Arranged: N/A DME Agency: NA       HH Arranged: NA HH Agency: NA        Social Determinants of Health (SDOH) Interventions     Readmission Risk  Interventions No flowsheet data found.

## 2020-04-07 NOTE — Progress Notes (Addendum)
Manufacturing engineer [facility]) Hospital Liaison Note:  Spoke with patient via telephone to confirm interest, answer questions and offer bed to [facility] with transfer today. Patient stated "his English was not good and wanted to speak through the [facility] SW". Then spoke with Fransisco Hertz, [facility]  TOC who confirmed patient is able to transfer today and will assist patient with Hospice registration paperwork via Docusign with [place] SW this afternoon.   Please fax discharge summary to [phone] and arrange transport to facility for after 5pm today.  Please have RN call report to [phone].  Thank you,  Gar Ponto, RN ACC HLT (on amion) [phone]  UPDATE AT 1350.Marland KitchenMarland KitchenMarland KitchenACC SW confirmed that [place] is complete with the assistance of [facility] SW (Thank you!). Per ACC SW, patient was able to use Spanish registration forms so patient was able to fully understand consents/registeration to [facility]. Patient is ready for transfer to facility once transportation can be arranged by [facility] TOC for patient to arrive after 5pm.

## 2020-04-07 NOTE — Progress Notes (Signed)
Daily Progress Note   Patient Name: Fernando Jones       Date: 04/07/2020 DOB: 11/21/1961  Age: 59 y.o. MRN#: [id number] Attending Physician: Sid Falcon, MD Primary Care Physician: Kerin Perna, NP Admit Date: 04/04/2020  Reason for Consultation/Follow-up: Symptom management related to patient declining dialysis at end of life  Subjective: Fernando Jones is awake and alert. He tells me he has ongoing pain in his abdomen and legs. His back is itching.  He feels he is retaining urine- bladder scan with 12 mLs- he is likely not making much urine at this point. We discussed transitioning his pain regimen and he is in agreement. He confirms his main goal is comfort, pain relief and relief from shortness of breath.   Review of Systems  Constitutional: Positive for malaise/fatigue.  Skin: Positive for itching. Negative for rash.    Length of Stay: 3  Current Medications: Scheduled Meds:  . Chlorhexidine Gluconate Cloth  6 each Topical Q0600  . DULoxetine  30 mg Oral Daily  . HYDROmorphone  1 mg Oral Q6H  . rOPINIRole  0.25 mg Oral QHS  . sodium chloride flush  3 mL Intravenous Once  . traZODone  100 mg Oral QHS    Continuous Infusions:   PRN Meds: acetaminophen, antiseptic oral rinse, bisacodyl, diphenhydrAMINE, diphenhydrAMINE-zinc acetate, glycopyrrolate, HYDROmorphone (DILAUDID) injection, hydroxypropyl methylcellulose / hypromellose, LORazepam, ondansetron (ZOFRAN) IV, pentafluoroprop-tetrafluoroeth  Physical Exam Vitals and nursing note reviewed.  Constitutional:      Appearance: He is well-developed.  Cardiovascular:     Rate and Rhythm: Normal rate.     Pulses: Normal pulses.  Pulmonary:     Effort: Pulmonary effort is normal.  Musculoskeletal:     Right  lower leg: Edema present.     Left lower leg: Edema present.  Neurological:     Mental Status: He is alert and oriented to person, place, and time.  Psychiatric:        Mood and Affect: Mood normal.        Behavior: Behavior normal.             Vital Signs: BP (!) 168/84 (BP Location: Left Arm)   Pulse (!) 102   Temp 97.8 F (36.6 C) (Oral)   Resp 14   Wt 80 kg   SpO2 97%  BMI 30.27 kg/m  SpO2: SpO2: 97 % O2 Device: O2 Device: Nasal Cannula O2 Flow Rate: O2 Flow Rate (L/min): 2 L/min  Intake/output summary:   Intake/Output Summary (Last 24 hours) at 04/07/2020 1109 Last data filed at 04/07/2020 0900 Gross per 24 hour  Intake 870 ml  Output 50 ml  Net 820 ml   LBM: Last BM Date: 04/03/20 Baseline Weight: Weight: (couldn't get weight d/t pt unable to stand and pt on stretch) Most recent weight: Weight: (couldn't obtain d/t pt unable to stand/pt on stretcher-ED)       Palliative Assessment/Data: PPS: 40% but expect to rapidly decline due to stopping dialysis     Patient Active Problem List   Diagnosis Date Noted  . DNR (do not resuscitate)   . Palliative care by specialist   . End of life care   . Encounter for end of life care   . Abdominal pain   . Palliative care encounter   . Right arm pain 01/11/2020  . Cervical radiculopathy 01/11/2020  . Chest pain 10/14/2018  . Hyperkalemia 06/29/2018  . Malnutrition of moderate degree 12/15/2017  . ESRD (end stage renal disease) ([place]) 12/06/2017  . Severe Vitamin D deficiency 10/14/2016  . Diastolic dysfunction with acute on chronic heart failure ([place]) 10/13/2016  . Acute respiratory failure with hypoxia ([place]) 10/12/2016  . Anasarca 10/12/2016  . Acute renal failure (ARF) ([place]) 10/12/2016  . Hypertensive urgency 10/12/2016  . Acute respiratory failure ([place]) 10/12/2016  . ERECTILE DYSFUNCTION, NON-ORGANIC 06/23/2009  . DM (diabetes mellitus), type 2 with renal complications ([place]) 63/14/9702  . HYPERLIPIDEMIA  05/20/2009  . TOBACCO ABUSE 05/20/2009  . Essential hypertension 05/20/2009  . CONSTIPATION 05/20/2009    Palliative Care Assessment & Plan   Patient Profile: 59 yo male with past medical history or HLD, HTN, CAD, T2DM, ESRD on dialysis 3 days per week. Admitted on 04/04/2020 with complaints of SOB and fatigue in the setting of missing dialysis treatments. Goals of care were discussed and patient has transitioned to full comfort care measures only. Awaiting placement at [facility]   Assessment/Recommendations/Plan   Will transition from fentanyl to hydromorphone for pain and shortness of breath control- 1mg  po hydromorphone q6hr PO scheduled- and .5mg  hydromorphone IV q1hr prn for pain, SOB  Requested RN administer diphenhydramine for itching  In and out cath PRN  Goals of Care and Additional Recommendations:  Limitations on Scope of Treatment: Full Comfort Care  Code Status:  DNR  Prognosis:   < 2 weeks due to ESRD and stopping dialysis  Discharge Planning:  Hospice facility  Care plan was discussed with patient and RN.  Thank you for allowing the Palliative Medicine Team to assist in the care of this patient.   Time In: 1050 Time Out: 1125 Total Time 25 mins Prolonged Time Billed no      Greater than 50%  of this time was spent counseling and coordinating care related to the above assessment and plan.  Mariana Kaufman, AGNP-C Palliative Medicine   Please contact Palliative Medicine Team phone at [phone] for questions and concerns.

## 2020-04-07 NOTE — Discharge Summary (Signed)
Name: Fernando Jones MRN: [id number] DOB: 12-Apr-1961 59 y.o. PCP: Kerin Perna, NP  Date of Admission: 04/04/2020  8:13 AM Date of Discharge:  Attending Physician: Sid Falcon, MD  Discharge Diagnosis: 1. End of life care 2. ESRD on HD  Discharge Medications: Allergies as of 04/07/2020   No Known Allergies     Medication List    STOP taking these medications   acetaminophen 500 MG tablet Commonly known as: TYLENOL   aspirin 81 MG EC tablet   atorvastatin 40 MG tablet Commonly known as: LIPITOR   Basaglar KwikPen 100 UNIT/ML   cinacalcet 60 MG tablet Commonly known as: SENSIPAR   ferric citrate 1 GM 210 MG(Fe) tablet Commonly known as: AURYXIA   losartan 50 MG tablet Commonly known as: COZAAR   ondansetron 4 MG tablet Commonly known as: Zofran Replaced by: ondansetron 4 MG/2ML Soln injection   pantoprazole 40 MG tablet Commonly known as: PROTONIX     TAKE these medications   antiseptic oral rinse Liqd 15 mLs by Mouth Rinse route as needed for dry mouth.   bisacodyl 10 MG suppository Commonly known as: DULCOLAX Place 1 suppository (10 mg total) rectally daily as needed (Constipation).   diphenhydrAMINE 25 mg capsule Commonly known as: BENADRYL Take 1 capsule (25 mg total) by mouth every 8 (eight) hours as needed for itching.   diphenhydrAMINE-zinc acetate cream Commonly known as: BENADRYL Apply topically 3 (three) times daily as needed for itching.   DULoxetine 30 MG capsule Commonly known as: CYMBALTA Take 1 capsule (30 mg total) by mouth daily.   gabapentin 600 MG tablet Commonly known as: NEURONTIN Take 0.5 tablets (300 mg total) by mouth daily.   glycopyrrolate 0.2 MG/ML injection Commonly known as: ROBINUL Inject 2 mLs (0.4 mg total) into the vein 4 (four) times daily as needed (Secretions).   HYDROmorphone 2 MG tablet Commonly known as: DILAUDID Take 0.5 tablets (1 mg total) by mouth every 6 (six) hours.     HYDROmorphone 1 MG/ML injection Commonly known as: DILAUDID Inject 0.5 mLs (0.5 mg total) into the vein every hour as needed (breakthrough pain, severe pain, shortness of breath).   hydroxypropyl methylcellulose / hypromellose 2.5 % ophthalmic solution Commonly known as: ISOPTO TEARS / GONIOVISC Place 1 drop into both eyes 4 (four) times daily as needed for dry eyes.   LORazepam 2 MG/ML injection Commonly known as: ATIVAN Inject 0.25-0.5 mLs (0.5-1 mg total) into the vein every hour as needed for anxiety, seizure or sedation.   ondansetron 4 MG/2ML Soln injection Commonly known as: ZOFRAN Inject 2 mLs (4 mg total) into the vein every 6 (six) hours as needed for nausea or vomiting. Replaces: ondansetron 4 MG tablet   polyethylene glycol 17 g packet Commonly known as: MIRALAX / GLYCOLAX Take 17 g by mouth daily.   rOPINIRole 0.25 MG tablet Commonly known as: REQUIP Take 0.25 mg by mouth at bedtime.   traZODone 100 MG tablet Commonly known as: DESYREL Take 1 tablet (100 mg total) by mouth at bedtime.       Disposition and follow-up:   Mr.Fernando Jones was discharged from [facility] in Stable condition.  At the hospital follow up visit please address:  1.  End of Life Care: please ensure that his pain is adequately controlled. Discharge for end of life care to [facility] (previously hospice and palliative care [place])   2.  Labs / imaging needed at time of follow-up: none  3.  Pending labs/ test needing follow-up: none  Follow-up Appointments: none   Hospital Course: 59 yo gentleman with HD dependent ESRD who presented to [facility] on 04/04/20 requesting withdrawal from HD and transition to comfort care. Admission team deemed him to have mental capacity to make this decision and ensured that he understood consequences of cessation of HD including death.  Palliative care was consulted. He was transitioned to comfort care at that time.  Since then, he has remained hospitalized while awaiting bed placement at a hospice facility. A bed became available at [facility] on 5/3 and he was subsequently discharged to the facility.  Social Hx: majority of his family still resides in [place] including his wife and child however he does have a sister that lives in the states that communicates with them.  Discharge Vitals:   BP (!) 168/84 (BP Location: Left Arm)   Pulse (!) 102   Temp 97.8 F (36.6 C) (Oral)   Resp 14   Wt 80 kg   SpO2 97%   BMI 30.27 kg/m   Pertinent Labs, Studies, and Procedures:  n/a   SignedMitzi Hansen, MD 04/07/2020, 12:07 PM   Pager: [phone]

## 2020-04-08 ENCOUNTER — Ambulatory Visit (INDEPENDENT_AMBULATORY_CARE_PROVIDER_SITE_OTHER): Payer: Self-pay | Admitting: Family Medicine

## 2020-04-08 NOTE — Progress Notes (Signed)
Patient discharged to [facility] transported by [facility]. Belongings sent with patient. Report already called by Carla,RN. Atharv Barriere, Wonda Cheng, Therapist, sports

## 2020-04-09 ENCOUNTER — Ambulatory Visit (INDEPENDENT_AMBULATORY_CARE_PROVIDER_SITE_OTHER): Payer: Self-pay | Admitting: Primary Care
